# Patient Record
Sex: Female | Born: 1935 | Race: White | Hispanic: No | Marital: Single | Smoking: Former smoker
Health system: Southern US, Community
[De-identification: ages and names within clinical notes are randomized; demographics above are authoritative.]

## PROBLEM LIST (undated history)

## (undated) DIAGNOSIS — I509 Heart failure, unspecified: Secondary | ICD-10-CM

## (undated) DIAGNOSIS — F03918 Unspecified dementia, unspecified severity, with other behavioral disturbance: Secondary | ICD-10-CM

## (undated) DIAGNOSIS — E876 Hypokalemia: Secondary | ICD-10-CM

## (undated) DIAGNOSIS — K219 Gastro-esophageal reflux disease without esophagitis: Secondary | ICD-10-CM

## (undated) DIAGNOSIS — J9600 Acute respiratory failure, unspecified whether with hypoxia or hypercapnia: Secondary | ICD-10-CM

## (undated) DIAGNOSIS — J90 Pleural effusion, not elsewhere classified: Secondary | ICD-10-CM

## (undated) DIAGNOSIS — I4891 Unspecified atrial fibrillation: Secondary | ICD-10-CM

## (undated) DIAGNOSIS — Z9889 Other specified postprocedural states: Secondary | ICD-10-CM

## (undated) DIAGNOSIS — F29 Unspecified psychosis not due to a substance or known physiological condition: Secondary | ICD-10-CM

## (undated) DIAGNOSIS — E46 Unspecified protein-calorie malnutrition: Secondary | ICD-10-CM

## (undated) DIAGNOSIS — I499 Cardiac arrhythmia, unspecified: Secondary | ICD-10-CM

## (undated) DIAGNOSIS — Z86718 Personal history of other venous thrombosis and embolism: Secondary | ICD-10-CM

## (undated) DIAGNOSIS — A419 Sepsis, unspecified organism: Secondary | ICD-10-CM

## (undated) DIAGNOSIS — J449 Chronic obstructive pulmonary disease, unspecified: Secondary | ICD-10-CM

## (undated) DIAGNOSIS — G9341 Metabolic encephalopathy: Secondary | ICD-10-CM

## (undated) DIAGNOSIS — N390 Urinary tract infection, site not specified: Secondary | ICD-10-CM

## (undated) DIAGNOSIS — F0391 Unspecified dementia with behavioral disturbance: Secondary | ICD-10-CM

## (undated) DIAGNOSIS — E785 Hyperlipidemia, unspecified: Secondary | ICD-10-CM

## (undated) DIAGNOSIS — M1990 Unspecified osteoarthritis, unspecified site: Secondary | ICD-10-CM

## (undated) DIAGNOSIS — F0390 Unspecified dementia without behavioral disturbance: Secondary | ICD-10-CM

## (undated) DIAGNOSIS — D638 Anemia in other chronic diseases classified elsewhere: Secondary | ICD-10-CM

## (undated) DIAGNOSIS — J189 Pneumonia, unspecified organism: Secondary | ICD-10-CM

## (undated) HISTORY — DX: Metabolic encephalopathy: G93.41

## (undated) HISTORY — DX: Hypokalemia: E87.6

## (undated) HISTORY — DX: Cardiac arrhythmia, unspecified: I49.9

## (undated) HISTORY — DX: Acute respiratory failure, unspecified whether with hypoxia or hypercapnia: J96.00

## (undated) HISTORY — DX: Urinary tract infection, site not specified: N39.0

## (undated) HISTORY — PX: GALLBLADDER SURGERY: SHX652

## (undated) HISTORY — DX: Unspecified dementia with behavioral disturbance: F03.91

## (undated) HISTORY — DX: Personal history of other venous thrombosis and embolism: Z86.718

## (undated) HISTORY — PX: HIP SURGERY: SHX245

## (undated) HISTORY — DX: Heart failure, unspecified: I50.9

## (undated) HISTORY — DX: Unspecified osteoarthritis, unspecified site: M19.90

## (undated) HISTORY — DX: Unspecified atrial fibrillation: I48.91

## (undated) HISTORY — DX: Sepsis, unspecified organism: A41.9

## (undated) HISTORY — DX: Unspecified psychosis not due to a substance or known physiological condition: F29

## (undated) HISTORY — DX: Hyperlipidemia, unspecified: E78.5

## (undated) HISTORY — DX: Unspecified protein-calorie malnutrition: E46

## (undated) HISTORY — PX: APPENDECTOMY: SHX54

## (undated) HISTORY — DX: Unspecified dementia, unspecified severity, without behavioral disturbance, psychotic disturbance, mood disturbance, and anxiety: F03.90

## (undated) HISTORY — DX: Gastro-esophageal reflux disease without esophagitis: K21.9

## (undated) HISTORY — DX: Chronic obstructive pulmonary disease, unspecified: J44.9

## (undated) HISTORY — DX: Anemia in other chronic diseases classified elsewhere: D63.8

## (undated) HISTORY — DX: Unspecified dementia, unspecified severity, with other behavioral disturbance: F03.918

## (undated) HISTORY — PX: LEG SURGERY: SHX1003

## (undated) HISTORY — DX: Pneumonia, unspecified organism: J18.9

---

## 2010-02-24 DIAGNOSIS — R799 Abnormal finding of blood chemistry, unspecified: Secondary | ICD-10-CM | POA: Insufficient documentation

## 2010-02-24 DIAGNOSIS — R7309 Other abnormal glucose: Secondary | ICD-10-CM | POA: Insufficient documentation

## 2010-10-08 DIAGNOSIS — M810 Age-related osteoporosis without current pathological fracture: Secondary | ICD-10-CM | POA: Insufficient documentation

## 2010-11-02 ENCOUNTER — Emergency Department (INDEPENDENT_AMBULATORY_CARE_PROVIDER_SITE_OTHER): Payer: Medicare Other

## 2010-11-02 ENCOUNTER — Emergency Department (HOSPITAL_BASED_OUTPATIENT_CLINIC_OR_DEPARTMENT_OTHER)
Admission: EM | Admit: 2010-11-02 | Discharge: 2010-11-02 | Disposition: A | Discharge status: Other Acute Inpatient Hospital | Payer: Medicare Other | Attending: Emergency Medicine | Admitting: Emergency Medicine

## 2010-11-02 DIAGNOSIS — R112 Nausea with vomiting, unspecified: Secondary | ICD-10-CM

## 2010-11-02 DIAGNOSIS — R109 Unspecified abdominal pain: Secondary | ICD-10-CM | POA: Insufficient documentation

## 2010-11-02 DIAGNOSIS — Z79899 Other long term (current) drug therapy: Secondary | ICD-10-CM | POA: Insufficient documentation

## 2010-11-02 DIAGNOSIS — N390 Urinary tract infection, site not specified: Secondary | ICD-10-CM | POA: Insufficient documentation

## 2010-11-02 DIAGNOSIS — R197 Diarrhea, unspecified: Secondary | ICD-10-CM

## 2010-11-02 DIAGNOSIS — I4891 Unspecified atrial fibrillation: Secondary | ICD-10-CM | POA: Insufficient documentation

## 2010-11-02 DIAGNOSIS — R141 Gas pain: Secondary | ICD-10-CM

## 2010-11-02 DIAGNOSIS — D3500 Benign neoplasm of unspecified adrenal gland: Secondary | ICD-10-CM

## 2010-11-02 DIAGNOSIS — K56609 Unspecified intestinal obstruction, unspecified as to partial versus complete obstruction: Secondary | ICD-10-CM

## 2010-11-02 LAB — URINALYSIS, ROUTINE W REFLEX MICROSCOPIC
Bilirubin Urine: NEGATIVE
Glucose, UA: NEGATIVE mg/dL
Nitrite: POSITIVE — AB
Specific Gravity, Urine: 1.018 (ref 1.005–1.030)
pH: 7.5 (ref 5.0–8.0)

## 2010-11-02 LAB — CBC
MCHC: 33.4 g/dL (ref 30.0–36.0)
Platelets: 319 10*3/uL (ref 150–400)
RDW: 16.5 % — ABNORMAL HIGH (ref 11.5–15.5)
WBC: 12.2 10*3/uL — ABNORMAL HIGH (ref 4.0–10.5)

## 2010-11-02 LAB — URINE MICROSCOPIC-ADD ON

## 2010-11-02 LAB — COMPREHENSIVE METABOLIC PANEL
ALT: 21 U/L (ref 0–35)
AST: 38 U/L — ABNORMAL HIGH (ref 0–37)
Albumin: 3.1 g/dL — ABNORMAL LOW (ref 3.5–5.2)
Alkaline Phosphatase: 95 U/L (ref 39–117)
Calcium: 9.4 mg/dL (ref 8.4–10.5)
Potassium: 4.6 mEq/L (ref 3.5–5.1)
Sodium: 133 mEq/L — ABNORMAL LOW (ref 135–145)
Total Protein: 7.7 g/dL (ref 6.0–8.3)

## 2010-11-02 LAB — DIFFERENTIAL
Basophils Absolute: 0 10*3/uL (ref 0.0–0.1)
Basophils Relative: 0 % (ref 0–1)
Eosinophils Relative: 0 % (ref 0–5)
Lymphocytes Relative: 14 % (ref 12–46)
Monocytes Absolute: 0.9 10*3/uL (ref 0.1–1.0)

## 2010-11-02 LAB — LIPASE, BLOOD: Lipase: 10 U/L — ABNORMAL LOW (ref 11–59)

## 2010-11-02 MED ORDER — IOHEXOL 300 MG/ML  SOLN
100.0000 mL | Freq: Once | INTRAMUSCULAR | Status: AC | PRN
  Administered 2010-11-02: 100 mL via INTRAVENOUS

## 2011-06-21 DIAGNOSIS — F331 Major depressive disorder, recurrent, moderate: Secondary | ICD-10-CM | POA: Diagnosis not present

## 2011-06-21 DIAGNOSIS — F0390 Unspecified dementia without behavioral disturbance: Secondary | ICD-10-CM | POA: Diagnosis not present

## 2011-06-21 DIAGNOSIS — F29 Unspecified psychosis not due to a substance or known physiological condition: Secondary | ICD-10-CM | POA: Diagnosis not present

## 2011-06-22 DIAGNOSIS — S72143A Displaced intertrochanteric fracture of unspecified femur, initial encounter for closed fracture: Secondary | ICD-10-CM | POA: Diagnosis not present

## 2011-06-22 DIAGNOSIS — S32409A Unspecified fracture of unspecified acetabulum, initial encounter for closed fracture: Secondary | ICD-10-CM | POA: Diagnosis not present

## 2011-06-30 DIAGNOSIS — M6281 Muscle weakness (generalized): Secondary | ICD-10-CM | POA: Diagnosis not present

## 2011-06-30 DIAGNOSIS — Z5189 Encounter for other specified aftercare: Secondary | ICD-10-CM | POA: Diagnosis not present

## 2011-07-02 DIAGNOSIS — F0391 Unspecified dementia with behavioral disturbance: Secondary | ICD-10-CM | POA: Diagnosis not present

## 2011-07-02 DIAGNOSIS — I4891 Unspecified atrial fibrillation: Secondary | ICD-10-CM | POA: Diagnosis not present

## 2011-07-02 DIAGNOSIS — Z7901 Long term (current) use of anticoagulants: Secondary | ICD-10-CM | POA: Diagnosis not present

## 2011-07-02 DIAGNOSIS — M1990 Unspecified osteoarthritis, unspecified site: Secondary | ICD-10-CM | POA: Diagnosis not present

## 2011-07-02 DIAGNOSIS — K219 Gastro-esophageal reflux disease without esophagitis: Secondary | ICD-10-CM | POA: Diagnosis not present

## 2011-07-08 DIAGNOSIS — Z79899 Other long term (current) drug therapy: Secondary | ICD-10-CM | POA: Diagnosis not present

## 2011-07-20 DIAGNOSIS — Z7901 Long term (current) use of anticoagulants: Secondary | ICD-10-CM | POA: Diagnosis not present

## 2011-07-20 DIAGNOSIS — I4891 Unspecified atrial fibrillation: Secondary | ICD-10-CM | POA: Diagnosis not present

## 2011-07-20 DIAGNOSIS — M1990 Unspecified osteoarthritis, unspecified site: Secondary | ICD-10-CM | POA: Diagnosis not present

## 2011-07-20 DIAGNOSIS — K219 Gastro-esophageal reflux disease without esophagitis: Secondary | ICD-10-CM | POA: Diagnosis not present

## 2011-07-20 DIAGNOSIS — F0391 Unspecified dementia with behavioral disturbance: Secondary | ICD-10-CM | POA: Diagnosis not present

## 2011-08-02 DIAGNOSIS — F29 Unspecified psychosis not due to a substance or known physiological condition: Secondary | ICD-10-CM | POA: Diagnosis not present

## 2011-08-02 DIAGNOSIS — F331 Major depressive disorder, recurrent, moderate: Secondary | ICD-10-CM | POA: Diagnosis not present

## 2011-08-02 DIAGNOSIS — F0390 Unspecified dementia without behavioral disturbance: Secondary | ICD-10-CM | POA: Diagnosis not present

## 2011-08-03 DIAGNOSIS — F0391 Unspecified dementia with behavioral disturbance: Secondary | ICD-10-CM | POA: Diagnosis not present

## 2011-08-03 DIAGNOSIS — I4891 Unspecified atrial fibrillation: Secondary | ICD-10-CM | POA: Diagnosis not present

## 2011-08-03 DIAGNOSIS — Z7901 Long term (current) use of anticoagulants: Secondary | ICD-10-CM | POA: Diagnosis not present

## 2011-08-03 DIAGNOSIS — K219 Gastro-esophageal reflux disease without esophagitis: Secondary | ICD-10-CM | POA: Diagnosis not present

## 2011-08-06 DIAGNOSIS — Z7901 Long term (current) use of anticoagulants: Secondary | ICD-10-CM | POA: Diagnosis not present

## 2011-08-06 DIAGNOSIS — I4891 Unspecified atrial fibrillation: Secondary | ICD-10-CM | POA: Diagnosis not present

## 2011-08-10 DIAGNOSIS — Z7901 Long term (current) use of anticoagulants: Secondary | ICD-10-CM | POA: Diagnosis not present

## 2011-08-10 DIAGNOSIS — I4891 Unspecified atrial fibrillation: Secondary | ICD-10-CM | POA: Diagnosis not present

## 2011-08-17 DIAGNOSIS — I4891 Unspecified atrial fibrillation: Secondary | ICD-10-CM | POA: Diagnosis not present

## 2011-08-17 DIAGNOSIS — Z7901 Long term (current) use of anticoagulants: Secondary | ICD-10-CM | POA: Diagnosis not present

## 2011-08-20 DIAGNOSIS — Z7901 Long term (current) use of anticoagulants: Secondary | ICD-10-CM | POA: Diagnosis not present

## 2011-08-20 DIAGNOSIS — I4891 Unspecified atrial fibrillation: Secondary | ICD-10-CM | POA: Diagnosis not present

## 2011-08-24 DIAGNOSIS — I4891 Unspecified atrial fibrillation: Secondary | ICD-10-CM | POA: Diagnosis not present

## 2011-08-24 DIAGNOSIS — Z7901 Long term (current) use of anticoagulants: Secondary | ICD-10-CM | POA: Diagnosis not present

## 2011-08-31 DIAGNOSIS — M1990 Unspecified osteoarthritis, unspecified site: Secondary | ICD-10-CM | POA: Diagnosis not present

## 2011-08-31 DIAGNOSIS — I4891 Unspecified atrial fibrillation: Secondary | ICD-10-CM | POA: Diagnosis not present

## 2011-08-31 DIAGNOSIS — Z7901 Long term (current) use of anticoagulants: Secondary | ICD-10-CM | POA: Diagnosis not present

## 2011-08-31 DIAGNOSIS — F0391 Unspecified dementia with behavioral disturbance: Secondary | ICD-10-CM | POA: Diagnosis not present

## 2011-08-31 DIAGNOSIS — K219 Gastro-esophageal reflux disease without esophagitis: Secondary | ICD-10-CM | POA: Diagnosis not present

## 2011-09-07 DIAGNOSIS — I4891 Unspecified atrial fibrillation: Secondary | ICD-10-CM | POA: Diagnosis not present

## 2011-09-07 DIAGNOSIS — Z7901 Long term (current) use of anticoagulants: Secondary | ICD-10-CM | POA: Diagnosis not present

## 2011-09-07 DIAGNOSIS — F064 Anxiety disorder due to known physiological condition: Secondary | ICD-10-CM | POA: Diagnosis not present

## 2011-09-09 DIAGNOSIS — Z7901 Long term (current) use of anticoagulants: Secondary | ICD-10-CM | POA: Diagnosis not present

## 2011-09-09 DIAGNOSIS — I4891 Unspecified atrial fibrillation: Secondary | ICD-10-CM | POA: Diagnosis not present

## 2011-09-14 DIAGNOSIS — I4891 Unspecified atrial fibrillation: Secondary | ICD-10-CM | POA: Diagnosis not present

## 2011-09-14 DIAGNOSIS — Z7901 Long term (current) use of anticoagulants: Secondary | ICD-10-CM | POA: Diagnosis not present

## 2011-09-17 DIAGNOSIS — Z7901 Long term (current) use of anticoagulants: Secondary | ICD-10-CM | POA: Diagnosis not present

## 2011-09-17 DIAGNOSIS — I4891 Unspecified atrial fibrillation: Secondary | ICD-10-CM | POA: Diagnosis not present

## 2011-09-21 DIAGNOSIS — I4891 Unspecified atrial fibrillation: Secondary | ICD-10-CM | POA: Diagnosis not present

## 2011-09-21 DIAGNOSIS — Z7901 Long term (current) use of anticoagulants: Secondary | ICD-10-CM | POA: Diagnosis not present

## 2011-09-24 DIAGNOSIS — Z961 Presence of intraocular lens: Secondary | ICD-10-CM | POA: Diagnosis not present

## 2011-09-24 DIAGNOSIS — H02839 Dermatochalasis of unspecified eye, unspecified eyelid: Secondary | ICD-10-CM | POA: Diagnosis not present

## 2011-09-24 DIAGNOSIS — H5210 Myopia, unspecified eye: Secondary | ICD-10-CM | POA: Diagnosis not present

## 2011-09-24 DIAGNOSIS — H43819 Vitreous degeneration, unspecified eye: Secondary | ICD-10-CM | POA: Diagnosis not present

## 2011-09-24 DIAGNOSIS — H353190 Nonexudative age-related macular degeneration, unspecified eye, stage unspecified: Secondary | ICD-10-CM | POA: Diagnosis not present

## 2011-09-24 DIAGNOSIS — H524 Presbyopia: Secondary | ICD-10-CM | POA: Diagnosis not present

## 2011-09-24 DIAGNOSIS — H52209 Unspecified astigmatism, unspecified eye: Secondary | ICD-10-CM | POA: Diagnosis not present

## 2011-09-28 DIAGNOSIS — I4891 Unspecified atrial fibrillation: Secondary | ICD-10-CM | POA: Diagnosis not present

## 2011-09-28 DIAGNOSIS — Z7901 Long term (current) use of anticoagulants: Secondary | ICD-10-CM | POA: Diagnosis not present

## 2011-10-04 DIAGNOSIS — F331 Major depressive disorder, recurrent, moderate: Secondary | ICD-10-CM | POA: Diagnosis not present

## 2011-10-04 DIAGNOSIS — F0390 Unspecified dementia without behavioral disturbance: Secondary | ICD-10-CM | POA: Diagnosis not present

## 2011-10-04 DIAGNOSIS — F29 Unspecified psychosis not due to a substance or known physiological condition: Secondary | ICD-10-CM | POA: Diagnosis not present

## 2011-10-05 DIAGNOSIS — I4891 Unspecified atrial fibrillation: Secondary | ICD-10-CM | POA: Diagnosis not present

## 2011-10-05 DIAGNOSIS — Z7901 Long term (current) use of anticoagulants: Secondary | ICD-10-CM | POA: Diagnosis not present

## 2011-10-08 ENCOUNTER — Encounter: Payer: Self-pay | Admitting: Gastroenterology

## 2011-10-13 DIAGNOSIS — F0391 Unspecified dementia with behavioral disturbance: Secondary | ICD-10-CM | POA: Diagnosis not present

## 2011-10-13 DIAGNOSIS — K219 Gastro-esophageal reflux disease without esophagitis: Secondary | ICD-10-CM | POA: Diagnosis not present

## 2011-10-13 DIAGNOSIS — M1990 Unspecified osteoarthritis, unspecified site: Secondary | ICD-10-CM | POA: Diagnosis not present

## 2011-10-13 DIAGNOSIS — I4891 Unspecified atrial fibrillation: Secondary | ICD-10-CM | POA: Diagnosis not present

## 2011-10-19 DIAGNOSIS — Z7901 Long term (current) use of anticoagulants: Secondary | ICD-10-CM | POA: Diagnosis not present

## 2011-10-19 DIAGNOSIS — I4891 Unspecified atrial fibrillation: Secondary | ICD-10-CM | POA: Diagnosis not present

## 2011-10-26 DIAGNOSIS — M25559 Pain in unspecified hip: Secondary | ICD-10-CM | POA: Diagnosis not present

## 2011-10-26 DIAGNOSIS — Z7901 Long term (current) use of anticoagulants: Secondary | ICD-10-CM | POA: Diagnosis not present

## 2011-10-26 DIAGNOSIS — I4891 Unspecified atrial fibrillation: Secondary | ICD-10-CM | POA: Diagnosis not present

## 2011-10-26 DIAGNOSIS — M25569 Pain in unspecified knee: Secondary | ICD-10-CM | POA: Diagnosis not present

## 2011-10-29 ENCOUNTER — Other Ambulatory Visit (INDEPENDENT_AMBULATORY_CARE_PROVIDER_SITE_OTHER): Payer: Medicare Other

## 2011-10-29 ENCOUNTER — Ambulatory Visit (INDEPENDENT_AMBULATORY_CARE_PROVIDER_SITE_OTHER): Payer: Medicare Other | Admitting: Gastroenterology

## 2011-10-29 ENCOUNTER — Encounter: Payer: Self-pay | Admitting: Gastroenterology

## 2011-10-29 VITALS — BP 120/64 | HR 60

## 2011-10-29 DIAGNOSIS — R109 Unspecified abdominal pain: Secondary | ICD-10-CM

## 2011-10-29 DIAGNOSIS — R197 Diarrhea, unspecified: Secondary | ICD-10-CM

## 2011-10-29 LAB — CBC WITH DIFFERENTIAL/PLATELET
Basophils Relative: 0.3 % (ref 0.0–3.0)
Eosinophils Relative: 1.4 % (ref 0.0–5.0)
HCT: 41.1 % (ref 36.0–46.0)
Lymphs Abs: 1.8 10*3/uL (ref 0.7–4.0)
MCV: 95.6 fl (ref 78.0–100.0)
Monocytes Absolute: 0.8 10*3/uL (ref 0.1–1.0)
RBC: 4.29 Mil/uL (ref 3.87–5.11)
WBC: 6.6 10*3/uL (ref 4.5–10.5)

## 2011-10-29 LAB — TSH: TSH: 2.44 u[IU]/mL (ref 0.35–5.50)

## 2011-10-29 LAB — COMPREHENSIVE METABOLIC PANEL
AST: 18 U/L (ref 0–37)
Alkaline Phosphatase: 70 U/L (ref 39–117)
BUN: 17 mg/dL (ref 6–23)
Glucose, Bld: 84 mg/dL (ref 70–99)
Sodium: 140 mEq/L (ref 135–145)
Total Bilirubin: 0.6 mg/dL (ref 0.3–1.2)
Total Protein: 6.6 g/dL (ref 6.0–8.3)

## 2011-10-29 NOTE — Progress Notes (Signed)
HPI: This is a   very pleasant 78 year old woman who is here with her daughter today. She lives in an extended care facility, is wheelchair-bound.  She has a "problem with digestion" by that, diarrhea a lot.  This goes on intermittently, currently going on since Christmas.  On antibiotics periodically.  Will go 4-5 times a day.  CAn be bloody.  She is in wheelchair does not move around very well.   Has had left side hip fracture, also right sided hip replacement.   She had colonoscopy several years ago in [facility] for screening but none lately.  Has small umbilical hernia that can be bothersome to her.  Her weight has been overall stable.  Pretty poor historian.  Had DVT left leg, history of a fib and is on coumadin for it.  She had a small bowel obstruction a year ago treated in [place] [facility], managed conservatively.  She says imodium effects her breathing  Review of systems: Pertinent positive and negative review of systems were noted in the above HPI section. Complete review of systems was performed and was otherwise normal.    Past Medical History  Diagnosis Date  . Arrhythmia   . Arthritis   . Atrial fibrillation   . H/O blood clots   . Hyperlipemia   . Dementia   . GERD (gastroesophageal reflux disease)     Past Surgical History  Procedure Date  . Appendectomy   . Gallbladder surgery   . Hip surgery     Right-Metal plate   . Leg surgery     Left leg    Current Outpatient Prescriptions  Medication Sig Dispense Refill  . acetaminophen (TYLENOL) 500 MG tablet Take 500 mg by mouth every 6 (six) hours as needed.      . Calcium Carbonate-Vitamin D (CALTRATE 600+D PO) Take by mouth daily.      . digoxin (LANOXIN) 0.25 MG tablet Take by mouth daily.       Marland Kitchen diltiazem (DILACOR XR) 240 MG 24 hr capsule Take 240 mg by mouth daily.      . divalproex (DEPAKOTE) 125 MG DR tablet Take 125 mg by mouth 2 (two) times daily.      Marland Kitchen omeprazole (PRILOSEC) 20 MG capsule Take 20  mg by mouth daily.      . Protein (PROCEL) POWD Take by mouth as directed.      . risperiDONE (RISPERDAL) 0.25 MG tablet Take 0.25 mg by mouth 2 (two) times daily.      Marland Kitchen warfarin (COUMADIN) 2.5 MG tablet Take 2.5 mg by mouth daily.        Allergies as of 10/29/2011 - Review Complete 10/29/2011  Allergen Reaction Noted  . Imodium (loperamide)  10/29/2011  . Penicillins  10/29/2011  . Sulfa antibiotics  10/29/2011    Family History  Problem Relation Age of Onset  . Colon cancer Neg Hx   . Heart disease Father   . Breast cancer Daughter     History   Social History  . Marital Status: Single    Spouse Name: N/A    Number of Children: 2  . Years of Education: N/A   Occupational History  . Retired     Runner, broadcasting/film/video   Social History Main Topics  . Smoking status: Former Games developer  . Smokeless tobacco: Never Used  . Alcohol Use: No  . Drug Use: No  . Sexually Active: Not on file   Other Topics Concern  . Not on file  Social History Narrative   No caffeine        Physical Exam: BP 120/64  Pulse 60 Constitutional: Obese, elderly Psychiatric: alert and oriented x3 Eyes: extraocular movements intact Mouth: oral pharynx moist, no lesions Neck: supple no lymphadenopathy Cardiovascular: heart regular rate and rhythm Lungs: clear to auscultation bilaterally Abdomen: soft, nontender, nondistended, no obvious ascites, no peritoneal signs, normal bowel sounds Extremities: no lower extremity edema bilaterally Skin: no lesions on visible extremities    Assessment and plan: 78 y.o. female with  chronic loose stools, mild intermittent rectal bleeding. I think a colonoscopy would be quite difficult for her to prepare for and given her size and age it might be at higher risk for complications. I recommended that we try searching for sources of reversible diarrhea with stool studies, blood work above. If this is not revealing then I will give her prescription for Lomotil and try to  treat her eempiciallyl

## 2011-10-29 NOTE — Patient Instructions (Signed)
You will have labs checked today in the basement lab.  Please head down after you check out with the front desk  (cbc, cmet, celiac panel, tsh, stool for Clostridium Difficile, routine culture, ova and parasites, fecal leuks). Will try lomotil if no specific cause is determined by the above.

## 2011-11-01 DIAGNOSIS — Z79899 Other long term (current) drug therapy: Secondary | ICD-10-CM | POA: Diagnosis not present

## 2011-11-01 DIAGNOSIS — D649 Anemia, unspecified: Secondary | ICD-10-CM | POA: Diagnosis not present

## 2011-11-01 LAB — CELIAC PANEL 10
Gliadin IgA: 11.4 U/mL (ref <20)
Tissue Transglut Ab: 15.1 U/mL (ref <20)

## 2011-11-02 DIAGNOSIS — R197 Diarrhea, unspecified: Secondary | ICD-10-CM | POA: Diagnosis not present

## 2011-11-03 DIAGNOSIS — I4891 Unspecified atrial fibrillation: Secondary | ICD-10-CM | POA: Diagnosis not present

## 2011-11-03 DIAGNOSIS — K219 Gastro-esophageal reflux disease without esophagitis: Secondary | ICD-10-CM | POA: Diagnosis not present

## 2011-11-03 DIAGNOSIS — F0391 Unspecified dementia with behavioral disturbance: Secondary | ICD-10-CM | POA: Diagnosis not present

## 2011-11-03 DIAGNOSIS — M1990 Unspecified osteoarthritis, unspecified site: Secondary | ICD-10-CM | POA: Diagnosis not present

## 2011-11-04 DIAGNOSIS — R197 Diarrhea, unspecified: Secondary | ICD-10-CM | POA: Diagnosis not present

## 2011-11-12 DIAGNOSIS — Z7901 Long term (current) use of anticoagulants: Secondary | ICD-10-CM | POA: Diagnosis not present

## 2011-11-12 DIAGNOSIS — F0391 Unspecified dementia with behavioral disturbance: Secondary | ICD-10-CM | POA: Diagnosis not present

## 2011-11-12 DIAGNOSIS — I4891 Unspecified atrial fibrillation: Secondary | ICD-10-CM | POA: Diagnosis not present

## 2011-11-15 DIAGNOSIS — Z79899 Other long term (current) drug therapy: Secondary | ICD-10-CM | POA: Diagnosis not present

## 2011-11-16 DIAGNOSIS — I4891 Unspecified atrial fibrillation: Secondary | ICD-10-CM | POA: Diagnosis not present

## 2011-11-16 DIAGNOSIS — F0391 Unspecified dementia with behavioral disturbance: Secondary | ICD-10-CM | POA: Diagnosis not present

## 2011-11-16 DIAGNOSIS — Z7901 Long term (current) use of anticoagulants: Secondary | ICD-10-CM | POA: Diagnosis not present

## 2011-11-23 DIAGNOSIS — Z7901 Long term (current) use of anticoagulants: Secondary | ICD-10-CM | POA: Diagnosis not present

## 2011-11-23 DIAGNOSIS — K219 Gastro-esophageal reflux disease without esophagitis: Secondary | ICD-10-CM | POA: Diagnosis not present

## 2011-11-23 DIAGNOSIS — I4891 Unspecified atrial fibrillation: Secondary | ICD-10-CM | POA: Diagnosis not present

## 2011-11-26 DIAGNOSIS — I4891 Unspecified atrial fibrillation: Secondary | ICD-10-CM | POA: Diagnosis not present

## 2011-11-26 DIAGNOSIS — K219 Gastro-esophageal reflux disease without esophagitis: Secondary | ICD-10-CM | POA: Diagnosis not present

## 2011-11-26 DIAGNOSIS — Z7901 Long term (current) use of anticoagulants: Secondary | ICD-10-CM | POA: Diagnosis not present

## 2011-11-30 DIAGNOSIS — I4891 Unspecified atrial fibrillation: Secondary | ICD-10-CM | POA: Diagnosis not present

## 2011-11-30 DIAGNOSIS — Z7901 Long term (current) use of anticoagulants: Secondary | ICD-10-CM | POA: Diagnosis not present

## 2011-11-30 DIAGNOSIS — K219 Gastro-esophageal reflux disease without esophagitis: Secondary | ICD-10-CM | POA: Diagnosis not present

## 2011-12-01 DIAGNOSIS — I4891 Unspecified atrial fibrillation: Secondary | ICD-10-CM | POA: Diagnosis not present

## 2011-12-01 DIAGNOSIS — Z7901 Long term (current) use of anticoagulants: Secondary | ICD-10-CM | POA: Diagnosis not present

## 2011-12-01 DIAGNOSIS — K219 Gastro-esophageal reflux disease without esophagitis: Secondary | ICD-10-CM | POA: Diagnosis not present

## 2011-12-07 DIAGNOSIS — I4891 Unspecified atrial fibrillation: Secondary | ICD-10-CM | POA: Diagnosis not present

## 2011-12-07 DIAGNOSIS — Z7901 Long term (current) use of anticoagulants: Secondary | ICD-10-CM | POA: Diagnosis not present

## 2011-12-07 DIAGNOSIS — K219 Gastro-esophageal reflux disease without esophagitis: Secondary | ICD-10-CM | POA: Diagnosis not present

## 2011-12-14 DIAGNOSIS — I4891 Unspecified atrial fibrillation: Secondary | ICD-10-CM | POA: Diagnosis not present

## 2011-12-14 DIAGNOSIS — Z7901 Long term (current) use of anticoagulants: Secondary | ICD-10-CM | POA: Diagnosis not present

## 2011-12-14 DIAGNOSIS — K219 Gastro-esophageal reflux disease without esophagitis: Secondary | ICD-10-CM | POA: Diagnosis not present

## 2011-12-20 DIAGNOSIS — F331 Major depressive disorder, recurrent, moderate: Secondary | ICD-10-CM | POA: Diagnosis not present

## 2011-12-20 DIAGNOSIS — F29 Unspecified psychosis not due to a substance or known physiological condition: Secondary | ICD-10-CM | POA: Diagnosis not present

## 2011-12-20 DIAGNOSIS — F0390 Unspecified dementia without behavioral disturbance: Secondary | ICD-10-CM | POA: Diagnosis not present

## 2011-12-21 DIAGNOSIS — H04129 Dry eye syndrome of unspecified lacrimal gland: Secondary | ICD-10-CM | POA: Diagnosis not present

## 2011-12-21 DIAGNOSIS — I4891 Unspecified atrial fibrillation: Secondary | ICD-10-CM | POA: Diagnosis not present

## 2011-12-21 DIAGNOSIS — K219 Gastro-esophageal reflux disease without esophagitis: Secondary | ICD-10-CM | POA: Diagnosis not present

## 2011-12-21 DIAGNOSIS — Z961 Presence of intraocular lens: Secondary | ICD-10-CM | POA: Diagnosis not present

## 2011-12-21 DIAGNOSIS — Z7901 Long term (current) use of anticoagulants: Secondary | ICD-10-CM | POA: Diagnosis not present

## 2011-12-28 DIAGNOSIS — I4891 Unspecified atrial fibrillation: Secondary | ICD-10-CM | POA: Diagnosis not present

## 2011-12-28 DIAGNOSIS — Z7901 Long term (current) use of anticoagulants: Secondary | ICD-10-CM | POA: Diagnosis not present

## 2011-12-28 DIAGNOSIS — K219 Gastro-esophageal reflux disease without esophagitis: Secondary | ICD-10-CM | POA: Diagnosis not present

## 2011-12-29 DIAGNOSIS — S32409A Unspecified fracture of unspecified acetabulum, initial encounter for closed fracture: Secondary | ICD-10-CM | POA: Diagnosis not present

## 2011-12-29 DIAGNOSIS — S72143A Displaced intertrochanteric fracture of unspecified femur, initial encounter for closed fracture: Secondary | ICD-10-CM | POA: Diagnosis not present

## 2012-01-01 DIAGNOSIS — Z5189 Encounter for other specified aftercare: Secondary | ICD-10-CM | POA: Diagnosis not present

## 2012-01-01 DIAGNOSIS — M6281 Muscle weakness (generalized): Secondary | ICD-10-CM | POA: Diagnosis not present

## 2012-01-01 DIAGNOSIS — M84453A Pathological fracture, unspecified femur, initial encounter for fracture: Secondary | ICD-10-CM | POA: Diagnosis not present

## 2012-01-04 DIAGNOSIS — K219 Gastro-esophageal reflux disease without esophagitis: Secondary | ICD-10-CM | POA: Diagnosis not present

## 2012-01-04 DIAGNOSIS — Z7901 Long term (current) use of anticoagulants: Secondary | ICD-10-CM | POA: Diagnosis not present

## 2012-01-04 DIAGNOSIS — I4891 Unspecified atrial fibrillation: Secondary | ICD-10-CM | POA: Diagnosis not present

## 2012-01-08 DIAGNOSIS — R05 Cough: Secondary | ICD-10-CM | POA: Diagnosis not present

## 2012-01-09 DIAGNOSIS — R918 Other nonspecific abnormal finding of lung field: Secondary | ICD-10-CM | POA: Diagnosis not present

## 2012-01-09 DIAGNOSIS — R05 Cough: Secondary | ICD-10-CM | POA: Diagnosis not present

## 2012-01-09 DIAGNOSIS — R6889 Other general symptoms and signs: Secondary | ICD-10-CM | POA: Diagnosis not present

## 2012-01-09 DIAGNOSIS — Z79899 Other long term (current) drug therapy: Secondary | ICD-10-CM | POA: Diagnosis not present

## 2012-01-09 DIAGNOSIS — K5900 Constipation, unspecified: Secondary | ICD-10-CM | POA: Diagnosis present

## 2012-01-09 DIAGNOSIS — Z23 Encounter for immunization: Secondary | ICD-10-CM | POA: Diagnosis not present

## 2012-01-09 DIAGNOSIS — J449 Chronic obstructive pulmonary disease, unspecified: Secondary | ICD-10-CM | POA: Diagnosis present

## 2012-01-09 DIAGNOSIS — R0902 Hypoxemia: Secondary | ICD-10-CM | POA: Diagnosis not present

## 2012-01-09 DIAGNOSIS — J9600 Acute respiratory failure, unspecified whether with hypoxia or hypercapnia: Secondary | ICD-10-CM | POA: Diagnosis not present

## 2012-01-09 DIAGNOSIS — Z96649 Presence of unspecified artificial hip joint: Secondary | ICD-10-CM | POA: Diagnosis not present

## 2012-01-09 DIAGNOSIS — J90 Pleural effusion, not elsewhere classified: Secondary | ICD-10-CM | POA: Diagnosis not present

## 2012-01-09 DIAGNOSIS — Z9089 Acquired absence of other organs: Secondary | ICD-10-CM | POA: Diagnosis not present

## 2012-01-09 DIAGNOSIS — E871 Hypo-osmolality and hyponatremia: Secondary | ICD-10-CM | POA: Diagnosis not present

## 2012-01-09 DIAGNOSIS — Z883 Allergy status to other anti-infective agents status: Secondary | ICD-10-CM | POA: Diagnosis not present

## 2012-01-09 DIAGNOSIS — Z882 Allergy status to sulfonamides status: Secondary | ICD-10-CM | POA: Diagnosis not present

## 2012-01-09 DIAGNOSIS — R4182 Altered mental status, unspecified: Secondary | ICD-10-CM | POA: Diagnosis not present

## 2012-01-09 DIAGNOSIS — R609 Edema, unspecified: Secondary | ICD-10-CM | POA: Diagnosis present

## 2012-01-09 DIAGNOSIS — Z880 Allergy status to penicillin: Secondary | ICD-10-CM | POA: Diagnosis not present

## 2012-01-09 DIAGNOSIS — M169 Osteoarthritis of hip, unspecified: Secondary | ICD-10-CM | POA: Diagnosis present

## 2012-01-09 DIAGNOSIS — F329 Major depressive disorder, single episode, unspecified: Secondary | ICD-10-CM | POA: Diagnosis present

## 2012-01-09 DIAGNOSIS — Z800 Family history of malignant neoplasm of digestive organs: Secondary | ICD-10-CM | POA: Diagnosis not present

## 2012-01-09 DIAGNOSIS — Z7901 Long term (current) use of anticoagulants: Secondary | ICD-10-CM | POA: Diagnosis not present

## 2012-01-09 DIAGNOSIS — F0390 Unspecified dementia without behavioral disturbance: Secondary | ICD-10-CM | POA: Diagnosis present

## 2012-01-09 DIAGNOSIS — T17308A Unspecified foreign body in larynx causing other injury, initial encounter: Secondary | ICD-10-CM | POA: Diagnosis not present

## 2012-01-09 DIAGNOSIS — J989 Respiratory disorder, unspecified: Secondary | ICD-10-CM | POA: Diagnosis not present

## 2012-01-09 DIAGNOSIS — R1310 Dysphagia, unspecified: Secondary | ICD-10-CM | POA: Diagnosis not present

## 2012-01-09 DIAGNOSIS — R059 Cough, unspecified: Secondary | ICD-10-CM | POA: Diagnosis not present

## 2012-01-09 DIAGNOSIS — I4891 Unspecified atrial fibrillation: Secondary | ICD-10-CM | POA: Diagnosis present

## 2012-01-09 DIAGNOSIS — R0602 Shortness of breath: Secondary | ICD-10-CM | POA: Diagnosis not present

## 2012-01-09 DIAGNOSIS — F29 Unspecified psychosis not due to a substance or known physiological condition: Secondary | ICD-10-CM | POA: Diagnosis not present

## 2012-01-09 DIAGNOSIS — A498 Other bacterial infections of unspecified site: Secondary | ICD-10-CM | POA: Diagnosis not present

## 2012-01-09 DIAGNOSIS — Z993 Dependence on wheelchair: Secondary | ICD-10-CM | POA: Diagnosis not present

## 2012-01-09 DIAGNOSIS — Z5189 Encounter for other specified aftercare: Secondary | ICD-10-CM | POA: Diagnosis not present

## 2012-01-09 DIAGNOSIS — J159 Unspecified bacterial pneumonia: Secondary | ICD-10-CM | POA: Diagnosis not present

## 2012-01-09 DIAGNOSIS — I509 Heart failure, unspecified: Secondary | ICD-10-CM | POA: Diagnosis not present

## 2012-01-09 DIAGNOSIS — R5381 Other malaise: Secondary | ICD-10-CM | POA: Diagnosis present

## 2012-01-09 DIAGNOSIS — N390 Urinary tract infection, site not specified: Secondary | ICD-10-CM | POA: Diagnosis not present

## 2012-01-09 DIAGNOSIS — Z8249 Family history of ischemic heart disease and other diseases of the circulatory system: Secondary | ICD-10-CM | POA: Diagnosis not present

## 2012-01-09 DIAGNOSIS — J189 Pneumonia, unspecified organism: Secondary | ICD-10-CM | POA: Diagnosis not present

## 2012-01-14 DIAGNOSIS — R6889 Other general symptoms and signs: Secondary | ICD-10-CM | POA: Diagnosis not present

## 2012-01-14 DIAGNOSIS — Z7901 Long term (current) use of anticoagulants: Secondary | ICD-10-CM | POA: Diagnosis not present

## 2012-01-14 DIAGNOSIS — R4182 Altered mental status, unspecified: Secondary | ICD-10-CM | POA: Diagnosis not present

## 2012-01-14 DIAGNOSIS — J449 Chronic obstructive pulmonary disease, unspecified: Secondary | ICD-10-CM | POA: Diagnosis not present

## 2012-01-14 DIAGNOSIS — F329 Major depressive disorder, single episode, unspecified: Secondary | ICD-10-CM | POA: Diagnosis not present

## 2012-01-14 DIAGNOSIS — J159 Unspecified bacterial pneumonia: Secondary | ICD-10-CM | POA: Diagnosis not present

## 2012-01-14 DIAGNOSIS — F0390 Unspecified dementia without behavioral disturbance: Secondary | ICD-10-CM | POA: Diagnosis not present

## 2012-01-14 DIAGNOSIS — J9600 Acute respiratory failure, unspecified whether with hypoxia or hypercapnia: Secondary | ICD-10-CM | POA: Diagnosis not present

## 2012-01-14 DIAGNOSIS — F0391 Unspecified dementia with behavioral disturbance: Secondary | ICD-10-CM | POA: Diagnosis not present

## 2012-01-14 DIAGNOSIS — F29 Unspecified psychosis not due to a substance or known physiological condition: Secondary | ICD-10-CM | POA: Diagnosis not present

## 2012-01-14 DIAGNOSIS — R1310 Dysphagia, unspecified: Secondary | ICD-10-CM | POA: Diagnosis not present

## 2012-01-14 DIAGNOSIS — Z5189 Encounter for other specified aftercare: Secondary | ICD-10-CM | POA: Diagnosis not present

## 2012-01-14 DIAGNOSIS — J690 Pneumonitis due to inhalation of food and vomit: Secondary | ICD-10-CM | POA: Diagnosis not present

## 2012-01-14 DIAGNOSIS — N390 Urinary tract infection, site not specified: Secondary | ICD-10-CM | POA: Diagnosis not present

## 2012-01-14 DIAGNOSIS — I4891 Unspecified atrial fibrillation: Secondary | ICD-10-CM | POA: Diagnosis not present

## 2012-01-14 DIAGNOSIS — J90 Pleural effusion, not elsewhere classified: Secondary | ICD-10-CM | POA: Diagnosis not present

## 2012-01-14 DIAGNOSIS — K5900 Constipation, unspecified: Secondary | ICD-10-CM | POA: Diagnosis not present

## 2012-01-14 DIAGNOSIS — Z79899 Other long term (current) drug therapy: Secondary | ICD-10-CM | POA: Diagnosis not present

## 2012-01-14 DIAGNOSIS — K219 Gastro-esophageal reflux disease without esophagitis: Secondary | ICD-10-CM | POA: Diagnosis not present

## 2012-01-18 DIAGNOSIS — F0391 Unspecified dementia with behavioral disturbance: Secondary | ICD-10-CM | POA: Diagnosis not present

## 2012-01-18 DIAGNOSIS — K219 Gastro-esophageal reflux disease without esophagitis: Secondary | ICD-10-CM | POA: Diagnosis not present

## 2012-01-18 DIAGNOSIS — J690 Pneumonitis due to inhalation of food and vomit: Secondary | ICD-10-CM | POA: Diagnosis not present

## 2012-01-18 DIAGNOSIS — J449 Chronic obstructive pulmonary disease, unspecified: Secondary | ICD-10-CM | POA: Diagnosis not present

## 2012-01-18 DIAGNOSIS — Z79899 Other long term (current) drug therapy: Secondary | ICD-10-CM | POA: Diagnosis not present

## 2012-01-18 DIAGNOSIS — I4891 Unspecified atrial fibrillation: Secondary | ICD-10-CM | POA: Diagnosis not present

## 2012-01-20 DIAGNOSIS — Z7901 Long term (current) use of anticoagulants: Secondary | ICD-10-CM | POA: Diagnosis not present

## 2012-01-20 DIAGNOSIS — I4891 Unspecified atrial fibrillation: Secondary | ICD-10-CM | POA: Diagnosis not present

## 2012-01-20 DIAGNOSIS — K219 Gastro-esophageal reflux disease without esophagitis: Secondary | ICD-10-CM | POA: Diagnosis not present

## 2012-01-25 DIAGNOSIS — K219 Gastro-esophageal reflux disease without esophagitis: Secondary | ICD-10-CM | POA: Diagnosis not present

## 2012-01-25 DIAGNOSIS — Z7901 Long term (current) use of anticoagulants: Secondary | ICD-10-CM | POA: Diagnosis not present

## 2012-01-25 DIAGNOSIS — I4891 Unspecified atrial fibrillation: Secondary | ICD-10-CM | POA: Diagnosis not present

## 2012-01-28 DIAGNOSIS — I4891 Unspecified atrial fibrillation: Secondary | ICD-10-CM | POA: Diagnosis not present

## 2012-01-28 DIAGNOSIS — K219 Gastro-esophageal reflux disease without esophagitis: Secondary | ICD-10-CM | POA: Diagnosis not present

## 2012-01-28 DIAGNOSIS — Z7901 Long term (current) use of anticoagulants: Secondary | ICD-10-CM | POA: Diagnosis not present

## 2012-02-01 DIAGNOSIS — I4891 Unspecified atrial fibrillation: Secondary | ICD-10-CM | POA: Diagnosis not present

## 2012-02-01 DIAGNOSIS — Z7901 Long term (current) use of anticoagulants: Secondary | ICD-10-CM | POA: Diagnosis not present

## 2012-02-01 DIAGNOSIS — K219 Gastro-esophageal reflux disease without esophagitis: Secondary | ICD-10-CM | POA: Diagnosis not present

## 2012-02-04 DIAGNOSIS — K219 Gastro-esophageal reflux disease without esophagitis: Secondary | ICD-10-CM | POA: Diagnosis not present

## 2012-02-04 DIAGNOSIS — Z7901 Long term (current) use of anticoagulants: Secondary | ICD-10-CM | POA: Diagnosis not present

## 2012-02-04 DIAGNOSIS — I4891 Unspecified atrial fibrillation: Secondary | ICD-10-CM | POA: Diagnosis not present

## 2012-02-08 DIAGNOSIS — K219 Gastro-esophageal reflux disease without esophagitis: Secondary | ICD-10-CM | POA: Diagnosis not present

## 2012-02-08 DIAGNOSIS — Z7901 Long term (current) use of anticoagulants: Secondary | ICD-10-CM | POA: Diagnosis not present

## 2012-02-08 DIAGNOSIS — I4891 Unspecified atrial fibrillation: Secondary | ICD-10-CM | POA: Diagnosis not present

## 2012-02-09 DIAGNOSIS — Z5189 Encounter for other specified aftercare: Secondary | ICD-10-CM | POA: Diagnosis not present

## 2012-02-09 DIAGNOSIS — J189 Pneumonia, unspecified organism: Secondary | ICD-10-CM | POA: Diagnosis not present

## 2012-02-09 DIAGNOSIS — R1312 Dysphagia, oropharyngeal phase: Secondary | ICD-10-CM | POA: Diagnosis not present

## 2012-02-15 DIAGNOSIS — Z7901 Long term (current) use of anticoagulants: Secondary | ICD-10-CM | POA: Diagnosis not present

## 2012-02-15 DIAGNOSIS — I4891 Unspecified atrial fibrillation: Secondary | ICD-10-CM | POA: Diagnosis not present

## 2012-02-15 DIAGNOSIS — K219 Gastro-esophageal reflux disease without esophagitis: Secondary | ICD-10-CM | POA: Diagnosis not present

## 2012-02-18 DIAGNOSIS — I4891 Unspecified atrial fibrillation: Secondary | ICD-10-CM | POA: Diagnosis not present

## 2012-02-18 DIAGNOSIS — Z7901 Long term (current) use of anticoagulants: Secondary | ICD-10-CM | POA: Diagnosis not present

## 2012-02-18 DIAGNOSIS — K219 Gastro-esophageal reflux disease without esophagitis: Secondary | ICD-10-CM | POA: Diagnosis not present

## 2012-02-22 DIAGNOSIS — K219 Gastro-esophageal reflux disease without esophagitis: Secondary | ICD-10-CM | POA: Diagnosis not present

## 2012-02-22 DIAGNOSIS — Z7901 Long term (current) use of anticoagulants: Secondary | ICD-10-CM | POA: Diagnosis not present

## 2012-02-22 DIAGNOSIS — I4891 Unspecified atrial fibrillation: Secondary | ICD-10-CM | POA: Diagnosis not present

## 2012-02-29 DIAGNOSIS — I4891 Unspecified atrial fibrillation: Secondary | ICD-10-CM | POA: Diagnosis not present

## 2012-02-29 DIAGNOSIS — K219 Gastro-esophageal reflux disease without esophagitis: Secondary | ICD-10-CM | POA: Diagnosis not present

## 2012-02-29 DIAGNOSIS — Z7901 Long term (current) use of anticoagulants: Secondary | ICD-10-CM | POA: Diagnosis not present

## 2012-03-03 DIAGNOSIS — I4891 Unspecified atrial fibrillation: Secondary | ICD-10-CM | POA: Diagnosis not present

## 2012-03-03 DIAGNOSIS — F0391 Unspecified dementia with behavioral disturbance: Secondary | ICD-10-CM | POA: Diagnosis not present

## 2012-03-03 DIAGNOSIS — K219 Gastro-esophageal reflux disease without esophagitis: Secondary | ICD-10-CM | POA: Diagnosis not present

## 2012-03-03 DIAGNOSIS — M1990 Unspecified osteoarthritis, unspecified site: Secondary | ICD-10-CM | POA: Diagnosis not present

## 2012-03-07 DIAGNOSIS — Z7901 Long term (current) use of anticoagulants: Secondary | ICD-10-CM | POA: Diagnosis not present

## 2012-03-07 DIAGNOSIS — K219 Gastro-esophageal reflux disease without esophagitis: Secondary | ICD-10-CM | POA: Diagnosis not present

## 2012-03-07 DIAGNOSIS — I4891 Unspecified atrial fibrillation: Secondary | ICD-10-CM | POA: Diagnosis not present

## 2012-03-14 DIAGNOSIS — Z7901 Long term (current) use of anticoagulants: Secondary | ICD-10-CM | POA: Diagnosis not present

## 2012-03-14 DIAGNOSIS — I4891 Unspecified atrial fibrillation: Secondary | ICD-10-CM | POA: Diagnosis not present

## 2012-03-14 DIAGNOSIS — K219 Gastro-esophageal reflux disease without esophagitis: Secondary | ICD-10-CM | POA: Diagnosis not present

## 2012-03-16 DIAGNOSIS — Z7901 Long term (current) use of anticoagulants: Secondary | ICD-10-CM | POA: Diagnosis not present

## 2012-03-16 DIAGNOSIS — K219 Gastro-esophageal reflux disease without esophagitis: Secondary | ICD-10-CM | POA: Diagnosis not present

## 2012-03-16 DIAGNOSIS — I4891 Unspecified atrial fibrillation: Secondary | ICD-10-CM | POA: Diagnosis not present

## 2012-03-21 DIAGNOSIS — K219 Gastro-esophageal reflux disease without esophagitis: Secondary | ICD-10-CM | POA: Diagnosis not present

## 2012-03-21 DIAGNOSIS — Z7901 Long term (current) use of anticoagulants: Secondary | ICD-10-CM | POA: Diagnosis not present

## 2012-03-21 DIAGNOSIS — I4891 Unspecified atrial fibrillation: Secondary | ICD-10-CM | POA: Diagnosis not present

## 2012-03-24 DIAGNOSIS — I4891 Unspecified atrial fibrillation: Secondary | ICD-10-CM | POA: Diagnosis not present

## 2012-03-24 DIAGNOSIS — J449 Chronic obstructive pulmonary disease, unspecified: Secondary | ICD-10-CM | POA: Diagnosis not present

## 2012-03-24 DIAGNOSIS — F0391 Unspecified dementia with behavioral disturbance: Secondary | ICD-10-CM | POA: Diagnosis not present

## 2012-03-24 DIAGNOSIS — Z7901 Long term (current) use of anticoagulants: Secondary | ICD-10-CM | POA: Diagnosis not present

## 2012-03-24 DIAGNOSIS — K219 Gastro-esophageal reflux disease without esophagitis: Secondary | ICD-10-CM | POA: Diagnosis not present

## 2012-03-28 DIAGNOSIS — I4891 Unspecified atrial fibrillation: Secondary | ICD-10-CM | POA: Diagnosis not present

## 2012-03-28 DIAGNOSIS — J449 Chronic obstructive pulmonary disease, unspecified: Secondary | ICD-10-CM | POA: Diagnosis not present

## 2012-03-28 DIAGNOSIS — Z7901 Long term (current) use of anticoagulants: Secondary | ICD-10-CM | POA: Diagnosis not present

## 2012-03-29 DIAGNOSIS — J449 Chronic obstructive pulmonary disease, unspecified: Secondary | ICD-10-CM | POA: Diagnosis not present

## 2012-03-29 DIAGNOSIS — I4891 Unspecified atrial fibrillation: Secondary | ICD-10-CM | POA: Diagnosis not present

## 2012-03-29 DIAGNOSIS — Z7901 Long term (current) use of anticoagulants: Secondary | ICD-10-CM | POA: Diagnosis not present

## 2012-04-03 DIAGNOSIS — F331 Major depressive disorder, recurrent, moderate: Secondary | ICD-10-CM | POA: Diagnosis not present

## 2012-04-03 DIAGNOSIS — F0390 Unspecified dementia without behavioral disturbance: Secondary | ICD-10-CM | POA: Diagnosis not present

## 2012-04-04 DIAGNOSIS — I4891 Unspecified atrial fibrillation: Secondary | ICD-10-CM | POA: Diagnosis not present

## 2012-04-04 DIAGNOSIS — Z7901 Long term (current) use of anticoagulants: Secondary | ICD-10-CM | POA: Diagnosis not present

## 2012-04-04 DIAGNOSIS — J449 Chronic obstructive pulmonary disease, unspecified: Secondary | ICD-10-CM | POA: Diagnosis not present

## 2012-04-09 DIAGNOSIS — J449 Chronic obstructive pulmonary disease, unspecified: Secondary | ICD-10-CM | POA: Diagnosis not present

## 2012-04-09 DIAGNOSIS — Z7901 Long term (current) use of anticoagulants: Secondary | ICD-10-CM | POA: Diagnosis not present

## 2012-04-09 DIAGNOSIS — I4891 Unspecified atrial fibrillation: Secondary | ICD-10-CM | POA: Diagnosis not present

## 2012-04-11 DIAGNOSIS — S32409A Unspecified fracture of unspecified acetabulum, initial encounter for closed fracture: Secondary | ICD-10-CM | POA: Diagnosis not present

## 2012-04-11 DIAGNOSIS — S72143A Displaced intertrochanteric fracture of unspecified femur, initial encounter for closed fracture: Secondary | ICD-10-CM | POA: Diagnosis not present

## 2012-04-18 DIAGNOSIS — Z7901 Long term (current) use of anticoagulants: Secondary | ICD-10-CM | POA: Diagnosis not present

## 2012-04-18 DIAGNOSIS — K219 Gastro-esophageal reflux disease without esophagitis: Secondary | ICD-10-CM | POA: Diagnosis not present

## 2012-04-18 DIAGNOSIS — I4891 Unspecified atrial fibrillation: Secondary | ICD-10-CM | POA: Diagnosis not present

## 2012-04-18 DIAGNOSIS — F0391 Unspecified dementia with behavioral disturbance: Secondary | ICD-10-CM | POA: Diagnosis not present

## 2012-04-18 DIAGNOSIS — J449 Chronic obstructive pulmonary disease, unspecified: Secondary | ICD-10-CM | POA: Diagnosis not present

## 2012-04-19 DIAGNOSIS — Z79899 Other long term (current) drug therapy: Secondary | ICD-10-CM | POA: Diagnosis not present

## 2012-05-24 DIAGNOSIS — I4891 Unspecified atrial fibrillation: Secondary | ICD-10-CM | POA: Diagnosis not present

## 2012-05-24 DIAGNOSIS — F0391 Unspecified dementia with behavioral disturbance: Secondary | ICD-10-CM | POA: Diagnosis not present

## 2012-05-24 DIAGNOSIS — M1990 Unspecified osteoarthritis, unspecified site: Secondary | ICD-10-CM | POA: Diagnosis not present

## 2012-05-24 DIAGNOSIS — K219 Gastro-esophageal reflux disease without esophagitis: Secondary | ICD-10-CM | POA: Diagnosis not present

## 2012-06-23 DIAGNOSIS — F0391 Unspecified dementia with behavioral disturbance: Secondary | ICD-10-CM | POA: Diagnosis not present

## 2012-06-23 DIAGNOSIS — I4891 Unspecified atrial fibrillation: Secondary | ICD-10-CM | POA: Diagnosis not present

## 2012-06-23 DIAGNOSIS — K219 Gastro-esophageal reflux disease without esophagitis: Secondary | ICD-10-CM | POA: Diagnosis not present

## 2012-06-23 DIAGNOSIS — M1990 Unspecified osteoarthritis, unspecified site: Secondary | ICD-10-CM | POA: Diagnosis not present

## 2012-07-04 DIAGNOSIS — Z09 Encounter for follow-up examination after completed treatment for conditions other than malignant neoplasm: Secondary | ICD-10-CM | POA: Diagnosis not present

## 2012-07-19 DIAGNOSIS — I4891 Unspecified atrial fibrillation: Secondary | ICD-10-CM | POA: Diagnosis not present

## 2012-07-21 DIAGNOSIS — F329 Major depressive disorder, single episode, unspecified: Secondary | ICD-10-CM | POA: Diagnosis not present

## 2012-07-26 DIAGNOSIS — D649 Anemia, unspecified: Secondary | ICD-10-CM | POA: Diagnosis not present

## 2012-08-04 DIAGNOSIS — I4891 Unspecified atrial fibrillation: Secondary | ICD-10-CM | POA: Diagnosis not present

## 2012-08-11 DIAGNOSIS — F411 Generalized anxiety disorder: Secondary | ICD-10-CM | POA: Diagnosis not present

## 2012-08-21 DIAGNOSIS — B86 Scabies: Secondary | ICD-10-CM | POA: Diagnosis not present

## 2012-09-04 DIAGNOSIS — F29 Unspecified psychosis not due to a substance or known physiological condition: Secondary | ICD-10-CM | POA: Diagnosis not present

## 2012-09-04 DIAGNOSIS — F411 Generalized anxiety disorder: Secondary | ICD-10-CM | POA: Diagnosis not present

## 2012-09-04 DIAGNOSIS — F329 Major depressive disorder, single episode, unspecified: Secondary | ICD-10-CM | POA: Diagnosis not present

## 2012-09-05 ENCOUNTER — Encounter: Payer: Self-pay | Admitting: Adult Health

## 2012-09-05 ENCOUNTER — Non-Acute Institutional Stay (SKILLED_NURSING_FACILITY): Payer: Medicare Other | Admitting: Adult Health

## 2012-09-05 DIAGNOSIS — Z7901 Long term (current) use of anticoagulants: Secondary | ICD-10-CM | POA: Diagnosis not present

## 2012-09-05 DIAGNOSIS — I4891 Unspecified atrial fibrillation: Secondary | ICD-10-CM | POA: Diagnosis not present

## 2012-09-05 MED ORDER — WARFARIN SODIUM 2.5 MG PO TABS: 2.5000 mg | ORAL_TABLET | Freq: Every day | ORAL | Status: DC

## 2012-09-05 NOTE — Progress Notes (Signed)
  Subjective:    Patient ID: Jennifer Nielsen, female    DOB: 14-May-1925, 79 y.o.   MRN: [id number]  HPI This is an 79 year old female who has INR 2.2 - therapeutic. No complaints of shortness of breath nor chest pain. She is on chronic Coumadin therapy due to atrial fibrillation.   Review of Systems  Constitutional: Negative.   HENT: Negative.   Respiratory: Negative.   Cardiovascular: Negative.   Gastrointestinal: Negative.   Neurological: Negative.   Hematological: Negative for adenopathy. Does not bruise/bleed easily.  Psychiatric/Behavioral: Negative.        Objective:   Physical Exam  Constitutional: She is oriented to person, place, and time. She appears well-developed and well-nourished.  HENT:  Head: Normocephalic.  Right Ear: External ear normal.  Left Ear: External ear normal.  Eyes: Conjunctivae are normal. Pupils are equal, round, and reactive to light.  Neck: Normal range of motion. Neck supple.  Cardiovascular: Normal rate.   HR iregularly irregular  Pulmonary/Chest: Effort normal and breath sounds normal.  Abdominal: Soft. Bowel sounds are normal.  Musculoskeletal: She exhibits no edema.  Neurological: She is alert and oriented to person, place, and time.  Skin: Skin is warm and dry.  Psychiatric: She has a normal mood and affect. Her behavior is normal.          Assessment & Plan:  Long term (current) use of anticoagulants -  Continue Coumadin 2.5 mg PO Q D and repeat INR on 09/12/12  Atrial fibrillation - well-controlled

## 2012-09-06 ENCOUNTER — Non-Acute Institutional Stay (SKILLED_NURSING_FACILITY): Payer: Medicare Other | Admitting: Adult Health

## 2012-09-06 DIAGNOSIS — J189 Pneumonia, unspecified organism: Secondary | ICD-10-CM | POA: Diagnosis not present

## 2012-09-06 DIAGNOSIS — J811 Chronic pulmonary edema: Secondary | ICD-10-CM | POA: Diagnosis not present

## 2012-09-06 DIAGNOSIS — I4891 Unspecified atrial fibrillation: Secondary | ICD-10-CM | POA: Diagnosis not present

## 2012-09-06 DIAGNOSIS — Z7901 Long term (current) use of anticoagulants: Secondary | ICD-10-CM | POA: Diagnosis not present

## 2012-09-08 ENCOUNTER — Encounter: Payer: Self-pay | Admitting: Adult Health

## 2012-09-08 ENCOUNTER — Non-Acute Institutional Stay (SKILLED_NURSING_FACILITY): Payer: Medicare Other | Admitting: Internal Medicine

## 2012-09-08 DIAGNOSIS — J189 Pneumonia, unspecified organism: Secondary | ICD-10-CM | POA: Diagnosis not present

## 2012-09-08 DIAGNOSIS — F329 Major depressive disorder, single episode, unspecified: Secondary | ICD-10-CM | POA: Diagnosis not present

## 2012-09-08 DIAGNOSIS — F0390 Unspecified dementia without behavioral disturbance: Secondary | ICD-10-CM | POA: Diagnosis not present

## 2012-09-08 DIAGNOSIS — K219 Gastro-esophageal reflux disease without esophagitis: Secondary | ICD-10-CM

## 2012-09-08 DIAGNOSIS — I4891 Unspecified atrial fibrillation: Secondary | ICD-10-CM

## 2012-09-08 NOTE — Progress Notes (Signed)
  Subjective:    Patient ID: Jennifer Nielsen, female    DOB: 1924-11-07, 79 y.o.   MRN: [id number]  HPI This is an 79 year old Caucasian female who has been having productive cough. No fever reported. Chest x-ray shows patchy atelectasis or pneumonitis. No shortness of breath noted.  Review of Systems  Constitutional: Negative for activity change and appetite change.  HENT: Negative.   Eyes: Negative.   Respiratory: Positive for cough. Negative for shortness of breath and wheezing.   Cardiovascular: Positive for leg swelling.  Gastrointestinal: Negative.   Endocrine: Negative.   Genitourinary: Negative.   Neurological: Negative for headaches.  Hematological: Negative for adenopathy. Does not bruise/bleed easily.  Psychiatric/Behavioral: Negative.        Objective:   Physical Exam  Constitutional: She is oriented to person, place, and time. She appears well-developed and well-nourished.  HENT:  Head: Normocephalic.  Right Ear: External ear normal.  Left Ear: External ear normal.  Eyes: Conjunctivae are normal. Pupils are equal, round, and reactive to light.  Neck: Normal range of motion. Neck supple. No thyromegaly present.  Cardiovascular: Normal heart sounds.   Irregular heart rate  Pulmonary/Chest: Breath sounds normal. She is in respiratory distress. She has no wheezes.  Abdominal: Soft. Bowel sounds are normal.  Musculoskeletal: She exhibits edema. She exhibits no tenderness.  BLE edema, 1+  Neurological: She is alert and oriented to person, place, and time.  Skin: Skin is warm and dry.  Psychiatric: She has a normal mood and affect. Her behavior is normal. Judgment and thought content normal.          Assessment & Plan:  Pneumonitis - start Doxycycline 100 mg 1 tab PO BID x 10 days

## 2012-09-09 NOTE — Progress Notes (Signed)
PROGRESS NOTE  DATE: 09/08/12  FACILITY: [facility]  LEVEL OF CARE: SNF  Routine Visit  CHIEF COMPLAINT:  Manage atrial fibrillation & dementia  HISTORY OF PRESENT ILLNESS:  REASSESSMENT OF ONGOING PROBLEMS:  1. ATRIAL FIBRILLATION: the patients a-fib remains stable.  The patient denies DOE, tachycardia, orthopnea, transient neurological sx, pedal edema, palpitations, & PNDs.  No complications noted from the medications currently being used.  2. DEMENTIA: The dementia remaines stable and continues to function adequately in the current living environment with supervision.  The patient has had little changes in behavior. No complications noted from the medications presently being used.  PAST MEDICAL HISTORY : Reviewed.  No changes.  CURRENT MEDICATIONS: Reviewed per [facility]  REVIEW OF SYSTEMS:  GENERAL: no change in appetite, no fatigue, no weight changes, no fever, chills or weakness RESPIRATORY: no cough, SOB, DOE,, wheezing, hemoptysis CARDIAC: no chest pain, edema or palpitations GI: no abdominal pain, diarrhea, constipation, heart burn, nausea or vomiting  PHYSICAL EXAMINATION  VS:  T 97       P70      RR 20     BP 147/86     POX %     WT (Lb)  GENERAL: no acute distress, normal body habitus NECK: supple, trachea midline, no neck masses, no thyroid tenderness, no thyromegaly RESPIRATORY: breathing is even & unlabored, BS CTAB CARDIAC: RRR, no murmur,no extra heart sounds, no edema GI: abdomen soft, normal BS, no masses, no tenderness, no hepatomegaly, no splenomegaly PSYCHIATRIC: the patient is alert & oriented to person, affect & behavior appropriate  LABS/RADIOLOGY:  2/14 CBC normal, glucose 100, total protein 5.8 otherwise CMP normal, digoxin level 0.5 11/13 Depakote level 65.7  ASSESSMENT/PLAN:  1. atrial fibrillation-rate controlled. 2. dementia-stable. 3. osteoarthritis-denies pain. 4. pneumonitis-started on doxycycline for 10 days. Chest x-ray  negative. 5.GERD-stable.  CPT CODE: 16109

## 2012-09-12 ENCOUNTER — Non-Acute Institutional Stay (SKILLED_NURSING_FACILITY): Payer: Medicare Other | Admitting: Adult Health

## 2012-09-12 DIAGNOSIS — I4891 Unspecified atrial fibrillation: Secondary | ICD-10-CM | POA: Diagnosis not present

## 2012-09-12 DIAGNOSIS — J189 Pneumonia, unspecified organism: Secondary | ICD-10-CM | POA: Diagnosis not present

## 2012-09-12 DIAGNOSIS — Z7901 Long term (current) use of anticoagulants: Secondary | ICD-10-CM | POA: Diagnosis not present

## 2012-09-14 ENCOUNTER — Encounter: Payer: Self-pay | Admitting: Adult Health

## 2012-09-14 NOTE — Progress Notes (Signed)
Patient ID: Jennifer Nielsen, female   DOB: May 20, 1925, 79 y.o.   MRN: [id number] Subjective:     Indication: atrial fibrillation Bleeding signs/symptoms: None Thromboembolic signs/symptoms: None  Missed Coumadin doses: None Medication changes: no Dietary changes: no Bacterial/viral infection: no Other concerns: no  The following portions of the patient's history were reviewed and updated as appropriate: allergies, current medications, past family history, past medical history, past social history, past surgical history and problem list.  Review of Systems A comprehensive review of systems was negative.   Objective:    INR Today: 2.5 Current dose:  2.5 mg  Assessment:    Therapeutic INR for goal of 2-3.5   Plan:    1. New dose: no change   2. Next INR: 09/19/12

## 2012-09-19 ENCOUNTER — Non-Acute Institutional Stay (SKILLED_NURSING_FACILITY): Payer: Medicare Other | Admitting: Adult Health

## 2012-09-19 DIAGNOSIS — R21 Rash and other nonspecific skin eruption: Secondary | ICD-10-CM | POA: Diagnosis not present

## 2012-09-19 DIAGNOSIS — I4891 Unspecified atrial fibrillation: Secondary | ICD-10-CM | POA: Diagnosis not present

## 2012-09-19 DIAGNOSIS — Z7901 Long term (current) use of anticoagulants: Secondary | ICD-10-CM | POA: Diagnosis not present

## 2012-09-19 DIAGNOSIS — K219 Gastro-esophageal reflux disease without esophagitis: Secondary | ICD-10-CM | POA: Diagnosis not present

## 2012-09-19 DIAGNOSIS — M1990 Unspecified osteoarthritis, unspecified site: Secondary | ICD-10-CM | POA: Diagnosis not present

## 2012-09-19 DIAGNOSIS — F0390 Unspecified dementia without behavioral disturbance: Secondary | ICD-10-CM | POA: Diagnosis not present

## 2012-09-22 DIAGNOSIS — F29 Unspecified psychosis not due to a substance or known physiological condition: Secondary | ICD-10-CM | POA: Diagnosis not present

## 2012-09-22 DIAGNOSIS — F329 Major depressive disorder, single episode, unspecified: Secondary | ICD-10-CM | POA: Diagnosis not present

## 2012-09-26 DIAGNOSIS — S32409A Unspecified fracture of unspecified acetabulum, initial encounter for closed fracture: Secondary | ICD-10-CM | POA: Diagnosis not present

## 2012-09-26 DIAGNOSIS — S72143A Displaced intertrochanteric fracture of unspecified femur, initial encounter for closed fracture: Secondary | ICD-10-CM | POA: Diagnosis not present

## 2012-09-27 DIAGNOSIS — H353190 Nonexudative age-related macular degeneration, unspecified eye, stage unspecified: Secondary | ICD-10-CM | POA: Diagnosis not present

## 2012-09-27 DIAGNOSIS — H5210 Myopia, unspecified eye: Secondary | ICD-10-CM | POA: Diagnosis not present

## 2012-09-27 DIAGNOSIS — Z961 Presence of intraocular lens: Secondary | ICD-10-CM | POA: Diagnosis not present

## 2012-09-27 DIAGNOSIS — H18599 Other hereditary corneal dystrophies, unspecified eye: Secondary | ICD-10-CM | POA: Diagnosis not present

## 2012-09-27 DIAGNOSIS — H43819 Vitreous degeneration, unspecified eye: Secondary | ICD-10-CM | POA: Diagnosis not present

## 2012-09-27 DIAGNOSIS — H02839 Dermatochalasis of unspecified eye, unspecified eyelid: Secondary | ICD-10-CM | POA: Diagnosis not present

## 2012-09-29 DIAGNOSIS — F329 Major depressive disorder, single episode, unspecified: Secondary | ICD-10-CM | POA: Diagnosis not present

## 2012-09-29 DIAGNOSIS — F29 Unspecified psychosis not due to a substance or known physiological condition: Secondary | ICD-10-CM | POA: Diagnosis not present

## 2012-10-03 ENCOUNTER — Non-Acute Institutional Stay (SKILLED_NURSING_FACILITY): Payer: Medicare Other | Admitting: Adult Health

## 2012-10-03 DIAGNOSIS — F0390 Unspecified dementia without behavioral disturbance: Secondary | ICD-10-CM | POA: Diagnosis not present

## 2012-10-03 DIAGNOSIS — I4891 Unspecified atrial fibrillation: Secondary | ICD-10-CM

## 2012-10-03 DIAGNOSIS — Z7901 Long term (current) use of anticoagulants: Secondary | ICD-10-CM

## 2012-10-03 DIAGNOSIS — K219 Gastro-esophageal reflux disease without esophagitis: Secondary | ICD-10-CM | POA: Diagnosis not present

## 2012-10-03 DIAGNOSIS — R21 Rash and other nonspecific skin eruption: Secondary | ICD-10-CM | POA: Diagnosis not present

## 2012-10-03 DIAGNOSIS — M1990 Unspecified osteoarthritis, unspecified site: Secondary | ICD-10-CM | POA: Diagnosis not present

## 2012-10-06 DIAGNOSIS — F329 Major depressive disorder, single episode, unspecified: Secondary | ICD-10-CM | POA: Diagnosis not present

## 2012-10-06 DIAGNOSIS — F29 Unspecified psychosis not due to a substance or known physiological condition: Secondary | ICD-10-CM | POA: Diagnosis not present

## 2012-10-10 ENCOUNTER — Non-Acute Institutional Stay (SKILLED_NURSING_FACILITY): Payer: Medicare Other | Admitting: Adult Health

## 2012-10-10 DIAGNOSIS — K219 Gastro-esophageal reflux disease without esophagitis: Secondary | ICD-10-CM

## 2012-10-10 DIAGNOSIS — R21 Rash and other nonspecific skin eruption: Secondary | ICD-10-CM | POA: Diagnosis not present

## 2012-10-10 DIAGNOSIS — F0390 Unspecified dementia without behavioral disturbance: Secondary | ICD-10-CM | POA: Diagnosis not present

## 2012-10-10 DIAGNOSIS — M1990 Unspecified osteoarthritis, unspecified site: Secondary | ICD-10-CM | POA: Diagnosis not present

## 2012-10-10 DIAGNOSIS — Z7901 Long term (current) use of anticoagulants: Secondary | ICD-10-CM

## 2012-10-10 DIAGNOSIS — I4891 Unspecified atrial fibrillation: Secondary | ICD-10-CM | POA: Diagnosis not present

## 2012-10-17 ENCOUNTER — Non-Acute Institutional Stay (SKILLED_NURSING_FACILITY): Payer: Medicare Other | Admitting: Adult Health

## 2012-10-17 DIAGNOSIS — Z7901 Long term (current) use of anticoagulants: Secondary | ICD-10-CM | POA: Diagnosis not present

## 2012-10-17 DIAGNOSIS — F0390 Unspecified dementia without behavioral disturbance: Secondary | ICD-10-CM | POA: Diagnosis not present

## 2012-10-17 DIAGNOSIS — K219 Gastro-esophageal reflux disease without esophagitis: Secondary | ICD-10-CM | POA: Diagnosis not present

## 2012-10-17 DIAGNOSIS — I4891 Unspecified atrial fibrillation: Secondary | ICD-10-CM | POA: Diagnosis not present

## 2012-10-17 DIAGNOSIS — M1990 Unspecified osteoarthritis, unspecified site: Secondary | ICD-10-CM | POA: Diagnosis not present

## 2012-10-17 DIAGNOSIS — R21 Rash and other nonspecific skin eruption: Secondary | ICD-10-CM | POA: Diagnosis not present

## 2012-10-18 ENCOUNTER — Non-Acute Institutional Stay (SKILLED_NURSING_FACILITY): Payer: Medicare Other | Admitting: Adult Health

## 2012-10-18 DIAGNOSIS — Z7901 Long term (current) use of anticoagulants: Secondary | ICD-10-CM

## 2012-10-18 DIAGNOSIS — F0390 Unspecified dementia without behavioral disturbance: Secondary | ICD-10-CM | POA: Diagnosis not present

## 2012-10-18 DIAGNOSIS — M1990 Unspecified osteoarthritis, unspecified site: Secondary | ICD-10-CM | POA: Diagnosis not present

## 2012-10-18 DIAGNOSIS — K219 Gastro-esophageal reflux disease without esophagitis: Secondary | ICD-10-CM | POA: Diagnosis not present

## 2012-10-18 DIAGNOSIS — I4891 Unspecified atrial fibrillation: Secondary | ICD-10-CM | POA: Diagnosis not present

## 2012-10-18 DIAGNOSIS — R21 Rash and other nonspecific skin eruption: Secondary | ICD-10-CM | POA: Diagnosis not present

## 2012-10-24 ENCOUNTER — Non-Acute Institutional Stay (SKILLED_NURSING_FACILITY): Payer: Medicare Other | Admitting: Adult Health

## 2012-10-24 DIAGNOSIS — L259 Unspecified contact dermatitis, unspecified cause: Secondary | ICD-10-CM | POA: Diagnosis not present

## 2012-10-24 DIAGNOSIS — I4891 Unspecified atrial fibrillation: Secondary | ICD-10-CM | POA: Diagnosis not present

## 2012-10-24 DIAGNOSIS — Z7901 Long term (current) use of anticoagulants: Secondary | ICD-10-CM | POA: Diagnosis not present

## 2012-10-24 DIAGNOSIS — D234 Other benign neoplasm of skin of scalp and neck: Secondary | ICD-10-CM | POA: Diagnosis not present

## 2012-10-24 DIAGNOSIS — F0390 Unspecified dementia without behavioral disturbance: Secondary | ICD-10-CM | POA: Diagnosis not present

## 2012-10-24 DIAGNOSIS — K219 Gastro-esophageal reflux disease without esophagitis: Secondary | ICD-10-CM | POA: Diagnosis not present

## 2012-10-24 DIAGNOSIS — R21 Rash and other nonspecific skin eruption: Secondary | ICD-10-CM | POA: Diagnosis not present

## 2012-10-24 DIAGNOSIS — M1990 Unspecified osteoarthritis, unspecified site: Secondary | ICD-10-CM | POA: Diagnosis not present

## 2012-10-27 DIAGNOSIS — F329 Major depressive disorder, single episode, unspecified: Secondary | ICD-10-CM | POA: Diagnosis not present

## 2012-10-27 DIAGNOSIS — F29 Unspecified psychosis not due to a substance or known physiological condition: Secondary | ICD-10-CM | POA: Diagnosis not present

## 2012-10-27 DIAGNOSIS — F411 Generalized anxiety disorder: Secondary | ICD-10-CM | POA: Diagnosis not present

## 2012-11-01 DIAGNOSIS — I4891 Unspecified atrial fibrillation: Secondary | ICD-10-CM | POA: Diagnosis not present

## 2012-11-02 DIAGNOSIS — F411 Generalized anxiety disorder: Secondary | ICD-10-CM | POA: Diagnosis not present

## 2012-11-02 DIAGNOSIS — F329 Major depressive disorder, single episode, unspecified: Secondary | ICD-10-CM | POA: Diagnosis not present

## 2012-11-02 DIAGNOSIS — F29 Unspecified psychosis not due to a substance or known physiological condition: Secondary | ICD-10-CM | POA: Diagnosis not present

## 2012-11-03 DIAGNOSIS — F329 Major depressive disorder, single episode, unspecified: Secondary | ICD-10-CM | POA: Diagnosis not present

## 2012-11-07 ENCOUNTER — Non-Acute Institutional Stay (SKILLED_NURSING_FACILITY): Payer: Medicare Other | Admitting: Adult Health

## 2012-11-07 DIAGNOSIS — I4891 Unspecified atrial fibrillation: Secondary | ICD-10-CM

## 2012-11-07 DIAGNOSIS — F0390 Unspecified dementia without behavioral disturbance: Secondary | ICD-10-CM

## 2012-11-07 DIAGNOSIS — R21 Rash and other nonspecific skin eruption: Secondary | ICD-10-CM | POA: Diagnosis not present

## 2012-11-07 DIAGNOSIS — Z7901 Long term (current) use of anticoagulants: Secondary | ICD-10-CM | POA: Diagnosis not present

## 2012-11-07 DIAGNOSIS — K219 Gastro-esophageal reflux disease without esophagitis: Secondary | ICD-10-CM

## 2012-11-07 DIAGNOSIS — M1990 Unspecified osteoarthritis, unspecified site: Secondary | ICD-10-CM | POA: Diagnosis not present

## 2012-11-10 DIAGNOSIS — F329 Major depressive disorder, single episode, unspecified: Secondary | ICD-10-CM | POA: Diagnosis not present

## 2012-11-10 DIAGNOSIS — F411 Generalized anxiety disorder: Secondary | ICD-10-CM | POA: Diagnosis not present

## 2012-11-10 DIAGNOSIS — F29 Unspecified psychosis not due to a substance or known physiological condition: Secondary | ICD-10-CM | POA: Diagnosis not present

## 2012-11-14 ENCOUNTER — Non-Acute Institutional Stay (SKILLED_NURSING_FACILITY): Payer: Medicare Other | Admitting: Adult Health

## 2012-11-14 DIAGNOSIS — F0390 Unspecified dementia without behavioral disturbance: Secondary | ICD-10-CM | POA: Diagnosis not present

## 2012-11-14 DIAGNOSIS — I4891 Unspecified atrial fibrillation: Secondary | ICD-10-CM | POA: Diagnosis not present

## 2012-11-14 DIAGNOSIS — K219 Gastro-esophageal reflux disease without esophagitis: Secondary | ICD-10-CM | POA: Diagnosis not present

## 2012-11-14 DIAGNOSIS — Z7901 Long term (current) use of anticoagulants: Secondary | ICD-10-CM | POA: Diagnosis not present

## 2012-11-14 DIAGNOSIS — M1990 Unspecified osteoarthritis, unspecified site: Secondary | ICD-10-CM | POA: Diagnosis not present

## 2012-11-14 DIAGNOSIS — R21 Rash and other nonspecific skin eruption: Secondary | ICD-10-CM | POA: Diagnosis not present

## 2012-11-16 DIAGNOSIS — I4891 Unspecified atrial fibrillation: Secondary | ICD-10-CM | POA: Diagnosis not present

## 2012-11-17 DIAGNOSIS — F29 Unspecified psychosis not due to a substance or known physiological condition: Secondary | ICD-10-CM | POA: Diagnosis not present

## 2012-11-17 DIAGNOSIS — F329 Major depressive disorder, single episode, unspecified: Secondary | ICD-10-CM | POA: Diagnosis not present

## 2012-11-17 DIAGNOSIS — F411 Generalized anxiety disorder: Secondary | ICD-10-CM | POA: Diagnosis not present

## 2012-11-29 ENCOUNTER — Non-Acute Institutional Stay (SKILLED_NURSING_FACILITY): Payer: Medicare Other | Admitting: Adult Health

## 2012-11-29 DIAGNOSIS — Z7901 Long term (current) use of anticoagulants: Secondary | ICD-10-CM | POA: Diagnosis not present

## 2012-11-29 DIAGNOSIS — I4891 Unspecified atrial fibrillation: Secondary | ICD-10-CM | POA: Diagnosis not present

## 2012-11-29 DIAGNOSIS — K219 Gastro-esophageal reflux disease without esophagitis: Secondary | ICD-10-CM | POA: Diagnosis not present

## 2012-11-29 DIAGNOSIS — F0390 Unspecified dementia without behavioral disturbance: Secondary | ICD-10-CM | POA: Diagnosis not present

## 2012-11-29 DIAGNOSIS — R21 Rash and other nonspecific skin eruption: Secondary | ICD-10-CM

## 2012-11-29 DIAGNOSIS — M1990 Unspecified osteoarthritis, unspecified site: Secondary | ICD-10-CM | POA: Diagnosis not present

## 2012-12-01 ENCOUNTER — Non-Acute Institutional Stay (SKILLED_NURSING_FACILITY): Payer: Medicare Other | Admitting: Internal Medicine

## 2012-12-01 DIAGNOSIS — I4891 Unspecified atrial fibrillation: Secondary | ICD-10-CM

## 2012-12-01 DIAGNOSIS — F0390 Unspecified dementia without behavioral disturbance: Secondary | ICD-10-CM

## 2012-12-01 DIAGNOSIS — K219 Gastro-esophageal reflux disease without esophagitis: Secondary | ICD-10-CM

## 2012-12-01 DIAGNOSIS — M1990 Unspecified osteoarthritis, unspecified site: Secondary | ICD-10-CM

## 2012-12-04 DIAGNOSIS — IMO0001 Reserved for inherently not codable concepts without codable children: Secondary | ICD-10-CM | POA: Diagnosis not present

## 2012-12-04 DIAGNOSIS — Z79899 Other long term (current) drug therapy: Secondary | ICD-10-CM | POA: Diagnosis not present

## 2012-12-08 DIAGNOSIS — M1990 Unspecified osteoarthritis, unspecified site: Secondary | ICD-10-CM | POA: Insufficient documentation

## 2012-12-08 DIAGNOSIS — K219 Gastro-esophageal reflux disease without esophagitis: Secondary | ICD-10-CM | POA: Insufficient documentation

## 2012-12-08 DIAGNOSIS — F0390 Unspecified dementia without behavioral disturbance: Secondary | ICD-10-CM | POA: Insufficient documentation

## 2012-12-08 NOTE — Progress Notes (Signed)
PROGRESS NOTE  DATE: 12/01/12  FACILITY: [facility]  LEVEL OF CARE: SNF  Routine Visit  CHIEF COMPLAINT:  Manage atrial fibrillation & dementia  HISTORY OF PRESENT ILLNESS:  REASSESSMENT OF ONGOING PROBLEMS:  1. ATRIAL FIBRILLATION: the patients a-fib remains stable.  The patient denies DOE, tachycardia, orthopnea, transient neurological sx, pedal edema, palpitations, & PNDs.  No complications noted from the medications currently being used.  2. DEMENTIA: The dementia remaines stable and continues to function adequately in the current living environment with supervision.  The patient has had little changes in behavior. No complications noted from the medications presently being used.  PAST MEDICAL HISTORY : Reviewed.  No changes.  CURRENT MEDICATIONS: Reviewed per [facility]  REVIEW OF SYSTEMS:  GENERAL: no change in appetite, no fatigue, no weight changes, no fever, chills or weakness RESPIRATORY: no cough, SOB, DOE,, wheezing, hemoptysis CARDIAC: no chest pain, edema or palpitations GI: no abdominal pain, diarrhea, constipation, heart burn, nausea or vomiting  PHYSICAL EXAMINATION  VS:  T 97.7       P69      RR 18     BP 126/84    POX %     WT (Lb)  GENERAL: no acute distress, moderately obese body habitus NECK: supple, trachea midline, no neck masses, no thyroid tenderness, no thyromegaly RESPIRATORY: breathing is even & unlabored, BS CTAB CARDIAC: RRR, no murmur,no extra heart sounds, no edema GI: abdomen soft, normal BS, no masses, no tenderness, no hepatomegaly, no splenomegaly PSYCHIATRIC: the patient is alert & oriented to person, affect & behavior appropriate  LABS/RADIOLOGY:  6/14 digoxin level 0.9  2/14 CBC normal, glucose 100, total protein 5.8 otherwise CMP normal, digoxin level 0.5 11/13 Depakote level 65.7  ASSESSMENT/PLAN:  atrial fibrillation-rate controlled. dementia-stable. osteoarthritis-denies pain. GERD-stable. Check HbA1c & depakote  level  CPT CODE: 16109

## 2012-12-11 DIAGNOSIS — F329 Major depressive disorder, single episode, unspecified: Secondary | ICD-10-CM | POA: Diagnosis not present

## 2012-12-22 DIAGNOSIS — F329 Major depressive disorder, single episode, unspecified: Secondary | ICD-10-CM | POA: Diagnosis not present

## 2012-12-22 DIAGNOSIS — F411 Generalized anxiety disorder: Secondary | ICD-10-CM | POA: Diagnosis not present

## 2012-12-22 DIAGNOSIS — F29 Unspecified psychosis not due to a substance or known physiological condition: Secondary | ICD-10-CM | POA: Diagnosis not present

## 2012-12-29 ENCOUNTER — Non-Acute Institutional Stay (SKILLED_NURSING_FACILITY): Payer: Medicare Other | Admitting: Internal Medicine

## 2012-12-29 DIAGNOSIS — K219 Gastro-esophageal reflux disease without esophagitis: Secondary | ICD-10-CM | POA: Diagnosis not present

## 2012-12-29 DIAGNOSIS — M1990 Unspecified osteoarthritis, unspecified site: Secondary | ICD-10-CM | POA: Diagnosis not present

## 2012-12-29 DIAGNOSIS — F0390 Unspecified dementia without behavioral disturbance: Secondary | ICD-10-CM | POA: Diagnosis not present

## 2012-12-29 DIAGNOSIS — I4891 Unspecified atrial fibrillation: Secondary | ICD-10-CM | POA: Diagnosis not present

## 2013-01-05 DIAGNOSIS — F329 Major depressive disorder, single episode, unspecified: Secondary | ICD-10-CM | POA: Diagnosis not present

## 2013-01-05 NOTE — Progress Notes (Signed)
PROGRESS NOTE  DATE: 12/29/12  FACILITY: [facility]  LEVEL OF CARE: SNF  Routine Visit  CHIEF COMPLAINT:  Manage atrial fibrillation & dementia  HISTORY OF PRESENT ILLNESS:  REASSESSMENT OF ONGOING PROBLEMS:  ATRIAL FIBRILLATION: the patients a-fib remains stable.  The patient denies DOE, tachycardia, orthopnea, transient neurological sx, pedal edema, palpitations, & PNDs.  No complications noted from the medications currently being used.  DEMENTIA: The dementia remaines stable and continues to function adequately in the current living environment with supervision.  The patient has had little changes in behavior. No complications noted from the medications presently being used.  PAST MEDICAL HISTORY : Reviewed.  No changes.  CURRENT MEDICATIONS: Reviewed per [facility]  REVIEW OF SYSTEMS:  GENERAL: no change in appetite, no fatigue, no weight changes, no fever, chills or weakness RESPIRATORY: no cough, SOB, DOE,, wheezing, hemoptysis CARDIAC: no chest pain, edema or palpitations GI: no abdominal pain, diarrhea, constipation, heart burn, nausea or vomiting  PHYSICAL EXAMINATION  VS:  T 97.2       P81      RR 18     BP 121/58   POX %     WT (Lb)  GENERAL: no acute distress, moderately obese body habitus NECK: supple, trachea midline, no neck masses, no thyroid tenderness, no thyromegaly RESPIRATORY: breathing is even & unlabored, BS CTAB CARDIAC: RRR, no murmur,no extra heart sounds, no edema GI: abdomen soft, normal BS, no masses, no tenderness, no hepatomegaly, no splenomegaly PSYCHIATRIC: the patient is alert & oriented to person, affect & behavior appropriate  LABS/RADIOLOGY:  6/14 digoxin level 0.9, hemoglobin A1c 5.6, Depakote level 40.4  2/14 CBC normal, glucose 100, total protein 5.8 otherwise CMP normal, digoxin level 0.5 11/13 Depakote level 65.7  ASSESSMENT/PLAN:  atrial fibrillation-rate controlled. dementia-stable. osteoarthritis-denies  pain. GERD-stable.  CPT CODE: 40981

## 2013-01-08 DIAGNOSIS — R059 Cough, unspecified: Secondary | ICD-10-CM | POA: Diagnosis not present

## 2013-01-08 DIAGNOSIS — R7309 Other abnormal glucose: Secondary | ICD-10-CM | POA: Diagnosis not present

## 2013-01-08 DIAGNOSIS — I872 Venous insufficiency (chronic) (peripheral): Secondary | ICD-10-CM | POA: Diagnosis not present

## 2013-01-08 DIAGNOSIS — R7989 Other specified abnormal findings of blood chemistry: Secondary | ICD-10-CM | POA: Diagnosis not present

## 2013-01-08 DIAGNOSIS — E785 Hyperlipidemia, unspecified: Secondary | ICD-10-CM | POA: Diagnosis not present

## 2013-01-08 DIAGNOSIS — R05 Cough: Secondary | ICD-10-CM | POA: Diagnosis not present

## 2013-01-08 DIAGNOSIS — M1990 Unspecified osteoarthritis, unspecified site: Secondary | ICD-10-CM | POA: Diagnosis not present

## 2013-01-11 ENCOUNTER — Encounter: Payer: Self-pay | Admitting: Adult Health

## 2013-01-11 DIAGNOSIS — F329 Major depressive disorder, single episode, unspecified: Secondary | ICD-10-CM | POA: Diagnosis not present

## 2013-01-11 DIAGNOSIS — R21 Rash and other nonspecific skin eruption: Secondary | ICD-10-CM | POA: Insufficient documentation

## 2013-01-11 NOTE — Progress Notes (Signed)
Patient ID: Jennifer Nielsen, female   DOB: [birth year], 79 y.o.   MRN: [id number]  Subjective:     Indication: atrial fibrillation Bleeding signs/symptoms: None Thromboembolic signs/symptoms: None Missed Coumadin doses:None Medication changes: no Dietary changes: no Bacterial/viral infection: no Other concerns: no  The following portions of the patient's history were reviewed and updated as appropriate: allergies, current medications, past family history, past medical history, past social history, past surgical history and problem list.  Review of Systems A comprehensive review of systems was negative.   Objective:    INR Today: 2.8 Current dose:    Coumadin 2.5 mg by mouth daily Assessment:   therapeutic INR for goal of 2-3.5   Plan:    1. New dose: Continue Coumadin to 2.5 mg by mouth daily  2. Next INR: 10/31/12

## 2013-01-11 NOTE — Progress Notes (Signed)
Patient ID: Jennifer Nielsen, female   DOB: Aug 15, 1924, 79 y.o.   MRN: [id number]  Subjective:     Indication: atrial fibrillation Bleeding signs/symptoms: None Thromboembolic signs/symptoms: None  Missed Coumadin doses: None Medication changes: no Dietary changes: no Bacterial/viral infection: no Other concerns: no  The following portions of the patient's history were reviewed and updated as appropriate: allergies, current medications, past family history, past medical history, past social history, past surgical history and problem list.  Review of Systems A comprehensive review of systems was negative.   Objective:    INR Today: 2.3 Current dose:  2.5 mg  Assessment:    Therapeutic INR for goal of 2-3.5   Plan:    1. New dose: no change   2. Next INR: 09/26/12

## 2013-01-11 NOTE — Progress Notes (Signed)
Patient ID: Jennifer Nielsen, female   DOB: August 26, 1924, 79 y.o.   MRN: [id number]  Subjective:     Indication: atrial fibrillation Bleeding signs/symptoms: None Thromboembolic signs/symptoms: None Missed Coumadin doses: 1 due to supratherapeutic INR Medication changes: no Dietary changes: no Bacterial/viral infection: no Other concerns: no  The following portions of the patient's history were reviewed and updated as appropriate: allergies, current medications, past family history, past medical history, past social history, past surgical history and problem list.  Review of Systems A comprehensive review of systems was negative.   Objective:    INR Today: 3.2 Current dose:   held x1 day Assessment:   Supratherapeutic INR for goal of 2-3.5   Plan:    1. New dose: Decrease Coumadin to 2.5 mg by mouth daily  2. Next INR: 10/24/12

## 2013-01-11 NOTE — Progress Notes (Signed)
Patient ID: Jennifer Nielsen, female   DOB: 1925/01/08, 79 y.o.   MRN: [id number]  Subjective:     Indication: atrial fibrillation Bleeding signs/symptoms: None Thromboembolic signs/symptoms: None Missed Coumadin doses: None Medication changes: no Dietary changes: no Bacterial/viral infection: no Other concerns: no  The following portions of the patient's history were reviewed and updated as appropriate: allergies, current medications, past family history, past medical history, past social history, past surgical history and problem list.  Review of Systems A comprehensive review of systems was negative.   Objective:    INR Today: 3.9 Current dose:   Coumadin 3 mg by mouth daily  Assessment:   Supratherapeutic INR for goal of 2-3.5   Plan:    1. New dose: Hold Coumadin 2. Next INR: 10/18/12

## 2013-01-11 NOTE — Progress Notes (Signed)
Patient ID: Jennifer Nielsen, female   DOB: September 04, 1924, 79 y.o.   MRN: [id number]         PROGRESS NOTE  DATE: 10/10/12  FACILITY: [facility]  LEVEL OF CARE: SNF  Routine Visit  CHIEF COMPLAINT:  Manage atrial fibrillation, Esophageal reflux & dementia  HISTORY OF PRESENT ILLNESS:  REASSESSMENT OF ONGOING PROBLEMS:  1. ATRIAL FIBRILLATION: the patients a-fib remains stable.  The patient denies DOE, tachycardia, orthopnea, transient neurological sx, pedal edema, palpitations, & PNDs.  No complications noted from the medications currently being used.  2. GERD: pt's GERD is stable.  Denies ongoing heartburn, abd. Pain, nausea or vomiting.  Currently on a PPI & tolerates it without any adverse reactions. PAST MEDICAL HISTORY : Reviewed.  No changes.  CURRENT MEDICATIONS: Reviewed per [facility]  REVIEW OF SYSTEMS:  GENERAL: no change in appetite, no fatigue, no weight changes, no fever, chills or weakness RESPIRATORY: no cough, SOB, DOE,, wheezing, hemoptysis CARDIAC: no chest pain, edema or palpitations GI: no abdominal pain, diarrhea, constipation, heart burn, nausea or vomiting  PHYSICAL EXAMINATION  VS:  T 98.9      P 87      RR 19    BP 138/70     POX 95 %     WT 198.4 (Lb)  GENERAL: no acute distress, normal body habitus NECK: supple, trachea midline, no neck masses, no thyroid tenderness, no thyromegaly RESPIRATORY: breathing is even & unlabored, BS CTAB CARDIAC: RRR, no murmur,no extra heart sounds, no edema GI: abdomen soft, normal BS, no masses, no tenderness, no hepatomegaly, no splenomegaly EXTREMITIES: Able to move all 4 extremities PSYCHIATRIC: the patient is alert & oriented to person, affect & behavior appropriate  LABS/RADIOLOGY: 3/14  chest x-ray shows patchy atelectasis or pneumonitis at the left lung base 2/14 CBC normal, glucose 100, total protein 5.8 otherwise CMP normal, digoxin level 0.5 11/13 Depakote level 65.7  ASSESSMENT/PLAN:  1. atrial  fibrillation-rate controlled. 2. dementia-stable. 3. osteoarthritis-denies pain. 4. GERD-stable. 5. long-term (current) use of anticoagulants - INR - 3.0 - therapeutic; continue Coumadin 3 mg by mouth daily and repeat INR on 10/17/12    CPT CODE: 95621

## 2013-01-11 NOTE — Progress Notes (Signed)
Patient ID: Jennifer Nielsen, female   DOB: 1924/12/03, 79 y.o.   MRN: [id number]  Subjective:     Indication: atrial fibrillation Bleeding signs/symptoms: None Thromboembolic signs/symptoms: None Missed Coumadin doses:None Medication changes: no Dietary changes: no Bacterial/viral infection: no Other concerns: no  The following portions of the patient's history were reviewed and updated as appropriate: allergies, current medications, past family history, past medical history, past social history, past surgical history and problem list.  Review of Systems A comprehensive review of systems was negative.   Objective:    INR Today: 2.5 Current dose:    Coumadin 2.5 mg by mouth daily Assessment:   therapeutic INR for goal of 2-3.5   Plan:    1. New dose: Continue Coumadin to 2.5 mg by mouth daily  2. Next INR: 11/21/12

## 2013-01-11 NOTE — Progress Notes (Signed)
Patient ID: Jennifer Nielsen, female   DOB: 07/25/1924, 79 y.o.   MRN: [id number]  Subjective:     Indication: atrial fibrillation Bleeding signs/symptoms: None Thromboembolic signs/symptoms: None  Missed Coumadin doses: None Medication changes: no Dietary changes: no Bacterial/viral infection: no Other concerns: no  The following portions of the patient's history were reviewed and updated as appropriate: allergies, current medications, past family history, past medical history, past social history, past surgical history and problem list.  Review of Systems A comprehensive review of systems was negative.   Objective:    INR Today: 1.5 Current dose:  2.5 mg  Assessment:   Subtherapeutic INR for goal of 2-3.5   Plan:    1. New dose: give Coumadin 4 mg by mouth x1 today then  Coumadin 3 mg by mouth daily 2. Next INR: 10/06/12

## 2013-01-11 NOTE — Progress Notes (Signed)
Patient ID: Jennifer Nielsen, female   DOB: [birth year], 79 y.o.   MRN: [id number]        PROGRESS NOTE  DATE: 11/29/12  FACILITY: [facility]  LEVEL OF CARE: SNF  Acute Visit  CHIEF COMPLAINT:  Rashes and Coumadin therapy  HISTORY OF PRESENT ILLNESS: This is an 79 year old female who is being seen due to maculopapular rashes on back, chest, neck and bilateral arms. Today's INR 2.3 - therapeutic. No complaints of chest pain nor shortness of breath. She is on chronic Coumadin therapy due to atrial fibrillation.  REASSESSMENT OF ONGOING PROBLEMS:  1. GERD: pt's GERD is stable.  Denies ongoing heartburn, abd. Pain, nausea or vomiting.  Currently on a PPI & tolerates it without any adverse reactions. 2. DEMENTIA: The dementia remaines stable and continues to function adequately in the current living environment with supervision.  The patient has had little changes in behavior. No complications noted from the medications presently being used.   PAST MEDICAL HISTORY : Reviewed.  No changes.   CURRENT MEDICATIONS: Reviewed per [facility]  REVIEW OF SYSTEMS:  GENERAL: no change in appetite, no fatigue, no weight changes, no fever, chills or weakness SKIN : (+) rashes RESPIRATORY: no cough, SOB, DOE,, wheezing, hemoptysis CARDIAC: no chest pain, edema or palpitations GI: no abdominal pain, diarrhea, constipation, heart burn, nausea or vomiting  PHYSICAL EXAMINATION  VS:  T 97.3     P57     RR 18    BP 128/72     POX 98 %     WT 197.2 (Lb)  GENERAL: no acute distress, normal body habitus SKIN :  Maculopapular rashes on back, chest, neck, and bilateral arms RESPIRATORY: breathing is even & unlabored, BS CTAB CARDIAC: irregularly irregular, no murmur,no extra heart sounds, no edema GI: abdomen soft, normal BS, no masses, no tenderness, no hepatomegaly, no splenomegaly EXTREMITIES: Able to move all 4 extremities PSYCHIATRIC: the patient is alert & oriented to person, affect & behavior  appropriate  LABS/RADIOLOGY: 5/14 digoxin 0.5 3/14  chest x-ray shows patchy atelectasis or pneumonitis at the left lung base 2/14 CBC normal, glucose 100, total protein 5.8 otherwise CMP normal, digoxin level 0.5 11/13 Depakote level 65.7  ASSESSMENT/PLAN:  1. atrial fibrillation-rate controlled. 2. long-term (current) use of anticoagulants -  continue Coumadin 2.5 mg by mouth daily and repeat INR on 12/05/12 3. Rashes - apply hydrocortisone 1% cream to rashes on the back, chest, neck and bilateral arms twice a day x2 weeks  CPT CODE: 11914

## 2013-01-11 NOTE — Progress Notes (Signed)
Patient ID: Jennifer Nielsen, female   DOB: October 09, 1924, 79 y.o.   MRN: [id number]         PROGRESS NOTE  DATE: 11/07/12  FACILITY: [facility]  LEVEL OF CARE: SNF  Routine Visit  CHIEF COMPLAINT:  Manage atrial fibrillation, Esophageal reflux & dementia  HISTORY OF PRESENT ILLNESS:  REASSESSMENT OF ONGOING PROBLEMS:  1. ATRIAL FIBRILLATION: the patients a-fib remains stable.  The patient denies DOE, tachycardia, orthopnea, transient neurological sx, pedal edema, palpitations, & PNDs.  No complications noted from the medications currently being used.  2. DEMENTIA: The dementia remaines stable and continues to function adequately in the current living environment with supervision.  The patient has had little changes in behavior. No complications noted from the medications presently being used.   PAST MEDICAL HISTORY : Reviewed.  No changes.  CURRENT MEDICATIONS: Reviewed per [facility]  REVIEW OF SYSTEMS:  GENERAL: no change in appetite, no fatigue, no weight changes, no fever, chills or weakness RESPIRATORY: no cough, SOB, DOE,, wheezing, hemoptysis CARDIAC: no chest pain, edema or palpitations GI: no abdominal pain, diarrhea, constipation, heart burn, nausea or vomiting  PHYSICAL EXAMINATION  VS:  T 97.8      P 69      RR 18    BP 123/62     POX 95 %     WT 198.2 (Lb)  GENERAL: no acute distress, normal body habitus RESPIRATORY: breathing is even & unlabored, BS CTAB CARDIAC: irregularly irregular, no murmur,no extra heart sounds, no edema GI: abdomen soft, normal BS, no masses, no tenderness, no hepatomegaly, no splenomegaly EXTREMITIES: Able to move all 4 extremities PSYCHIATRIC: the patient is alert & oriented to person, affect & behavior appropriate  LABS/RADIOLOGY: 5/14 digoxin 0.5 3/14  chest x-ray shows patchy atelectasis or pneumonitis at the left lung base 2/14 CBC normal, glucose 100, total protein 5.8 otherwise CMP normal, digoxin level 0.5 11/13 Depakote level  65.7  ASSESSMENT/PLAN:  1. atrial fibrillation-rate controlled. 2. dementia-stable. 3. osteoarthritis-denies pain. 4. GERD-stable. 5. long-term (current) use of anticoagulants - INR - 2.1 - therapeutic; continue Coumadin 2.5 mg by mouth daily and repeat INR on 11/14/12   CPT CODE: 40102

## 2013-01-12 DIAGNOSIS — F329 Major depressive disorder, single episode, unspecified: Secondary | ICD-10-CM | POA: Diagnosis not present

## 2013-01-16 DIAGNOSIS — D649 Anemia, unspecified: Secondary | ICD-10-CM | POA: Diagnosis not present

## 2013-01-16 DIAGNOSIS — I10 Essential (primary) hypertension: Secondary | ICD-10-CM | POA: Diagnosis not present

## 2013-02-02 ENCOUNTER — Non-Acute Institutional Stay (SKILLED_NURSING_FACILITY): Payer: Medicare Other | Admitting: Adult Health

## 2013-02-02 DIAGNOSIS — M1990 Unspecified osteoarthritis, unspecified site: Secondary | ICD-10-CM

## 2013-02-02 DIAGNOSIS — F0390 Unspecified dementia without behavioral disturbance: Secondary | ICD-10-CM

## 2013-02-02 DIAGNOSIS — F29 Unspecified psychosis not due to a substance or known physiological condition: Secondary | ICD-10-CM | POA: Diagnosis not present

## 2013-02-02 DIAGNOSIS — K219 Gastro-esophageal reflux disease without esophagitis: Secondary | ICD-10-CM | POA: Diagnosis not present

## 2013-02-02 DIAGNOSIS — I4891 Unspecified atrial fibrillation: Secondary | ICD-10-CM

## 2013-02-02 DIAGNOSIS — Z7901 Long term (current) use of anticoagulants: Secondary | ICD-10-CM

## 2013-02-05 DIAGNOSIS — B86 Scabies: Secondary | ICD-10-CM | POA: Diagnosis not present

## 2013-02-09 DIAGNOSIS — F329 Major depressive disorder, single episode, unspecified: Secondary | ICD-10-CM | POA: Diagnosis not present

## 2013-02-09 DIAGNOSIS — F411 Generalized anxiety disorder: Secondary | ICD-10-CM | POA: Diagnosis not present

## 2013-02-09 DIAGNOSIS — F29 Unspecified psychosis not due to a substance or known physiological condition: Secondary | ICD-10-CM | POA: Diagnosis not present

## 2013-02-14 DIAGNOSIS — F29 Unspecified psychosis not due to a substance or known physiological condition: Secondary | ICD-10-CM | POA: Diagnosis not present

## 2013-02-14 DIAGNOSIS — F411 Generalized anxiety disorder: Secondary | ICD-10-CM | POA: Diagnosis not present

## 2013-02-14 DIAGNOSIS — F329 Major depressive disorder, single episode, unspecified: Secondary | ICD-10-CM | POA: Diagnosis not present

## 2013-02-20 ENCOUNTER — Non-Acute Institutional Stay (SKILLED_NURSING_FACILITY): Payer: Medicare Other | Admitting: Adult Health

## 2013-02-20 DIAGNOSIS — Z7901 Long term (current) use of anticoagulants: Secondary | ICD-10-CM | POA: Diagnosis not present

## 2013-02-20 DIAGNOSIS — I4891 Unspecified atrial fibrillation: Secondary | ICD-10-CM | POA: Diagnosis not present

## 2013-03-09 ENCOUNTER — Non-Acute Institutional Stay (SKILLED_NURSING_FACILITY): Payer: Medicare Other | Admitting: Adult Health

## 2013-03-09 ENCOUNTER — Encounter: Payer: Self-pay | Admitting: Adult Health

## 2013-03-09 DIAGNOSIS — F29 Unspecified psychosis not due to a substance or known physiological condition: Secondary | ICD-10-CM | POA: Insufficient documentation

## 2013-03-09 DIAGNOSIS — F329 Major depressive disorder, single episode, unspecified: Secondary | ICD-10-CM | POA: Diagnosis not present

## 2013-03-09 DIAGNOSIS — I4891 Unspecified atrial fibrillation: Secondary | ICD-10-CM | POA: Diagnosis not present

## 2013-03-09 DIAGNOSIS — M1990 Unspecified osteoarthritis, unspecified site: Secondary | ICD-10-CM | POA: Diagnosis not present

## 2013-03-09 DIAGNOSIS — K219 Gastro-esophageal reflux disease without esophagitis: Secondary | ICD-10-CM

## 2013-03-09 DIAGNOSIS — F0390 Unspecified dementia without behavioral disturbance: Secondary | ICD-10-CM

## 2013-03-09 NOTE — Progress Notes (Signed)
Patient ID: Jennifer Nielsen, female   DOB: Jun 29, 1924, 79 y.o.   MRN: [id number]        PROGRESS NOTE  DATE: 02/02/13  FACILITY: [facility]  LEVEL OF CARE: SNF  Routine Visit  CHIEF COMPLAINT:  Manage atrial fibrillation, GERD & dementia  HISTORY OF PRESENT ILLNESS:  REASSESSMENT OF ONGOING PROBLEMS:  GERD: pt's GERD is stable.  Denies ongoing heartburn, abd. Pain, nausea or vomiting.  Currently on a PPI & tolerates it without any adverse reactions.  ATRIAL FIBRILLATION: the patients a-fib remains stable.  The patient denies DOE, tachycardia, orthopnea, transient neurological sx, pedal edema, palpitations, & PNDs.  No complications noted from the medications currently being used.  DEMENTIA: The dementia remaines stable and continues to function adequately in the current living environment with supervision.  The patient has had little changes in behavior. No complications noted from the medications presently being used.Marland Kitchen  PAST MEDICAL HISTORY : Reviewed.  No changes.  CURRENT MEDICATIONS: Reviewed per [facility]  REVIEW OF SYSTEMS:  GENERAL: no change in appetite, no fatigue, no weight changes, no fever, chills or weakness RESPIRATORY: no cough, SOB, DOE,, wheezing, hemoptysis CARDIAC: no chest pain, edema or palpitations GI: no abdominal pain, diarrhea, constipation, heart burn, nausea or vomiting  PHYSICAL EXAMINATION  VS:  T97.8       P72      RR22    BP135/72   POX %     WT205.5 (Lb)  GENERAL: no acute distress, moderately obese body habitus MOUTH/THROAT: lips without lesions, no lesions noted in mouth, tongue is without lesion NECK: supple, trachea midline, no neck masses, no thyroid tenderness, no thyromegaly RESPIRATORY: breathing is even & unlabored, BS CTAB CARDIAC: RRR, no murmur,no extra heart sounds, no edema GI: abdomen soft, normal BS, no masses, no tenderness, no hepatomegaly, no splenomegaly PSYCHIATRIC: the patient is alert & oriented to person, affect & behavior  appropriate  LABS/RADIOLOGY: 8/14 WBC 6.9 hemoglobin 13.2 hematocrit 43.7 sodium 140 potassium 3.9 glucose 137 BUN 17 creatinine 0.6 calcium 9.1 total protein 6.2 albumin 3.4 the enzymes normal 6/14 digoxin level 0.9, hemoglobin A1c 5.6, Depakote level 40.4 2/14 CBC normal, glucose 100, total protein 5.8 otherwise CMP normal, digoxin level 0.5 11/13 Depakote level 65.7  ASSESSMENT/PLAN:  atrial fibrillation-rate controlled. dementia-stable. osteoarthritis-denies pain. GERD-stable. Psychosis - continue Zyprexa and Depakote Long-term use of anticoagulant - INR 2.5 - therapeutic , Coumadin was held x2 days due to supratherapeutic INR; decrease Coumadin to 2 mg by mouth daily; check INR on 02/06/13  CPT CODE: 40981

## 2013-03-09 NOTE — Progress Notes (Signed)
Patient ID: Jennifer Nielsen, female   DOB: [birth year], 79 y.o.   MRN: [id number]        PROGRESS NOTE  DATE: 03/09/13  FACILITY: [facility]  LEVEL OF CARE: SNF (31)  Routine Visit  CHIEF COMPLAINT:  Manage atrial fibrillation, GERD & dementia  HISTORY OF PRESENT ILLNESS:  REASSESSMENT OF ONGOING PROBLEMS:  GERD: pt's GERD is stable.  Denies ongoing heartburn, abd. Pain, nausea or vomiting.  Currently on a PPI & tolerates it without any adverse reactions.  ATRIAL FIBRILLATION: the patients a-fib remains stable.  The patient denies DOE, tachycardia, orthopnea, transient neurological sx, pedal edema, palpitations, & PNDs.  No complications noted from the medications currently being used.  PAST MEDICAL HISTORY : Reviewed.  No changes.  CURRENT MEDICATIONS: Reviewed per [facility]  REVIEW OF SYSTEMS:  GENERAL: no change in appetite, no fatigue, no weight changes, no fever, chills or weakness RESPIRATORY: no cough, SOB, DOE,, wheezing, hemoptysis CARDIAC: no chest pain, edema or palpitations GI: no abdominal pain, diarrhea, constipation, heart burn, nausea or vomiting  PHYSICAL EXAMINATION  VS:  T97.5       P72      RR20   BP149/73   POX95 %     WT205.5 (Lb)  GENERAL: no acute distress, moderately obese body habitus MOUTH/THROAT: lips without lesions, no lesions noted in mouth, tongue is without lesion RESPIRATORY: breathing is even & unlabored, BS CTAB CARDIAC: RRR, no murmur,no extra heart sounds, no edema GI: abdomen soft, normal BS, no masses, no tenderness, no hepatomegaly, no splenomegaly PSYCHIATRIC: the patient is alert & oriented to person, affect & behavior appropriate  LABS/RADIOLOGY: 8/14 WBC 6.9 hemoglobin 13.2 hematocrit 43.7 sodium 140 potassium 3.9 glucose 137 BUN 17 creatinine 0.6 calcium 9.1 total protein 6.2 albumin 3.4 the enzymes normal 6/14 digoxin level 0.9, hemoglobin A1c 5.6, Depakote level 40.4 2/14 CBC normal, glucose 100, total protein 5.8 otherwise CMP  normal, digoxin level 0.5 11/13 Depakote level 65.7  ASSESSMENT/PLAN:  atrial fibrillation-rate controlled. dementia-stable. osteoarthritis-denies pain. GERD-stable. Psychosis - continue Zyprexa and Depakote  CPT CODE: 40981

## 2013-03-09 NOTE — Progress Notes (Signed)
Patient ID: Jennifer Nielsen, female   DOB: 06/08/1925, 79 y.o.   MRN: [id number] Subjective:     Indication: atrial fibrillation Bleeding signs/symptoms: None Thromboembolic signs/symptoms: None  Missed Coumadin doses: None Medication changes: no Dietary changes: no Bacterial/viral infection: no Other concerns: no  The following portions of the patient's history were reviewed and updated as appropriate: allergies, current medications, past family history, past medical history, past social history, past surgical history and problem list.  Review of Systems A comprehensive review of systems was negative.   Objective:    INR Today: 1.7 Current dose: Coumadin 2 mg PO daily  Assessment:    Subtherapeutic INR for goal of 2-3   Plan:    1. New dose: Increase Coumadin to 2.5 mg PO Q D   2. Next INR:  02/23/13

## 2013-03-12 DIAGNOSIS — L259 Unspecified contact dermatitis, unspecified cause: Secondary | ICD-10-CM | POA: Diagnosis not present

## 2013-03-14 DIAGNOSIS — Z23 Encounter for immunization: Secondary | ICD-10-CM | POA: Diagnosis not present

## 2013-04-10 DIAGNOSIS — F29 Unspecified psychosis not due to a substance or known physiological condition: Secondary | ICD-10-CM | POA: Diagnosis not present

## 2013-04-10 DIAGNOSIS — F329 Major depressive disorder, single episode, unspecified: Secondary | ICD-10-CM | POA: Diagnosis not present

## 2013-04-11 ENCOUNTER — Non-Acute Institutional Stay (SKILLED_NURSING_FACILITY): Payer: Medicare Other | Admitting: Internal Medicine

## 2013-04-11 DIAGNOSIS — K219 Gastro-esophageal reflux disease without esophagitis: Secondary | ICD-10-CM | POA: Diagnosis not present

## 2013-04-11 DIAGNOSIS — M1990 Unspecified osteoarthritis, unspecified site: Secondary | ICD-10-CM

## 2013-04-11 DIAGNOSIS — F0390 Unspecified dementia without behavioral disturbance: Secondary | ICD-10-CM

## 2013-04-11 DIAGNOSIS — I4891 Unspecified atrial fibrillation: Secondary | ICD-10-CM

## 2013-04-11 NOTE — Progress Notes (Signed)
PROGRESS NOTE  DATE: 04/11/13  FACILITY: [facility]  LEVEL OF CARE: SNF  Routine Visit  CHIEF COMPLAINT:  Manage atrial fibrillation & dementia  HISTORY OF PRESENT ILLNESS:  REASSESSMENT OF ONGOING PROBLEMS:  ATRIAL FIBRILLATION: the patients a-fib remains stable.  The patient denies DOE, tachycardia, orthopnea, transient neurological sx, pedal edema, palpitations, & PNDs.  No complications noted from the medications currently being used.  DEMENTIA: The dementia remaines stable and continues to function adequately in the current living environment with supervision.  The patient has had little changes in behavior. No complications noted from the medications presently being used.  PAST MEDICAL HISTORY : Reviewed.  No changes.  CURRENT MEDICATIONS: Reviewed per [facility]  REVIEW OF SYSTEMS:  GENERAL: no change in appetite, no fatigue, no weight changes, no fever, chills or weakness RESPIRATORY: no cough, SOB, DOE,, wheezing, hemoptysis CARDIAC: no chest pain, edema or palpitations GI: no abdominal pain, diarrhea, constipation, heart burn, nausea or vomiting  PHYSICAL EXAMINATION  VS:  T 97      P 56     RR 20    BP 128/76   POX % 95   WT (Lb)  GENERAL: no acute distress, moderately obese body habitus NECK: supple, trachea midline, no neck masses, no thyroid tenderness, no thyromegaly RESPIRATORY: breathing is even & unlabored, BS CTAB CARDIAC: RRR, no murmur,no extra heart sounds, no edema GI: abdomen soft, normal BS, no masses, no tenderness, no hepatomegaly, no splenomegaly PSYCHIATRIC: the patient is alert & oriented to person, affect & behavior appropriate  LABS/RADIOLOGY:  8-14 CO2 33, glucose 137, albumin 3.4 otherwise CMP normal, CBC normal  6/14 digoxin level 0.9, hemoglobin A1c 5.6, Depakote level 40.4  2/14 CBC normal, glucose 100, total protein 5.8 otherwise CMP normal, digoxin level 0.5 11/13 Depakote level 65.7  ASSESSMENT/PLAN:  atrial  fibrillation-rate controlled. dementia-stable. osteoarthritis-denies pain. GERD-stable.  CPT CODE: 16109

## 2013-04-17 DIAGNOSIS — F329 Major depressive disorder, single episode, unspecified: Secondary | ICD-10-CM | POA: Diagnosis not present

## 2013-04-17 DIAGNOSIS — F29 Unspecified psychosis not due to a substance or known physiological condition: Secondary | ICD-10-CM | POA: Diagnosis not present

## 2013-04-19 DIAGNOSIS — F29 Unspecified psychosis not due to a substance or known physiological condition: Secondary | ICD-10-CM | POA: Diagnosis not present

## 2013-04-19 DIAGNOSIS — F329 Major depressive disorder, single episode, unspecified: Secondary | ICD-10-CM | POA: Diagnosis not present

## 2013-04-23 DIAGNOSIS — F3289 Other specified depressive episodes: Secondary | ICD-10-CM | POA: Diagnosis not present

## 2013-04-23 DIAGNOSIS — F329 Major depressive disorder, single episode, unspecified: Secondary | ICD-10-CM | POA: Diagnosis not present

## 2013-04-23 DIAGNOSIS — F29 Unspecified psychosis not due to a substance or known physiological condition: Secondary | ICD-10-CM | POA: Diagnosis not present

## 2013-05-01 DIAGNOSIS — F29 Unspecified psychosis not due to a substance or known physiological condition: Secondary | ICD-10-CM | POA: Diagnosis not present

## 2013-05-01 DIAGNOSIS — F329 Major depressive disorder, single episode, unspecified: Secondary | ICD-10-CM | POA: Diagnosis not present

## 2013-05-07 DIAGNOSIS — F329 Major depressive disorder, single episode, unspecified: Secondary | ICD-10-CM | POA: Diagnosis not present

## 2013-05-07 DIAGNOSIS — I10 Essential (primary) hypertension: Secondary | ICD-10-CM | POA: Diagnosis not present

## 2013-05-07 DIAGNOSIS — I4891 Unspecified atrial fibrillation: Secondary | ICD-10-CM | POA: Diagnosis not present

## 2013-05-07 DIAGNOSIS — F29 Unspecified psychosis not due to a substance or known physiological condition: Secondary | ICD-10-CM | POA: Diagnosis not present

## 2013-05-07 DIAGNOSIS — R569 Unspecified convulsions: Secondary | ICD-10-CM | POA: Diagnosis not present

## 2013-05-08 DIAGNOSIS — F329 Major depressive disorder, single episode, unspecified: Secondary | ICD-10-CM | POA: Diagnosis not present

## 2013-05-08 DIAGNOSIS — F29 Unspecified psychosis not due to a substance or known physiological condition: Secondary | ICD-10-CM | POA: Diagnosis not present

## 2013-05-09 ENCOUNTER — Non-Acute Institutional Stay (SKILLED_NURSING_FACILITY): Payer: Medicare Other | Admitting: Internal Medicine

## 2013-05-09 ENCOUNTER — Encounter: Payer: Self-pay | Admitting: Internal Medicine

## 2013-05-09 DIAGNOSIS — M1990 Unspecified osteoarthritis, unspecified site: Secondary | ICD-10-CM

## 2013-05-09 DIAGNOSIS — F0390 Unspecified dementia without behavioral disturbance: Secondary | ICD-10-CM

## 2013-05-09 DIAGNOSIS — I4891 Unspecified atrial fibrillation: Secondary | ICD-10-CM | POA: Diagnosis not present

## 2013-05-09 DIAGNOSIS — K219 Gastro-esophageal reflux disease without esophagitis: Secondary | ICD-10-CM

## 2013-05-09 NOTE — Progress Notes (Signed)
PROGRESS NOTE  DATE: 05/09/13  FACILITY: [facility]  LEVEL OF CARE: SNF  Routine Visit  CHIEF COMPLAINT:  Manage atrial fibrillation & dementia  HISTORY OF PRESENT ILLNESS:  REASSESSMENT OF ONGOING PROBLEMS:  ATRIAL FIBRILLATION: the patients a-fib remains stable.  The patient denies DOE, tachycardia, orthopnea, transient neurological sx, pedal edema, palpitations, & PNDs.  No complications noted from the medications currently being used.  DEMENTIA: The dementia remaines stable and continues to function adequately in the current living environment with supervision.  The patient has had little changes in behavior. No complications noted from the medications presently being used.  PAST MEDICAL HISTORY : Reviewed.  No changes.  CURRENT MEDICATIONS: Reviewed per [facility]  REVIEW OF SYSTEMS:  GENERAL: no change in appetite, no fatigue, no weight changes, no fever, chills or weakness RESPIRATORY: no cough, SOB, DOE,, wheezing, hemoptysis CARDIAC: no chest pain, edema or palpitations GI: no abdominal pain, diarrhea, constipation, heart burn, nausea or vomiting  PHYSICAL EXAMINATION  VS:  T 96.6      P 80     RR 20    BP 122/82   POX % 97   WT (Lb)  GENERAL: no acute distress, moderately obese body habitus NECK: supple, trachea midline, no neck masses, no thyroid tenderness, no thyromegaly RESPIRATORY: breathing is even & unlabored, BS CTAB CARDIAC: RRR, no murmur,no extra heart sounds, no edema GI: abdomen soft, normal BS, no masses, no tenderness, no hepatomegaly, no splenomegaly PSYCHIATRIC: the patient is alert & oriented to person, affect & behavior appropriate  LABS/RADIOLOGY:  8-14 CO2 33, glucose 137, albumin 3.4 otherwise CMP normal, CBC normal  6/14 digoxin level 0.9, hemoglobin A1c 5.6, Depakote level 40.4  2/14 CBC normal, glucose 100, total protein 5.8 otherwise CMP normal, digoxin level 0.5 11/13 Depakote level 65.7  ASSESSMENT/PLAN:  atrial  fibrillation-rate controlled. dementia-stable. osteoarthritis-denies pain. GERD-stable.  CPT CODE: 81191

## 2013-05-15 DIAGNOSIS — F329 Major depressive disorder, single episode, unspecified: Secondary | ICD-10-CM | POA: Diagnosis not present

## 2013-05-15 DIAGNOSIS — F29 Unspecified psychosis not due to a substance or known physiological condition: Secondary | ICD-10-CM | POA: Diagnosis not present

## 2013-05-22 DIAGNOSIS — F329 Major depressive disorder, single episode, unspecified: Secondary | ICD-10-CM | POA: Diagnosis not present

## 2013-05-22 DIAGNOSIS — F29 Unspecified psychosis not due to a substance or known physiological condition: Secondary | ICD-10-CM | POA: Diagnosis not present

## 2013-06-04 DIAGNOSIS — R05 Cough: Secondary | ICD-10-CM | POA: Diagnosis not present

## 2013-06-08 DIAGNOSIS — F329 Major depressive disorder, single episode, unspecified: Secondary | ICD-10-CM | POA: Diagnosis not present

## 2013-06-08 DIAGNOSIS — F29 Unspecified psychosis not due to a substance or known physiological condition: Secondary | ICD-10-CM | POA: Diagnosis not present

## 2013-06-18 ENCOUNTER — Ambulatory Visit (INDEPENDENT_AMBULATORY_CARE_PROVIDER_SITE_OTHER): Payer: Medicare Other | Admitting: Cardiology

## 2013-06-18 ENCOUNTER — Encounter: Payer: Self-pay | Admitting: Cardiology

## 2013-06-18 VITALS — BP 136/74 | HR 77 | Ht 65.0 in | Wt 225.0 lb

## 2013-06-18 DIAGNOSIS — I509 Heart failure, unspecified: Secondary | ICD-10-CM | POA: Diagnosis not present

## 2013-06-18 DIAGNOSIS — I4891 Unspecified atrial fibrillation: Secondary | ICD-10-CM

## 2013-06-18 LAB — COMPREHENSIVE METABOLIC PANEL
ALT: 32 U/L (ref 0–35)
AST: 36 U/L (ref 0–37)
Albumin: 3 g/dL — ABNORMAL LOW (ref 3.5–5.2)
Alkaline Phosphatase: 74 U/L (ref 39–117)
BUN: 14 mg/dL (ref 6–23)
CO2: 35 mEq/L — ABNORMAL HIGH (ref 19–32)
Calcium: 8.2 mg/dL — ABNORMAL LOW (ref 8.4–10.5)
Chloride: 85 mEq/L — ABNORMAL LOW (ref 96–112)
Creatinine, Ser: 0.5 mg/dL (ref 0.4–1.2)
GFR: 130.04 mL/min (ref >60.00)
Glucose, Bld: 99 mg/dL (ref 70–99)
Potassium: 4.9 mEq/L (ref 3.5–5.1)
Sodium: 127 mEq/L — ABNORMAL LOW (ref 135–145)
Total Bilirubin: 0.5 mg/dL (ref 0.3–1.2)
Total Protein: 6.8 g/dL (ref 6.0–8.3)

## 2013-06-18 LAB — PROTIME-INR
INR: 2.4 ratio — ABNORMAL HIGH (ref 0.8–1.0)
Prothrombin Time: 24.6 s — ABNORMAL HIGH (ref 10.2–12.4)

## 2013-06-18 MED ORDER — POTASSIUM CHLORIDE ER 10 MEQ PO TBCR: 10.0000 meq | EXTENDED_RELEASE_TABLET | Freq: Every day | ORAL | Status: DC

## 2013-06-18 MED ORDER — RIVAROXABAN 20 MG PO TABS: 20.0000 mg | ORAL_TABLET | Freq: Every day | ORAL | Status: DC

## 2013-06-18 MED ORDER — FUROSEMIDE 40 MG PO TABS: 40.0000 mg | ORAL_TABLET | Freq: Every day | ORAL | Status: DC

## 2013-06-18 NOTE — Patient Instructions (Addendum)
Your physician has recommended you make the following change in your medication:  1) Start Xarelto  20mg  daily 2) Start Lasix 40mg  daily 3) Start potassium 45meq daily  Lab today: Pt/Inr  Your physician recommends that you return for lab work : same day as echo(cmet)  Your physician has requested that you have an echocardiogram. Echocardiography is a painless test that uses sound waves to create images of your heart. It provides your doctor with information about the size and shape of your heart and how well your heart's chambers and valves are working. This procedure takes approximately one hour. There are no restrictions for this procedure.  Your physician recommends that you schedule a follow-up appointment after echo

## 2013-06-18 NOTE — Progress Notes (Signed)
Patient ID: Jennifer Nielsen, female   DOB: Nov 15, 1924, 80 y.o.   MRN: [id number]     Patient Name: Jennifer Nielsen Date of Encounter: 06/18/2013  Primary Care Provider:  [name], MD Primary Cardiologist:  Ena Dawley, H   Problem List   Past Medical History  Diagnosis Date  . Arrhythmia   . Arthritis   . Atrial fibrillation     Remains stable (As of 05/09/13)  . H/O blood clots   . Hyperlipemia   . Dementia     Remains stable & continues to function adequately in the current living environment with supervision. Has had little changes in behavior (AS OF 05/09/13)  . GERD (gastroesophageal reflux disease)     Stable (As of 05/09/13)  . Osteoarthritis     Denies pain (As of 05/09/13)   Past Surgical History  Procedure Laterality Date  . Appendectomy    . Gallbladder surgery    . Hip surgery      Right-Metal plate   . Leg surgery      Left leg    Allergies  Allergies  Allergen Reactions  . Imodium [Loperamide]   . Penicillins   . Sulfa Antibiotics     HPI  A 80 year old female with h/o very mild dementia and chronic atrial fibrillation on chronic anticoagulation that has been managed by PCP. The patient has been rate controlled with diltiazem and digoxin. She has been very active taking care of herself until 2 years ago when she broke her hip and has been in wheelchair since then. She denies any falls. She is coming for a opinion on new anticoagulants as she has been tired of diet restrictions with coumadine. She denies any prior bleeding events. She denies any prior chest pain or palpitations. Since she made this appointment she started to develop lower extremity edema. This started about 2-4 days ago. She admit to a "stable SOB" that she attributes to COPD. She stopped smoking 50 years ago.  Home Medications  Prior to Admission medications   Medication Sig Start Date End Date Taking? Authorizing Provider  acetaminophen (TYLENOL) 500 MG tablet Take 500 mg by mouth  every 6 (six) hours as needed.   Yes Historical Provider, MD  Calcium Carbonate-Vitamin D (CALTRATE 600+D PO) Take by mouth daily.   Yes Historical Provider, MD  digoxin (LANOXIN) 0.25 MG tablet Take by mouth daily.    Yes Historical Provider, MD  diltiazem (DILACOR XR) 240 MG 24 hr capsule Take 240 mg by mouth daily.   Yes Historical Provider, MD  divalproex (DEPAKOTE) 125 MG DR tablet Take 125 mg by mouth 2 (two) times daily.   Yes Historical Provider, MD  omeprazole (PRILOSEC) 20 MG capsule Take 20 mg by mouth daily.   Yes Historical Provider, MD  Protein (PROCEL) POWD Take by mouth as directed.   Yes Historical Provider, MD  risperiDONE (RISPERDAL) 0.25 MG tablet Take 0.25 mg by mouth 2 (two) times daily.   Yes Historical Provider, MD  warfarin (COUMADIN) 2.5 MG tablet Take 1 tablet (2.5 mg total) by mouth daily. 09/05/12  Yes Monina C Medina-Vargas, NP    Family History  Family History  Problem Relation Age of Onset  . Colon cancer Neg Hx   . Heart disease Father   . Breast cancer Daughter     Social History  History   Social History  . Marital Status: Single    Spouse Name: N/A    Number of Children: 2  .  Years of Education: N/A   Occupational History  . Retired     Pharmacist, hospital   Social History Main Topics  . Smoking status: Former Research scientist (life sciences)  . Smokeless tobacco: Never Used  . Alcohol Use: No  . Drug Use: No  . Sexual Activity: Not on file   Other Topics Concern  . Not on file   Social History Narrative   No caffeine      Review of Systems, as per HPI, otherwise negative General:  No chills, fever, night sweats or weight changes.  Cardiovascular:  No chest pain, dyspnea on exertion, edema, orthopnea, palpitations, paroxysmal nocturnal dyspnea. Dermatological: No rash, lesions/masses Respiratory: No cough, dyspnea Urologic: No hematuria, dysuria Abdominal:   No nausea, vomiting, diarrhea, bright red blood per rectum, melena, or hematemesis Neurologic:  No visual  changes, wkns, changes in mental status. All other systems reviewed and are otherwise negative except as noted above.  Physical Exam BP 136/74, HR 77 BPM Height 5\' 5"  (1.651 m), weight 225 lb (102.059 kg).  General: Pleasant, NAD Psych: Normal affect. Neuro: Alert and oriented X 3. Moves all extremities spontaneously. HEENT: Normal  Neck: Supple without bruits or JVD. Lungs:  Resp regular and unlabored, crackles and wheezes. Heart: RRR no s3, s4, or murmurs. Abdomen: Soft, non-tender, non-distended, BS + x 4.  Extremities: No clubbing, cyanosis, severe pitting edema up to the knees B/L. DP/PT/Radials 2+ and equal bilaterally.  Labs:  No results found for this basename: CKTOTAL, CKMB, TROPONINI,  in the last 72 hours Lab Results  Component Value Date   WBC 6.6 10/29/2011   HGB 13.4 10/29/2011   HCT 41.1 10/29/2011   MCV 95.6 10/29/2011   PLT 197.0 10/29/2011   No results found for this basename: NA, K, CL, CO2, BUN, CREATININE, CALCIUM, LABALBU, PROT, BILITOT, ALKPHOS, ALT, AST, GLUCOSE,  in the last 168 hours No results found for this basename: CHOL, HDL, LDLCALC, TRIG   No results found for this basename: DDIMER   No components found with this basename: POCBNP,   Accessory Clinical Findings  Echocardiogram - none  ECG - atrial fibrillation, LPFB    Assessment & Plan  80 year old female   1. Chronic atrial fibrillation - rate controlled with diltiazem and digoxin. We will switch coumadine to Xarelto 20 mg PO daily, she has normal kidney function and weighs 225 lbs. We will check INR today and if < 3 she can start taking Xarelto tomorrow.  2. New onset CHF, significant fluid overload - unknown etiology, we will start Lasix 40 mg po daily (and KCl 10 mEq daily). We will recheck labs the next week (especially careful about hypokalemia with digoxin therapy). We will oredr an echocardiogram to evaluate for systolic and   Follow up in 2 weeks.    Ena Dawley, Lemmie Evens, MD,  [facility] 06/18/2013, 11:56 AM

## 2013-06-19 ENCOUNTER — Telehealth: Payer: Self-pay | Admitting: Cardiology

## 2013-06-19 NOTE — Telephone Encounter (Signed)
New problem   Need to know if pt's coumadin need to be discontinued since pt is also on xarleto. Please advise

## 2013-06-19 NOTE — Telephone Encounter (Signed)
Kathlee Nations from [facility] is aware that Pt is to start Xarelto and to stop Coumadin all together. Kathlee Nations verbalized understanding.

## 2013-06-22 DIAGNOSIS — R05 Cough: Secondary | ICD-10-CM | POA: Diagnosis not present

## 2013-06-22 DIAGNOSIS — R059 Cough, unspecified: Secondary | ICD-10-CM | POA: Diagnosis not present

## 2013-06-23 DIAGNOSIS — D649 Anemia, unspecified: Secondary | ICD-10-CM | POA: Diagnosis not present

## 2013-06-25 ENCOUNTER — Inpatient Hospital Stay (HOSPITAL_COMMUNITY)
Admission: EM | Admit: 2013-06-25 | Discharge: 2013-07-02 | DRG: 070 | Disposition: A | Discharge status: Skilled Nursing Facility | Payer: Medicare Other | Attending: Internal Medicine | Admitting: Internal Medicine

## 2013-06-25 ENCOUNTER — Emergency Department (HOSPITAL_COMMUNITY): Payer: Medicare Other

## 2013-06-25 ENCOUNTER — Encounter (HOSPITAL_COMMUNITY): Payer: Self-pay | Admitting: Emergency Medicine

## 2013-06-25 DIAGNOSIS — E785 Hyperlipidemia, unspecified: Secondary | ICD-10-CM | POA: Diagnosis present

## 2013-06-25 DIAGNOSIS — R0602 Shortness of breath: Secondary | ICD-10-CM | POA: Diagnosis not present

## 2013-06-25 DIAGNOSIS — E8779 Other fluid overload: Secondary | ICD-10-CM | POA: Diagnosis not present

## 2013-06-25 DIAGNOSIS — H04129 Dry eye syndrome of unspecified lacrimal gland: Secondary | ICD-10-CM | POA: Diagnosis not present

## 2013-06-25 DIAGNOSIS — J13 Pneumonia due to Streptococcus pneumoniae: Secondary | ICD-10-CM | POA: Diagnosis not present

## 2013-06-25 DIAGNOSIS — I517 Cardiomegaly: Secondary | ICD-10-CM | POA: Diagnosis not present

## 2013-06-25 DIAGNOSIS — M6281 Muscle weakness (generalized): Secondary | ICD-10-CM | POA: Diagnosis not present

## 2013-06-25 DIAGNOSIS — E878 Other disorders of electrolyte and fluid balance, not elsewhere classified: Secondary | ICD-10-CM | POA: Diagnosis not present

## 2013-06-25 DIAGNOSIS — Z9089 Acquired absence of other organs: Secondary | ICD-10-CM | POA: Diagnosis not present

## 2013-06-25 DIAGNOSIS — Z79899 Other long term (current) drug therapy: Secondary | ICD-10-CM

## 2013-06-25 DIAGNOSIS — Z882 Allergy status to sulfonamides status: Secondary | ICD-10-CM

## 2013-06-25 DIAGNOSIS — I4891 Unspecified atrial fibrillation: Secondary | ICD-10-CM | POA: Diagnosis present

## 2013-06-25 DIAGNOSIS — Z880 Allergy status to penicillin: Secondary | ICD-10-CM

## 2013-06-25 DIAGNOSIS — K219 Gastro-esophageal reflux disease without esophagitis: Secondary | ICD-10-CM | POA: Diagnosis present

## 2013-06-25 DIAGNOSIS — E872 Acidosis, unspecified: Secondary | ICD-10-CM | POA: Diagnosis present

## 2013-06-25 DIAGNOSIS — I509 Heart failure, unspecified: Secondary | ICD-10-CM | POA: Diagnosis present

## 2013-06-25 DIAGNOSIS — F0390 Unspecified dementia without behavioral disturbance: Secondary | ICD-10-CM | POA: Diagnosis present

## 2013-06-25 DIAGNOSIS — Z91018 Allergy to other foods: Secondary | ICD-10-CM | POA: Diagnosis not present

## 2013-06-25 DIAGNOSIS — R404 Transient alteration of awareness: Secondary | ICD-10-CM | POA: Diagnosis not present

## 2013-06-25 DIAGNOSIS — F29 Unspecified psychosis not due to a substance or known physiological condition: Secondary | ICD-10-CM | POA: Diagnosis not present

## 2013-06-25 DIAGNOSIS — J9600 Acute respiratory failure, unspecified whether with hypoxia or hypercapnia: Secondary | ICD-10-CM | POA: Diagnosis present

## 2013-06-25 DIAGNOSIS — I5031 Acute diastolic (congestive) heart failure: Secondary | ICD-10-CM | POA: Diagnosis present

## 2013-06-25 DIAGNOSIS — E871 Hypo-osmolality and hyponatremia: Secondary | ICD-10-CM | POA: Diagnosis present

## 2013-06-25 DIAGNOSIS — Z7901 Long term (current) use of anticoagulants: Secondary | ICD-10-CM

## 2013-06-25 DIAGNOSIS — R21 Rash and other nonspecific skin eruption: Secondary | ICD-10-CM

## 2013-06-25 DIAGNOSIS — N390 Urinary tract infection, site not specified: Secondary | ICD-10-CM | POA: Diagnosis not present

## 2013-06-25 DIAGNOSIS — G9341 Metabolic encephalopathy: Secondary | ICD-10-CM | POA: Diagnosis not present

## 2013-06-25 DIAGNOSIS — Z881 Allergy status to other antibiotic agents status: Secondary | ICD-10-CM

## 2013-06-25 DIAGNOSIS — I10 Essential (primary) hypertension: Secondary | ICD-10-CM | POA: Diagnosis not present

## 2013-06-25 DIAGNOSIS — Z87891 Personal history of nicotine dependence: Secondary | ICD-10-CM | POA: Diagnosis not present

## 2013-06-25 DIAGNOSIS — G4733 Obstructive sleep apnea (adult) (pediatric): Secondary | ICD-10-CM | POA: Diagnosis present

## 2013-06-25 DIAGNOSIS — I2789 Other specified pulmonary heart diseases: Secondary | ICD-10-CM | POA: Diagnosis present

## 2013-06-25 DIAGNOSIS — Z86718 Personal history of other venous thrombosis and embolism: Secondary | ICD-10-CM

## 2013-06-25 DIAGNOSIS — J189 Pneumonia, unspecified organism: Secondary | ICD-10-CM | POA: Diagnosis present

## 2013-06-25 DIAGNOSIS — R293 Abnormal posture: Secondary | ICD-10-CM | POA: Diagnosis not present

## 2013-06-25 DIAGNOSIS — J811 Chronic pulmonary edema: Secondary | ICD-10-CM | POA: Diagnosis not present

## 2013-06-25 DIAGNOSIS — Z8249 Family history of ischemic heart disease and other diseases of the circulatory system: Secondary | ICD-10-CM

## 2013-06-25 DIAGNOSIS — J9602 Acute respiratory failure with hypercapnia: Secondary | ICD-10-CM | POA: Diagnosis present

## 2013-06-25 DIAGNOSIS — Z6834 Body mass index (BMI) 34.0-34.9, adult: Secondary | ICD-10-CM | POA: Diagnosis not present

## 2013-06-25 DIAGNOSIS — Z803 Family history of malignant neoplasm of breast: Secondary | ICD-10-CM

## 2013-06-25 DIAGNOSIS — R600 Localized edema: Secondary | ICD-10-CM

## 2013-06-25 DIAGNOSIS — Z66 Do not resuscitate: Secondary | ICD-10-CM | POA: Diagnosis present

## 2013-06-25 DIAGNOSIS — R609 Edema, unspecified: Secondary | ICD-10-CM | POA: Diagnosis not present

## 2013-06-25 DIAGNOSIS — M1990 Unspecified osteoarthritis, unspecified site: Secondary | ICD-10-CM

## 2013-06-25 DIAGNOSIS — R1312 Dysphagia, oropharyngeal phase: Secondary | ICD-10-CM | POA: Diagnosis not present

## 2013-06-25 DIAGNOSIS — K2100 Gastro-esophageal reflux disease with esophagitis, without bleeding: Secondary | ICD-10-CM | POA: Diagnosis not present

## 2013-06-25 LAB — CBC WITH DIFFERENTIAL/PLATELET
BASOS PCT: 0 % (ref 0–1)
Basophils Absolute: 0 10*3/uL (ref 0.0–0.1)
EOS ABS: 0 10*3/uL (ref 0.0–0.7)
Eosinophils Relative: 1 % (ref 0–5)
HCT: 41.3 % (ref 36.0–46.0)
Hemoglobin: 13.7 g/dL (ref 12.0–15.0)
Lymphocytes Relative: 15 % (ref 12–46)
Lymphs Abs: 1.2 10*3/uL (ref 0.7–4.0)
MCH: 29.9 pg (ref 26.0–34.0)
MCHC: 33.2 g/dL (ref 30.0–36.0)
MCV: 90.2 fL (ref 78.0–100.0)
MONO ABS: 0.8 10*3/uL (ref 0.1–1.0)
Monocytes Relative: 10 % (ref 3–12)
NEUTROS ABS: 5.9 10*3/uL (ref 1.7–7.7)
Neutrophils Relative %: 74 % (ref 43–77)
Platelets: 357 10*3/uL (ref 150–400)
RBC: 4.58 MIL/uL (ref 3.87–5.11)
RDW: 14.9 % (ref 11.5–15.5)
WBC: 8 10*3/uL (ref 4.0–10.5)

## 2013-06-25 LAB — COMPREHENSIVE METABOLIC PANEL
ALBUMIN: 3 g/dL — AB (ref 3.5–5.2)
ALT: 19 U/L (ref 0–35)
AST: 22 U/L (ref 0–37)
Alkaline Phosphatase: 87 U/L (ref 39–117)
BILIRUBIN TOTAL: 0.5 mg/dL (ref 0.3–1.2)
BUN: 11 mg/dL (ref 6–23)
CHLORIDE: 78 meq/L — AB (ref 96–112)
CO2: 41 mEq/L (ref 19–32)
CREATININE: 0.4 mg/dL — AB (ref 0.50–1.10)
Calcium: 8.9 mg/dL (ref 8.4–10.5)
GFR calc Af Amer: >90 mL/min (ref >90)
GFR calc non Af Amer: >90 mL/min (ref >90)
Glucose, Bld: 101 mg/dL — ABNORMAL HIGH (ref 70–99)
Potassium: 5 mEq/L (ref 3.7–5.3)
Sodium: 123 mEq/L — ABNORMAL LOW (ref 137–147)
TOTAL PROTEIN: 7.3 g/dL (ref 6.0–8.3)

## 2013-06-25 LAB — URINE MICROSCOPIC-ADD ON

## 2013-06-25 LAB — POCT I-STAT 3, ART BLOOD GAS (G3+)
ACID-BASE EXCESS: 10 mmol/L — AB (ref 0.0–2.0)
Acid-Base Excess: 11 mmol/L — ABNORMAL HIGH (ref 0.0–2.0)
BICARBONATE: 43.5 meq/L — AB (ref 20.0–24.0)
Bicarbonate: 44.9 mEq/L — ABNORMAL HIGH (ref 20.0–24.0)
O2 SAT: 100 %
O2 Saturation: 100 %
PCO2 ART: 109.6 mmHg — AB (ref 35.0–45.0)
PH ART: 7.221 — AB (ref 7.350–7.450)
Patient temperature: 98.6
Patient temperature: 98.6
TCO2: 47 mmol/L (ref 0–100)
TCO2: 48 mmol/L (ref 0–100)
pCO2 arterial: 104.6 mmHg (ref 35.0–45.0)
pH, Arterial: 7.227 — ABNORMAL LOW (ref 7.350–7.450)
pO2, Arterial: 267 mmHg — ABNORMAL HIGH (ref 80.0–100.0)
pO2, Arterial: 236 mmHg — ABNORMAL HIGH (ref 80.0–100.0)

## 2013-06-25 LAB — BLOOD GAS, ARTERIAL
Acid-Base Excess: 13.5 mmol/L — ABNORMAL HIGH (ref 0.0–2.0)
Bicarbonate: 40.3 mEq/L — ABNORMAL HIGH (ref 20.0–24.0)
DELIVERY SYSTEMS: POSITIVE
Drawn by: 347621
FIO2: 0.4 %
LHR: 15 {breaths}/min
O2 SAT: 99.6 %
PATIENT TEMPERATURE: 98.4
PEEP: 8 cmH2O
PH ART: 7.295 — AB (ref 7.350–7.450)
Pressure control: 8 cmH2O
TCO2: 42.9 mmol/L (ref 0–100)
pCO2 arterial: 85.3 mmHg (ref 35.0–45.0)
pO2, Arterial: 177 mmHg — ABNORMAL HIGH (ref 80.0–100.0)

## 2013-06-25 LAB — URINALYSIS, ROUTINE W REFLEX MICROSCOPIC
GLUCOSE, UA: NEGATIVE mg/dL
Ketones, ur: NEGATIVE mg/dL
LEUKOCYTES UA: NEGATIVE
Nitrite: NEGATIVE
PH: 5.5 (ref 5.0–8.0)
Protein, ur: 30 mg/dL — AB
Specific Gravity, Urine: 1.025 (ref 1.005–1.030)
Urobilinogen, UA: 1 mg/dL (ref 0.0–1.0)

## 2013-06-25 LAB — CG4 I-STAT (LACTIC ACID): Lactic Acid, Venous: 0.71 mmol/L (ref 0.5–2.2)

## 2013-06-25 LAB — PRO B NATRIURETIC PEPTIDE: Pro B Natriuretic peptide (BNP): 3381 pg/mL — ABNORMAL HIGH (ref 0–450)

## 2013-06-25 LAB — PROTIME-INR
INR: 1.6 — ABNORMAL HIGH (ref 0.00–1.49)
Prothrombin Time: 18.6 seconds — ABNORMAL HIGH (ref 11.6–15.2)

## 2013-06-25 LAB — TROPONIN I: Troponin I: <0.3 ng/mL (ref <0.30)

## 2013-06-25 LAB — INFLUENZA PANEL BY PCR (TYPE A & B)
H1N1 flu by pcr: NOT DETECTED
INFLBPCR: NEGATIVE
Influenza A By PCR: NEGATIVE

## 2013-06-25 LAB — MRSA PCR SCREENING: MRSA BY PCR: NEGATIVE

## 2013-06-25 LAB — DIGOXIN LEVEL: Digoxin Level: 0.9 ng/mL (ref 0.8–2.0)

## 2013-06-25 MED ORDER — DEXTROSE 5 % IV SOLN
2.0000 g | Freq: Once | INTRAVENOUS | Status: AC
  Administered 2013-06-25: 2 g via INTRAVENOUS

## 2013-06-25 MED ORDER — SODIUM CHLORIDE 0.9 % IV SOLN
1000.0000 mL | Freq: Once | INTRAVENOUS | Status: AC
  Administered 2013-06-25: 1000 mL via INTRAVENOUS

## 2013-06-25 MED ORDER — ALBUTEROL SULFATE (2.5 MG/3ML) 0.083% IN NEBU
2.5000 mg | INHALATION_SOLUTION | RESPIRATORY_TRACT | Status: DC | PRN
  Filled 2013-06-25: qty 3

## 2013-06-25 MED ORDER — FUROSEMIDE 10 MG/ML IJ SOLN
40.0000 mg | Freq: Once | INTRAMUSCULAR | Status: AC
  Administered 2013-06-25: 40 mg via INTRAVENOUS
  Filled 2013-06-25: qty 4

## 2013-06-25 MED ORDER — VANCOMYCIN HCL 10 G IV SOLR
1500.0000 mg | INTRAVENOUS | Status: DC
  Administered 2013-06-26 – 2013-06-28 (×3): 1500 mg via INTRAVENOUS
  Filled 2013-06-25 (×3): qty 1500

## 2013-06-25 MED ORDER — DEXTROSE 5 % IV SOLN
1.0000 g | Freq: Two times a day (BID) | INTRAVENOUS | Status: DC
  Administered 2013-06-25 – 2013-06-30 (×10): 1 g via INTRAVENOUS
  Filled 2013-06-25 (×12): qty 1

## 2013-06-25 MED ORDER — SODIUM CHLORIDE 0.9 % IJ SOLN
3.0000 mL | Freq: Two times a day (BID) | INTRAMUSCULAR | Status: DC
  Administered 2013-06-25 – 2013-07-02 (×13): 3 mL via INTRAVENOUS

## 2013-06-25 MED ORDER — HEPARIN SODIUM (PORCINE) 5000 UNIT/ML IJ SOLN
5000.0000 [IU] | Freq: Three times a day (TID) | INTRAMUSCULAR | Status: DC
  Administered 2013-06-25 – 2013-06-26 (×2): 5000 [IU] via SUBCUTANEOUS
  Filled 2013-06-25 (×5): qty 1

## 2013-06-25 MED ORDER — FUROSEMIDE 10 MG/ML IJ SOLN
20.0000 mg | Freq: Two times a day (BID) | INTRAMUSCULAR | Status: DC
  Administered 2013-06-25: 20 mg via INTRAVENOUS
  Filled 2013-06-25 (×3): qty 2

## 2013-06-25 MED ORDER — ACETAMINOPHEN 650 MG RE SUPP: 650.0000 mg | Freq: Four times a day (QID) | RECTAL | Status: DC | PRN

## 2013-06-25 MED ORDER — SODIUM CHLORIDE 0.9 % IV SOLN
1000.0000 mL | INTRAVENOUS | Status: DC
  Administered 2013-06-25: 1000 mL via INTRAVENOUS

## 2013-06-25 MED ORDER — METOPROLOL TARTRATE 1 MG/ML IV SOLN
5.0000 mg | Freq: Four times a day (QID) | INTRAVENOUS | Status: DC
  Administered 2013-06-25 – 2013-06-29 (×15): 5 mg via INTRAVENOUS
  Filled 2013-06-25 (×17): qty 5

## 2013-06-25 MED ORDER — ALBUTEROL SULFATE (2.5 MG/3ML) 0.083% IN NEBU
2.5000 mg | INHALATION_SOLUTION | Freq: Four times a day (QID) | RESPIRATORY_TRACT | Status: DC
  Administered 2013-06-25 (×2): 2.5 mg via RESPIRATORY_TRACT
  Filled 2013-06-25: qty 3

## 2013-06-25 MED ORDER — ONDANSETRON HCL 4 MG/2ML IJ SOLN: 4.0000 mg | Freq: Four times a day (QID) | INTRAMUSCULAR | Status: DC | PRN

## 2013-06-25 MED ORDER — IPRATROPIUM BROMIDE 0.02 % IN SOLN: 0.5000 mg | Freq: Four times a day (QID) | RESPIRATORY_TRACT | Status: DC

## 2013-06-25 MED ORDER — ACETAMINOPHEN 325 MG PO TABS: 650.0000 mg | ORAL_TABLET | Freq: Four times a day (QID) | ORAL | Status: DC | PRN

## 2013-06-25 MED ORDER — ONDANSETRON HCL 4 MG PO TABS: 4.0000 mg | ORAL_TABLET | Freq: Four times a day (QID) | ORAL | Status: DC | PRN

## 2013-06-25 MED ORDER — VANCOMYCIN HCL IN DEXTROSE 1-5 GM/200ML-% IV SOLN
1000.0000 mg | Freq: Once | INTRAVENOUS | Status: AC
  Administered 2013-06-25: 1000 mg via INTRAVENOUS
  Filled 2013-06-25: qty 200

## 2013-06-25 NOTE — ED Notes (Signed)
resp therapy notified of treatment order

## 2013-06-25 NOTE — Progress Notes (Signed)
Came back to speak with family regarding the repeat ABG- at this time they wish to continue treatment- she did this in the past and it took a while to correct.  Eulogio Bear DO

## 2013-06-25 NOTE — ED Notes (Signed)
Dr. Eliseo Squires at bedside.  Family wanting to continue BiPap.

## 2013-06-25 NOTE — ED Notes (Signed)
Pt to department from [facility] via EMS- pt was placed on Tamiflu on Friday. Pt found increased SOB, and unknown how long pt has been in this condition. Bp-165/86 Hr-88 Staff reports that pt felt warm to touch.

## 2013-06-25 NOTE — H&P (Signed)
[facility] History and Physical  Jennifer Nielsen SHF:[id number] DOB: [birth year] DOA: 06/25/2013  Referring physician: er PCP: Jennifer Flesher, MD   Chief Complaint: unresponsive  HPI: Jennifer Nielsen is a 80 y.o. female  Who comes in unresponsive from [facility].  Per family, she was SOB last week, they placed her on oxygen and she felt better until yesterday she was sleepy and would not wake up.  Facility not sure how long she was unresponsive.  There was question of flu as she is from a facility but family said she tested negative.  A few weeks ago her legs began swelling.  She was recently switched to xarelto from coumadin as well.  She was recently started on levaquin as well.    In the ER, the sepsis protocol was implemented. Patient was given IVF and abx  Upon my exam, she has LE swelling, x ray shows fluid- in rapid a fib- suspect she has fluid overload and need duiresis, +/- abx.     Review of Systems:  All systems reviewed, negative unless stated above  Past Medical History  Diagnosis Date  . Arrhythmia   . Arthritis   . Atrial fibrillation     Remains stable (As of 05/09/13)  . H/O blood clots   . Hyperlipemia   . Dementia     Remains stable & continues to function adequately in the current living environment with supervision. Has had little changes in behavior (AS OF 05/09/13)  . GERD (gastroesophageal reflux disease)     Stable (As of 05/09/13)  . Osteoarthritis     Denies pain (As of 05/09/13)   Past Surgical History  Procedure Laterality Date  . Appendectomy    . Gallbladder surgery    . Hip surgery      Right-Metal plate   . Leg surgery      Left leg   Social History:  reports that she has quit smoking. She has never used smokeless tobacco. She reports that she does not drink alcohol or use illicit drugs.  Allergies  Allergen Reactions  . Chocolate     unknown  . Ciprofloxacin     unknown  . Coffee Bean Extract [Coffea Arabica]     unknown  .  Gentamycin [Gentamicin]     unknown  . Imodium [Loperamide]     unknown  . Penicillins     unknown  . Sulfa Antibiotics     unknown  . Tea     unknown    Family History  Problem Relation Age of Onset  . Colon cancer Neg Hx   . Heart disease Father   . Breast cancer Daughter      Prior to Admission medications   Medication Sig Start Date End Date Taking? Authorizing Provider  acetaminophen (TYLENOL) 500 MG tablet Take 500 mg by mouth 3 (three) times daily.    Yes Historical Provider, MD  albuterol (PROVENTIL) (2.5 MG/3ML) 0.083% nebulizer solution Take 2.5 mg by nebulization every 6 (six) hours as needed for wheezing or shortness of breath. For 3 days; started on 06-23-13   Yes Historical Provider, MD  Calcium Carbonate-Vitamin D (CALTRATE 600+D PO) Take 1 tablet by mouth every morning.    Yes Historical Provider, MD  digoxin (LANOXIN) 0.125 MG tablet Take 0.125 mg by mouth every Tuesday, Thursday, Saturday, and Sunday.   Yes Historical Provider, MD  digoxin (LANOXIN) 0.25 MG tablet Take 0.25 mg by mouth every Monday, Wednesday, and Friday.    Yes Historical Provider,  MD  diltiazem (DILACOR XR) 240 MG 24 hr capsule Take 240 mg by mouth daily.   Yes Historical Provider, MD  divalproex (DEPAKOTE SPRINKLE) 125 MG capsule Take 125 mg by mouth 2 (two) times daily.   Yes Historical Provider, MD  furosemide (LASIX) 40 MG tablet Take 40 mg by mouth daily.   Yes Historical Provider, MD  guaiFENesin (MUCINEX) 600 MG 12 hr tablet Take 600 mg by mouth every 12 (twelve) hours. For 2 weeks; started on 06-22-13   Yes Historical Provider, MD  guaiFENesin-dextromethorphan (ROBITUSSIN DM) 100-10 MG/5ML syrup Take 10 mLs by mouth 3 (three) times daily.   Yes Historical Provider, MD  hydrOXYzine (ATARAX/VISTARIL) 25 MG tablet Take 25 mg by mouth every 8 (eight) hours as needed for itching.   Yes Historical Provider, MD  levofloxacin (LEVAQUIN) 750 MG tablet Take 750 mg by mouth daily. For 7 days; ordered on  06-23-13   Yes Historical Provider, MD  OLANZapine (ZYPREXA) 2.5 MG tablet Take 2.5 mg by mouth every evening.   Yes Historical Provider, MD  omeprazole (PRILOSEC) 20 MG capsule Take 20 mg by mouth daily before breakfast.    Yes Historical Provider, MD  Polyethyl Glycol-Propyl Glycol (SYSTANE OP) Place 1 drop into both eyes 2 (two) times daily.   Yes Historical Provider, MD  potassium chloride (K-DUR) 10 MEQ tablet Take 10 mEq by mouth daily.   Yes Historical Provider, MD  Rivaroxaban (XARELTO) 20 MG TABS tablet Take 20 mg by mouth daily.   Yes Historical Provider, MD  saccharomyces boulardii (FLORASTOR) 250 MG capsule Take 250 mg by mouth 2 (two) times daily. For 7 days; ordered on 06-23-13   Yes Historical Provider, MD   Physical Exam: Filed Vitals:   06/25/13 1300  BP: 156/55  Pulse: 133  Resp: 24    BP 156/55  Pulse 133  Resp 24  SpO2 96%  General:  Elderly on bipap Eyes: PERRL, normal lids, irises & conjunctiva ENT: on Bipap Neck: no LAD, masses or thyromegaly Cardiovascular: irregular and fast  Respiratory: coarse breath sounds, no wheezing Abdomen: soft, ntnd Skin: +edmea b/l LE Musculoskeletal: obese female, not following commands Psychiatric: not responsive Neurologic: not responsive          Labs on Admission:  Basic Metabolic Panel:  Recent Labs Lab 06/25/13 1135  NA 123*  K 5.0  CL 78*  CO2 41*  GLUCOSE 101*  BUN 11  CREATININE 0.40*  CALCIUM 8.9   Liver Function Tests:  Recent Labs Lab 06/25/13 1135  AST 22  ALT 19  ALKPHOS 87  BILITOT 0.5  PROT 7.3  ALBUMIN 3.0*   No results found for this basename: LIPASE, AMYLASE,  in the last 168 hours No results found for this basename: AMMONIA,  in the last 168 hours CBC:  Recent Labs Lab 06/25/13 1135  WBC 8.0  NEUTROABS 5.9  HGB 13.7  HCT 41.3  MCV 90.2  PLT 357   Cardiac Enzymes: No results found for this basename: CKTOTAL, CKMB, CKMBINDEX, TROPONINI,  in the last 168 hours  BNP (last  3 results) No results found for this basename: PROBNP,  in the last 8760 hours CBG: No results found for this basename: GLUCAP,  in the last 168 hours  Radiological Exams on Admission: Dg Chest Port 1 View  06/25/2013   CLINICAL DATA:  Shortness of breath  EXAM: PORTABLE CHEST - 1 VIEW  COMPARISON:  None.  FINDINGS: 1123 hrs. The cardio pericardial silhouette is enlarged. There  is pulmonary vascular congestion without overt pulmonary edema. Superimposed interstitial opacities suggest edema. Probable tiny bilateral pleural effusions. Bones are diffusely demineralized. Telemetry leads overlie the chest.  IMPRESSION: Cardiomegaly with vascular congestion and interstitial edema.   Electronically Signed   By: Misty Stanley M.D.   On: 06/25/2013 12:09      Assessment/Plan Active Problems:   Atrial fibrillation   CHF (congestive heart failure), NYHA class III   Acute respiratory failure with hypercapnia   PNA (pneumonia)   Hyponatremia   1. Acute resp failure with hypercapnia- is DNR, will place on Bipap- risks of aspiration discussed with family.  IV steroids, IV abx, nebs PRN and scheduled-- recheck ABG in several hours- I suspect that this is more fluid over load, will get BNP and IV lasix will be given-- IF er weight is accurate, she is 35 pounds heavier than last admission 2. ?PNA vs CHF- x ray shows vascular congestion; no WBC count, no fevers; check echo- given lasix IV, continue abx for now 3. A fib with RVR- may need Caredizem gtt- will use lopressor for now, not able to take PO so may need heparin gtt if respiratory status recovers 4. Hyponatremia- IV lasix, appears to be volume overloaded 5. Dementia- stable 6. DNR    Code Status: DNR Family Communication: daughters at bedside Disposition Plan:   Time spent: 76 min  Eulogio Bear [facility] Pager [phone]

## 2013-06-25 NOTE — Progress Notes (Addendum)
ANTIBIOTIC CONSULT NOTE - INITIAL  Pharmacy Consult for vancomycin and cefepime Indication: rule out sepsis  Allergies  Allergen Reactions  . Chocolate     unknown  . Ciprofloxacin     unknown  . Coffee Bean Extract [Coffea Arabica]     unknown  . Gentamycin [Gentamicin]     unknown  . Imodium [Loperamide]     unknown  . Penicillins     unknown  . Sulfa Antibiotics     unknown  . Tea     unknown    Patient Measurements:    Body Weight: 102.1 kg  Vital Signs: BP: 159/88 mmHg (01/12 1121) Pulse Rate: 99 (01/12 1121) Intake/Output from previous day:   Intake/Output from this shift:    Labs: No results found for this basename: WBC, HGB, PLT, LABCREA, CREATININE,  in the last 72 hours The CrCl is unknown because both a height and weight (above a minimum accepted value) are required for this calculation. No results found for this basename: VANCOTROUGH, VANCOPEAK, VANCORANDOM, GENTTROUGH, GENTPEAK, GENTRANDOM, TOBRATROUGH, TOBRAPEAK, TOBRARND, AMIKACINPEAK, AMIKACINTROU, AMIKACIN,  in the last 72 hours   Microbiology: No results found for this or any previous visit (from the past 720 hour(s)).  Medical History: Past Medical History  Diagnosis Date  . Arrhythmia   . Arthritis   . Atrial fibrillation     Remains stable (As of 05/09/13)  . H/O blood clots   . Hyperlipemia   . Dementia     Remains stable & continues to function adequately in the current living environment with supervision. Has had little changes in behavior (AS OF 05/09/13)  . GERD (gastroesophageal reflux disease)     Stable (As of 05/09/13)  . Osteoarthritis     Denies pain (As of 05/09/13)    Medications:  See med rec Assessment: 80 yo lady admitted from nursing home with SOB.  She was recently treated with tamiflu and levaquin.  She has a dose of vancomycin and cefepime ordered in the ED.  Goal of Therapy:  Vancomycin trough level 15-20 mcg/ml Eradication of infection  Plan:  Give an  additional 1 gm vanc for a total load of 2gm. F/u renal function for futher dosing    Janus Vlcek Poteet 06/25/2013,11:57 AM  SrCr 0.4   SrCr likely not a good predictor of renal function.  Est CrCl ~50 ml/min Cefepime 1 gm IV q12 hours Vancomycin 1500 mg IV q24 hours based on obesity nomogram F/u clinical course and cultures.

## 2013-06-25 NOTE — Progress Notes (Signed)
Unit CM UR Completed by [facility] ED CM  W. Ridhima Golberg RN  

## 2013-06-25 NOTE — ED Notes (Signed)
Pt resp more shallow-using more abd muscles when breathing. Dr. Eliseo Squires notified. Orders received.

## 2013-06-25 NOTE — ED Provider Notes (Signed)
CSN: [id number]     Arrival date & time 06/25/13  1101 History   First MD Initiated Contact with Patient 06/25/13 1104     Chief Complaint  Patient presents with  . Shortness of Breath   (Consider location/radiation/quality/duration/timing/severity/associated sxs/prior Treatment) Patient is a 80 y.o. female presenting with shortness of breath. The history is provided by the EMS personnel and medical records.  Shortness of Breath Severity:  Severe Onset quality:  Gradual Timing:  Constant Progression:  Worsening Chronicity:  New Context comment:  Recent illness - recent diagnosis of influenza/PNA Relieved by:  Nothing Worsened by:  Nothing tried Associated symptoms: cough   Associated symptoms: no abdominal pain, no chest pain, no fever and no vomiting     Past Medical History  Diagnosis Date  . Arrhythmia   . Arthritis   . Atrial fibrillation     Remains stable (As of 05/09/13)  . H/O blood clots   . Hyperlipemia   . Dementia     Remains stable & continues to function adequately in the current living environment with supervision. Has had little changes in behavior (AS OF 05/09/13)  . GERD (gastroesophageal reflux disease)     Stable (As of 05/09/13)  . Osteoarthritis     Denies pain (As of 05/09/13)   Past Surgical History  Procedure Laterality Date  . Appendectomy    . Gallbladder surgery    . Hip surgery      Right-Metal plate   . Leg surgery      Left leg   Family History  Problem Relation Age of Onset  . Colon cancer Neg Hx   . Heart disease Father   . Breast cancer Daughter    History  Substance Use Topics  . Smoking status: Former Research scientist (life sciences)  . Smokeless tobacco: Never Used  . Alcohol Use: No   OB History   Grav Para Term Preterm Abortions TAB SAB Ect Mult Living                 Review of Systems  Constitutional: Negative for fever.  Respiratory: Positive for cough and shortness of breath.   Cardiovascular: Negative for chest pain.   Gastrointestinal: Negative for vomiting and abdominal pain.  All other systems reviewed and are negative.    Allergies  Imodium; Penicillins; and Sulfa antibiotics  Home Medications   Current Outpatient Rx  Name  Route  Sig  Dispense  Refill  . acetaminophen (TYLENOL) 500 MG tablet   Oral   Take 500 mg by mouth every 6 (six) hours as needed.         . Calcium Carbonate-Vitamin D (CALTRATE 600+D PO)   Oral   Take by mouth daily.         . digoxin (LANOXIN) 0.25 MG tablet   Oral   Take by mouth daily.          Marland Kitchen diltiazem (DILACOR XR) 240 MG 24 hr capsule   Oral   Take 240 mg by mouth daily.         . divalproex (DEPAKOTE) 125 MG DR tablet   Oral   Take 125 mg by mouth 2 (two) times daily.         . furosemide (LASIX) 40 MG tablet   Oral   Take 1 tablet (40 mg total) by mouth daily.   30 tablet   11   . omeprazole (PRILOSEC) 20 MG capsule   Oral   Take 20 mg by mouth daily.         Marland Kitchen  potassium chloride (K-DUR) 10 MEQ tablet   Oral   Take 1 tablet (10 mEq total) by mouth daily.   30 tablet   11   . Protein (PROCEL) POWD   Oral   Take by mouth as directed.         . risperiDONE (RISPERDAL) 0.25 MG tablet   Oral   Take 0.25 mg by mouth 2 (two) times daily.         . Rivaroxaban (XARELTO) 20 MG TABS tablet   Oral   Take 1 tablet (20 mg total) by mouth daily with supper.   30 tablet   11   . warfarin (COUMADIN) 2.5 MG tablet   Oral   Take 1 tablet (2.5 mg total) by mouth daily.   7 tablet   0    BP 166/88  Pulse 111  Resp 20  SpO2 99% Physical Exam  Nursing note and vitals reviewed. Constitutional: She is oriented to person, place, and time. She appears well-developed and well-nourished. She appears distressed.  HENT:  Head: Normocephalic and atraumatic.  Eyes: EOM are normal. Pupils are equal, round, and reactive to light.  Neck: Normal range of motion. Neck supple.  Cardiovascular: Normal rate and regular rhythm.  Exam  reveals no friction rub.   No murmur heard. Pulmonary/Chest: She is in respiratory distress. She has decreased breath sounds (diffuse). She has wheezes (diffuse). She has rhonchi (diffuse). She has no rales.  Abdominal: Soft. She exhibits no distension. There is no tenderness. There is no rebound.  Musculoskeletal: Normal range of motion. She exhibits no edema.  Neurological: She is alert and oriented to person, place, and time.  Skin: She is not diaphoretic.   LEVEL 5 CAVEAT, ALTERED MENTAL STATUS  ED Course  Procedures (including critical care time) Labs Review Labs Reviewed  CULTURE, BLOOD (ROUTINE X 2)  CULTURE, BLOOD (ROUTINE X 2)  URINE CULTURE  CBC WITH DIFFERENTIAL  COMPREHENSIVE METABOLIC PANEL  URINALYSIS, ROUTINE W REFLEX MICROSCOPIC  BLOOD GAS, ARTERIAL   Imaging Review No results found.  EKG Interpretation    Date/Time:  Monday June 25 2013 14:39:50 EST Ventricular Rate:  88 PR Interval:    QRS Duration: 80 QT Interval:  435 QTC Calculation: 526 R Axis:   120 Text Interpretation:  Atrial fibrillation Anterior infarct, old Borderline T abnormalities, inferior leads Prolonged QT interval No prior EKG Confirmed by Mingo Amber  MD, [place] ([id number]) on 06/26/2013 8:11:56 AM          CRITICAL CARE Performed by: Osvaldo Shipper   Total critical care time: 30 minutes  Critical care time was exclusive of separately billable procedures and treating other patients.  Critical care was necessary to treat or prevent imminent or life-threatening deterioration.  Critical care was time spent personally by me on the following activities: development of treatment plan with patient and/or surrogate as well as nursing, discussions with consultants, evaluation of patient's response to treatment, examination of patient, obtaining history from patient or surrogate, ordering and performing treatments and interventions, ordering and review of laboratory studies, ordering and  review of radiographic studies, pulse oximetry and re-evaluation of patient's condition.   MDM   1. Acute respiratory failure with hypercapnia   2. Atrial fibrillation   3. Hyponatremia   4. PNA (pneumonia)    80 year old female presents with shortness of breath. She's recently seen and discharged with a diagnosis of flu and pneumonia. She was stopped her Tamiflu but her Levaquin was continued. Nursing home is  unaware when she became less responsive, was unresponsive this morning. Patient satting in the mid 90s with by department upon initial arrival, EMS placed her on a breathing treatment. On arrival here, she is mildly tachycardic, normotensive, satting 100% on 6 L by nonrebreather through a breathing treatment. She will talk to see this will not follow commands. She does have a DO NOT RESUSCITATE at this time. She does feel warm to touch. Sepsis protocol initiated for her altered level consciousness. Concern for pneumonia versus other sources such as a UTI. Patient started on broad spectrum antibiotics. ABG shows acute respiratory acidosis, placed on BiPap - as pH was 7.22 and pCO2 was in the 70s. I explained all this to the family. Patient admitted to medicine.   Osvaldo Shipper, MD 06/26/13 [phone]

## 2013-06-26 DIAGNOSIS — R600 Localized edema: Secondary | ICD-10-CM | POA: Diagnosis present

## 2013-06-26 DIAGNOSIS — I517 Cardiomegaly: Secondary | ICD-10-CM | POA: Diagnosis not present

## 2013-06-26 DIAGNOSIS — G9341 Metabolic encephalopathy: Principal | ICD-10-CM | POA: Diagnosis present

## 2013-06-26 DIAGNOSIS — R609 Edema, unspecified: Secondary | ICD-10-CM

## 2013-06-26 DIAGNOSIS — E878 Other disorders of electrolyte and fluid balance, not elsewhere classified: Secondary | ICD-10-CM | POA: Diagnosis present

## 2013-06-26 DIAGNOSIS — J9600 Acute respiratory failure, unspecified whether with hypoxia or hypercapnia: Secondary | ICD-10-CM | POA: Diagnosis not present

## 2013-06-26 DIAGNOSIS — I4891 Unspecified atrial fibrillation: Secondary | ICD-10-CM | POA: Diagnosis not present

## 2013-06-26 LAB — COMPREHENSIVE METABOLIC PANEL
ALT: 16 U/L (ref 0–35)
AST: 38 U/L — AB (ref 0–37)
Albumin: 2.4 g/dL — ABNORMAL LOW (ref 3.5–5.2)
Alkaline Phosphatase: 62 U/L (ref 39–117)
BUN: 8 mg/dL (ref 6–23)
CALCIUM: 8.4 mg/dL (ref 8.4–10.5)
CO2: 40 mEq/L (ref 19–32)
Chloride: 78 mEq/L — ABNORMAL LOW (ref 96–112)
Creatinine, Ser: 0.36 mg/dL — ABNORMAL LOW (ref 0.50–1.10)
GFR calc non Af Amer: >90 mL/min (ref >90)
GLUCOSE: 87 mg/dL (ref 70–99)
Potassium: 5.2 mEq/L (ref 3.7–5.3)
SODIUM: 124 meq/L — AB (ref 137–147)
TOTAL PROTEIN: 6.1 g/dL (ref 6.0–8.3)
Total Bilirubin: 0.5 mg/dL (ref 0.3–1.2)

## 2013-06-26 LAB — DIGOXIN LEVEL: Digoxin Level: 0.7 ng/mL — ABNORMAL LOW (ref 0.8–2.0)

## 2013-06-26 LAB — URINE CULTURE
CULTURE: NO GROWTH
Colony Count: NO GROWTH

## 2013-06-26 LAB — CBC
HEMATOCRIT: 38.8 % (ref 36.0–46.0)
HEMOGLOBIN: 12.3 g/dL (ref 12.0–15.0)
MCH: 29.5 pg (ref 26.0–34.0)
MCHC: 31.7 g/dL (ref 30.0–36.0)
MCV: 93 fL (ref 78.0–100.0)
Platelets: 308 10*3/uL (ref 150–400)
RBC: 4.17 MIL/uL (ref 3.87–5.11)
RDW: 14.8 % (ref 11.5–15.5)
WBC: 6.7 10*3/uL (ref 4.0–10.5)

## 2013-06-26 LAB — TROPONIN I: Troponin I: <0.3 ng/mL (ref <0.30)

## 2013-06-26 MED ORDER — ENOXAPARIN SODIUM 100 MG/ML ~~LOC~~ SOLN
100.0000 mg | Freq: Two times a day (BID) | SUBCUTANEOUS | Status: DC
  Administered 2013-06-26 – 2013-06-28 (×6): 100 mg via SUBCUTANEOUS
  Filled 2013-06-26 (×9): qty 1

## 2013-06-26 MED ORDER — DIGOXIN 0.25 MG/ML IJ SOLN
0.2500 mg | INTRAMUSCULAR | Status: DC
  Filled 2013-06-26: qty 1

## 2013-06-26 MED ORDER — DIGOXIN 0.25 MG/ML IJ SOLN
0.1250 mg | INTRAMUSCULAR | Status: DC
  Administered 2013-06-26: 0.125 mg via INTRAVENOUS
  Filled 2013-06-26 (×4): qty 0.5

## 2013-06-26 MED ORDER — ALBUTEROL SULFATE (2.5 MG/3ML) 0.083% IN NEBU: 2.5000 mg | INHALATION_SOLUTION | Freq: Four times a day (QID) | RESPIRATORY_TRACT | Status: DC | PRN

## 2013-06-26 MED ORDER — FUROSEMIDE 10 MG/ML IJ SOLN
40.0000 mg | Freq: Two times a day (BID) | INTRAMUSCULAR | Status: DC
  Administered 2013-06-26 – 2013-06-27 (×4): 40 mg via INTRAVENOUS
  Filled 2013-06-26 (×5): qty 4

## 2013-06-26 NOTE — Progress Notes (Signed)
Patient maintaining O2 sat >98%.  Venturi mask O2 reduced to 8L (40%) at this time. Patient tolerating the change well and maintaining O2 sats at 98%. Will continue to monitor and attempt to wean.

## 2013-06-26 NOTE — Progress Notes (Signed)
Echocardiogram 2D Echocardiogram has been performed.  [name] 06/26/2013, 11:05 AM

## 2013-06-26 NOTE — Progress Notes (Signed)
[facility] Progress Note    Jennifer Nielsen UYQ:[id number] DOB: Mar 21, 1925 DOA: 06/25/2013 PCP: [name], MD  Brief narrative: 80 year old female patient who resides at [facility]. Sent to the ER because of unresponsive state. According to the family patient had shortness of breath for the past 7 days. The facility placed on oxygen with improvement in symptoms until the day before presentation when patient became sleepy and would not wake up. According to the admission note the facility was not sure how long the patient was actually unresponsive. Apparently an influenza screen was checked at facility which was negative. She has had several weeks of progressive lower extremity edema. She did follow up with Dr. Meda Coffee with cardiology and was recently switched to Xarelto from Coumadin and ECHO planned as OP. Recently started on Levaquin for presumed pneumonia.  Despite being normotensive patient was mildly tachycardic and hypoxic requiring 6 L of oxygen, because of her altered level of consciousness the sepsis protocol was initiated. ABG demonstrated respiratory acidosis with a pH of 7.22 and a PCO2 in the 70s. She was subsequently placed on BiPAP. Telemetry revealed atrial fibrillation with RVR. She also had extensive 3-4+ bilateral lower extremity edema.  Assessment/Plan: Active Problems:  Acute respiratory failure with hypercapnia:   A) CHF with unknown LVEF   B) PNA (pneumonia) -IVFs initiated in ER- dc'd -increase Lasix to 40 mg IV BID -ECHO this admit c/w preserved LV systolic function with mild LVH-EF 50-60%, no regional wall motion abnormalities, moderate dilatation left atrium, mild dilatation right ventricle, mild dilatation right atrium, mild to moderate pulmonary hypertension with a peak PA pressure 41 mmHg -No focal infiltrate CXR-doubt PNA although patient does have productive cough at facility such as precaution continue antibiotic   Abnormal blood cx's -1/2 bottles positive  for GPCs in clusters- likely contaminant but continue Vanco until cx final    Metabolic encephalopathy -Remains lethargic despite improvement in PCO2 -Follow -SLP eval before allow diet    Atrial fibrillation -Rate controlled -too lethargic for all medications so IV Lopressor scheduled -tremulous so check digoxin level before resume-noted to be low (0.7) -planned to resume but IV dose not available due to back order so will defer use of this drug until pt alert enough for POs    Hyponatremia/Hypochloremia -Consistent with volume overload -Follow lab    Long term (current) use of anticoagulants  -Xarelto preadmission -Low likelihood of DVT (see below)    Senile dementia, uncomplicated    Bilateral lower extremity edema -Diurese as above -Echo does not explain the degree of edema therefore we'll check LE venous duplex studies   DVT prophylaxis: Too lethargic for Xarelto- currently on subcutaneous heparin every 8 hours for since unable to take Xarelto will have pharmacy begin full dose Lovenox Code Status:  DO NOT RESUSCITATE Family Communication: With daughter at bedside Disposition Plan/Expected LOS: Stepdown do to BiPAP and continued significant lethargy   Consultants: None  Procedures: 2-D echocardiogram pending  Antibiotics: Maxipime 1/12 >>> Vancomycin 1/12 >>>  HPI/Subjective: Patient remains lethargic although is arousable. No complaints verbalized  Objective: Blood pressure 120/65, pulse 73, temperature 98.4 F (36.9 C), temperature source Oral, resp. rate 18, height 5\' 3"  (1.6 m), weight 226 lb 13.7 oz (102.9 kg), SpO2 99.00%.  Intake/Output Summary (Last 24 hours) at 06/26/13 1113 Last data filed at 06/26/13 0411  Gross per 24 hour  Intake   1203 ml  Output   1875 ml  Net   -672 ml     Exam: General:  Mild respiratory distress as evidenced by continued need for BiPAP Lungs: Coarse to auscultation bilaterally with crackles, 40% FIO2 via  BiPAP Cardiovascular: Irregular rate and rhythm without murmur gallop or rub normal S1 and S2, 3-4+ bilateral peripheral edema-thick neck so difficult to determine if true JVD Abdomen: Nontender, nondistended, soft, bowel sounds positive, no rebound, no ascites, no appreciable mass Musculoskeletal: No significant cyanosis, clubbing of bilateral lower extremities Neurological: Lethargic, moves all extremities x 4 spontaneously  Scheduled Meds:  Scheduled Meds: . ceFEPime (MAXIPIME) IV  1 g Intravenous Q12H  . furosemide  40 mg Intravenous Q12H  . heparin  5,000 Units Subcutaneous Q8H  . metoprolol  5 mg Intravenous Q6H  . sodium chloride  3 mL Intravenous Q12H  . vancomycin  1,500 mg Intravenous Q24H   Continuous Infusions:   **Reviewed in detail by the Attending Physician  Data Reviewed: Basic Metabolic Panel:  Recent Labs Lab 06/25/13 1135 06/26/13 0235  NA 123* 124*  K 5.0 5.2  CL 78* 78*  CO2 41* 40*  GLUCOSE 101* 87  BUN 11 8  CREATININE 0.40* 0.36*  CALCIUM 8.9 8.4   Liver Function Tests:  Recent Labs Lab 06/25/13 1135 06/26/13 0235  AST 22 38*  ALT 19 16  ALKPHOS 87 62  BILITOT 0.5 0.5  PROT 7.3 6.1  ALBUMIN 3.0* 2.4*   No results found for this basename: LIPASE, AMYLASE,  in the last 168 hours No results found for this basename: AMMONIA,  in the last 168 hours CBC:  Recent Labs Lab 06/25/13 1135 06/26/13 0235  WBC 8.0 6.7  NEUTROABS 5.9  --   HGB 13.7 12.3  HCT 41.3 38.8  MCV 90.2 93.0  PLT 357 308   Cardiac Enzymes:  Recent Labs Lab 06/25/13 2115 06/26/13 0235 06/26/13 0840  TROPONINI <0.30 <0.30 <0.30   BNP (last 3 results)  Recent Labs  06/25/13 1337  PROBNP 3381.0*   CBG: No results found for this basename: GLUCAP,  in the last 168 hours  Recent Results (from the past 240 hour(s))  CULTURE, BLOOD (ROUTINE X 2)     Status: None   Collection Time    06/25/13 11:40 AM      Result Value Range Status   Specimen Description  BLOOD RIGHT ANTECUBITAL   Final   Special Requests BOTTLES DRAWN AEROBIC ONLY 5MLS   Final   Culture  Setup Time     Final   Value: 06/25/2013 16:02     Performed at [facility]   Culture     Final   Value:        BLOOD CULTURE RECEIVED NO GROWTH TO DATE CULTURE WILL BE HELD FOR 5 DAYS BEFORE ISSUING A FINAL NEGATIVE REPORT     Performed at [facility]   Report Status PENDING   Incomplete  CULTURE, BLOOD (ROUTINE X 2)     Status: None   Collection Time    06/25/13 11:45 AM      Result Value Range Status   Specimen Description BLOOD RIGHT HAND   Final   Special Requests BOTTLES DRAWN AEROBIC ONLY 5MLS   Final   Culture  Setup Time     Final   Value: 06/25/2013 16:03     Performed at [facility]   Culture     Final   Value:        BLOOD CULTURE RECEIVED NO GROWTH TO DATE CULTURE WILL BE HELD FOR 5 DAYS BEFORE ISSUING A  FINAL NEGATIVE REPORT     Performed at [facility]   Report Status PENDING   Incomplete  MRSA PCR SCREENING     Status: None   Collection Time    06/25/13  8:20 PM      Result Value Range Status   MRSA by PCR NEGATIVE  NEGATIVE Final   Comment:            The GeneXpert MRSA Assay (FDA     approved for NASAL specimens     only), is one component of a     comprehensive MRSA colonization     surveillance program. It is not     intended to diagnose MRSA     infection nor to guide or     monitor treatment for     MRSA infections.     Studies:  Recent x-ray studies have been reviewed in detail by the Attending Physician  Time spent :   Erin Hearing, [place] [facility] Office  [phone] Pager [phone]  **If unable to reach the above provider after paging please contact the [place] @ [phone]  On-Call/Text Page:      [email or web address]      password [email or web address]  If 7PM-7AM, please contact night-coverage [email or web address] Password [email or web address] 06/26/2013, 11:13 AM   LOS: 1 day   I have examined the patient, reviewed the chart  and modified the above note which I agree with.   Gyasi Hazzard,MD CB:[phone] 06/26/2013, 4:48 PM

## 2013-06-26 NOTE — Progress Notes (Signed)
INITIAL NUTRITION ASSESSMENT  DOCUMENTATION CODES Per approved criteria  -Morbid Obesity   INTERVENTION: 1.  Advance diet as medically appropriate, add supplements as needed  2. Nutritional Management to follow for nutrition care plan  NUTRITION DIAGNOSIS: Inadequate oral intake related to inability to eat as evidenced by NPO status.   Goal: Patient to meet >/=90% of estimated nutrition needs  Monitor:  PO diet advancement & intake, weight, labs, I/Os  Reason for Assessment: Malnutrition Screening Tool, low [place]  80 y.o. female  Admitting Dx: Acute resp failure with hypercapnia   ASSESSMENT: Patient with PMH of dementia, GERD, and Atrial fibrillation;  Presented from [facility] unresponsive; question of flu however tested negative; in ER sepsis protocol initiated. Chest x ray interstitial edema.     Dietetic Intern spoke with patient's sisters at bedside; reported patient is eating well PTA and "eats whatever she wants;" does not receive nutritional supplements at facility; Patient currently NPO on venturi mask; weight has been stable per weight readings; will monitor intake and respiratory status; add supplements as needed.   Height: Ht Readings from Last 1 Encounters:  06/25/13 5\' 3"  (1.6 m)    Weight: Wt Readings from Last 1 Encounters:  06/26/13 226 lb 13.7 oz (102.9 kg)    Ideal Body Weight: 115 lbs  % Ideal Body Weight: 197%  Wt Readings from Last 10 Encounters:  06/26/13 226 lb 13.7 oz (102.9 kg)  06/18/13 225 lb (102.059 kg)  02/20/13 204 lb 9.6 oz (92.806 kg)  11/29/12 197 lb 3.2 oz (89.449 kg)  11/14/12 200 lb 3.2 oz (90.81 kg)  11/07/12 198 lb 3.2 oz (89.903 kg)  10/24/12 203 lb (92.08 kg)  10/18/12 199 lb 3.2 oz (90.357 kg)  10/17/12 200 lb 6.4 oz (90.901 kg)  10/10/12 198 lb 6.4 oz (89.994 kg)    Usual Body Weight: 204 lbs  % Usual Body Weight: 110%  BMI:  Body mass index is 40.2 kg/(m^2).  Estimated Nutritional Needs: Kcal:  1700-1900 Protein: 80-90 Fluid: 1.7-1.9 L  Skin: Intact  Diet Order: NPO  EDUCATION NEEDS: -No education needs identified at this time   Intake/Output Summary (Last 24 hours) at 06/26/13 1206 Last data filed at 06/26/13 1158  Gross per 24 hour  Intake   1209 ml  Output   2725 ml  Net  -1516 ml    Last BM: PTA   Labs:   Recent Labs Lab 06/25/13 1135 06/26/13 0235  NA 123* 124*  K 5.0 5.2  CL 78* 78*  CO2 41* 40*  BUN 11 8  CREATININE 0.40* 0.36*  CALCIUM 8.9 8.4  GLUCOSE 101* 87    CBG (last 3)  No results found for this basename: GLUCAP,  in the last 72 hours  Scheduled Meds: . ceFEPime (MAXIPIME) IV  1 g Intravenous Q12H  . enoxaparin (LOVENOX) injection  100 mg Subcutaneous Q12H  . furosemide  40 mg Intravenous Q12H  . metoprolol  5 mg Intravenous Q6H  . sodium chloride  3 mL Intravenous Q12H  . vancomycin  1,500 mg Intravenous Q24H    Continuous Infusions:   Past Medical History  Diagnosis Date  . Arrhythmia   . Arthritis   . Atrial fibrillation     Remains stable (As of 05/09/13)  . H/O blood clots   . Hyperlipemia   . Dementia     Remains stable & continues to function adequately in the current living environment with supervision. Has had little changes in behavior (AS OF  05/09/13)  . GERD (gastroesophageal reflux disease)     Stable (As of 05/09/13)  . Osteoarthritis     Denies pain (As of 05/09/13)    Past Surgical History  Procedure Laterality Date  . Appendectomy    . Gallbladder surgery    . Hip surgery      Right-Metal plate   . Leg surgery      Left leg    Claudell Kyle, Dietetic Intern  Pager:  [phone]

## 2013-06-26 NOTE — Progress Notes (Addendum)
I agree with the Student-Dietitian note and made appropriate revisions.  Katie Albino Bufford, RD, LDN Pager #: [phone] After-Hours Pager #: [phone]  

## 2013-06-26 NOTE — Progress Notes (Signed)
ANTICOAGULATION CONSULT NOTE - Initial Consult  Pharmacy Consult for lovenox Indication: atrial fibrillation  Allergies  Allergen Reactions  . Chocolate     unknown  . Ciprofloxacin     unknown  . Coffee Bean Extract [Coffea Arabica]     unknown  . Gentamycin [Gentamicin]     unknown  . Imodium [Loperamide]     unknown  . Penicillins     unknown  . Sulfa Antibiotics     unknown  . Tea     unknown    Patient Measurements: Height: 5\' 3"  (160 cm) Weight: 226 lb 13.7 oz (102.9 kg) IBW/kg (Calculated) : 52.4 Heparin Dosing Weight:   Vital Signs: Temp: 98.4 F (36.9 C) (01/13 0727) Temp src: Oral (01/13 0727) BP: 120/65 mmHg (01/13 0748) Pulse Rate: 73 (01/13 0748)  Labs:  Recent Labs  06/25/13 1135 06/25/13 2115 06/26/13 0235 06/26/13 0840  HGB 13.7  --  12.3  --   HCT 41.3  --  38.8  --   PLT 357  --  308  --   LABPROT 18.6*  --   --   --   INR 1.60*  --   --   --   CREATININE 0.40*  --  0.36*  --   TROPONINI  --  <0.30 <0.30 <0.30    Estimated Creatinine Clearance: 56.8 ml/min (by C-G formula based on Cr of 0.36).   Medical History: Past Medical History  Diagnosis Date  . Arrhythmia   . Arthritis   . Atrial fibrillation     Remains stable (As of 05/09/13)  . H/O blood clots   . Hyperlipemia   . Dementia     Remains stable & continues to function adequately in the current living environment with supervision. Has had little changes in behavior (AS OF 05/09/13)  . GERD (gastroesophageal reflux disease)     Stable (As of 05/09/13)  . Osteoarthritis     Denies pain (As of 05/09/13)    Medications:  Prescriptions prior to admission  Medication Sig Dispense Refill  . acetaminophen (TYLENOL) 500 MG tablet Take 500 mg by mouth 3 (three) times daily.       Marland Kitchen albuterol (PROVENTIL) (2.5 MG/3ML) 0.083% nebulizer solution Take 2.5 mg by nebulization every 6 (six) hours as needed for wheezing or shortness of breath. For 3 days; started on 06-23-13      .  Calcium Carbonate-Vitamin D (CALTRATE 600+D PO) Take 1 tablet by mouth every morning.       . digoxin (LANOXIN) 0.125 MG tablet Take 0.125 mg by mouth every Tuesday, Thursday, Saturday, and Sunday.      . digoxin (LANOXIN) 0.25 MG tablet Take 0.25 mg by mouth every Monday, Wednesday, and Friday.       . diltiazem (DILACOR XR) 240 MG 24 hr capsule Take 240 mg by mouth daily.      . divalproex (DEPAKOTE SPRINKLE) 125 MG capsule Take 125 mg by mouth 2 (two) times daily.      . furosemide (LASIX) 40 MG tablet Take 40 mg by mouth daily.      Marland Kitchen guaiFENesin (MUCINEX) 600 MG 12 hr tablet Take 600 mg by mouth every 12 (twelve) hours. For 2 weeks; started on 06-22-13      . guaiFENesin-dextromethorphan (ROBITUSSIN DM) 100-10 MG/5ML syrup Take 10 mLs by mouth 3 (three) times daily.      . hydrOXYzine (ATARAX/VISTARIL) 25 MG tablet Take 25 mg by mouth every 8 (eight) hours as needed  for itching.      Marland Kitchen levofloxacin (LEVAQUIN) 750 MG tablet Take 750 mg by mouth daily. For 7 days; ordered on 06-23-13      . OLANZapine (ZYPREXA) 2.5 MG tablet Take 2.5 mg by mouth every evening.      Marland Kitchen omeprazole (PRILOSEC) 20 MG capsule Take 20 mg by mouth daily before breakfast.       . Polyethyl Glycol-Propyl Glycol (SYSTANE OP) Place 1 drop into both eyes 2 (two) times daily.      . potassium chloride (K-DUR) 10 MEQ tablet Take 10 mEq by mouth daily.      . Rivaroxaban (XARELTO) 20 MG TABS tablet Take 20 mg by mouth daily.      Marland Kitchen saccharomyces boulardii (FLORASTOR) 250 MG capsule Take 250 mg by mouth 2 (two) times daily. For 7 days; ordered on 06-23-13        Assessment: 83 yof on chronic xarelto for afib presented to the hospital from nursing home unresponsive. She is currently unable to take xarelto so heparin SQ was initially started for VTE prophylaxis. Last dose was administered this morning and last dose of xarelto is unknown but likely >24 hours based on time of arrival. H/H and plts are WNL and no signs of bleeding noted.  To start full dose lovenox while pt is unable to take xarelto.   Goal of Therapy:  Anti-Xa level 0.6-1 units/ml 4hrs after LMWH dose given Monitor platelets by anticoagulation protocol: Yes   Plan:  1. Lovenox 100mg  SQ Q12H 2. CBC Q72H while on lovenox 3. F/u ability to take PO and restart xarelto  Genea Rheaume, Rande Lawman 06/26/2013,11:40 AM

## 2013-06-26 NOTE — Progress Notes (Signed)
At [place] was removed and patient was placed on Venturi mask at 12L (50% O2).  Patient was more alert and able to answer orientation questions. At this time, O2 sats are maintained >95%.  Will continue to monitor.

## 2013-06-26 NOTE — Progress Notes (Signed)
Patient taken off Venturi mask at this time.  Nasal cannula placed with O2 at 5L.  Sats maintained at 96-100%.  Patient more alert and communicating coherently with staff and family present.  Patient tolerating [place] well at this time.  Will continue to monitor.

## 2013-06-26 NOTE — Progress Notes (Signed)
CRITICAL VALUE ALERT  Critical value received:  Blood culture  Date of notification:  06/26/2013  Time of notification:  1403  Critical value read back:yes  Nurse who received alert:  Emilia Beck, RN   MD notified (1st page):  06/26/2013  Time of first page:  1417  MD notified (2nd page):  Time of second page:  Responding MD:  Erin Hearing, PA  Time MD responded:  [phone]

## 2013-06-26 NOTE — Progress Notes (Signed)
Received blood culture results from [facility]. Aerobic blood cultures taken on 06/25/2013 resulted gram (+) cocci in clusters.  Erin Hearing paged with results and returned call within minutes.  No orders given at this time.

## 2013-06-26 NOTE — Progress Notes (Addendum)
Attempt to continue weaning process, Venturi mask O2 reduced to 6L (35%).  Patient maintaining sats >95-98%.  Some crackling noted during exhalation phase, but lung sounds are clear, diminished in bases, bilaterally.  Patient tolerating well.  Will continue to monitor.

## 2013-06-27 DIAGNOSIS — J9600 Acute respiratory failure, unspecified whether with hypoxia or hypercapnia: Secondary | ICD-10-CM | POA: Diagnosis not present

## 2013-06-27 DIAGNOSIS — R609 Edema, unspecified: Secondary | ICD-10-CM

## 2013-06-27 LAB — BASIC METABOLIC PANEL
BUN: 10 mg/dL (ref 6–23)
CALCIUM: 8.7 mg/dL (ref 8.4–10.5)
CO2: 43 meq/L — AB (ref 19–32)
CREATININE: 0.39 mg/dL — AB (ref 0.50–1.10)
Chloride: 78 mEq/L — ABNORMAL LOW (ref 96–112)
GFR calc Af Amer: >90 mL/min (ref >90)
GFR calc non Af Amer: >90 mL/min (ref >90)
GLUCOSE: 79 mg/dL (ref 70–99)
Potassium: 3.8 mEq/L (ref 3.7–5.3)
Sodium: 127 mEq/L — ABNORMAL LOW (ref 137–147)

## 2013-06-27 LAB — BLOOD GAS, ARTERIAL
Acid-Base Excess: 19.9 mmol/L — ABNORMAL HIGH (ref 0.0–2.0)
BICARBONATE: 46.1 meq/L — AB (ref 20.0–24.0)
Delivery systems: POSITIVE
Drawn by: 10006
EXPIRATORY PAP: 8
FIO2: 0.4 %
Inspiratory PAP: 16
Mode: POSITIVE
O2 Saturation: 99.1 %
PCO2 ART: 77.7 mmHg — AB (ref 35.0–45.0)
PO2 ART: 124 mmHg — AB (ref 80.0–100.0)
Patient temperature: 98.6
TCO2: 48.5 mmol/L (ref 0–100)
pH, Arterial: 7.391 (ref 7.350–7.450)

## 2013-06-27 LAB — CULTURE, BLOOD (ROUTINE X 2)

## 2013-06-27 LAB — CBC
HEMATOCRIT: 40 % (ref 36.0–46.0)
HEMOGLOBIN: 12.8 g/dL (ref 12.0–15.0)
MCH: 29.4 pg (ref 26.0–34.0)
MCHC: 32 g/dL (ref 30.0–36.0)
MCV: 92 fL (ref 78.0–100.0)
Platelets: 315 10*3/uL (ref 150–400)
RBC: 4.35 MIL/uL (ref 3.87–5.11)
RDW: 14.6 % (ref 11.5–15.5)
WBC: 8.4 10*3/uL (ref 4.0–10.5)

## 2013-06-27 MED ORDER — LEVALBUTEROL HCL 0.63 MG/3ML IN NEBU: 0.6300 mg | INHALATION_SOLUTION | RESPIRATORY_TRACT | Status: DC | PRN

## 2013-06-27 NOTE — Progress Notes (Signed)
*  PRELIMINARY RESULTS* Vascular Ultrasound Lower extremity venous duplex has been completed.  Preliminary findings: no evidence of DVT   Landry Mellow, RDMS, RVT  06/27/2013, 9:54 AM

## 2013-06-27 NOTE — Progress Notes (Signed)
Family at bedside, requesting to speak to MD on plan of care. Family concerned of increasing lethargy and CO2 levels. Pt Vital Signs stable. HR 81, BP 135/65, SPO2 98%. Will continue to monitor pt. No new orders received.

## 2013-06-27 NOTE — Progress Notes (Signed)
Pt. Is currently on 2L [place] & hasn't needed BIPAP during the night. Pt. Isn't in any distress & vitals have remained stable.

## 2013-06-27 NOTE — Progress Notes (Signed)
[facility] [place] TEAM 1 - Stepdown/ICU TEAM  Jennifer Nielsen [id number] DOB: 06/16/1924 DOA: 06/25/2013 PCP: [name], MD  Brief narrative: 80 year old female patient who resides at [facility]. Sent to the ER because of unresponsive state. According to the family patient had shortness of breath for the past 7 days. The facility placed on oxygen with improvement in symptoms until the day before presentation when patient became sleepy and would not wake up. According to the admission note the facility was not sure how long the patient was actually unresponsive. Apparently an influenza screen was checked at facility which was negative. She has had several weeks of progressive lower extremity edema. She did follow up with Dr. Meda Coffee with Cardiology and was recently switched to Xarelto from Coumadin and ECHO planned as OP. Recently started on Levaquin for presumed pneumonia.  Despite being normotensive patient was mildly tachycardic and hypoxic requiring 6 L of oxygen, because of her altered level of consciousness the sepsis protocol was initiated. ABG demonstrated respiratory acidosis with a pH of 7.22 and a PCO2 in the 70s. She was subsequently placed on BiPAP. Telemetry revealed atrial fibrillation with RVR. She also had extensive 3-4+ bilateral lower extremity edema.  Assessment/Plan:  Acute respiratory failure with hypercapnia:   A) Diastolic CHF + Pulm HTN   B) PNA -IVFs initiated in ER - dc'd -has weaned to 1 L oxygen -cont Lasix to 40 mg IV BID -ECHO this admit c/w preserved LV systolic function with mild LVH-EF 50-60%, no regional wall motion abnormalities, moderate dilatation left atrium, mild dilatation right ventricle, mild dilatation right atrium, mild to moderate pulmonary hypertension with a peak PA pressure 41 mmHg -No focal infiltrate CXR-doubt PNA although patient does have productive cough at facility so as precaution continue antibiotic  Abnormal blood cx's -1/2  bottles positive for coag negative staph- likely contaminant but continue Vanco until other cx has a couple more days to mature  -urine cx negative  Metabolic encephalopathy -Remains lethargic despite improvement in PCO2-did open eyes and weak attempt to verbalize today -Follow  Atrial fibrillation -Rate controlled -too lethargic for all medications so IV Lopressor scheduled -given advanced age and preserved LV systolic fxn dc digoxin  Hyponatremia/Hypochloremia -Consistent with volume overload -Follow lab  Long term use of anticoagulants  -Xarelto preadmission -Low likelihood of DVT (see below)  Senile dementia, uncomplicated  Bilateral lower extremity edema -Diurese as above -Echo does not explain the degree of edema therefore we checked LE venous duplex studies: preliminary no DVT -albumin 2.4 so suspect dysmobility and low albumin as etiology, as well as pulm HTN and diastolic CHF, w/ probable venous insuf as well   DVT prophylaxis: Too lethargic for Xarelto- cont full dose Lovenox Code Status:  DO NOT RESUSCITATE Family Communication: With daughter at bedside Disposition Plan/Expected LOS: Stepdown due to persistent lethargy  Consultants: None  Procedures: 2-D echocardiogram  - Left ventricle: The cavity size was normal. Wall thickness was increased in a pattern of mild LVH. Systolic function was normal. The estimated ejection fraction was in the range of 55% to 60%. Wall motion was normal; there were no regional wall motion abnormalities. - Aortic valve: Trivial regurgitation. - Left atrium: The atrium was mildly to moderately dilated. - Right ventricle: The cavity size was mildly dilated. - Right atrium: The atrium was mildly dilated. - Pulmonary arteries: PA peak pressure: 54mm Hg (S).  Antibiotics: Maxipime 1/12 >>> Vancomycin 1/12 >>>  HPI/Subjective: Remains lethargic although slightly more awake today. No complaints verbalized  Objective: Blood  pressure 130/88, pulse 32, temperature 98.4 F (36.9 C), temperature source Oral, resp. rate 30, height 5\' 3"  (1.6 m), weight 215 lb 9.8 oz (97.8 kg), SpO2 96.00%.  Intake/Output Summary (Last 24 hours) at 06/27/13 1156 Last data filed at 06/27/13 0900  Gross per 24 hour  Intake     53 ml  Output   3251 ml  Net  -3198 ml   Exam: General: No acute resp distress at time of exam  Lungs: Coarse to auscultation bilaterally with crackles, 1L Cardiovascular: Irregular rate and rhythm without murmur gallop or rub normal S1 and S2, 3-4+ bilateral peripheral edema-thick neck so difficult to determine if true JVD Abdomen: Nontender, nondistended, soft, bowel sounds positive, no rebound, no ascites, no appreciable mass Musculoskeletal: No significant cyanosis, clubbing of bilateral lower extremities Neurological: Lethargic but now opens eyes to voive and attempts to vocalize, moves all extremities x 4 spontaneously  Scheduled Meds:  Scheduled Meds: . ceFEPime (MAXIPIME) IV  1 g Intravenous Q12H  . enoxaparin (LOVENOX) injection  100 mg Subcutaneous Q12H  . furosemide  40 mg Intravenous Q12H  . metoprolol  5 mg Intravenous Q6H  . sodium chloride  3 mL Intravenous Q12H  . vancomycin  1,500 mg Intravenous Q24H   Data Reviewed: Basic Metabolic Panel:  Recent Labs Lab 06/25/13 1135 06/26/13 0235 06/27/13 0333  NA 123* 124* 127*  K 5.0 5.2 3.8  CL 78* 78* 78*  CO2 41* 40* 43*  GLUCOSE 101* 87 79  BUN 11 8 10   CREATININE 0.40* 0.36* 0.39*  CALCIUM 8.9 8.4 8.7   Liver Function Tests:  Recent Labs Lab 06/25/13 1135 06/26/13 0235  AST 22 38*  ALT 19 16  ALKPHOS 87 62  BILITOT 0.5 0.5  PROT 7.3 6.1  ALBUMIN 3.0* 2.4*   CBC:  Recent Labs Lab 06/25/13 1135 06/26/13 0235 06/27/13 0333  WBC 8.0 6.7 8.4  NEUTROABS 5.9  --   --   HGB 13.7 12.3 12.8  HCT 41.3 38.8 40.0  MCV 90.2 93.0 92.0  PLT 357 308 315   Cardiac Enzymes:  Recent Labs Lab 06/25/13 2115 06/26/13 0235  06/26/13 0840  TROPONINI <0.30 <0.30 <0.30   BNP (last 3 results)  Recent Labs  06/25/13 1337  PROBNP 3381.0*   CBG: No results found for this basename: GLUCAP,  in the last 168 hours  Recent Results (from the past 240 hour(s))  URINE CULTURE     Status: None   Collection Time    06/25/13 11:24 AM      Result Value Range Status   Specimen Description URINE, RANDOM   Final   Special Requests NONE   Final   Culture  Setup Time     Final   Value: 06/25/2013 16:17     Performed at [place]     Final   Value: NO GROWTH     Performed at [facility]   Culture     Final   Value: NO GROWTH     Performed at [facility]   Report Status 06/26/2013 FINAL   Final  CULTURE, BLOOD (ROUTINE X 2)     Status: None   Collection Time    06/25/13 11:40 AM      Result Value Range Status   Specimen Description BLOOD RIGHT ANTECUBITAL   Final   Special Requests BOTTLES DRAWN AEROBIC ONLY 5MLS   Final   Culture  Setup Time  Final   Value: 06/25/2013 16:02     Performed at [facility]   Culture     Final   Value: STAPHYLOCOCCUS SPECIES (COAGULASE NEGATIVE)     Note: THE SIGNIFICANCE OF ISOLATING THIS ORGANISM FROM A SINGLE SET OF BLOOD CULTURES WHEN MULTIPLE SETS ARE DRAWN IS UNCERTAIN. PLEASE NOTIFY THE MICROBIOLOGY DEPARTMENT WITHIN ONE WEEK IF SPECIATION AND SENSITIVITIES ARE REQUIRED.     Note: Gram Stain Report Called to,Read Back By and Verified With: STEPHANIE SHAW 06/26/13 1345 BY SMITHERSJ     Performed at [facility]   Report Status 06/27/2013 FINAL   Final  CULTURE, BLOOD (ROUTINE X 2)     Status: None   Collection Time    06/25/13 11:45 AM      Result Value Range Status   Specimen Description BLOOD RIGHT HAND   Final   Special Requests BOTTLES DRAWN AEROBIC ONLY 5MLS   Final   Culture  Setup Time     Final   Value: 06/25/2013 16:03     Performed at [facility]   Culture     Final   Value:        BLOOD  CULTURE RECEIVED NO GROWTH TO DATE CULTURE WILL BE HELD FOR 5 DAYS BEFORE ISSUING A FINAL NEGATIVE REPORT     Performed at [facility]   Report Status PENDING   Incomplete  MRSA PCR SCREENING     Status: None   Collection Time    06/25/13  8:20 PM      Result Value Range Status   MRSA by PCR NEGATIVE  NEGATIVE Final   Comment:            The GeneXpert MRSA Assay (FDA     approved for NASAL specimens     only), is one component of a     comprehensive MRSA colonization     surveillance program. It is not     intended to diagnose MRSA     infection nor to guide or     monitor treatment for     MRSA infections.     Studies:  Recent x-ray studies have been reviewed in detail by the Attending Physician  Time spent : 35mins   Allison Ellis, ANP [facility] Office  [phone] Pager [phone]  **If unable to reach the above provider after paging please contact the [place] @ [phone]  On-Call/Text Page:      [email or web address]      password [email or web address]  If 7PM-7AM, please contact night-coverage [email or web address] Password [email or web address] 06/27/2013, 11:56 AM   LOS: 2 days   I have personally examined this patient and reviewed the entire database. I have reviewed the above note, made any necessary editorial changes, and agree with its content.  Cherene Altes, MD [facility]

## 2013-06-27 NOTE — Progress Notes (Signed)
Clinical Social Work Department BRIEF PSYCHOSOCIAL ASSESSMENT 06/27/2013  Patient:  Jennifer Nielsen, Jennifer Nielsen     Account Number:  [id number]     Admit date:  06/25/2013  Clinical Social Worker:  Freeman Caldron  Date/Time:  06/27/2013 04:05 PM  Referred by:  Physician  Date Referred:  06/27/2013 Referred for  SNF Placement   Other Referral:   Interview type:  Family Other interview type:    PSYCHOSOCIAL DATA Living Status:  FACILITY Admitted from facility:  [place] Level of care:  [place] Primary support name:  Jazmaine Fuelling [phone]) Primary support relationship to patient:  CHILD, ADULT Degree of support available:   Good--pt's daughter states pt has lived a [employer] for a couple of years.    CURRENT CONCERNS Current Concerns  Post-Acute Placement   Other Concerns:    SOCIAL WORK ASSESSMENT / PLAN CSW called pt's daughter [name] to complete assessment, as pt is disoriented x3 according to the chart. CSW explained that in pt's chart it states pt is from [facility], and that [place] facilitates discharge back to this facility. Pt's daughter states they would like her mother to return, and that she has been a resident at [facility] for a few years. CSW asked [name] about her feelings concerning the facility, and [name] states they have been happy with the SNF and they do want pt to return. CSW explained CSW role in discharge and that CSW will prepare everything for pt to be able to return. Pt's daughter thanked CSW.   Assessment/plan status:  Psychosocial Support/Ongoing Assessment of Needs Other assessment/ plan:   Information/referral to community resources:   SNF ([place]).    PATIENT'S/FAMILY'S RESPONSE TO PLAN OF CARE: Good--pt's daughter confirms they want pt to return to [facility]. Daughter understanding of CSW role and thanked CSW for assistance.       Ky Barban, MSW, [facility] Clinical Social Worker [phone]

## 2013-06-28 DIAGNOSIS — I4891 Unspecified atrial fibrillation: Secondary | ICD-10-CM | POA: Diagnosis not present

## 2013-06-28 DIAGNOSIS — J9600 Acute respiratory failure, unspecified whether with hypoxia or hypercapnia: Secondary | ICD-10-CM | POA: Diagnosis not present

## 2013-06-28 LAB — CBC
HEMATOCRIT: 47.1 % — AB (ref 36.0–46.0)
HEMOGLOBIN: 15.5 g/dL — AB (ref 12.0–15.0)
MCH: 30.6 pg (ref 26.0–34.0)
MCHC: 32.9 g/dL (ref 30.0–36.0)
MCV: 93.1 fL (ref 78.0–100.0)
Platelets: 218 10*3/uL (ref 150–400)
RBC: 5.06 MIL/uL (ref 3.87–5.11)
RDW: 14.7 % (ref 11.5–15.5)
WBC: 6.9 10*3/uL (ref 4.0–10.5)

## 2013-06-28 LAB — BASIC METABOLIC PANEL
BUN: 11 mg/dL (ref 6–23)
CHLORIDE: 80 meq/L — AB (ref 96–112)
CO2: 45 mEq/L (ref 19–32)
Calcium: 8.6 mg/dL (ref 8.4–10.5)
Creatinine, Ser: 0.56 mg/dL (ref 0.50–1.10)
GFR calc Af Amer: >90 mL/min (ref >90)
GFR, EST NON AFRICAN AMERICAN: 81 mL/min — AB (ref >90)
Glucose, Bld: 68 mg/dL — ABNORMAL LOW (ref 70–99)
POTASSIUM: 5.1 meq/L (ref 3.7–5.3)
Sodium: 134 mEq/L — ABNORMAL LOW (ref 137–147)

## 2013-06-28 MED ORDER — FUROSEMIDE 10 MG/ML IJ SOLN
40.0000 mg | Freq: Every day | INTRAMUSCULAR | Status: DC
  Administered 2013-06-28 – 2013-06-29 (×2): 40 mg via INTRAVENOUS
  Filled 2013-06-28: qty 4

## 2013-06-28 NOTE — Plan of Care (Cosign Needed)
Renal fnx stable but Hgb up to 15 concerning for hemoconcentrtion so will dc Lasix for now until can be evaluated on rounds.  Erin Hearing, ANP

## 2013-06-28 NOTE — Progress Notes (Signed)
Patient taken off BIPAP at this time and placed on 2 LNC. Patient appears to be more comfortable. Able to vocalize and tell me she fells ok. RT will continue to monitor.

## 2013-06-28 NOTE — Progress Notes (Addendum)
[facility] [place] TEAM 1 - Stepdown/ICU TEAM  Jennifer Nielsen [id number] DOB: [birth year] DOA: 06/25/2013 PCP: [name], MD  Brief narrative: 80 year old female patient who resides at [facility]. Sent to the ER because of unresponsive state. According to the family patient had shortness of breath for the past 7 days. The facility placed on oxygen with improvement in symptoms until the day before presentation when patient became sleepy and would not wake up. According to the admission note the facility was not sure how long the patient was actually unresponsive. Apparently an influenza screen was checked at facility which was negative. She has had several weeks of progressive lower extremity edema. She did follow up with Dr. Meda Coffee with Cardiology and was recently switched to Xarelto from Coumadin and ECHO planned as OP. Recently started on Levaquin for presumed pneumonia.  Despite being normotensive patient was mildly tachycardic and hypoxic requiring 6 L of oxygen, because of her altered level of consciousness the sepsis protocol was initiated. ABG demonstrated respiratory acidosis with a pH of 7.22 and a PCO2 in the 70s. She was subsequently placed on BiPAP. Telemetry revealed atrial fibrillation with RVR. She also had extensive 3-4+ bilateral lower extremity edema.  Assessment/Plan:  Acute respiratory failure with hypercapnia:   A) Diastolic CHF + Pulm HTN   B) PNA -IVFs initiated in ER - dc'd -had weaned to 1 L oxygen but incr lethargy 1/14- ABG recurrent hypercarbia so resumed BiPAP-now back on 2 L- pt awake and communicative- says is hungry -decr. Lasix to 40 mg IV QD -ECHO this admit c/w preserved LV systolic function with mild LVH-EF 50-60%, no regional wall motion abnormalities, moderate dilatation left atrium, mild dilatation right ventricle, mild dilatation right atrium, mild to moderate pulmonary hypertension with a peak PA pressure 41 mmHg -No focal infiltrate CXR-doubt  PNA although patient does have productive cough at facility so as precaution continue meropenem- d/c vancomycin  Abnormal blood cx's -1/2 bottles positive for coag negative staph- - d/c Vanc -urine cx negative  Metabolic encephalopathy -Resolving- would appreciate SLP eval for diet appropriateness- known history esophageal stenosis but this not reason for consult -Follow  Atrial fibrillation -Rate controlled -was too lethargic for all medications so IV Lopressor scheduled-if remains alert for 24 hrs plan transition to PO meds -given advanced age and preserved LV systolic fxn dc digoxin  Hyponatremia/Hypochloremia -Consistent with volume overload -Follow lab  Long term use of anticoagulants  -Xarelto preadmission -Low likelihood of DVT (see below)  Senile dementia, uncomplicated  Bilateral lower extremity edema -Diurese as above -Echo does not explain the degree of edema therefore we checked LE venous duplex studies: no DVT -albumin 2.4 so suspect dysmobility and low albumin as etiology, as well as pulm HTN and diastolic CHF, w/ probable venous insuf as well   DVT prophylaxis: Too lethargic for Xarelto- cont full dose Lovenox Code Status:  DO NOT RESUSCITATE Family Communication: With daughter at bedside Disposition Plan/Expected LOS: Stepdown due to recent hypercarbic related lethargy and resting tachycardia  Consultants: None  Procedures: 2-D echocardiogram  - Left ventricle: The cavity size was normal. Wall thickness was increased in a pattern of mild LVH. Systolic function was normal. The estimated ejection fraction was in the range of 55% to 60%. Wall motion was normal; there were no regional wall motion abnormalities. - Aortic valve: Trivial regurgitation. - Left atrium: The atrium was mildly to moderately dilated. - Right ventricle: The cavity size was mildly dilated. - Right atrium: The atrium was mildly dilated. -  Pulmonary arteries: PA peak pressure: 31mm  Hg (S).  Lower extremity venous duplex -negative  Antibiotics: Maxipime 1/12 >>> Vancomycin 1/12 >>>  HPI/Subjective: Significantly more alert today and able to verbally communicate although still confused and having some difficulty with short-term memory. Remains very weak.  Objective: Blood pressure 127/60, pulse 81, temperature 98.6 F (37 C), temperature source Axillary, resp. rate 19, height 5\' 3"  (1.6 m), weight 215 lb 13.3 oz (97.9 kg), SpO2 99.00%.  Intake/Output Summary (Last 24 hours) at 06/28/13 1033 Last data filed at 06/28/13 0900  Gross per 24 hour  Intake     53 ml  Output   2650 ml  Net  -2597 ml   Exam: General: No acute resp distress at time of exam  Lungs: Coarse to auscultation bilaterally with crackles, 2 L Cardiovascular: Irregular rate and rhythm without murmur gallop or rub normal S1 and S2, now with trace to 1+ bilateral peripheral edema-thick neck so difficult to determine if true JVD Abdomen: Nontender, nondistended, soft, bowel sounds positive, no rebound, no ascites, no appreciable mass Musculoskeletal: No significant cyanosis, clubbing of bilateral lower extremities Neurological: Awake and oriented to name and place, exquisitely weak movement of extremities but this is near her baseline since was essentially bedbound at facility and was a total lift to get out of bed to chair or to wheelchair and had trunk instability at baseline according to her family  Scheduled Meds:  Scheduled Meds: . ceFEPime (MAXIPIME) IV  1 g Intravenous Q12H  . enoxaparin (LOVENOX) injection  100 mg Subcutaneous Q12H  . furosemide  40 mg Intravenous Daily  . metoprolol  5 mg Intravenous Q6H  . sodium chloride  3 mL Intravenous Q12H  . vancomycin  1,500 mg Intravenous Q24H   Data Reviewed: Basic Metabolic Panel:  Recent Labs Lab 06/25/13 1135 06/26/13 0235 06/27/13 0333 06/28/13 0338  NA 123* 124* 127* 134*  K 5.0 5.2 3.8 5.1  CL 78* 78* 78* 80*  CO2 41* 40* 43*  45*  GLUCOSE 101* 87 79 68*  BUN 11 8 10 11   CREATININE 0.40* 0.36* 0.39* 0.56  CALCIUM 8.9 8.4 8.7 8.6   Liver Function Tests:  Recent Labs Lab 06/25/13 1135 06/26/13 0235  AST 22 38*  ALT 19 16  ALKPHOS 87 62  BILITOT 0.5 0.5  PROT 7.3 6.1  ALBUMIN 3.0* 2.4*   CBC:  Recent Labs Lab 06/25/13 1135 06/26/13 0235 06/27/13 0333 06/28/13 0338  WBC 8.0 6.7 8.4 6.9  NEUTROABS 5.9  --   --   --   HGB 13.7 12.3 12.8 15.5*  HCT 41.3 38.8 40.0 47.1*  MCV 90.2 93.0 92.0 93.1  PLT 357 308 315 218   Cardiac Enzymes:  Recent Labs Lab 06/25/13 2115 06/26/13 0235 06/26/13 0840  TROPONINI <0.30 <0.30 <0.30   BNP (last 3 results)  Recent Labs  06/25/13 1337  PROBNP 3381.0*   CBG: No results found for this basename: GLUCAP,  in the last 168 hours  Recent Results (from the past 240 hour(s))  URINE CULTURE     Status: None   Collection Time    06/25/13 11:24 AM      Result Value Range Status   Specimen Description URINE, RANDOM   Final   Special Requests NONE   Final   Culture  Setup Time     Final   Value: 06/25/2013 16:17     Performed at [place]     Final  Value: NO GROWTH     Performed at [facility]     Final   Value: NO GROWTH     Performed at [facility]   Report Status 06/26/2013 FINAL   Final  CULTURE, BLOOD (ROUTINE X 2)     Status: None   Collection Time    06/25/13 11:40 AM      Result Value Range Status   Specimen Description BLOOD RIGHT ANTECUBITAL   Final   Special Requests BOTTLES DRAWN AEROBIC ONLY 5MLS   Final   Culture  Setup Time     Final   Value: 06/25/2013 16:02     Performed at [facility]   Culture     Final   Value: STAPHYLOCOCCUS SPECIES (COAGULASE NEGATIVE)     Note: THE SIGNIFICANCE OF ISOLATING THIS ORGANISM FROM A SINGLE SET OF BLOOD CULTURES WHEN MULTIPLE SETS ARE DRAWN IS UNCERTAIN. PLEASE NOTIFY THE MICROBIOLOGY DEPARTMENT WITHIN ONE WEEK IF SPECIATION AND  SENSITIVITIES ARE REQUIRED.     Note: Gram Stain Report Called to,Read Back By and Verified With: STEPHANIE SHAW 06/26/13 1345 BY SMITHERSJ     Performed at [facility]   Report Status 06/27/2013 FINAL   Final  CULTURE, BLOOD (ROUTINE X 2)     Status: None   Collection Time    06/25/13 11:45 AM      Result Value Range Status   Specimen Description BLOOD RIGHT HAND   Final   Special Requests BOTTLES DRAWN AEROBIC ONLY 5MLS   Final   Culture  Setup Time     Final   Value: 06/25/2013 16:03     Performed at [facility]   Culture     Final   Value:        BLOOD CULTURE RECEIVED NO GROWTH TO DATE CULTURE WILL BE HELD FOR 5 DAYS BEFORE ISSUING A FINAL NEGATIVE REPORT     Performed at [facility]   Report Status PENDING   Incomplete  MRSA PCR SCREENING     Status: None   Collection Time    06/25/13  8:20 PM      Result Value Range Status   MRSA by PCR NEGATIVE  NEGATIVE Final   Comment:            The GeneXpert MRSA Assay (FDA     approved for NASAL specimens     only), is one component of a     comprehensive MRSA colonization     surveillance program. It is not     intended to diagnose MRSA     infection nor to guide or     monitor treatment for     MRSA infections.     Studies:  Recent x-ray studies have been reviewed in detail by the Attending Physician  Time spent : [place], ANP [facility] Office  [phone] Pager [phone]  **If unable to reach the above provider after paging please contact the [place] @ [phone]  On-Call/Text Page:      [email or web address]      password [email or web address]  If 7PM-7AM, please contact night-coverage [email or web address] Password [facility] 06/28/2013, 10:33 AM   LOS: 3 days   I have examined the patient, reviewed the chart and modified the above note which I agree with.   [id number] [phone] 06/28/2013, 5:10 PM

## 2013-06-28 NOTE — Progress Notes (Signed)
ANTIBIOTIC CONSULT NOTE - Follow-up  Pharmacy Consult for vancomycin and cefepime Indication: rule out sepsis  Allergies  Allergen Reactions  . Chocolate     unknown  . Ciprofloxacin     unknown  . Coffee Bean Extract [Coffea Arabica]     unknown  . Gentamycin [Gentamicin]     unknown  . Imodium [Loperamide]     unknown  . Penicillins     unknown  . Sulfa Antibiotics     unknown  . Tea     unknown    Patient Measurements: Height: 5\' 3"  (160 cm) Weight: 215 lb 13.3 oz (97.9 kg) IBW/kg (Calculated) : 52.4  Body Weight: 102.1 kg  Vital Signs: Temp: 98.6 F (37 C) (01/15 0420) Temp src: Axillary (01/15 0420) BP: 127/60 mmHg (01/15 0400) Pulse Rate: 81 (01/15 0731) Intake/Output from previous day: 01/14 0701 - 01/15 0700 In: 53 [I.V.:3; IV Piggyback:50] Out: 2501 [Urine:2500; Stool:1] Intake/Output from this shift:    Labs:  Recent Labs  06/26/13 0235 06/27/13 0333 06/28/13 0338  WBC 6.7 8.4 6.9  HGB 12.3 12.8 15.5*  PLT 308 315 218  CREATININE 0.36* 0.39* 0.56   Estimated Creatinine Clearance: 55.2 ml/min (by C-G formula based on Cr of 0.56). No results found for this basename: VANCOTROUGH, Corlis Leak, VANCORANDOM, [place], GENTPEAK, [place], [place], TOBRAPEAK, TOBRARND, AMIKACINPEAK, AMIKACINTROU, AMIKACIN,  in the last 72 hours   Microbiology: Recent Results (from the past 720 hour(s))  URINE CULTURE     Status: None   Collection Time    06/25/13 11:24 AM      Result Value Range Status   Specimen Description URINE, RANDOM   Final   Special Requests NONE   Final   Culture  Setup Time     Final   Value: 06/25/2013 16:17     Performed at [place]     Final   Value: NO GROWTH     Performed at [facility]   Culture     Final   Value: NO GROWTH     Performed at [facility]   Report Status 06/26/2013 FINAL   Final  CULTURE, BLOOD (ROUTINE X 2)     Status: None   Collection Time    06/25/13  11:40 AM      Result Value Range Status   Specimen Description BLOOD RIGHT ANTECUBITAL   Final   Special Requests BOTTLES DRAWN AEROBIC ONLY 5MLS   Final   Culture  Setup Time     Final   Value: 06/25/2013 16:02     Performed at [facility]   Culture     Final   Value: STAPHYLOCOCCUS SPECIES (COAGULASE NEGATIVE)     Note: THE SIGNIFICANCE OF ISOLATING THIS ORGANISM FROM A SINGLE SET OF BLOOD CULTURES WHEN MULTIPLE SETS ARE DRAWN IS UNCERTAIN. PLEASE NOTIFY THE MICROBIOLOGY DEPARTMENT WITHIN ONE WEEK IF SPECIATION AND SENSITIVITIES ARE REQUIRED.     Note: Gram Stain Report Called to,Read Back By and Verified With: STEPHANIE SHAW 06/26/13 1345 BY SMITHERSJ     Performed at [facility]   Report Status 06/27/2013 FINAL   Final  CULTURE, BLOOD (ROUTINE X 2)     Status: None   Collection Time    06/25/13 11:45 AM      Result Value Range Status   Specimen Description BLOOD RIGHT HAND   Final   Special Requests BOTTLES DRAWN AEROBIC ONLY 5MLS   Final   Culture  Setup Time     Final   Value: 06/25/2013 16:03     Performed at [facility]   Culture     Final   Value:        BLOOD CULTURE RECEIVED NO GROWTH TO DATE CULTURE WILL BE HELD FOR 5 DAYS BEFORE ISSUING A FINAL NEGATIVE REPORT     Performed at [facility]   Report Status PENDING   Incomplete  MRSA PCR SCREENING     Status: None   Collection Time    06/25/13  8:20 PM      Result Value Range Status   MRSA by PCR NEGATIVE  NEGATIVE Final   Comment:            The GeneXpert MRSA Assay (FDA     approved for NASAL specimens     only), is one component of a     comprehensive MRSA colonization     surveillance program. It is not     intended to diagnose MRSA     infection nor to guide or     monitor treatment for     MRSA infections.   Assessment: 70 yof presented to the hospital from nursing home d/t unresponsive state. She was empirically started on cefepime + vancomycin. Today is D#4 of therapy. Pt  is afebrile and WBC is WNL. Doses are appropriate. She is growing coag negative staph in 1/2 blood cultures but this is likely an contaminant. All other cultures have been negative.   Vanc 1/12>> Cefepime 1/12>>  1/12 Blood - CNS 1/2 1/12 MRSA - NEG 1/12 Urine - NEG 1/12 Flu - NEG  Goal of Therapy:  Vancomycin trough level 15-20 mcg/ml Eradication of infection  Plan:  1. Continue vancomycin 1500mg  IV Q24H 2. Continue cefepime 1gm IV Q12H 3. F/u renal fxn, C&S, clinical status and trough at SS 4. MD - Consider DC or de-escalate antibiotics   Salome Arnt, PharmD, BCPS Pager # [phone] 06/28/2013 9:13 AM

## 2013-06-28 NOTE — Evaluation (Signed)
Clinical/Bedside Swallow Evaluation Patient Details  Name: Jennifer Nielsen MRN: [id number] Date of Birth: 1924-09-08  Today's Date: 06/28/2013 Time: 1040-1106 SLP Time Calculation (min): 26 min  Past Medical History:  Past Medical History  Diagnosis Date  . Arrhythmia   . Arthritis   . Atrial fibrillation     Remains stable (As of 05/09/13)  . H/O blood clots   . Hyperlipemia   . Dementia     Remains stable & continues to function adequately in the current living environment with supervision. Has had little changes in behavior (AS OF 05/09/13)  . GERD (gastroesophageal reflux disease)     Stable (As of 05/09/13)  . Osteoarthritis     Denies pain (As of 05/09/13)   Past Surgical History:  Past Surgical History  Procedure Laterality Date  . Appendectomy    . Gallbladder surgery    . Hip surgery      Right-Metal plate   . Leg surgery      Left leg   HPI:  80 year old female patient who resides at [facility]. Sent to the ER because of unresponsive state. According to the family patient had shortness of breath for the past 7 days. The facility placed on oxygen with improvement in symptoms until the day before presentation when patient became sleepy and would not wake up. According to the admission note the facility was not sure how long the patient was actually unresponsive. Apparently an influenza screen was checked at facility which was negative. She has had several weeks of progressive lower extremity edema. She did follow up with Dr. Meda Coffee with Cardiology and was recently switched to Xarelto from Coumadin and ECHO planned as OP. Recently started on Levaquin for presumed pneumonia. Despite being normotensive patient was mildly tachycardic and hypoxic requiring 6 L of oxygen, because of her altered level of consciousness the sepsis protocol was initiated. ABG demonstrated respiratory acidosis with a pH of 7.22 and a PCO2 in the 70s. She was subsequently placed on BiPAP. Telemetry  revealed atrial fibrillation with RVR. She also had extensive 3-4+ bilateral lower extremity edema.   Assessment / Plan / Recommendation Clinical Impression  Pt demonstrates no overt signs of aspiration, she is alert and functional with PO. Her dentures are ill fitting and bottom denture floats around in her mouth without her awareness, mildly impairing mastication. recommend dys 3 (mechanical soft) with thin liquids and full supervision. Educated pt and family on aspiration precautions and need for oral care. No SLP f/u needed. Downgrade solids to dys 2 if pt struggling.     Aspiration Risk  Mild    Diet Recommendation Dysphagia 3 (Mechanical Soft);Thin liquid   Liquid Administration via: Cup;Straw Medication Administration: Whole meds with liquid Supervision: Patient able to self feed Compensations: Slow rate;Small sips/bites Postural Changes and/or Swallow Maneuvers: Seated upright 90 degrees;Upright 30-60 min after meal    Other  Recommendations Oral Care Recommendations: Oral care Q4 per protocol   Follow Up Recommendations  None    Frequency and Duration        Pertinent Vitals/Pain NA    SLP Swallow Goals     Swallow Study Prior Functional Status       General HPI: 80 year old female patient who resides at [facility]. Sent to the ER because of unresponsive state. According to the family patient had shortness of breath for the past 7 days. The facility placed on oxygen with improvement in symptoms until the day before presentation when patient became sleepy and would  not wake up. According to the admission note the facility was not sure how long the patient was actually unresponsive. Apparently an influenza screen was checked at facility which was negative. She has had several weeks of progressive lower extremity edema. She did follow up with Dr. Meda Coffee with Cardiology and was recently switched to Xarelto from Coumadin and ECHO planned as OP. Recently started on Levaquin  for presumed pneumonia. Despite being normotensive patient was mildly tachycardic and hypoxic requiring 6 L of oxygen, because of her altered level of consciousness the sepsis protocol was initiated. ABG demonstrated respiratory acidosis with a pH of 7.22 and a PCO2 in the 70s. She was subsequently placed on BiPAP. Telemetry revealed atrial fibrillation with RVR. She also had extensive 3-4+ bilateral lower extremity edema. Type of Study: Bedside swallow evaluation Previous Swallow Assessment: none Diet Prior to this Study: NPO Temperature Spikes Noted: No Respiratory Status: Nasal cannula History of Recent Intubation: No Behavior/Cognition: Alert;Cooperative;Pleasant mood;Confused Oral Cavity - Dentition: Dentures, top;Dentures, bottom (loose fitting) Self-Feeding Abilities: Able to feed self Patient Positioning: Upright in bed Baseline Vocal Quality: Clear Volitional Cough: Strong;Congested Volitional Swallow: Able to elicit    Oral/Motor/Sensory Function Overall Oral Motor/Sensory Function: Appears within functional limits for tasks assessed   Ice Chips     Thin Liquid Thin Liquid: Within functional limits Presentation: Cup;Straw;Self Fed    Nectar Thick Nectar Thick Liquid: Not tested   Honey Thick Honey Thick Liquid: Not tested   Puree Puree: Within functional limits   Solid   GO    Solid: Impaired Presentation: Self Fed Oral Phase Functional Implications: Other (comment) (dentures came loose)      Herbie Baltimore, MA CCC-SLP [phone]  Farzad Tibbetts, Katherene Ponto 06/28/2013,11:49 AM

## 2013-06-29 DIAGNOSIS — J9600 Acute respiratory failure, unspecified whether with hypoxia or hypercapnia: Secondary | ICD-10-CM | POA: Diagnosis not present

## 2013-06-29 DIAGNOSIS — I5031 Acute diastolic (congestive) heart failure: Secondary | ICD-10-CM | POA: Diagnosis not present

## 2013-06-29 DIAGNOSIS — J189 Pneumonia, unspecified organism: Secondary | ICD-10-CM | POA: Diagnosis not present

## 2013-06-29 DIAGNOSIS — I4891 Unspecified atrial fibrillation: Secondary | ICD-10-CM | POA: Diagnosis not present

## 2013-06-29 DIAGNOSIS — G9341 Metabolic encephalopathy: Secondary | ICD-10-CM | POA: Diagnosis not present

## 2013-06-29 LAB — BASIC METABOLIC PANEL
BUN: 11 mg/dL (ref 6–23)
CHLORIDE: 84 meq/L — AB (ref 96–112)
CO2: 44 mEq/L (ref 19–32)
Calcium: 8.8 mg/dL (ref 8.4–10.5)
Creatinine, Ser: 0.44 mg/dL — ABNORMAL LOW (ref 0.50–1.10)
GFR calc non Af Amer: 88 mL/min — ABNORMAL LOW (ref >90)
GLUCOSE: 85 mg/dL (ref 70–99)
POTASSIUM: 3.6 meq/L — AB (ref 3.7–5.3)
Sodium: 138 mEq/L (ref 137–147)

## 2013-06-29 MED ORDER — DILTIAZEM HCL 30 MG PO TABS
30.0000 mg | ORAL_TABLET | Freq: Four times a day (QID) | ORAL | Status: DC
  Administered 2013-06-29 – 2013-06-30 (×3): 30 mg via ORAL
  Filled 2013-06-29 (×7): qty 1

## 2013-06-29 MED ORDER — METOPROLOL TARTRATE 1 MG/ML IV SOLN: 5.0000 mg | Freq: Four times a day (QID) | INTRAVENOUS | Status: DC | PRN

## 2013-06-29 MED ORDER — DILTIAZEM HCL 25 MG/5ML IV SOLN
20.0000 mg | Freq: Once | INTRAVENOUS | Status: DC
  Filled 2013-06-29 (×3): qty 5

## 2013-06-29 MED ORDER — RIVAROXABAN 20 MG PO TABS
20.0000 mg | ORAL_TABLET | Freq: Every day | ORAL | Status: DC
  Administered 2013-06-29 – 2013-07-01 (×3): 20 mg via ORAL
  Filled 2013-06-29 (×4): qty 1

## 2013-06-29 MED ORDER — POTASSIUM CHLORIDE 20 MEQ/15ML (10%) PO LIQD
40.0000 meq | Freq: Once | ORAL | Status: AC
  Administered 2013-06-29: 40 meq via ORAL
  Filled 2013-06-29: qty 30

## 2013-06-29 MED ORDER — DILTIAZEM HCL 30 MG PO TABS
30.0000 mg | ORAL_TABLET | Freq: Three times a day (TID) | ORAL | Status: DC
  Administered 2013-06-29: 30 mg via ORAL
  Filled 2013-06-29 (×3): qty 1

## 2013-06-29 MED ORDER — SODIUM CHLORIDE 0.9 % IV BOLUS (SEPSIS): 250.0000 mL | Freq: Once | INTRAVENOUS | Status: DC

## 2013-06-29 NOTE — Progress Notes (Signed)
CRITICAL VALUE ALERT  Critical value received:  CO2 44   Date of notification:  06/29/2013  Time of notification:  0945  Critical value read back:yes  Nurse who received alert:  Emilia Beck, RN  MD notified (1st page):  Erin Hearing, PA  Time of first page:  27  MD notified (2nd page):  Time of second page:  Responding MD:  Erin Hearing, PA  Time MD responded:  Did not respond.

## 2013-06-29 NOTE — Progress Notes (Signed)
CSW continues to follow case and will facilitate discharge back to [facility] when pt is medically ready.    Ky Barban, MSW, [facility] Clinical Social Worker [phone]

## 2013-06-29 NOTE — Plan of Care (Signed)
New orders entered earlier today allow sats to decrease to 88% since pt is retainer of CO2.  Erin Hearing ANP

## 2013-06-29 NOTE — Progress Notes (Signed)
Pt heart rate jumped to 140s-150s without activity.  O2 sats dropped as low as 89% no 1L [place].  O2 flow rate increased to 2L and sats increased to 94%.  HR remains uncontrolled at this time, fluctuating between 120s-150s.  Erin Hearing, NP paged and made aware.

## 2013-06-29 NOTE — Progress Notes (Signed)
[facility]  TEAM 1 - Stepdown/ICU TEAM  Kassadee Laramore [id number] DOB: [birth year] DOA: 06/25/2013 PCP: [name], MD  Brief narrative: 80 year old female patient who resides at [facility]. Sent to the ER because of unresponsive state. According to the family patient had shortness of breath for the past 7 days. The facility placed on oxygen with improvement in symptoms until the day before presentation when patient became sleepy and would not wake up. According to the admission note the facility was not sure how long the patient was actually unresponsive. Apparently an influenza screen was checked at facility which was negative. She has had several weeks of progressive lower extremity edema. She did follow up with Dr. Meda Coffee with Cardiology and was recently switched to Xarelto from Coumadin and ECHO planned as OP. Recently started on Levaquin for presumed pneumonia.  Despite being normotensive patient was mildly tachycardic and hypoxic requiring 6 L of oxygen, because of her altered level of consciousness the sepsis protocol was initiated. ABG demonstrated respiratory acidosis with a pH of 7.22 and a PCO2 in the 70s. She was subsequently placed on BiPAP. Telemetry revealed atrial fibrillation with RVR. She also had extensive 3-4+ bilateral lower extremity edema.  Assessment/Plan:  Acute respiratory failure with hypercapnia:   A) Diastolic CHF + Pulm HTN   B) PNA -IVFs initiated in ER - dc'd -had weaned to 1 L oxygen but incr lethargy 1/14- ABG recurrent hypercarbia so resumed BiPAP-now back on 2 L- pt awake and communicative- says is hungry -decr. Lasix to 40 mg IV QD 1/15- BMET stable today- ?? over diuresed so NS bolus and dc Lasix (see below) -ECHO this admit c/w preserved LV systolic function with mild LVH-EF 50-60%, no regional wall motion abnormalities, moderate dilatation left atrium, mild dilatation right ventricle, mild dilatation right atrium, mild to moderate pulmonary  hypertension with a peak PA pressure 41 mmHg -No focal infiltrate CXR-doubt PNA although patient does have productive cough so as precaution continue meropenem- d/c vancomycin  Abnormal blood cx's -1/2 bottles positive for coag negative staph- - d/c Vanc -urine cx negative  Metabolic encephalopathy -Resolving- SLP eval for diet appropriateness: rec D3 but due to ill fitting dentures if does not tolerate back down to D2- known history esophageal stenosis but this not reason for consult -Follow  Atrial fibrillation -Rate controlled earlier but now with resting tachycardia with rates up to 140-150 -adjust short acting PO cardizem- increase form TID to QID-cont scheduled IV Lopressor -give 1x IV Cardizem 20 mg to see if can break tachycardia -may have over diuresed so give 1x NS blous 250 cc -given advanced age and preserved LV systolic fxn dc digoxin  Hyponatremia/Hypochloremia -Consistent with volume overload -Follow lab  Long term use of anticoagulants  -Xarelto preadmission-resume and DC Lovenox -Low likelihood of DVT (see below)  Senile dementia, uncomplicated  Bilateral lower extremity edema -Diurese as above -Echo does not explain the degree of edema therefore we checked LE venous duplex studies: no DVT -albumin 2.4 so suspect dysmobility and low albumin as etiology, as well as pulm HTN and diastolic CHF, w/ probable venous insuf as well   DVT prophylaxis: Transitioning back to Xarelto Code Status:  DO NOT RESUSCITATE Family Communication: With daughter at bedside 1/15 Disposition Plan/Expected LOS: Remain in SDU due to tachycardia  Consultants: None  Procedures: 2-D echocardiogram  - Left ventricle: The cavity size was normal. Wall thickness was increased in a pattern of mild LVH. Systolic function was normal. The estimated ejection fraction was  in the range of 55% to 60%. Wall motion was normal; there were no regional wall motion abnormalities. - Aortic valve:  Trivial regurgitation. - Left atrium: The atrium was mildly to moderately dilated. - Right ventricle: The cavity size was mildly dilated. - Right atrium: The atrium was mildly dilated. - Pulmonary arteries: PA peak pressure: 33mm Hg (S).  Lower extremity venous duplex -negative  Antibiotics: Maxipime 1/12 >>> Vancomycin 1/12 >>>  HPI/Subjective: Seems more sleepy today but awake and attempting to eat. Note no family at bedside and was more animated yesterday in their presence.  Objective: Blood pressure 109/55, pulse 87, temperature 97.7 F (36.5 C), temperature source Oral, resp. rate 17, height 5\' 3"  (1.6 m), weight 209 lb 7 oz (95 kg), SpO2 100.00%.  Intake/Output Summary (Last 24 hours) at 06/29/13 1039 Last data filed at 06/29/13 0500  Gross per 24 hour  Intake     50 ml  Output   1350 ml  Net  -1300 ml   Exam: General: No acute resp distress at time of exam  Lungs: Coarse to auscultation bilaterally with crackles, 1 L Cardiovascular: Irregular rate and rhythm without murmur gallop or rub normal S1 and S2, now with trace to 1+ bilateral peripheral edema-thick neck so difficult to determine if true JVD Abdomen: Nontender, nondistended, soft, bowel sounds positive, no rebound, no ascites, no appreciable mass Musculoskeletal: No significant cyanosis, clubbing of bilateral lower extremities Neurological: Awake and oriented to name and place, exquisitely weak movement of extremities but this is near her baseline since was essentially bedbound at facility and was a total lift to get out of bed to chair or to wheelchair and had trunk instability at baseline according to her family  Scheduled Meds:  Scheduled Meds: . ceFEPime (MAXIPIME) IV  1 g Intravenous Q12H  . enoxaparin (LOVENOX) injection  100 mg Subcutaneous Q12H  . furosemide  40 mg Intravenous Daily  . metoprolol  5 mg Intravenous Q6H  . potassium chloride  40 mEq Oral Once  . sodium chloride  3 mL Intravenous Q12H    Data Reviewed: Basic Metabolic Panel:  Recent Labs Lab 06/25/13 1135 06/26/13 0235 06/27/13 0333 06/28/13 0338 06/29/13 0850  NA 123* 124* 127* 134* 138  K 5.0 5.2 3.8 5.1 3.6*  CL 78* 78* 78* 80* 84*  CO2 41* 40* 43* 45* 44*  GLUCOSE 101* 87 79 68* 85  BUN 11 8 10 11 11   CREATININE 0.40* 0.36* 0.39* 0.56 0.44*  CALCIUM 8.9 8.4 8.7 8.6 8.8   Liver Function Tests:  Recent Labs Lab 06/25/13 1135 06/26/13 0235  AST 22 38*  ALT 19 16  ALKPHOS 87 62  BILITOT 0.5 0.5  PROT 7.3 6.1  ALBUMIN 3.0* 2.4*   CBC:  Recent Labs Lab 06/25/13 1135 06/26/13 0235 06/27/13 0333 06/28/13 0338  WBC 8.0 6.7 8.4 6.9  NEUTROABS 5.9  --   --   --   HGB 13.7 12.3 12.8 15.5*  HCT 41.3 38.8 40.0 47.1*  MCV 90.2 93.0 92.0 93.1  PLT 357 308 315 218   Cardiac Enzymes:  Recent Labs Lab 06/25/13 2115 06/26/13 0235 06/26/13 0840  TROPONINI <0.30 <0.30 <0.30   BNP (last 3 results)  Recent Labs  06/25/13 1337  PROBNP 3381.0*   CBG: No results found for this basename: GLUCAP,  in the last 168 hours  Recent Results (from the past 240 hour(s))  URINE CULTURE     Status: None   Collection Time  06/25/13 11:24 AM      Result Value Range Status   Specimen Description URINE, RANDOM   Final   Special Requests NONE   Final   Culture  Setup Time     Final   Value: 06/25/2013 16:17     Performed at [place]     Final   Value: NO GROWTH     Performed at [facility]   Culture     Final   Value: NO GROWTH     Performed at [facility]   Report Status 06/26/2013 FINAL   Final  CULTURE, BLOOD (ROUTINE X 2)     Status: None   Collection Time    06/25/13 11:40 AM      Result Value Range Status   Specimen Description BLOOD RIGHT ANTECUBITAL   Final   Special Requests BOTTLES DRAWN AEROBIC ONLY 5MLS   Final   Culture  Setup Time     Final   Value: 06/25/2013 16:02     Performed at [facility]   Culture     Final   Value:  STAPHYLOCOCCUS SPECIES (COAGULASE NEGATIVE)     Note: THE SIGNIFICANCE OF ISOLATING THIS ORGANISM FROM A SINGLE SET OF BLOOD CULTURES WHEN MULTIPLE SETS ARE DRAWN IS UNCERTAIN. PLEASE NOTIFY THE MICROBIOLOGY DEPARTMENT WITHIN ONE WEEK IF SPECIATION AND SENSITIVITIES ARE REQUIRED.     Note: Gram Stain Report Called to,Read Back By and Verified With: STEPHANIE SHAW 06/26/13 1345 BY SMITHERSJ     Performed at [facility]   Report Status 06/27/2013 FINAL   Final  CULTURE, BLOOD (ROUTINE X 2)     Status: None   Collection Time    06/25/13 11:45 AM      Result Value Range Status   Specimen Description BLOOD RIGHT HAND   Final   Special Requests BOTTLES DRAWN AEROBIC ONLY 5MLS   Final   Culture  Setup Time     Final   Value: 06/25/2013 16:03     Performed at [facility]   Culture     Final   Value:        BLOOD CULTURE RECEIVED NO GROWTH TO DATE CULTURE WILL BE HELD FOR 5 DAYS BEFORE ISSUING A FINAL NEGATIVE REPORT     Performed at [facility]   Report Status PENDING   Incomplete  MRSA PCR SCREENING     Status: None   Collection Time    06/25/13  8:20 PM      Result Value Range Status   MRSA by PCR NEGATIVE  NEGATIVE Final   Comment:            The GeneXpert MRSA Assay (FDA     approved for NASAL specimens     only), is one component of a     comprehensive MRSA colonization     surveillance program. It is not     intended to diagnose MRSA     infection nor to guide or     monitor treatment for     MRSA infections.     Studies:  Recent x-ray studies have been reviewed in detail by the Attending Physician  Time spent : [place], ANP [facility] Office  [phone] Pager [phone]  **If unable to reach the above provider after paging please contact the [facility] @ [phone]  On-Call/Text Page:      [email or web address]  password [email or web address]  If 7PM-7AM, please contact night-coverage [email or web address] Password [email or web address] 06/29/2013, 10:39  AM   LOS: 4 days   I have examined the patient, reviewed the chart and modified the above note which I agree with.   Wenda Vanschaick,MD [id number] 06/29/2013, 4:43 PM

## 2013-06-29 NOTE — Progress Notes (Addendum)
ANTICOAGULATION CONSULT NOTE - Follow-up  Pharmacy Consult for lovenox Indication: atrial fibrillation  Allergies  Allergen Reactions  . Chocolate     unknown  . Ciprofloxacin     unknown  . Coffee Bean Extract [Coffea Arabica]     unknown  . Gentamycin [Gentamicin]     unknown  . Imodium [Loperamide]     unknown  . Penicillins     unknown  . Sulfa Antibiotics     unknown  . Tea     unknown    Patient Measurements: Height: 5\' 3"  (160 cm) Weight: 209 lb 7 oz (95 kg) IBW/kg (Calculated) : 52.4  Vital Signs: Temp: 97.7 F (36.5 C) (01/16 0841) Temp src: Oral (01/16 0841) BP: 109/55 mmHg (01/16 0841) Pulse Rate: 87 (01/16 0841)  Labs:  Recent Labs  06/27/13 0333 06/28/13 0338  HGB 12.8 15.5*  HCT 40.0 47.1*  PLT 315 218  CREATININE 0.39* 0.56    Estimated Creatinine Clearance: 54.3 ml/min (by C-G formula based on Cr of 0.56).  Assessment: 26 yof on chronic xarelto for afib presented to the hospital from nursing home unresponsive. She is currently unable to take xarelto so she continues on full dose lovenox. H/H slightly elevated yesterday and plts are WNL. No bleeding noted. Dose is appropriate but may need a reduction if pt continues to lose weight.   Goal of Therapy:  Anti-Xa level 0.6-1 units/ml 4hrs after LMWH dose given Monitor platelets by anticoagulation protocol: Yes   Plan:  1. Continue Lovenox 100mg  SQ Q12H 2. CBC Q72H while on lovenox 3. F/u ability to take PO and restart xarelto  Robyn Nohr, Rande Lawman 06/29/2013,9:40 AM  Addendum: To restart xarelto today. Home dose is appropriate (weight 95kg, age 80, Scr 0.44).  Plan: 1. Xarelto 20mg  PO daily 2. F/u renal fxn, S&S of bleeding and ability to take PO's  Salome Arnt, PharmD, BCPS Pager # [phone] 06/29/2013 11:27 AM

## 2013-06-30 DIAGNOSIS — I4891 Unspecified atrial fibrillation: Secondary | ICD-10-CM | POA: Diagnosis not present

## 2013-06-30 DIAGNOSIS — J9600 Acute respiratory failure, unspecified whether with hypoxia or hypercapnia: Secondary | ICD-10-CM | POA: Diagnosis not present

## 2013-06-30 LAB — CBC
HCT: 41.9 % (ref 36.0–46.0)
Hemoglobin: 12.8 g/dL (ref 12.0–15.0)
MCH: 29.4 pg (ref 26.0–34.0)
MCHC: 30.5 g/dL (ref 30.0–36.0)
MCV: 96.1 fL (ref 78.0–100.0)
Platelets: 341 10*3/uL (ref 150–400)
RBC: 4.36 MIL/uL (ref 3.87–5.11)
RDW: 15.2 % (ref 11.5–15.5)
WBC: 7.9 10*3/uL (ref 4.0–10.5)

## 2013-06-30 LAB — BASIC METABOLIC PANEL
BUN: 12 mg/dL (ref 6–23)
CALCIUM: 8.6 mg/dL (ref 8.4–10.5)
CO2: >45 mEq/L (ref 19–32)
CREATININE: 0.46 mg/dL — AB (ref 0.50–1.10)
Chloride: 83 mEq/L — ABNORMAL LOW (ref 96–112)
GFR calc non Af Amer: 87 mL/min — ABNORMAL LOW (ref >90)
Glucose, Bld: 90 mg/dL (ref 70–99)
Potassium: 3.8 mEq/L (ref 3.7–5.3)
Sodium: 136 mEq/L — ABNORMAL LOW (ref 137–147)

## 2013-06-30 MED ORDER — POLYVINYL ALCOHOL 1.4 % OP SOLN
2.0000 [drp] | OPHTHALMIC | Status: DC | PRN
  Filled 2013-06-30: qty 15

## 2013-06-30 MED ORDER — DILTIAZEM HCL ER COATED BEADS 120 MG PO CP24
120.0000 mg | ORAL_CAPSULE | Freq: Every day | ORAL | Status: DC
  Administered 2013-06-30 – 2013-07-02 (×3): 120 mg via ORAL
  Filled 2013-06-30 (×3): qty 1

## 2013-06-30 MED ORDER — PANTOPRAZOLE SODIUM 40 MG PO TBEC
40.0000 mg | DELAYED_RELEASE_TABLET | Freq: Every day | ORAL | Status: DC
  Administered 2013-06-30 – 2013-07-02 (×3): 40 mg via ORAL
  Filled 2013-06-30 (×3): qty 1

## 2013-06-30 MED ORDER — DIGOXIN 125 MCG PO TABS
0.1250 mg | ORAL_TABLET | ORAL | Status: DC
  Administered 2013-06-30 – 2013-07-01 (×2): 0.125 mg via ORAL
  Filled 2013-06-30 (×2): qty 1

## 2013-06-30 MED ORDER — FUROSEMIDE 40 MG PO TABS
40.0000 mg | ORAL_TABLET | Freq: Every day | ORAL | Status: DC
  Administered 2013-06-30 – 2013-07-02 (×3): 40 mg via ORAL
  Filled 2013-06-30 (×3): qty 1

## 2013-06-30 MED ORDER — POLYETHYL GLYCOL-PROPYL GLYCOL 0.4-0.3 % OP SOLN: 2.0000 [drp] | Freq: Two times a day (BID) | OPHTHALMIC | Status: DC

## 2013-06-30 MED ORDER — OLANZAPINE 2.5 MG PO TABS
2.5000 mg | ORAL_TABLET | Freq: Every evening | ORAL | Status: DC
  Administered 2013-06-30 – 2013-07-01 (×2): 2.5 mg via ORAL
  Filled 2013-06-30 (×3): qty 1

## 2013-06-30 MED ORDER — DIVALPROEX SODIUM 125 MG PO CPSP
125.0000 mg | ORAL_CAPSULE | Freq: Two times a day (BID) | ORAL | Status: DC
  Administered 2013-06-30 – 2013-07-02 (×4): 125 mg via ORAL
  Filled 2013-06-30 (×5): qty 1

## 2013-06-30 MED ORDER — BENZONATATE 100 MG PO CAPS
100.0000 mg | ORAL_CAPSULE | Freq: Three times a day (TID) | ORAL | Status: DC | PRN
  Filled 2013-06-30: qty 1

## 2013-06-30 NOTE — Progress Notes (Addendum)
NURSING PROGRESS NOTE  Jennifer Nielsen [id number] Transfer Data: 06/30/2013 6:50 PM Attending Provider: Debbe Odea, MD [place], MD Code Status: dnr   Jennifer Nielsen is a 80 y.o. female patient transferred from 3[facility]  -No acute distress noted.  -No complaints of shortness of breath.  -No complaints of chest pain.   Cardiac Monitoring: Box # [id number] in place. Cardiac monitor yields:atrial fibrillation, with ventricular rate of 93.  Blood pressure 154/80, pulse 81, temperature 98.1 F (36.7 C), temperature source Oral, resp. rate 28, height 5\' 5"  (1.651 m), weight 93.9 kg (207 lb 0.2 oz), SpO2 100.00%.   IV Fluids:  IV in place, occlusive dsg intact without redness, IV cath forearm left, condition patent and no redness none.   Allergies:  Chocolate; Ciprofloxacin; Coffee bean extract; Gentamycin; Imodium; Penicillins; Sulfa antibiotics; and Tea  Past Medical History:   has a past medical history of Arrhythmia; Arthritis; Atrial fibrillation; H/O blood clots; Hyperlipemia; Dementia; GERD (gastroesophageal reflux disease); and Osteoarthritis.  Past Surgical History:   has past surgical history that includes Appendectomy; Gallbladder surgery; Hip surgery; and Leg Surgery.  Social History:   reports that she has quit smoking. She has never used smokeless tobacco. She reports that she does not drink alcohol or use illicit drugs.  Skin: red in groins  Patient/Family orientated to room. Information packet given to patient/family. Admission inpatient armband information verified with patient/family to include name and date of birth and placed on patient arm. Side rails up x 2, fall assessment and education completed with patient/family. Patient/family able to verbalize understanding of risk associated with falls and verbalized understanding to call for assistance before getting out of bed. Call light within reach. Patient/family able to voice and demonstrate understanding of unit orientation  instructions.    Will continue to evaluate and treat per MD orders.  [name], MSN, RN, [employer]

## 2013-06-30 NOTE — Evaluation (Signed)
Physical Therapy Evaluation Patient Details Name: Jennifer Nielsen MRN: [id number] DOB: 21-Dec-1924 Today's Date: 06/30/2013 Time: 5643-3295 PT Time Calculation (min): 14 min  PT Assessment / Plan / Recommendation History of Present Illness  80 year old female patient who resides at [facility]. Sent to the ER because of unresponsive state. Despite being normotensive patient was mildly tachycardic and hypoxic requiring 6 L of oxygen, because of her altered level of consciousness the sepsis protocol was initiated. ABG demonstrated respiratory acidosis with a pH of 7.22 and a PCO2 in the 70s. She was subsequently placed on BiPAP. Telemetry revealed atrial fibrillation with RVR. She also had extensive 3-4+ bilateral lower extremity edema.  Clinical Impression  Pt adm due to the above. Pt greatly limited with mobility secondary to deficits indicated below. Pt limited in evaluation due to fatigue and weakness. Pt to benefit from acute PT to address deficits listed below and increase independence with mobility and transfers to return to PLOF. Pt will require total care upon return to [facility] at this time. Will assess transfers next session. Pt greatly fatigued with bed mobility today. Will require 2+ (A) for mobility and transfers.     PT Assessment  Patient needs continued PT services    Follow Up Recommendations  SNF;Supervision/Assistance - 24 hour    Does the patient have the potential to tolerate intense rehabilitation      Barriers to Discharge   lives at [place] Recommendations  None recommended by PT    Recommendations for Other Services     Frequency Min 2X/week    Precautions / Restrictions Precautions Precautions: Fall Restrictions Weight Bearing Restrictions: No   Pertinent Vitals/Pain VSS.       Mobility  Bed Mobility Overal bed mobility: Needs Assistance Bed Mobility: Rolling (scooting HOB) Rolling: Total assist General bed mobility comments: pt rolled  Rt and Lt to (A) with hygiene; pt had soiled the bed; required total (A) and max cues for hand placement and to perform; pt scooted to [facility] with 2 person (A) with use of draw pad. pt very fatigued after cleaning and did not attempt transfers today    Exercises General Exercises - Lower Extremity Ankle Circles/Pumps: AROM;Both;Strengthening;10 reps Heel Slides: AROM;5 reps;Both;Other (comment) (limited ROM due to decr strength)   PT Diagnosis: Difficulty walking;Generalized weakness  PT Problem List: Decreased strength;Decreased range of motion;Decreased activity tolerance;Decreased mobility;Decreased cognition;Decreased knowledge of use of DME;Decreased safety awareness;Decreased knowledge of precautions;Obesity PT Treatment Interventions: DME instruction;Gait training;Functional mobility training;Therapeutic activities;Therapeutic exercise;Balance training;Neuromuscular re-education;Patient/family education     PT Goals(Current goals can be found in the care plan section) Acute Rehab PT Goals Patient Stated Goal: none stated PT Goal Formulation: With patient Time For Goal Achievement: 07/14/13 Potential to Achieve Goals: Fair  Visit Information  Last PT Received On: 06/30/13 Assistance Needed: +2 History of Present Illness: 80 year old female patient who resides at [facility]. Sent to the ER because of unresponsive state. Despite being normotensive patient was mildly tachycardic and hypoxic requiring 6 L of oxygen, because of her altered level of consciousness the sepsis protocol was initiated. ABG demonstrated respiratory acidosis with a pH of 7.22 and a PCO2 in the 70s. She was subsequently placed on BiPAP. Telemetry revealed atrial fibrillation with RVR. She also had extensive 3-4+ bilateral lower extremity edema.       Prior [place] expects to be discharged to:: Skilled nursing facility Additional Comments: Pt from [place] and plans to return  Prior  Function  Level of Independence: Needs assistance Gait / Transfers Assistance Needed: (A) required  ADL's / Homemaking Assistance Needed: staffing (A)  Comments: pt stated she was able to stand with (A) from staff and transfer to her wheelchair. she reports she has been working with therapy at [facility]. no family present to determine accuracy  Communication Communication: No difficulties    Cognition  Cognition Arousal/Alertness: Awake/alert Behavior During Therapy: WFL for tasks assessed/performed Overall Cognitive Status: No family/caregiver present to determine baseline cognitive functioning Area of Impairment: Orientation;Following commands;Problem solving Orientation Level: Disoriented to;Situation Memory: Decreased short-term memory Following Commands: Follows one step commands consistently;Follows one step commands with increased time Problem Solving: Difficulty sequencing;Slow processing;Requires verbal cues;Requires tactile cues    Extremity/Trunk Assessment Upper Extremity Assessment Upper Extremity Assessment: Defer to OT evaluation Lower Extremity Assessment Lower Extremity Assessment: Generalized weakness   Balance    End of Session PT - End of Session Equipment Utilized During Treatment: Oxygen Activity Tolerance: Patient limited by fatigue Patient left: in bed;with call bell/phone within reach Nurse Communication: Mobility status;Need for lift equipment  GP     Gustavus Bryant, [place] 06/30/2013, 5:32 PM

## 2013-06-30 NOTE — Progress Notes (Addendum)
[facility] [place] TEAM 1 - Stepdown/ICU TEAM  Jennifer Nielsen JIR:[id number] DOB: 09-Nov-1924 DOA: 06/25/2013 PCP: [name], MD  Brief narrative: 80 year old female patient who resides at [facility]. Sent to the ER because of unresponsive state. According to the family patient had shortness of breath for the past 7 days. The facility placed on oxygen with improvement in symptoms until the day before presentation when patient became sleepy and would not wake up. According to the admission note the facility was not sure how long the patient was actually unresponsive. Apparently an influenza screen was checked at facility which was negative. She has had several weeks of progressive lower extremity edema. She did follow up with Dr. Meda Coffee with Cardiology and was recently switched to Xarelto from Coumadin and ECHO planned as OP. Recently started on Levaquin for presumed pneumonia.  Despite being normotensive patient was mildly tachycardic and hypoxic requiring 6 L of oxygen, because of her altered level of consciousness the sepsis protocol was initiated. ABG demonstrated respiratory acidosis with a pH of 7.22 and a PCO2 in the 70s. She was subsequently placed on BiPAP. Telemetry revealed atrial fibrillation with RVR. She also had extensive 3-4+ bilateral lower extremity edema.  Assessment/Plan:  Acute respiratory failure with hypercapnia:   A) Diastolic CHF + Pulm HTN   B) acute bronchitis -IVFs initiated in ER - dc'd -has weaned to 1 L oxygen  -diuresed effectively- held Lasix for suspicion of over diuresis and dehydration-  -ECHO this admit c/w preserved LV systolic function with mild LVH-EF 50-60%, no regional wall motion abnormalities, moderate dilatation left atrium, mild dilatation right ventricle, mild dilatation right atrium, mild to moderate pulmonary hypertension with a peak PA pressure 41 mmHg -No focal infiltrate CXR-doubt PNA although patient does have productive cough therefore,  continued Cefepime- will d/c today  Abnormal blood cx's -1/2 bottles positive for coag negative staph- - d/c Vanc -urine cx negative  Metabolic encephalopathy -Resolved  Atrial fibrillation -Rate became uncontrolled on 1/15 due to reduction in home Cardizem dose which was done for hypotension- we started her on a lower dose of short acting Cardizem and will now convert her back to a long acting dose - still is lower than home dose - watch BP and titrate up as needed  Hyponatremia/Hypochloremia -stable over the past few days  Long term use of anticoagulants  -Xarelto   Senile dementia, uncomplicated  Bilateral lower extremity edema -resolved with diuresis -Echo does not explain the degree of edema therefore we checked LE venous duplex studies: no DVT -albumin 2.4 so suspect dysmobility and low albumin as etiology, as well as pulm HTN and diastolic CHF, w/ probable venous insuf as well   DVT prophylaxis:  Xarelto Code Status:  DO NOT RESUSCITATE Family Communication: With daughter at bedside 1/15 Disposition Plan/Expected LOS: transfer to telemetry  Consultants: None  Procedures: 2-D echocardiogram  - Left ventricle: The cavity size was normal. Wall thickness was increased in a pattern of mild LVH. Systolic function was normal. The estimated ejection fraction was in the range of 55% to 60%. Wall motion was normal; there were no regional wall motion abnormalities. - Aortic valve: Trivial regurgitation. - Left atrium: The atrium was mildly to moderately dilated. - Right ventricle: The cavity size was mildly dilated. - Right atrium: The atrium was mildly dilated. - Pulmonary arteries: PA peak pressure: 30mm Hg (S).  Lower extremity venous duplex -negative  Antibiotics: Maxipime 1/12 >>>1/17 Vancomycin 1/12 >>>1/15  HPI/Subjective: Alert- no complaints  Objective: Blood  pressure 129/49, pulse 80, temperature 97.5 F (36.4 C), temperature source Axillary, resp.  rate 24, height 5\' 3"  (1.6 m), weight 93.2 kg (205 lb 7.5 oz), SpO2 99.00%.  Intake/Output Summary (Last 24 hours) at 06/30/13 1323 Last data filed at 06/30/13 1011  Gross per 24 hour  Intake    100 ml  Output    800 ml  Net   -700 ml   Exam: General: No acute resp distress at time of exam  Lungs: CTA b/l  Cardiovascular: Irregular rate and rhythm without murmur gallop or rub normal S1 and S2, now with trace to 1+ bilateral peripheral edema-thick neck so difficult to determine if true JVD Abdomen: Nontender, nondistended, soft, bowel sounds positive, no rebound, no ascites, no appreciable mass Musculoskeletal: No significant cyanosis, clubbing of bilateral lower extremities Neurological: Awake and oriented to name and place, exquisitely weak movement of extremities but this is near her baseline since was essentially bedbound at facility and was a total lift to get out of bed to chair   Scheduled Meds:  Scheduled Meds: . ceFEPime (MAXIPIME) IV  1 g Intravenous Q12H  . digoxin  0.125 mg Oral Q T,Th,S,Su  . diltiazem  120 mg Oral Daily  . rivaroxaban  20 mg Oral Q supper  . sodium chloride  250 mL Intravenous Once  . sodium chloride  3 mL Intravenous Q12H   Data Reviewed: Basic Metabolic Panel:  Recent Labs Lab 06/26/13 0235 06/27/13 0333 06/28/13 0338 06/29/13 0850 06/30/13 0320  NA 124* 127* 134* 138 136*  K 5.2 3.8 5.1 3.6* 3.8  CL 78* 78* 80* 84* 83*  CO2 40* 43* 45* 44* >45*  GLUCOSE 87 79 68* 85 90  BUN 8 10 11 11 12   CREATININE 0.36* 0.39* 0.56 0.44* 0.46*  CALCIUM 8.4 8.7 8.6 8.8 8.6   Liver Function Tests:  Recent Labs Lab 06/25/13 1135 06/26/13 0235  AST 22 38*  ALT 19 16  ALKPHOS 87 62  BILITOT 0.5 0.5  PROT 7.3 6.1  ALBUMIN 3.0* 2.4*   CBC:  Recent Labs Lab 06/25/13 1135 06/26/13 0235 06/27/13 0333 06/28/13 0338 06/30/13 0320  WBC 8.0 6.7 8.4 6.9 7.9  NEUTROABS 5.9  --   --   --   --   HGB 13.7 12.3 12.8 15.5* 12.8  HCT 41.3 38.8 40.0  47.1* 41.9  MCV 90.2 93.0 92.0 93.1 96.1  PLT 357 308 315 218 341   Cardiac Enzymes:  Recent Labs Lab 06/25/13 2115 06/26/13 0235 06/26/13 0840  TROPONINI <0.30 <0.30 <0.30   BNP (last 3 results)  Recent Labs  06/25/13 1337  PROBNP 3381.0*   CBG: No results found for this basename: GLUCAP,  in the last 168 hours  Recent Results (from the past 240 hour(s))  URINE CULTURE     Status: None   Collection Time    06/25/13 11:24 AM      Result Value Range Status   Specimen Description URINE, RANDOM   Final   Special Requests NONE   Final   Culture  Setup Time     Final   Value: 06/25/2013 16:17     Performed at [place]     Final   Value: NO GROWTH     Performed at [facility]   Culture     Final   Value: NO GROWTH     Performed at [facility]   Report Status 06/26/2013 FINAL  Final  CULTURE, BLOOD (ROUTINE X 2)     Status: None   Collection Time    06/25/13 11:40 AM      Result Value Range Status   Specimen Description BLOOD RIGHT ANTECUBITAL   Final   Special Requests BOTTLES DRAWN AEROBIC ONLY 5MLS   Final   Culture  Setup Time     Final   Value: 06/25/2013 16:02     Performed at [facility]   Culture     Final   Value: STAPHYLOCOCCUS SPECIES (COAGULASE NEGATIVE)     Note: THE SIGNIFICANCE OF ISOLATING THIS ORGANISM FROM A SINGLE SET OF BLOOD CULTURES WHEN MULTIPLE SETS ARE DRAWN IS UNCERTAIN. PLEASE NOTIFY THE MICROBIOLOGY DEPARTMENT WITHIN ONE WEEK IF SPECIATION AND SENSITIVITIES ARE REQUIRED.     Note: Gram Stain Report Called to,Read Back By and Verified With: STEPHANIE SHAW 06/26/13 1345 BY SMITHERSJ     Performed at [facility]   Report Status 06/27/2013 FINAL   Final  CULTURE, BLOOD (ROUTINE X 2)     Status: None   Collection Time    06/25/13 11:45 AM      Result Value Range Status   Specimen Description BLOOD RIGHT HAND   Final   Special Requests BOTTLES DRAWN AEROBIC ONLY 5MLS   Final    Culture  Setup Time     Final   Value: 06/25/2013 16:03     Performed at [facility]   Culture     Final   Value:        BLOOD CULTURE RECEIVED NO GROWTH TO DATE CULTURE WILL BE HELD FOR 5 DAYS BEFORE ISSUING A FINAL NEGATIVE REPORT     Performed at [facility]   Report Status PENDING   Incomplete  MRSA PCR SCREENING     Status: None   Collection Time    06/25/13  8:20 PM      Result Value Range Status   MRSA by PCR NEGATIVE  NEGATIVE Final   Comment:            The GeneXpert MRSA Assay (FDA     approved for NASAL specimens     only), is one component of a     comprehensive MRSA colonization     surveillance program. It is not     intended to diagnose MRSA     infection nor to guide or     monitor treatment for     MRSA infections.     Studies:  Recent x-ray studies have been reviewed in detail by the Attending Physician  Time spent : 20 mins  .   Lashaundra Lehrmann,MD [id number] 06/30/2013, 1:23 PM

## 2013-07-01 DIAGNOSIS — G9341 Metabolic encephalopathy: Secondary | ICD-10-CM | POA: Diagnosis not present

## 2013-07-01 DIAGNOSIS — E878 Other disorders of electrolyte and fluid balance, not elsewhere classified: Secondary | ICD-10-CM | POA: Diagnosis not present

## 2013-07-01 DIAGNOSIS — I4891 Unspecified atrial fibrillation: Secondary | ICD-10-CM | POA: Diagnosis not present

## 2013-07-01 DIAGNOSIS — J9600 Acute respiratory failure, unspecified whether with hypoxia or hypercapnia: Secondary | ICD-10-CM | POA: Diagnosis not present

## 2013-07-01 LAB — CULTURE, BLOOD (ROUTINE X 2): Culture: NO GROWTH

## 2013-07-01 MED ORDER — ALBUTEROL SULFATE (2.5 MG/3ML) 0.083% IN NEBU: 2.5000 mg | INHALATION_SOLUTION | RESPIRATORY_TRACT | Status: DC | PRN

## 2013-07-01 MED ORDER — LEVALBUTEROL HCL 0.63 MG/3ML IN NEBU
0.6300 mg | INHALATION_SOLUTION | Freq: Three times a day (TID) | RESPIRATORY_TRACT | Status: DC
  Administered 2013-07-01 – 2013-07-02 (×4): 0.63 mg via RESPIRATORY_TRACT
  Filled 2013-07-01 (×7): qty 3

## 2013-07-01 NOTE — Progress Notes (Addendum)
PATIENT DETAILS Name: Jennifer Nielsen Age: 80 y.o. Sex: female Date of Birth: Feb 05, 1925 Admit Date: 06/25/2013 Admitting Physician Geradine Girt, DO PCP:DAY,JAMES, MD  Subjective: No major issues overnight. Not in any distress this morning. Answers all my questions appropriately.  Assessment/Plan: Active Problems: Acute respiratory failure with hypercapnia - Suspect this to be multifactorial- encephalopathy, hyponatremia, CHF decompensation. Not sure if underlying COPD history - Better, however will check a ABG today to see if she is a chronic CO2 retainer and check a pH.  Acute diastolic heart failure - Treated with IV Lasix initially, 8 L negative balance since admission, weight down to 92.9 KG his from 102.9 KG is on admission - Significant improvement and almost compensated, continue with Lasix 40 mg daily. - Echocardiogram on 06/26/13-EF 55-60%.  Atrial fibrillation - Controlled with Cardizem and digoxin. - On Xarelto, cautiously continue-as patient will be discharged to SNF and will have supervision and assistance with mobility. If in the future, she were to have frequent falls, this can then be discontinued.  History of venous thromboembolism - Reviewed prior outpatient notes-patient has a history of having DVT in the past. Lower extremity Dopplers negative this admission - On chronic Xarelto  Acute metabolic encephalopathy - From CO2 narcosis-resolved.  Hyponatremia - Suspected secondary to congestive heart failure - Resolved with diuresis  Suspected history of COPD/OSA - Will place on scheduled nebulized bronchodilators.  Gastroesophageal reflux disease - Continue PPI  Dementia - Review of the outpatient notes, apparently mild. - Continue Depakote, and olanzapine  Morbid obesity  Disposition: Remain inpatient- SNF on discharge  DVT Prophylaxis: Not needed as on Xarelto  Code Status:  DNR  Family Communication None at bedside this  morning  Procedures:  None  CONSULTS:  None  Time spent 40 minutes-which includes 50% of the time with face-to-face with patient/ family and coordinating care related to the above assessment and plan.    MEDICATIONS: Scheduled Meds: . digoxin  0.125 mg Oral Q T,Th,S,Su  . diltiazem  120 mg Oral Daily  . divalproex  125 mg Oral BID  . furosemide  40 mg Oral Daily  . OLANZapine  2.5 mg Oral QPM  . pantoprazole  40 mg Oral Daily  . rivaroxaban  20 mg Oral Q supper  . sodium chloride  250 mL Intravenous Once  . sodium chloride  3 mL Intravenous Q12H   Continuous Infusions:  PRN Meds:.acetaminophen, acetaminophen, benzonatate, levalbuterol, metoprolol, ondansetron (ZOFRAN) IV, ondansetron, polyvinyl alcohol  Antibiotics: Anti-infectives   Start     Dose/Rate Route Frequency Ordered Stop   06/26/13 1200  vancomycin (VANCOCIN) 1,500 mg in sodium chloride 0.9 % 500 mL IVPB  Status:  Discontinued     1,500 mg 250 mL/hr over 120 Minutes Intravenous Every 24 hours 06/25/13 1255 06/28/13 1711   06/25/13 2200  ceFEPIme (MAXIPIME) 1 g in dextrose 5 % 50 mL IVPB  Status:  Discontinued     1 g 100 mL/hr over 30 Minutes Intravenous Every 12 hours 06/25/13 1255 06/30/13 1332   06/25/13 1215  vancomycin (VANCOCIN) IVPB 1000 mg/200 mL premix     1,000 mg 200 mL/hr over 60 Minutes Intravenous  Once 06/25/13 1203 06/25/13 1735   06/25/13 1115  ceFEPIme (MAXIPIME) 2 g in dextrose 5 % 50 mL IVPB     2 g 100 mL/hr over 30 Minutes Intravenous  Once 06/25/13 1111 06/25/13 1438   06/25/13 1115  vancomycin (VANCOCIN) IVPB 1000 mg/200 mL premix     1,000  mg 200 mL/hr over 60 Minutes Intravenous  Once 06/25/13 1111 06/25/13 1438       PHYSICAL EXAM: Vital signs in last 24 hours: Filed Vitals:   06/30/13 1407 06/30/13 1556 06/30/13 2045 07/01/13 0519  BP:  154/80 115/70 125/74  Pulse: 87 81 80 84  Temp:  98.1 F (36.7 C) 98.1 F (36.7 C) 98.3 F (36.8 C)  TempSrc:  Oral Oral Oral  Resp:  30 28 22 22   Height:  5\' 5"  (1.651 m)    Weight:  93.9 kg (207 lb 0.2 oz)  92.9 kg (204 lb 12.9 oz)  SpO2: 98% 100% 97% 94%    Weight change: 0.7 kg (1 lb 8.7 oz) Filed Weights   06/30/13 0438 06/30/13 1556 07/01/13 0519  Weight: 93.2 kg (205 lb 7.5 oz) 93.9 kg (207 lb 0.2 oz) 92.9 kg (204 lb 12.9 oz)   Body mass index is 34.08 kg/(m^2).   Gen Exam: Awake and alert  Neck: Supple, No JVD.   Chest: B/L Clear.   CVS: S1 S2 Regular, no murmurs.  Abdomen: soft, BS +, non tender, non distended.  Extremities: 1+ edema,  Neurologic: Non Focal.   Skin: No Rash.   Wounds: N/A.   Intake/Output from previous day:  Intake/Output Summary (Last 24 hours) at 07/01/13 1115 Last data filed at 07/01/13 0900  Gross per 24 hour  Intake    438 ml  Output    425 ml  Net     13 ml     LAB RESULTS: CBC  Recent Labs Lab 06/25/13 1135 06/26/13 0235 06/27/13 0333 06/28/13 0338 06/30/13 0320  WBC 8.0 6.7 8.4 6.9 7.9  HGB 13.7 12.3 12.8 15.5* 12.8  HCT 41.3 38.8 40.0 47.1* 41.9  PLT 357 308 315 218 341  MCV 90.2 93.0 92.0 93.1 96.1  MCH 29.9 29.5 29.4 30.6 29.4  MCHC 33.2 31.7 32.0 32.9 30.5  RDW 14.9 14.8 14.6 14.7 15.2  LYMPHSABS 1.2  --   --   --   --   MONOABS 0.8  --   --   --   --   EOSABS 0.0  --   --   --   --   BASOSABS 0.0  --   --   --   --     Chemistries   Recent Labs Lab 06/26/13 0235 06/27/13 0333 06/28/13 0338 06/29/13 0850 06/30/13 0320  NA 124* 127* 134* 138 136*  K 5.2 3.8 5.1 3.6* 3.8  CL 78* 78* 80* 84* 83*  CO2 40* 43* 45* 44* >45*  GLUCOSE 87 79 68* 85 90  BUN 8 10 11 11 12   CREATININE 0.36* 0.39* 0.56 0.44* 0.46*  CALCIUM 8.4 8.7 8.6 8.8 8.6    CBG: No results found for this basename: GLUCAP,  in the last 168 hours  GFR Estimated Creatinine Clearance: 55.8 ml/min (by C-G formula based on Cr of 0.46).  Coagulation profile  Recent Labs Lab 06/25/13 1135  INR 1.60*    Cardiac Enzymes  Recent Labs Lab 06/25/13 2115 06/26/13 0235  06/26/13 0840  TROPONINI <0.30 <0.30 <0.30    No components found with this basename: POCBNP,  No results found for this basename: DDIMER,  in the last 72 hours No results found for this basename: HGBA1C,  in the last 72 hours No results found for this basename: CHOL, HDL, LDLCALC, TRIG, CHOLHDL, LDLDIRECT,  in the last 72 hours No results found for this basename: TSH, T4TOTAL, FREET3, T3FREE,  THYROIDAB,  in the last 72 hours No results found for this basename: VITAMINB12, FOLATE, FERRITIN, TIBC, IRON, RETICCTPCT,  in the last 72 hours No results found for this basename: LIPASE, AMYLASE,  in the last 72 hours  Urine Studies No results found for this basename: UACOL, UAPR, USPG, UPH, UTP, UGL, UKET, UBIL, UHGB, UNIT, UROB, ULEU, UEPI, UWBC, URBC, UBAC, CAST, CRYS, UCOM, BILUA,  in the last 72 hours  MICROBIOLOGY: Recent Results (from the past 240 hour(s))  URINE CULTURE     Status: None   Collection Time    06/25/13 11:24 AM      Result Value Range Status   Specimen Description URINE, RANDOM   Final   Special Requests NONE   Final   Culture  Setup Time     Final   Value: 06/25/2013 16:17     Performed at [place]     Final   Value: NO GROWTH     Performed at [facility]   Culture     Final   Value: NO GROWTH     Performed at [facility]   Report Status 06/26/2013 FINAL   Final  CULTURE, BLOOD (ROUTINE X 2)     Status: None   Collection Time    06/25/13 11:40 AM      Result Value Range Status   Specimen Description BLOOD RIGHT ANTECUBITAL   Final   Special Requests BOTTLES DRAWN AEROBIC ONLY 5MLS   Final   Culture  Setup Time     Final   Value: 06/25/2013 16:02     Performed at [facility]   Culture     Final   Value: STAPHYLOCOCCUS SPECIES (COAGULASE NEGATIVE)     Note: THE SIGNIFICANCE OF ISOLATING THIS ORGANISM FROM A SINGLE SET OF BLOOD CULTURES WHEN MULTIPLE SETS ARE DRAWN IS UNCERTAIN. PLEASE NOTIFY THE  MICROBIOLOGY DEPARTMENT WITHIN ONE WEEK IF SPECIATION AND SENSITIVITIES ARE REQUIRED.     Note: Gram Stain Report Called to,Read Back By and Verified With: STEPHANIE SHAW 06/26/13 1345 BY SMITHERSJ     Performed at [facility]   Report Status 06/27/2013 FINAL   Final  CULTURE, BLOOD (ROUTINE X 2)     Status: None   Collection Time    06/25/13 11:45 AM      Result Value Range Status   Specimen Description BLOOD RIGHT HAND   Final   Special Requests BOTTLES DRAWN AEROBIC ONLY 5MLS   Final   Culture  Setup Time     Final   Value: 06/25/2013 16:03     Performed at [facility]   Culture     Final   Value: NO GROWTH 5 DAYS     Performed at [facility]   Report Status 07/01/2013 FINAL   Final  MRSA PCR SCREENING     Status: None   Collection Time    06/25/13  8:20 PM      Result Value Range Status   MRSA by PCR NEGATIVE  NEGATIVE Final   Comment:            The GeneXpert MRSA Assay (FDA     approved for NASAL specimens     only), is one component of a     comprehensive MRSA colonization     surveillance program. It is not     intended to diagnose MRSA     infection nor to guide or  monitor treatment for     MRSA infections.    RADIOLOGY STUDIES/RESULTS: Dg Chest Port 1 View  06/25/2013   CLINICAL DATA:  Shortness of breath  EXAM: PORTABLE CHEST - 1 VIEW  COMPARISON:  None.  FINDINGS: 1123 hrs. The cardio pericardial silhouette is enlarged. There is pulmonary vascular congestion without overt pulmonary edema. Superimposed interstitial opacities suggest edema. Probable tiny bilateral pleural effusions. Bones are diffusely demineralized. Telemetry leads overlie the chest.  IMPRESSION: Cardiomegaly with vascular congestion and interstitial edema.   Electronically Signed   By: Misty Stanley M.D.   On: 06/25/2013 12:09    Oren Binet, MD  [facility] Pager:[phone]  If 7PM-7AM, please contact night-coverage [email or web address] Password  [email or web address] 07/01/2013, 11:15 AM   LOS: 6 days

## 2013-07-02 ENCOUNTER — Other Ambulatory Visit (HOSPITAL_COMMUNITY): Payer: Medicare Other

## 2013-07-02 DIAGNOSIS — F3289 Other specified depressive episodes: Secondary | ICD-10-CM | POA: Diagnosis not present

## 2013-07-02 DIAGNOSIS — G9341 Metabolic encephalopathy: Secondary | ICD-10-CM | POA: Diagnosis not present

## 2013-07-02 DIAGNOSIS — I4891 Unspecified atrial fibrillation: Secondary | ICD-10-CM | POA: Diagnosis not present

## 2013-07-02 DIAGNOSIS — H04129 Dry eye syndrome of unspecified lacrimal gland: Secondary | ICD-10-CM | POA: Diagnosis not present

## 2013-07-02 DIAGNOSIS — K2100 Gastro-esophageal reflux disease with esophagitis, without bleeding: Secondary | ICD-10-CM | POA: Diagnosis not present

## 2013-07-02 DIAGNOSIS — E871 Hypo-osmolality and hyponatremia: Secondary | ICD-10-CM | POA: Diagnosis not present

## 2013-07-02 DIAGNOSIS — I10 Essential (primary) hypertension: Secondary | ICD-10-CM | POA: Diagnosis not present

## 2013-07-02 DIAGNOSIS — F0281 Dementia in other diseases classified elsewhere with behavioral disturbance: Secondary | ICD-10-CM | POA: Diagnosis not present

## 2013-07-02 DIAGNOSIS — Z7901 Long term (current) use of anticoagulants: Secondary | ICD-10-CM | POA: Diagnosis not present

## 2013-07-02 DIAGNOSIS — F329 Major depressive disorder, single episode, unspecified: Secondary | ICD-10-CM | POA: Diagnosis not present

## 2013-07-02 DIAGNOSIS — I509 Heart failure, unspecified: Secondary | ICD-10-CM | POA: Diagnosis not present

## 2013-07-02 DIAGNOSIS — F22 Delusional disorders: Secondary | ICD-10-CM | POA: Diagnosis not present

## 2013-07-02 DIAGNOSIS — R293 Abnormal posture: Secondary | ICD-10-CM | POA: Diagnosis not present

## 2013-07-02 DIAGNOSIS — R609 Edema, unspecified: Secondary | ICD-10-CM | POA: Diagnosis not present

## 2013-07-02 DIAGNOSIS — Z86718 Personal history of other venous thrombosis and embolism: Secondary | ICD-10-CM | POA: Diagnosis not present

## 2013-07-02 DIAGNOSIS — F0390 Unspecified dementia without behavioral disturbance: Secondary | ICD-10-CM | POA: Diagnosis not present

## 2013-07-02 DIAGNOSIS — I80299 Phlebitis and thrombophlebitis of other deep vessels of unspecified lower extremity: Secondary | ICD-10-CM | POA: Diagnosis not present

## 2013-07-02 DIAGNOSIS — J13 Pneumonia due to Streptococcus pneumoniae: Secondary | ICD-10-CM | POA: Diagnosis not present

## 2013-07-02 DIAGNOSIS — F29 Unspecified psychosis not due to a substance or known physiological condition: Secondary | ICD-10-CM | POA: Diagnosis not present

## 2013-07-02 DIAGNOSIS — F02818 Dementia in other diseases classified elsewhere, unspecified severity, with other behavioral disturbance: Secondary | ICD-10-CM | POA: Diagnosis not present

## 2013-07-02 DIAGNOSIS — M6281 Muscle weakness (generalized): Secondary | ICD-10-CM | POA: Diagnosis not present

## 2013-07-02 DIAGNOSIS — J9600 Acute respiratory failure, unspecified whether with hypoxia or hypercapnia: Secondary | ICD-10-CM | POA: Diagnosis not present

## 2013-07-02 DIAGNOSIS — E8779 Other fluid overload: Secondary | ICD-10-CM | POA: Diagnosis not present

## 2013-07-02 DIAGNOSIS — R1312 Dysphagia, oropharyngeal phase: Secondary | ICD-10-CM | POA: Diagnosis not present

## 2013-07-02 LAB — BASIC METABOLIC PANEL
BUN: 11 mg/dL (ref 6–23)
CALCIUM: 8.4 mg/dL (ref 8.4–10.5)
CREATININE: 0.6 mg/dL (ref 0.50–1.10)
Chloride: 85 mEq/L — ABNORMAL LOW (ref 96–112)
GFR calc Af Amer: >90 mL/min (ref >90)
GFR, EST NON AFRICAN AMERICAN: 80 mL/min — AB (ref >90)
Glucose, Bld: 96 mg/dL (ref 70–99)
Potassium: 3.4 mEq/L — ABNORMAL LOW (ref 3.7–5.3)
SODIUM: 144 meq/L (ref 137–147)

## 2013-07-02 MED ORDER — BENZONATATE 100 MG PO CAPS: 100.0000 mg | ORAL_CAPSULE | Freq: Three times a day (TID) | ORAL | Status: DC | PRN

## 2013-07-02 MED ORDER — LEVALBUTEROL HCL 0.63 MG/3ML IN NEBU: 0.6300 mg | INHALATION_SOLUTION | Freq: Three times a day (TID) | RESPIRATORY_TRACT | Status: DC

## 2013-07-02 MED ORDER — DILTIAZEM HCL ER COATED BEADS 120 MG PO CP24: 120.0000 mg | ORAL_CAPSULE | Freq: Every day | ORAL | Status: DC

## 2013-07-02 MED ORDER — DOCUSATE SODIUM 100 MG PO CAPS
100.0000 mg | ORAL_CAPSULE | Freq: Every day | ORAL | Status: DC
  Administered 2013-07-02: 100 mg via ORAL
  Filled 2013-07-02: qty 1

## 2013-07-02 MED ORDER — ALBUTEROL SULFATE (2.5 MG/3ML) 0.083% IN NEBU: 2.5000 mg | INHALATION_SOLUTION | RESPIRATORY_TRACT | Status: DC | PRN

## 2013-07-02 MED ORDER — DSS 100 MG PO CAPS: 100.0000 mg | ORAL_CAPSULE | Freq: Every day | ORAL | Status: DC

## 2013-07-02 NOTE — Discharge Instructions (Signed)
Daily weights.  Monitor volume status.  If she increases more than 3 lbs in 2 days she likely needs increased lasix dose

## 2013-07-02 NOTE — Progress Notes (Signed)
CRITICAL VALUE ALERT  Critical value received:  CO2>45  Date of notification:  07/02/13  Time of notification:  0656  Critical value read back:yes  Nurse who received alert:  Ronneisha Jett, Josephine Cables  MD notified (1st page):  [place]   Time of first page:  06:59  MD notified (2nd page):  Time of second page:  Responding MD:  Sloan Leiter  Time MD responded:  [id number]  MD aware.  No new orders.  Will continue to monitor patient.

## 2013-07-02 NOTE — Clinical Social Work Note (Addendum)
2:54pm- RN called report to [facility].  CSW called [facility] for transportation. Packet is prepared and on chart.  CSW met with pt at bedside.  Pt was resting quietly in bed.  Lunch has arrived.  RN assisting pt with lunch needs.  Pt states she is ready to dc to [facility].  CSW confirmed that pt can return to [facility] today- per [place].  Pt will need FL2 and DNR signed prior to dc.  MD, RN and RNCM made aware. Pt will be transported via [facility].  RN to call report to [facility], [place]  Nonnie Done, [place] [phone]  Clinical Social Work

## 2013-07-02 NOTE — Discharge Summary (Signed)
Physician Discharge Summary  Jennifer Nielsen [id number] DOB: [birth year] DOA: 06/25/2013  PCP: [name], MD  Admit date: 06/25/2013 Discharge date: 07/02/2013  Time spent: 60 minutes  Recommendations for Outpatient Follow-up:   Monitor volume status.  Daily weights, low salt diet.  Follow up with Dr. Meda Coffee of Cardiology within one week.  bmet in 3-5 days.  Patient had variable potassium   Cardizem dose has been decreased.  Monitor pulse and BP.  Recommend a sleep study as outpatient  Discharge Diagnoses:  Principal Problem:   Acute respiratory failure with hypercapnia Active Problems:   CHF with unknown LVEF   Metabolic encephalopathy   Long term (current) use of anticoagulants   Atrial fibrillation   Senile dementia, uncomplicated   PNA (pneumonia)   Hyponatremia   Hypochloremia   Bilateral lower extremity edema   Discharge Condition: stable, much improved.  Diet recommendation: low salt diet, aspiration precautions.  Filed Weights   06/30/13 1556 07/01/13 0519 07/02/13 0545  Weight: 93.9 kg (207 lb 0.2 oz) 92.9 kg (204 lb 12.9 oz) 92.9 kg (204 lb 12.9 oz)    History of present illness at the time of admission:  Jennifer Nielsen is a 80 y.o. female who comes in unresponsive from [facility] Per family, she was SOB last week, they placed her on oxygen and she felt better until yesterday when she was sleepy and would not wake up. Facility was not sure how long she was unresponsive. There was question of flu as she is from a facility but family said she tested negative. A few weeks ago her legs began swelling. She was recently switched to xarelto from coumadin as well. She was recently started on levaquin as well.  In the ER, the sepsis protocol was implemented. Patient was given IVF and abx.  Upon exam, she has LE swelling, x ray shows fluid- in rapid a fib- suspect she has fluid overload and need duiresis, +/- abx.    Hospital Course:   Acute respiratory failure with  hypercapnia  - Suspect patient is a chronic CO2 retainer and developed CO2 narcosis in addition to CHF decompensation.  - Her respiratory status has greatly improved.   - She would benefit from CPAP qhs in addition to daily oxygen via nasal canula to help prevent CO2 narcosis. -Recommend a sleep study as outpatient  Acute diastolic heart failure  - Treated with IV Lasix initially, 8 L negative balance since admission, weight down to 92.9 KG his from 102.9 KG on admission  - Significant improvement, continue with Lasix 40 mg daily.  Will need close monitoring and cardiology follow up with Dr. Meda Coffee. - Echocardiogram on 06/26/13-EF 55-60%.   Atrial fibrillation  - Controlled with Cardizem and digoxin.  Cardizem dose has been adjusted down. - On Xarelto, cautiously continue-as patient will be discharged to SNF and will have supervision and assistance with mobility. - If in the future, she were to have frequent falls, this can then be discontinued.   History of venous thromboembolism  - Reviewed prior outpatient notes-patient has a history of having DVT in the past.  - Lower extremity Dopplers negative this admission  - On chronic Xarelto   Acute metabolic encephalopathy  - From CO2 narcosis-resolved.   Hyponatremia  - Suspected secondary to congestive heart failure  - Resolved with diuresis   Suspected history of COPD/OSA  - Will place on scheduled nebulized bronchodilators.   Gastroesophageal reflux disease  - Continue PPI   Dementia  - Review of the  outpatient notes, apparently mild.  - Continue Depakote, and olanzapine   Morbid obesity    Discharge Exam: Filed Vitals:   07/02/13 0545  BP: 109/59  Pulse: 84  Temp: 98.2 F (36.8 C)  Resp: 20   Gen Exam: Difficult to awaken at first, but now awake and alert  Neck: Supple, No JVD.  Chest: B/L Clear.  CVS: S1 S2 Regular, no murmurs.  Abdomen: obese, soft, BS +, non tender, non distended.  Extremities: 1+ edema,   Neurologic: Non Focal.  Skin: No Rash.     Discharge Instructions      Discharge Orders   Future Orders Complete By Expires   CPAP  As directed 07/02/2014   Comments:     CPAP QHS.  To be titrated by Respiratory therapy at [facility].   Diet - low sodium heart healthy  As directed    Increase activity slowly  As directed        Medication List    STOP taking these medications       albuterol (2.5 MG/3ML) 0.083% nebulizer solution  Commonly known as:  PROVENTIL     diltiazem 240 MG 24 hr capsule  Commonly known as:  DILACOR XR     levofloxacin 750 MG tablet  Commonly known as:  LEVAQUIN      TAKE these medications       benzonatate 100 MG capsule  Commonly known as:  TESSALON  Take 1 capsule (100 mg total) by mouth 3 (three) times daily as needed for cough.     CALTRATE 600+D PO  Take 1 tablet by mouth every morning.     digoxin 0.125 MG tablet  Commonly known as:  LANOXIN  Take 0.125 mg by mouth every Tuesday, Thursday, Saturday, and Sunday.     digoxin 0.25 MG tablet  Commonly known as:  LANOXIN  Take 0.25 mg by mouth every Monday, Wednesday, and Friday.     diltiazem 120 MG 24 hr capsule  Commonly known as:  CARDIZEM CD  Take 1 capsule (120 mg total) by mouth daily.     divalproex 125 MG capsule  Commonly known as:  DEPAKOTE SPRINKLE  Take 125 mg by mouth 2 (two) times daily.     DSS 100 MG Caps  Take 100 mg by mouth daily.     furosemide 40 MG tablet  Commonly known as:  LASIX  Take 40 mg by mouth daily.     guaiFENesin 600 MG 12 hr tablet  Commonly known as:  MUCINEX  Take 600 mg by mouth every 12 (twelve) hours. For 2 weeks; started on 06-22-13     guaiFENesin-dextromethorphan 100-10 MG/5ML syrup  Commonly known as:  ROBITUSSIN DM  Take 10 mLs by mouth 3 (three) times daily.     hydrOXYzine 25 MG tablet  Commonly known as:  ATARAX/VISTARIL  Take 25 mg by mouth every 8 (eight) hours as needed for itching.     levalbuterol 0.63 MG/3ML  nebulizer solution  Commonly known as:  XOPENEX  Take 3 mLs (0.63 mg total) by nebulization 3 (three) times daily.     OLANZapine 2.5 MG tablet  Commonly known as:  ZYPREXA  Take 2.5 mg by mouth every evening.     omeprazole 20 MG capsule  Commonly known as:  PRILOSEC  Take 20 mg by mouth daily before breakfast.     potassium chloride 10 MEQ tablet  Commonly known as:  K-DUR  Take 10 mEq by mouth daily.  saccharomyces boulardii 250 MG capsule  Commonly known as:  FLORASTOR  Take 250 mg by mouth 2 (two) times daily. For 7 days; ordered on 06-23-13     SYSTANE OP  Place 1 drop into both eyes 2 (two) times daily.     TYLENOL 500 MG tablet  Generic drug:  acetaminophen  Take 500 mg by mouth 3 (three) times daily.     XARELTO 20 MG Tabs tablet  Generic drug:  Rivaroxaban  Take 20 mg by mouth daily.       Allergies  Allergen Reactions  . Chocolate     unknown  . Ciprofloxacin     unknown  . Coffee Bean Extract [Coffea Arabica]     unknown  . Gentamycin [Gentamicin]     unknown  . Imodium [Loperamide]     unknown  . Penicillins     unknown  . Sulfa Antibiotics     unknown  . Tea     unknown   Follow-up Information   Follow up with Ena Dawley, H, MD. Schedule an appointment as soon as possible for a visit in 7 days.   Specialty:  Cardiology   Contact information:   [place] [address] [phone]        The results of significant diagnostics from this hospitalization (including imaging, microbiology, ancillary and laboratory) are listed below for reference.    Significant Diagnostic Studies: Dg Chest Port 1 View  06/25/2013   CLINICAL DATA:  Shortness of breath  EXAM: PORTABLE CHEST - 1 VIEW  COMPARISON:  None.  FINDINGS: 1123 hrs. The cardio pericardial silhouette is enlarged. There is pulmonary vascular congestion without overt pulmonary edema. Superimposed interstitial opacities suggest edema. Probable tiny  bilateral pleural effusions. Bones are diffusely demineralized. Telemetry leads overlie the chest.  IMPRESSION: Cardiomegaly with vascular congestion and interstitial edema.   Electronically Signed   By: Misty Stanley M.D.   On: 06/25/2013 12:09    Microbiology: Recent Results (from the past 240 hour(s))  URINE CULTURE     Status: None   Collection Time    06/25/13 11:24 AM      Result Value Range Status   Specimen Description URINE, RANDOM   Final   Special Requests NONE   Final   Culture  Setup Time     Final   Value: 06/25/2013 16:17     Performed at [place]     Final   Value: NO GROWTH     Performed at [facility]   Culture     Final   Value: NO GROWTH     Performed at [facility]   Report Status 06/26/2013 FINAL   Final  CULTURE, BLOOD (ROUTINE X 2)     Status: None   Collection Time    06/25/13 11:40 AM      Result Value Range Status   Specimen Description BLOOD RIGHT ANTECUBITAL   Final   Special Requests BOTTLES DRAWN AEROBIC ONLY 5MLS   Final   Culture  Setup Time     Final   Value: 06/25/2013 16:02     Performed at [facility]   Culture     Final   Value: STAPHYLOCOCCUS SPECIES (COAGULASE NEGATIVE)     Note: THE SIGNIFICANCE OF ISOLATING THIS ORGANISM FROM A SINGLE SET OF BLOOD CULTURES WHEN MULTIPLE SETS ARE DRAWN IS UNCERTAIN. PLEASE NOTIFY THE MICROBIOLOGY DEPARTMENT WITHIN ONE WEEK IF  SPECIATION AND SENSITIVITIES ARE REQUIRED.     Note: Gram Stain Report Called to,Read Back By and Verified With: STEPHANIE SHAW 06/26/13 1345 BY SMITHERSJ     Performed at [facility]   Report Status 06/27/2013 FINAL   Final  CULTURE, BLOOD (ROUTINE X 2)     Status: None   Collection Time    06/25/13 11:45 AM      Result Value Range Status   Specimen Description BLOOD RIGHT HAND   Final   Special Requests BOTTLES DRAWN AEROBIC ONLY 5MLS   Final   Culture  Setup Time     Final   Value: 06/25/2013 16:03     Performed  at [facility]   Culture     Final   Value: NO GROWTH 5 DAYS     Performed at [facility]   Report Status 07/01/2013 FINAL   Final  MRSA PCR SCREENING     Status: None   Collection Time    06/25/13  8:20 PM      Result Value Range Status   MRSA by PCR NEGATIVE  NEGATIVE Final   Comment:            The GeneXpert MRSA Assay (FDA     approved for NASAL specimens     only), is one component of a     comprehensive MRSA colonization     surveillance program. It is not     intended to diagnose MRSA     infection nor to guide or     monitor treatment for     MRSA infections.     Labs: Basic Metabolic Panel:  Recent Labs Lab 06/27/13 0333 06/28/13 0338 06/29/13 0850 06/30/13 0320 07/02/13 0519  NA 127* 134* 138 136* 144  K 3.8 5.1 3.6* 3.8 3.4*  CL 78* 80* 84* 83* 85*  CO2 43* 45* 44* >45* >45*  GLUCOSE 79 68* 85 90 96  BUN 10 11 11 12 11   CREATININE 0.39* 0.56 0.44* 0.46* 0.60  CALCIUM 8.7 8.6 8.8 8.6 8.4   Liver Function Tests:  Recent Labs Lab 06/26/13 0235  AST 38*  ALT 16  ALKPHOS 62  BILITOT 0.5  PROT 6.1  ALBUMIN 2.4*   CBC:  Recent Labs Lab 06/26/13 0235 06/27/13 0333 06/28/13 0338 06/30/13 0320  WBC 6.7 8.4 6.9 7.9  HGB 12.3 12.8 15.5* 12.8  HCT 38.8 40.0 47.1* 41.9  MCV 93.0 92.0 93.1 96.1  PLT 308 315 218 341   Cardiac Enzymes:  Recent Labs Lab 06/25/13 2115 06/26/13 0235 06/26/13 0840  TROPONINI <0.30 <0.30 <0.30   BNP: BNP (last 3 results)  Recent Labs  06/25/13 1337  PROBNP 3381.0*      Signed:  Karen Kitchens [phone]  [facility] 07/02/2013, 11:39 AM  Attending Patient seen and examined, the with the above assessment and plan. Completely awake and alert, breathing well. Suspect she is stable for discharge. Suspect she will need outpatient sleep study. Rest as above.  Nena Alexander MD

## 2013-07-04 ENCOUNTER — Non-Acute Institutional Stay (SKILLED_NURSING_FACILITY): Payer: Medicare Other | Admitting: Internal Medicine

## 2013-07-04 DIAGNOSIS — F0390 Unspecified dementia without behavioral disturbance: Secondary | ICD-10-CM | POA: Diagnosis not present

## 2013-07-04 DIAGNOSIS — I509 Heart failure, unspecified: Secondary | ICD-10-CM | POA: Insufficient documentation

## 2013-07-04 DIAGNOSIS — I4891 Unspecified atrial fibrillation: Secondary | ICD-10-CM | POA: Diagnosis not present

## 2013-07-04 DIAGNOSIS — I80299 Phlebitis and thrombophlebitis of other deep vessels of unspecified lower extremity: Secondary | ICD-10-CM | POA: Insufficient documentation

## 2013-07-04 NOTE — Progress Notes (Signed)
HISTORY & PHYSICAL  DATE: 07/04/2013   FACILITY: [facility]  LEVEL OF CARE: SNF (31)  ALLERGIES:  Allergies  Allergen Reactions  . Chocolate     unknown  . Ciprofloxacin     unknown  . Coffee Bean Extract [Coffea Arabica]     unknown  . Gentamycin [Gentamicin]     unknown  . Imodium [Loperamide]     unknown  . Penicillins     unknown  . Sulfa Antibiotics     unknown  . Tea     unknown    CHIEF COMPLAINT:  Manage CHF, atrial fibrillation and dementia  HISTORY OF PRESENT ILLNESS: Patient is an 80 year old Caucasian female who was hospitalized secondary to altered mental status. After hospitalization she is readmitted back to the facility for long-term care management.  CHF:The patient does not relate significant weight changes, denies sob, DOE, orthopnea, PNDs, pedal edema, palpitations or chest pain.  CHF remains stable.  No complications form the medications being used. EF 55-60%.  ATRIAL FIBRILLATION: the patients atrial fibrillation remains stable.  The patient denies DOE, tachycardia, orthopnea, transient neurological sx, pedal edema, palpitations, & PNDs.  No complications noted from the medications currently being used.  DEMENTIA: The dementia remaines stable and continues to function adequately in the current living environment with supervision.  The patient has had little changes in behavior. No complications noted from the medications presently being used.  PAST MEDICAL HISTORY :  Past Medical History  Diagnosis Date  . Arrhythmia   . Arthritis   . Atrial fibrillation     Remains stable (As of 05/09/13)  . H/O blood clots   . Hyperlipemia   . Dementia     Remains stable & continues to function adequately in the current living environment with supervision. Has had little changes in behavior (AS OF 05/09/13)  . GERD (gastroesophageal reflux disease)     Stable (As of 05/09/13)  . Osteoarthritis     Denies pain (As of  05/09/13)    PAST SURGICAL HISTORY: Past Surgical History  Procedure Laterality Date  . Appendectomy    . Gallbladder surgery    . Hip surgery      Right-Metal plate   . Leg surgery      Left leg    SOCIAL HISTORY:  reports that she has quit smoking. She has never used smokeless tobacco. She reports that she does not drink alcohol or use illicit drugs.  FAMILY HISTORY:  Family History  Problem Relation Age of Onset  . Colon cancer Neg Hx   . Heart disease Father   . Breast cancer Daughter     CURRENT MEDICATIONS: Reviewed per [facility]  REVIEW OF SYSTEMS:  See HPI otherwise 14 point ROS is negative.  PHYSICAL EXAMINATION  VS:  T 98.1        P 80       RR 24       BP 122/59       POX% 95       WT (Lb) 204  GENERAL: no acute distress,  obese body habitus EYES: conjunctivae normal, sclerae normal, normal eye lids MOUTH/THROAT: lips without lesions,no lesions in the mouth,tongue is without lesions,uvula elevates in midline NECK: supple, trachea midline, no neck masses, no thyroid tenderness, no thyromegaly LYMPHATICS: no LAN in the neck, no supraclavicular LAN RESPIRATORY: breathing is even & unlabored, BS CTAB CARDIAC: Heart rate is irregularly irregular, no murmur,no extra  heart sounds, no edema GI:  ABDOMEN: abdomen soft, normal BS, no masses, no tenderness  LIVER/SPLEEN: no hepatomegaly, no splenomegaly MUSCULOSKELETAL: HEAD: normal to inspection & palpation BACK: no kyphosis, scoliosis or spinal processes tenderness EXTREMITIES: LEFT UPPER EXTREMITY:  range of motion unable to perform, decreased strength & tone RIGHT UPPER EXTREMITY:   range of motion unable to perform, normal strength & tone LEFT LOWER EXTREMITY:   range of motion unable to perform, decreased strength & tone RIGHT LOWER EXTREMITY:  range of motion unable to perform, decreased strength & tone PSYCHIATRIC: the patient is alert & oriented to person, affect & behavior appropriate  LABS/RADIOLOGY:  Labs  reviewed: Basic Metabolic Panel:  Recent Labs  06/29/13 0850 06/30/13 0320 07/02/13 0519  NA 138 136* 144  K 3.6* 3.8 3.4*  CL 84* 83* 85*  CO2 44* >45* >45*  GLUCOSE 85 90 96  BUN 11 12 11   CREATININE 0.44* 0.46* 0.60  CALCIUM 8.8 8.6 8.4   Liver Function Tests:  Recent Labs  06/18/13 1229 06/25/13 1135 06/26/13 0235  AST 36 22 38*  ALT 32 19 16  ALKPHOS 74 87 62  BILITOT 0.5 0.5 0.5  PROT 6.8 7.3 6.1  ALBUMIN 3.0* 3.0* 2.4*   CBC:  Recent Labs  06/25/13 1135  06/27/13 0333 06/28/13 0338 06/30/13 0320  WBC 8.0  < > 8.4 6.9 7.9  NEUTROABS 5.9  --   --   --   --   HGB 13.7  < > 12.8 15.5* 12.8  HCT 41.3  < > 40.0 47.1* 41.9  MCV 90.2  < > 92.0 93.1 96.1  PLT 357  < > 315 218 341  < > = values in this interval not displayed.  Cardiac Enzymes:  Recent Labs  06/25/13 2115 06/26/13 0235 06/26/13 0840  TROPONINI <0.30 <0.30 <0.30    Urine cx-no growth Blood culture x2-no growth  Transthoracic Echocardiography  Patient:    Jennifer Nielsen, Jennifer Nielsen MR #:       [id number] Study Date: 06/26/2013 Gender:     F Age:        35 Height:     160cm Weight:     102.7kg BSA:        2.61m^2 Pt. Status: Room:       D31C    ATTENDING    Debbe Odea  SONOGRAPHER  Tresa Res, RDCS  ADMITTING    Pearla Dubonnet, Robb Matar  PERFORMING   Chmg, Inpatient cc:  ------------------------------------------------------------ LV EF: 55% -   60%  ------------------------------------------------------------ Indications:      Dyspnea 786.09.  ------------------------------------------------------------ History:   PMH:   Atrial fibrillation.  Congestive heart failure.  Risk factors:  Former tobacco use.  ------------------------------------------------------------ Study Conclusions  - Left ventricle: The cavity size was normal. Wall thickness   was increased in a pattern of mild LVH. Systolic function   was normal. The estimated  ejection fraction was in the   range of 55% to 60%. Wall motion was normal; there were no   regional wall motion abnormalities. - Aortic valve: Trivial regurgitation. - Left atrium: The atrium was mildly to moderately dilated. - Right ventricle: The cavity size was mildly dilated. - Right atrium: The atrium was mildly dilated. - Pulmonary arteries: PA peak pressure: 54mm Hg (S). Impressions:  - No prior study for comparison. Transthoracic echocardiography.  M-mode, complete 2D, spectral Doppler, and color Doppler.  Height:  Height: 160cm. Height: 63in.  Weight:  Weight: 102.7kg. Weight: 226lb.  Body mass index:  BMI: 40.1kg/m^2.  Body surface area:    BSA: 2.52m^2.  Blood pressure:     102/65.  Patient status:  Inpatient.  Location:  ICU/CCU  ------------------------------------------------------------  ------------------------------------------------------------ Left ventricle:  The cavity size was normal. Wall thickness was increased in a pattern of mild LVH. Systolic function was normal. The estimated ejection fraction was in the range of 55% to 60%. Wall motion was normal; there were no regional wall motion abnormalities. The study was not technically sufficient to allow evaluation of LV diastolic dysfunction due to atrial fibrillation.  ------------------------------------------------------------ Aortic valve:   Trileaflet; normal thickness leaflets. Mobility was not restricted.  Doppler:  Transvalvular velocity was within the normal range. There was no stenosis.  Trivial regurgitation.  ------------------------------------------------------------ Aorta:  Aortic root: The aortic root was normal in size.  ------------------------------------------------------------ Mitral valve:   Structurally normal valve.   Mobility was not restricted.  Doppler:  Transvalvular velocity was within the normal range. There was no evidence for stenosis.  No regurgitation.    Peak gradient:  36mm Hg (D).  ------------------------------------------------------------ Left atrium:  The atrium was mildly to moderately dilated.   ------------------------------------------------------------ Right ventricle:  The cavity size was mildly dilated. Systolic function was normal.  ------------------------------------------------------------ Pulmonic valve:    Doppler:  Transvalvular velocity was within the normal range. There was no evidence for stenosis.   ------------------------------------------------------------ Tricuspid valve:   Structurally normal valve.    Doppler: Transvalvular velocity was within the normal range.  Trivial regurgitation.  ------------------------------------------------------------ Pulmonary artery:   Systolic pressure was within the normal range.  ------------------------------------------------------------ Right atrium:  The atrium was mildly dilated.  ------------------------------------------------------------ Pericardium:  There was no pericardial effusion.  ------------------------------------------------------------  2D measurements        Normal  Doppler               Normal Left ventricle                 measurements LVID ED,   43.6 mm     43-52   Main pulmonary chord,                         artery PLAX                           Pressure, S    41 mm  =30 LVID ES,   32.5 mm     23-38                     Hg chord,                         Left ventricle PLAX                           Ea, lat      10.6 cm/ ------- FS, chord,   25 %      >29     ann, tiss         s PLAX                           DP LVPW, ED   11.8 mm     ------  E/Ea, lat    9.43     -------  IVS/LVPW   0.94        <1.3    ann, tiss ratio, ED                      DP Ventricular septum             Ea, med      8.34 cm/ ------- IVS, ED    11.1 mm     ------  ann, tiss         s Aorta                          DP Root diam,   28 mm     ------  E/Ea, med   11.99     ------- ED                              ann, tiss Left atrium                    DP AP dim       46 mm     ------  Mitral valve AP dim      2.1 cm/m^2 <2.2    Peak E vel    100 cm/ ------- index                                            s Right ventricle                Deceleratio   189 ms  150-230 RVID ED,   47.8 mm     19-38   n time PLAX                           Peak            4 mm  -------                                gradient, D       Hg                                Tricuspid valve                                Regurg peak   280 cm/ -------                                vel               s                                Peak RV-RA     31 mm  -------                                gradient, S       Hg  Max regurg    280 cm/ -------                                vel               s                                Systemic veins                                Estimated      10 mm  -------                                CVP               Hg                                Right ventricle                                Pressure, S    41 mm  <30                                                  Hg Bilateral Lower Extremity Venous Duplex Evaluation  Patient:    Jennifer Nielsen, Jennifer Nielsen MR #:       [id number] Study Date: 06/27/2013 Gender:     F Age:        67 Height: Weight: BSA: Pt. Status: Room:       2C02C    ATTENDING    Clarise Cruz  SONOGRAPHER  Landry Mellow, RVT, RDMS  ADMITTING    Eulogio Bear Reports also to:  ------------------------------------------------------------ History and indications:  Indications  782.3 Edema.  History  Diagnostic evaluation.  ------------------------------------------------------------ Study information:  Study status:  Routine.  Procedure:  A vascular evaluation was performed with the patient in the supine position. The right common femoral, right  femoral, right profunda femoral, right popliteal, right peroneal, right posterior tibial, left common femoral, left femoral, left profunda femoral, left popliteal, left peroneal, and left posterior tibial veins were studied. Image quality was poor. The study was technically limited due to body habitus.    Bilateral lower extremity venous duplex evaluation.     Doppler flow study including B-mode compression maneuvers of all visualized segments, color flow Doppler and selected views of pulsed wave Doppler.  Location:  Vascular laboratory.  Patient status:  Inpatient.  Venous flow:  +---------------------+----------------+-------------------+ Location             Overall         Flow properties     +---------------------+----------------+-------------------+ Right common femoral Patent          Phasic;  spontaneous;                                             compressible        +---------------------+----------------+-------------------+ Right femoral        Patent          Compressible        +---------------------+----------------+-------------------+ Right profunda       Patent          Compressible        femoral                                                  +---------------------+----------------+-------------------+ Right popliteal      Patent          Phasic;                                                  spontaneous;                                             compressible        +---------------------+----------------+-------------------+ Right posterior      Not all segmentsCompressible        tibial               well visualized                     +---------------------+----------------+-------------------+ Right peroneal       Not all segmentsCompressible                             well visualized                      +---------------------+----------------+-------------------+ Right saphenofemoral Patent          Compressible        junction                                                 +---------------------+----------------+-------------------+ Left common femoral  Patent          Phasic;                                                  spontaneous;                                             compressible        +---------------------+----------------+-------------------+ Left femoral         Patent  Compressible        +---------------------+----------------+-------------------+ Left profunda femoralPatent          Compressible        +---------------------+----------------+-------------------+ Left popliteal       Patent          Phasic;                                                  spontaneous;                                             compressible        +---------------------+----------------+-------------------+ Left posterior tibialNot all segmentsCompressible                             well visualized                     +---------------------+----------------+-------------------+ Left peroneal        Not all segmentsCompressible                             well visualized                     +---------------------+----------------+-------------------+ Left saphenofemoral  Patent          Compressible        junction                                                 +---------------------+----------------+-------------------+  ------------------------------------------------------------ Summary:  - No evidence of deep vein thrombosis involving the   visualized veins of the right lower extremity. - No evidence of deep vein thrombosis involving the   visualized veins of the left lower extremity. - No evidence of Baker's cyst on the right or left. PORTABLE CHEST - 1 VIEW   COMPARISON:  None.    FINDINGS: 1123 hrs. The cardio pericardial silhouette is enlarged. There is pulmonary vascular congestion without overt pulmonary edema. Superimposed interstitial opacities suggest edema. Probable tiny bilateral pleural effusions. Bones are diffusely demineralized. Telemetry leads overlie the chest.   IMPRESSION: Cardiomegaly with vascular congestion and interstitial edema.   ASSESSMENT/PLAN:  CHF-well compensated Atrial fibrillation-rate controlled Dementia-stable History of DVT-on xarelto Hypokalemia-continue supplementation. Recheck GERD-stable Check BMP  I have reviewed patient's medical records received at admission/from hospitalization.  CPT CODE: 13086

## 2013-07-05 ENCOUNTER — Ambulatory Visit: Payer: Medicare Other | Admitting: Cardiology

## 2013-07-12 ENCOUNTER — Encounter: Payer: Self-pay | Admitting: Cardiology

## 2013-07-12 ENCOUNTER — Ambulatory Visit (INDEPENDENT_AMBULATORY_CARE_PROVIDER_SITE_OTHER): Payer: Medicare Other | Admitting: Cardiology

## 2013-07-12 VITALS — BP 112/80 | HR 76 | Ht 65.0 in | Wt 201.0 lb

## 2013-07-12 DIAGNOSIS — I4891 Unspecified atrial fibrillation: Secondary | ICD-10-CM | POA: Diagnosis not present

## 2013-07-12 LAB — COMPREHENSIVE METABOLIC PANEL
ALT: 20 U/L (ref 0–35)
AST: 22 U/L (ref 0–37)
Albumin: 3.2 g/dL — ABNORMAL LOW (ref 3.5–5.2)
Alkaline Phosphatase: 80 U/L (ref 39–117)
BUN: 16 mg/dL (ref 6–23)
CO2: 38 mEq/L — ABNORMAL HIGH (ref 19–32)
Calcium: 9 mg/dL (ref 8.4–10.5)
Chloride: 96 mEq/L (ref 96–112)
Creatinine, Ser: 0.6 mg/dL (ref 0.4–1.2)
GFR: 95 mL/min (ref >60.00)
Glucose, Bld: 79 mg/dL (ref 70–99)
Potassium: 4.2 mEq/L (ref 3.5–5.1)
Sodium: 140 mEq/L (ref 135–145)
Total Bilirubin: 0.7 mg/dL (ref 0.3–1.2)
Total Protein: 7 g/dL (ref 6.0–8.3)

## 2013-07-12 NOTE — Patient Instructions (Signed)
**Note De-Identified  Obfuscation** Your physician recommends that you return for lab work in: today  Your physician recommends that you schedule a follow-up appointment in: March 24 @ 9:45

## 2013-07-12 NOTE — Progress Notes (Signed)
Patient ID: Aliea Bobe, female   DOB: 10/06/1924, 80 y.o.   MRN: [id number]     Patient Name: Jennifer Nielsen Date of Encounter: 07/12/2013  Primary Care Provider:  [name], MD Primary Cardiologist:  Ena Dawley, H   Problem List   Past Medical History  Diagnosis Date  . Arrhythmia   . Arthritis   . Atrial fibrillation     Remains stable (As of 05/09/13)  . H/O blood clots   . Hyperlipemia   . Dementia     Remains stable & continues to function adequately in the current living environment with supervision. Has had little changes in behavior (AS OF 05/09/13)  . GERD (gastroesophageal reflux disease)     Stable (As of 05/09/13)  . Osteoarthritis     Denies pain (As of 05/09/13)   Past Surgical History  Procedure Laterality Date  . Appendectomy    . Gallbladder surgery    . Hip surgery      Right-Metal plate   . Leg surgery      Left leg    Allergies  Allergies  Allergen Reactions  . Chocolate     unknown  . Ciprofloxacin     unknown  . Coffee Bean Extract [Coffea Arabica]     unknown  . Gentamycin [Gentamicin]     unknown  . Imodium [Loperamide]     unknown  . Penicillins     unknown  . Sulfa Antibiotics     unknown  . Tea     unknown    HPI  A 80 year old female with h/o very mild dementia and chronic atrial fibrillation on chronic anticoagulation that has been managed by PCP. The patient has been rate controlled with diltiazem and digoxin. She has been very active taking care of herself until 2 years ago when she broke her hip and has been in wheelchair since then. She denies any falls. She is coming for a opinion on new anticoagulants as she has been tired of diet restrictions with coumadine. She denies any prior bleeding events. She denies any prior chest pain or palpitations. Since she made this appointment she started to develop lower extremity edema. This started about 2-4 days ago. She admit to a "stable SOB" that she attributes to COPD. She  stopped smoking 50 years ago.  The patient is coming after 3 weeks, and states that her LE edema has improved. She denies resting SOB or CP. No orthopnea or PND. She doesn't walk and lives in a nursing home.  Home Medications  Prior to Admission medications   Medication Sig Start Date End Date Taking? Authorizing Provider  acetaminophen (TYLENOL) 500 MG tablet Take 500 mg by mouth every 6 (six) hours as needed.   Yes Historical Provider, MD  Calcium Carbonate-Vitamin D (CALTRATE 600+D PO) Take by mouth daily.   Yes Historical Provider, MD  digoxin (LANOXIN) 0.25 MG tablet Take by mouth daily.    Yes Historical Provider, MD  diltiazem (DILACOR XR) 240 MG 24 hr capsule Take 240 mg by mouth daily.   Yes Historical Provider, MD  divalproex (DEPAKOTE) 125 MG DR tablet Take 125 mg by mouth 2 (two) times daily.   Yes Historical Provider, MD  omeprazole (PRILOSEC) 20 MG capsule Take 20 mg by mouth daily.   Yes Historical Provider, MD  Protein (PROCEL) POWD Take by mouth as directed.   Yes Historical Provider, MD  risperiDONE (RISPERDAL) 0.25 MG tablet Take 0.25 mg by mouth 2 (two) times daily.  Yes Historical Provider, MD  warfarin (COUMADIN) 2.5 MG tablet Take 1 tablet (2.5 mg total) by mouth daily. 09/05/12  Yes Monina C Medina-Vargas, NP    Family History  Family History  Problem Relation Age of Onset  . Colon cancer Neg Hx   . Heart disease Father   . Breast cancer Daughter     Social History  History   Social History  . Marital Status: Single    Spouse Name: N/A    Number of Children: 2  . Years of Education: N/A   Occupational History  . Retired     Pharmacist, hospital   Social History Main Topics  . Smoking status: Former Research scientist (life sciences)  . Smokeless tobacco: Never Used  . Alcohol Use: No  . Drug Use: No  . Sexual Activity: Not on file   Other Topics Concern  . Not on file   Social History Narrative   No caffeine      Review of Systems, as per HPI, otherwise negative General:  No  chills, fever, night sweats or weight changes.  Cardiovascular:  No chest pain, dyspnea on exertion, edema, orthopnea, palpitations, paroxysmal nocturnal dyspnea. Dermatological: No rash, lesions/masses Respiratory: No cough, dyspnea Urologic: No hematuria, dysuria Abdominal:   No nausea, vomiting, diarrhea, bright red blood per rectum, melena, or hematemesis Neurologic:  No visual changes, wkns, changes in mental status. All other systems reviewed and are otherwise negative except as noted above.  Physical Exam BP 136/74, HR 77 BPM Height 5\' 5"  (1.651 m).  General: Pleasant, NAD Psych: Normal affect. Neuro: Alert and oriented X 3. Moves all extremities spontaneously. HEENT: Normal  Neck: Supple without bruits or JVD. Lungs:  Resp regular and unlabored, crackles and wheezes. Heart: RRR no s3, s4, or murmurs. Abdomen: Soft, non-tender, non-distended, BS + x 4.  Extremities: No clubbing, cyanosis, mild pitting edema up to the mid thighs B/L. DP/PT/Radials 2+ and equal bilaterally.  Labs:  No results found for this basename: CKTOTAL, CKMB, TROPONINI,  in the last 72 hours Lab Results  Component Value Date   WBC 7.9 06/30/2013   HGB 12.8 06/30/2013   HCT 41.9 06/30/2013   MCV 96.1 06/30/2013   PLT 341 06/30/2013   Accessory Clinical Findings  Echocardiogram - 06/26/2013 - Left ventricle: The cavity size was normal. Wall thickness was increased in a pattern of mild LVH. Systolic function was normal. The estimated ejection fraction was in the range of 55% to 60%. Wall motion was normal; there were no regional wall motion abnormalities. - Aortic valve: Trivial regurgitation. - Left atrium: The atrium was mildly to moderately dilated. - Right ventricle: The cavity size was mildly dilated. - Right atrium: The atrium was mildly dilated. - Pulmonary arteries: PA peak pressure: 14mm Hg (S). Impressions:  - No prior study for comparison.  ECG - atrial fibrillation,  LPFB    Assessment & Plan  80 year old female   1. Chronic atrial fibrillation - rate controlled with diltiazem and digoxin. We switched to Xarelto 20 mg PO daily, she has normal kidney function and weighs 225 lbs. A-fib is rate controlled. Low fall risk as she doesn't walk.  2. New onset CHF, significant fluid overload - improved on Lasix 40 mg po, we will continue and check BMP today. Echo showed preserved LVEF, mild pulmonary hhypertension.   Follow up in 2 months.    Ena Dawley, Lemmie Evens, MD, [facility] 07/12/2013, 11:30 AM

## 2013-07-16 DIAGNOSIS — F02818 Dementia in other diseases classified elsewhere, unspecified severity, with other behavioral disturbance: Secondary | ICD-10-CM | POA: Diagnosis not present

## 2013-07-16 DIAGNOSIS — F22 Delusional disorders: Secondary | ICD-10-CM | POA: Diagnosis not present

## 2013-07-16 DIAGNOSIS — F0281 Dementia in other diseases classified elsewhere with behavioral disturbance: Secondary | ICD-10-CM | POA: Diagnosis not present

## 2013-07-20 DIAGNOSIS — F3289 Other specified depressive episodes: Secondary | ICD-10-CM | POA: Diagnosis not present

## 2013-07-20 DIAGNOSIS — F29 Unspecified psychosis not due to a substance or known physiological condition: Secondary | ICD-10-CM | POA: Diagnosis not present

## 2013-07-20 DIAGNOSIS — F329 Major depressive disorder, single episode, unspecified: Secondary | ICD-10-CM | POA: Diagnosis not present

## 2013-08-03 DIAGNOSIS — F29 Unspecified psychosis not due to a substance or known physiological condition: Secondary | ICD-10-CM | POA: Diagnosis not present

## 2013-08-03 DIAGNOSIS — F3289 Other specified depressive episodes: Secondary | ICD-10-CM | POA: Diagnosis not present

## 2013-08-03 DIAGNOSIS — F329 Major depressive disorder, single episode, unspecified: Secondary | ICD-10-CM | POA: Diagnosis not present

## 2013-08-14 DIAGNOSIS — F3289 Other specified depressive episodes: Secondary | ICD-10-CM | POA: Diagnosis not present

## 2013-08-14 DIAGNOSIS — F329 Major depressive disorder, single episode, unspecified: Secondary | ICD-10-CM | POA: Diagnosis not present

## 2013-08-14 DIAGNOSIS — F29 Unspecified psychosis not due to a substance or known physiological condition: Secondary | ICD-10-CM | POA: Diagnosis not present

## 2013-08-17 ENCOUNTER — Encounter: Payer: Self-pay | Admitting: *Deleted

## 2013-08-27 DIAGNOSIS — F02818 Dementia in other diseases classified elsewhere, unspecified severity, with other behavioral disturbance: Secondary | ICD-10-CM | POA: Diagnosis not present

## 2013-08-27 DIAGNOSIS — F22 Delusional disorders: Secondary | ICD-10-CM | POA: Diagnosis not present

## 2013-08-27 DIAGNOSIS — F0281 Dementia in other diseases classified elsewhere with behavioral disturbance: Secondary | ICD-10-CM | POA: Diagnosis not present

## 2013-08-29 DIAGNOSIS — E039 Hypothyroidism, unspecified: Secondary | ICD-10-CM | POA: Diagnosis not present

## 2013-08-29 DIAGNOSIS — I10 Essential (primary) hypertension: Secondary | ICD-10-CM | POA: Diagnosis not present

## 2013-08-30 ENCOUNTER — Non-Acute Institutional Stay (SKILLED_NURSING_FACILITY): Payer: Medicare Other | Admitting: Adult Health

## 2013-08-30 ENCOUNTER — Encounter: Payer: Self-pay | Admitting: Adult Health

## 2013-08-30 DIAGNOSIS — K219 Gastro-esophageal reflux disease without esophagitis: Secondary | ICD-10-CM

## 2013-08-30 DIAGNOSIS — F29 Unspecified psychosis not due to a substance or known physiological condition: Secondary | ICD-10-CM | POA: Diagnosis not present

## 2013-08-30 DIAGNOSIS — I4891 Unspecified atrial fibrillation: Secondary | ICD-10-CM

## 2013-08-30 DIAGNOSIS — K5900 Constipation, unspecified: Secondary | ICD-10-CM

## 2013-08-30 DIAGNOSIS — F0390 Unspecified dementia without behavioral disturbance: Secondary | ICD-10-CM | POA: Insufficient documentation

## 2013-08-30 DIAGNOSIS — I509 Heart failure, unspecified: Secondary | ICD-10-CM | POA: Diagnosis not present

## 2013-08-30 NOTE — Progress Notes (Signed)
Patient ID: Jennifer Nielsen, female   DOB: 24-Feb-1925, 80 y.o.   MRN: [id number]         PROGRESS NOTE  DATE: 08/30/13  FACILITY: [facility]  LEVEL OF CARE: SNF  Routine Visit  CHIEF COMPLAINT:  Manage atrial fibrillation, CHF & dementia  HISTORY OF PRESENT ILLNESS:  REASSESSMENT OF ONGOING PROBLEMS:  ATRIAL FIBRILLATION: the patients a-fib remains stable.  The patient denies DOE, tachycardia, orthopnea, transient neurological sx, pedal edema, palpitations, & PNDs.  No complications noted from the medications currently being used.  CONSTIPATION: The constipation remains stable. No complications from the medications presently being used. Patient denies ongoing constipation, abdominal pain, nausea or vomiting.  GERD: pt's GERD is stable.  Denies ongoing heartburn, abd. Pain, nausea or vomiting.  Currently on a PPI & tolerates it without any adverse reactions.   PAST MEDICAL HISTORY : Reviewed.  No changes.  CURRENT MEDICATIONS: Reviewed per [facility]  REVIEW OF SYSTEMS:  GENERAL: no change in appetite, no fatigue, no weight changes, no fever, chills or weakness RESPIRATORY: no cough, SOB, DOE,, wheezing, hemoptysis CARDIAC: no chest pain, or palpitations, + edema GI: no abdominal pain, diarrhea, constipation, heart burn, nausea or vomiting  PHYSICAL EXAMINATION  GENERAL: no acute distress, moderately obese body habitus NECK: supple, trachea midline, no neck masses, no thyroid tenderness, no thyromegaly LYMPHATIC: No adenopathy in the cervical, supraclavicular and axillary areas RESPIRATORY: breathing is even & unlabored, BS CTAB CARDIAC: RRR, no murmur,no extra heart sounds, BLE edema 1+ GI: abdomen soft, normal BS, no masses, no tenderness, no hepatomegaly, no splenomegaly PSYCHIATRIC: the patient is alert & oriented to person, affect & behavior appropriate  LABS/RADIOLOGY: 07/09/13 sodium 140 potassium 4.1 glucose 80 BUN 14 creatinine 0.6 calcium 8.7 07/04/13 sodium 142 potassium  3.4 glucose 105 BUN 10 creatinine 0.4 calcium 8.7 06/23/13 sodium 118 potassium 5.5 glucose 98 BUN 16 creatinine 0.4 calcium 8.5 8-14 CO2 33, glucose 137, albumin 3.4 otherwise CMP normal, CBC normal 6/14 digoxin level 0.9, hemoglobin A1c 5.6, Depakote level 40.4 2/14 CBC normal, glucose 100, total protein 5.8 otherwise CMP normal, digoxin level 0.5 11/13 Depakote level 65.7  ASSESSMENT/PLAN:  Diastolic Heart Failure - stable; continue Lasix atrial fibrillation-rate controlled. dementia-stable. Psychosis - stable; continue Zyprexa GERD-stable; continue Prilosec Constipation - no complaints   CPT CODE: 40981   Seth Bake - NP [facility] [phone]

## 2013-09-02 DIAGNOSIS — F3289 Other specified depressive episodes: Secondary | ICD-10-CM | POA: Diagnosis not present

## 2013-09-02 DIAGNOSIS — F29 Unspecified psychosis not due to a substance or known physiological condition: Secondary | ICD-10-CM | POA: Diagnosis not present

## 2013-09-02 DIAGNOSIS — F329 Major depressive disorder, single episode, unspecified: Secondary | ICD-10-CM | POA: Diagnosis not present

## 2013-09-04 ENCOUNTER — Encounter: Payer: Self-pay | Admitting: Cardiology

## 2013-09-04 ENCOUNTER — Ambulatory Visit (INDEPENDENT_AMBULATORY_CARE_PROVIDER_SITE_OTHER): Payer: Medicare Other | Admitting: Cardiology

## 2013-09-04 VITALS — BP 140/80 | HR 96

## 2013-09-04 DIAGNOSIS — I509 Heart failure, unspecified: Secondary | ICD-10-CM | POA: Diagnosis not present

## 2013-09-04 LAB — BASIC METABOLIC PANEL
BUN: 17 mg/dL (ref 6–23)
CO2: 35 mEq/L — ABNORMAL HIGH (ref 19–32)
Calcium: 9.4 mg/dL (ref 8.4–10.5)
Chloride: 93 mEq/L — ABNORMAL LOW (ref 96–112)
Creatinine, Ser: 0.7 mg/dL (ref 0.4–1.2)
GFR: 84.09 mL/min (ref >60.00)
Glucose, Bld: 84 mg/dL (ref 70–99)
Potassium: 4.6 mEq/L (ref 3.5–5.1)
Sodium: 136 mEq/L (ref 135–145)

## 2013-09-04 NOTE — Progress Notes (Signed)
Patient ID: Jennifer Nielsen, female   DOB: 02-21-1925, 80 y.o.   MRN: [id number]    Patient Name: Jennifer Nielsen Date of Encounter: 09/04/2013  Primary Care Provider:  [name], MD Primary Cardiologist:  Dorothy Spark   Problem List   Past Medical History  Diagnosis Date  . Arrhythmia   . Arthritis   . Atrial fibrillation     Remains stable (As of 05/09/13)  . H/O blood clots   . Hyperlipemia   . Dementia     Remains stable & continues to function adequately in the current living environment with supervision. Has had little changes in behavior (AS OF 05/09/13)  . GERD (gastroesophageal reflux disease)     Stable (As of 05/09/13)  . Osteoarthritis     Denies pain (As of 05/09/13)   Past Surgical History  Procedure Laterality Date  . Appendectomy    . Gallbladder surgery    . Hip surgery      Right-Metal plate   . Leg surgery      Left leg    Allergies  Allergies  Allergen Reactions  . Chocolate     unknown  . Ciprofloxacin     unknown  . Coffee Bean Extract [Coffea Arabica]     unknown  . Gentamycin [Gentamicin]     unknown  . Imodium [Loperamide]     unknown  . Penicillins     unknown  . Sulfa Antibiotics     unknown  . Tea     unknown    HPI  A 80 year old female with h/o very mild dementia and chronic atrial fibrillation on chronic anticoagulation that has been managed by PCP. The patient has been rate controlled with diltiazem and digoxin. She has been very active taking care of herself until 2 years ago when she broke her hip and has been in wheelchair since then. She denies any falls. She is coming for a opinion on new anticoagulants as she has been tired of diet restrictions with coumadine. She denies any prior bleeding events. She denies any prior chest pain or palpitations. Since she made this appointment she started to develop lower extremity edema. This started about 2-4 days ago. She admit to a "stable SOB" that she attributes to COPD. She  stopped smoking 50 years ago.  The patient is coming after 3 weeks, and states that her LE edema has improved. She denies resting SOB or CP. No orthopnea or PND. She doesn't walk and lives in a nursing home.  The patient is coming back after 6 weeks and has no complaints, she denies chest pain, shortness of breath or lower extremity edema. She has no palpitations or syncope. She denies any falls. She has noticed to have any bruising after starting Xarelto.  Home Medications  Prior to Admission medications   Medication Sig Start Date End Date Taking? Authorizing Provider  acetaminophen (TYLENOL) 500 MG tablet Take 500 mg by mouth every 6 (six) hours as needed.   Yes Historical Provider, MD  Calcium Carbonate-Vitamin D (CALTRATE 600+D PO) Take by mouth daily.   Yes Historical Provider, MD  digoxin (LANOXIN) 0.25 MG tablet Take by mouth daily.    Yes Historical Provider, MD  diltiazem (DILACOR XR) 240 MG 24 hr capsule Take 240 mg by mouth daily.   Yes Historical Provider, MD  divalproex (DEPAKOTE) 125 MG DR tablet Take 125 mg by mouth 2 (two) times daily.   Yes Historical Provider, MD  omeprazole (PRILOSEC) 20 MG  capsule Take 20 mg by mouth daily.   Yes Historical Provider, MD  Protein (PROCEL) POWD Take by mouth as directed.   Yes Historical Provider, MD  risperiDONE (RISPERDAL) 0.25 MG tablet Take 0.25 mg by mouth 2 (two) times daily.   Yes Historical Provider, MD  warfarin (COUMADIN) 2.5 MG tablet Take 1 tablet (2.5 mg total) by mouth daily. 09/05/12  Yes Monina C Medina-Vargas, NP    Family History  Family History  Problem Relation Age of Onset  . Colon cancer Neg Hx   . Heart disease Father   . Breast cancer Daughter     Social History  History   Social History  . Marital Status: Single    Spouse Name: N/A    Number of Children: 2  . Years of Education: N/A   Occupational History  . Retired     Pharmacist, hospital   Social History Main Topics  . Smoking status: Former Research scientist (life sciences)  .  Smokeless tobacco: Never Used  . Alcohol Use: No  . Drug Use: No  . Sexual Activity: Not on file   Other Topics Concern  . Not on file   Social History Narrative   No caffeine      Review of Systems, as per HPI, otherwise negative General:  No chills, fever, night sweats or weight changes.  Cardiovascular:  No chest pain, dyspnea on exertion, edema, orthopnea, palpitations, paroxysmal nocturnal dyspnea. Dermatological: No rash, lesions/masses Respiratory: No cough, dyspnea Urologic: No hematuria, dysuria Abdominal:   No nausea, vomiting, diarrhea, bright red blood per rectum, melena, or hematemesis Neurologic:  No visual changes, wkns, changes in mental status. All other systems reviewed and are otherwise negative except as noted above.  Physical Exam BP 136/74, HR 77 BPM Blood pressure 140/80, pulse 96.  General: Pleasant, NAD Psych: Normal affect. Neuro: Alert and oriented X 3. Moves all extremities spontaneously. HEENT: Normal  Neck: Supple without bruits or JVD. Lungs:  Resp regular and unlabored, crackles and wheezes. Heart: RRR no s3, s4, or murmurs. Abdomen: Soft, non-tender, non-distended, BS + x 4.  Extremities: No clubbing, cyanosis, mild pitting edema up to the mid thighs B/L. DP/PT/Radials 2+ and equal bilaterally.  Labs:  No results found for this basename: CKTOTAL, CKMB, TROPONINI,  in the last 72 hours Lab Results  Component Value Date   WBC 7.9 06/30/2013   HGB 12.8 06/30/2013   HCT 41.9 06/30/2013   MCV 96.1 06/30/2013   PLT 341 06/30/2013   Accessory Clinical Findings  Echocardiogram - 06/26/2013 - Left ventricle: The cavity size was normal. Wall thickness was increased in a pattern of mild LVH. Systolic function was normal. The estimated ejection fraction was in the range of 55% to 60%. Wall motion was normal; there were no regional wall motion abnormalities. - Aortic valve: Trivial regurgitation. - Left atrium: The atrium was mildly to moderately  dilated. - Right ventricle: The cavity size was mildly dilated. - Right atrium: The atrium was mildly dilated. - Pulmonary arteries: PA peak pressure: 4mm Hg (S). Impressions:  - No prior study for comparison.  ECG - atrial fibrillation, LPFB    Assessment & Plan  80 year old female   1. Chronic atrial fibrillation - rate controlled with diltiazem and digoxin. We switched to Xarelto 20 mg PO daily, she has normal kidney function and weighs 225 lbs. A-fib is rate controlled. Low fall risk as she doesn't walk.  2. New onset CHF, significant fluid overload - significantly improved on Lasix 40  mg po, we will continue and check BMP today. Echo showed preserved LVEF, mild pulmonary hypertension.   Follow up in 3 months.    Dorothy Spark, MD, [facility] 09/04/2013, 10:49 AM

## 2013-09-04 NOTE — Patient Instructions (Signed)
Your physician recommends that you return for lab work today for bmet.   Your physician recommends that you schedule a follow-up appointment in: 3 months with Dr. Meda Coffee.

## 2013-09-18 ENCOUNTER — Encounter: Payer: Self-pay | Admitting: Adult Health

## 2013-09-18 ENCOUNTER — Non-Acute Institutional Stay (SKILLED_NURSING_FACILITY): Payer: Medicare Other | Admitting: Adult Health

## 2013-09-18 DIAGNOSIS — A488 Other specified bacterial diseases: Secondary | ICD-10-CM | POA: Diagnosis not present

## 2013-09-18 DIAGNOSIS — R0602 Shortness of breath: Secondary | ICD-10-CM | POA: Diagnosis not present

## 2013-09-18 DIAGNOSIS — J189 Pneumonia, unspecified organism: Secondary | ICD-10-CM | POA: Diagnosis not present

## 2013-09-19 ENCOUNTER — Emergency Department (HOSPITAL_COMMUNITY): Payer: Medicare Other

## 2013-09-19 ENCOUNTER — Encounter (HOSPITAL_COMMUNITY): Payer: Self-pay | Admitting: Emergency Medicine

## 2013-09-19 ENCOUNTER — Inpatient Hospital Stay (HOSPITAL_COMMUNITY)
Admission: EM | Admit: 2013-09-19 | Discharge: 2013-09-25 | DRG: 193 | Disposition: A | Discharge status: Skilled Nursing Facility | Payer: Medicare Other | Attending: Internal Medicine | Admitting: Internal Medicine

## 2013-09-19 DIAGNOSIS — F0390 Unspecified dementia without behavioral disturbance: Secondary | ICD-10-CM | POA: Diagnosis present

## 2013-09-19 DIAGNOSIS — E785 Hyperlipidemia, unspecified: Secondary | ICD-10-CM | POA: Diagnosis present

## 2013-09-19 DIAGNOSIS — J40 Bronchitis, not specified as acute or chronic: Secondary | ICD-10-CM

## 2013-09-19 DIAGNOSIS — I5033 Acute on chronic diastolic (congestive) heart failure: Secondary | ICD-10-CM | POA: Diagnosis present

## 2013-09-19 DIAGNOSIS — T460X1A Poisoning by cardiac-stimulant glycosides and drugs of similar action, accidental (unintentional), initial encounter: Secondary | ICD-10-CM

## 2013-09-19 DIAGNOSIS — J9602 Acute respiratory failure with hypercapnia: Secondary | ICD-10-CM

## 2013-09-19 DIAGNOSIS — E662 Morbid (severe) obesity with alveolar hypoventilation: Secondary | ICD-10-CM | POA: Diagnosis present

## 2013-09-19 DIAGNOSIS — M6250 Muscle wasting and atrophy, not elsewhere classified, unspecified site: Secondary | ICD-10-CM | POA: Diagnosis not present

## 2013-09-19 DIAGNOSIS — K219 Gastro-esophageal reflux disease without esophagitis: Secondary | ICD-10-CM | POA: Diagnosis present

## 2013-09-19 DIAGNOSIS — Z7901 Long term (current) use of anticoagulants: Secondary | ICD-10-CM

## 2013-09-19 DIAGNOSIS — Z6834 Body mass index (BMI) 34.0-34.9, adult: Secondary | ICD-10-CM

## 2013-09-19 DIAGNOSIS — I509 Heart failure, unspecified: Secondary | ICD-10-CM | POA: Diagnosis present

## 2013-09-19 DIAGNOSIS — J189 Pneumonia, unspecified organism: Secondary | ICD-10-CM | POA: Diagnosis present

## 2013-09-19 DIAGNOSIS — I80299 Phlebitis and thrombophlebitis of other deep vessels of unspecified lower extremity: Secondary | ICD-10-CM

## 2013-09-19 DIAGNOSIS — E871 Hypo-osmolality and hyponatremia: Secondary | ICD-10-CM

## 2013-09-19 DIAGNOSIS — I5020 Unspecified systolic (congestive) heart failure: Secondary | ICD-10-CM | POA: Diagnosis not present

## 2013-09-19 DIAGNOSIS — F3289 Other specified depressive episodes: Secondary | ICD-10-CM | POA: Diagnosis not present

## 2013-09-19 DIAGNOSIS — G9341 Metabolic encephalopathy: Secondary | ICD-10-CM | POA: Diagnosis present

## 2013-09-19 DIAGNOSIS — Z66 Do not resuscitate: Secondary | ICD-10-CM | POA: Diagnosis present

## 2013-09-19 DIAGNOSIS — Z79899 Other long term (current) drug therapy: Secondary | ICD-10-CM | POA: Diagnosis not present

## 2013-09-19 DIAGNOSIS — Z803 Family history of malignant neoplasm of breast: Secondary | ICD-10-CM

## 2013-09-19 DIAGNOSIS — J209 Acute bronchitis, unspecified: Secondary | ICD-10-CM | POA: Diagnosis not present

## 2013-09-19 DIAGNOSIS — R5383 Other fatigue: Secondary | ICD-10-CM | POA: Diagnosis not present

## 2013-09-19 DIAGNOSIS — R059 Cough, unspecified: Secondary | ICD-10-CM | POA: Diagnosis not present

## 2013-09-19 DIAGNOSIS — I4891 Unspecified atrial fibrillation: Secondary | ICD-10-CM | POA: Diagnosis not present

## 2013-09-19 DIAGNOSIS — M129 Arthropathy, unspecified: Secondary | ICD-10-CM | POA: Diagnosis not present

## 2013-09-19 DIAGNOSIS — F22 Delusional disorders: Secondary | ICD-10-CM | POA: Diagnosis not present

## 2013-09-19 DIAGNOSIS — G4733 Obstructive sleep apnea (adult) (pediatric): Secondary | ICD-10-CM | POA: Diagnosis present

## 2013-09-19 DIAGNOSIS — Z87891 Personal history of nicotine dependence: Secondary | ICD-10-CM | POA: Diagnosis not present

## 2013-09-19 DIAGNOSIS — M6281 Muscle weakness (generalized): Secondary | ICD-10-CM | POA: Diagnosis not present

## 2013-09-19 DIAGNOSIS — R404 Transient alteration of awareness: Secondary | ICD-10-CM | POA: Diagnosis not present

## 2013-09-19 DIAGNOSIS — J9600 Acute respiratory failure, unspecified whether with hypoxia or hypercapnia: Secondary | ICD-10-CM | POA: Diagnosis present

## 2013-09-19 DIAGNOSIS — I5032 Chronic diastolic (congestive) heart failure: Secondary | ICD-10-CM | POA: Diagnosis not present

## 2013-09-19 DIAGNOSIS — J441 Chronic obstructive pulmonary disease with (acute) exacerbation: Secondary | ICD-10-CM | POA: Diagnosis present

## 2013-09-19 DIAGNOSIS — Z8249 Family history of ischemic heart disease and other diseases of the circulatory system: Secondary | ICD-10-CM

## 2013-09-19 DIAGNOSIS — J438 Other emphysema: Secondary | ICD-10-CM | POA: Diagnosis not present

## 2013-09-19 DIAGNOSIS — K5900 Constipation, unspecified: Secondary | ICD-10-CM

## 2013-09-19 DIAGNOSIS — R21 Rash and other nonspecific skin eruption: Secondary | ICD-10-CM

## 2013-09-19 DIAGNOSIS — K2100 Gastro-esophageal reflux disease with esophagitis, without bleeding: Secondary | ICD-10-CM | POA: Diagnosis not present

## 2013-09-19 DIAGNOSIS — F329 Major depressive disorder, single episode, unspecified: Secondary | ICD-10-CM | POA: Diagnosis not present

## 2013-09-19 DIAGNOSIS — R0602 Shortness of breath: Secondary | ICD-10-CM | POA: Diagnosis not present

## 2013-09-19 DIAGNOSIS — R5381 Other malaise: Secondary | ICD-10-CM | POA: Diagnosis not present

## 2013-09-19 DIAGNOSIS — F29 Unspecified psychosis not due to a substance or known physiological condition: Secondary | ICD-10-CM

## 2013-09-19 DIAGNOSIS — M1990 Unspecified osteoarthritis, unspecified site: Secondary | ICD-10-CM

## 2013-09-19 DIAGNOSIS — E878 Other disorders of electrolyte and fluid balance, not elsewhere classified: Secondary | ICD-10-CM

## 2013-09-19 DIAGNOSIS — R609 Edema, unspecified: Secondary | ICD-10-CM | POA: Diagnosis not present

## 2013-09-19 DIAGNOSIS — R600 Localized edema: Secondary | ICD-10-CM

## 2013-09-19 DIAGNOSIS — I10 Essential (primary) hypertension: Secondary | ICD-10-CM | POA: Diagnosis not present

## 2013-09-19 LAB — BLOOD GAS, ARTERIAL
ACID-BASE EXCESS: 11.7 mmol/L — AB (ref 0.0–2.0)
Acid-Base Excess: 9.7 mmol/L — ABNORMAL HIGH (ref 0.0–2.0)
Bicarbonate: 41.7 mEq/L — ABNORMAL HIGH (ref 20.0–24.0)
Bicarbonate: 41 mEq/L — ABNORMAL HIGH (ref 20.0–24.0)
DRAWN BY: 103701
DRAWN BY: 308601
Delivery systems: POSITIVE
Expiratory PAP: 5
FIO2: 0.3 %
INSPIRATORY PAP: 12
Mode: POSITIVE
O2 CONTENT: 4 L/min
O2 SAT: 99.4 %
O2 Saturation: 90.9 %
PATIENT TEMPERATURE: 98.6
PCO2 ART: 80.6 mmHg — AB (ref 35.0–45.0)
PH ART: 7.327 — AB (ref 7.350–7.450)
PO2 ART: 213 mmHg — AB (ref 80.0–100.0)
Patient temperature: 98.6
TCO2: 38.4 mmol/L (ref 0–100)
TCO2: 37.1 mmol/L (ref 0–100)
pCO2 arterial: 104 mmHg (ref 35.0–45.0)
pH, Arterial: 7.228 — ABNORMAL LOW (ref 7.350–7.450)
pO2, Arterial: 60.9 mmHg — ABNORMAL LOW (ref 80.0–100.0)

## 2013-09-19 LAB — CBC WITH DIFFERENTIAL/PLATELET
Basophils Absolute: 0 10*3/uL (ref 0.0–0.1)
Basophils Relative: 1 % (ref 0–1)
Eosinophils Absolute: 0.1 10*3/uL (ref 0.0–0.7)
Eosinophils Relative: 1 % (ref 0–5)
HCT: 41.8 % (ref 36.0–46.0)
HEMOGLOBIN: 13.2 g/dL (ref 12.0–15.0)
Lymphocytes Relative: 21 % (ref 12–46)
Lymphs Abs: 1.3 10*3/uL (ref 0.7–4.0)
MCH: 31.5 pg (ref 26.0–34.0)
MCHC: 31.6 g/dL (ref 30.0–36.0)
MCV: 99.8 fL (ref 78.0–100.0)
Monocytes Absolute: 1.2 10*3/uL — ABNORMAL HIGH (ref 0.1–1.0)
Monocytes Relative: 19 % — ABNORMAL HIGH (ref 3–12)
NEUTROS PCT: 59 % (ref 43–77)
Neutro Abs: 3.7 10*3/uL (ref 1.7–7.7)
PLATELETS: 224 10*3/uL (ref 150–400)
RBC: 4.19 MIL/uL (ref 3.87–5.11)
RDW: 16.5 % — ABNORMAL HIGH (ref 11.5–15.5)
WBC: 6.2 10*3/uL (ref 4.0–10.5)

## 2013-09-19 LAB — COMPREHENSIVE METABOLIC PANEL
ALK PHOS: 85 U/L (ref 39–117)
ALT: 26 U/L (ref 0–35)
AST: 40 U/L — ABNORMAL HIGH (ref 0–37)
Albumin: 3.1 g/dL — ABNORMAL LOW (ref 3.5–5.2)
BILIRUBIN TOTAL: 0.5 mg/dL (ref 0.3–1.2)
BUN: 14 mg/dL (ref 6–23)
CHLORIDE: 87 meq/L — AB (ref 96–112)
CO2: 34 mEq/L — ABNORMAL HIGH (ref 19–32)
Calcium: 9.1 mg/dL (ref 8.4–10.5)
Creatinine, Ser: 0.56 mg/dL (ref 0.50–1.10)
GFR, EST NON AFRICAN AMERICAN: 81 mL/min — AB (ref >90)
GLUCOSE: 104 mg/dL — AB (ref 70–99)
POTASSIUM: 4.9 meq/L (ref 3.7–5.3)
SODIUM: 135 meq/L — AB (ref 137–147)
Total Protein: 7.3 g/dL (ref 6.0–8.3)

## 2013-09-19 LAB — URINALYSIS, ROUTINE W REFLEX MICROSCOPIC
Bilirubin Urine: NEGATIVE
Glucose, UA: NEGATIVE mg/dL
Hgb urine dipstick: NEGATIVE
KETONES UR: NEGATIVE mg/dL
LEUKOCYTES UA: NEGATIVE
Nitrite: NEGATIVE
PROTEIN: NEGATIVE mg/dL
Specific Gravity, Urine: 1.01 (ref 1.005–1.030)
Urobilinogen, UA: 0.2 mg/dL (ref 0.0–1.0)
pH: 5 (ref 5.0–8.0)

## 2013-09-19 LAB — VALPROIC ACID LEVEL: VALPROIC ACID LVL: 57.2 ug/mL (ref 50.0–100.0)

## 2013-09-19 LAB — MRSA PCR SCREENING: MRSA BY PCR: NEGATIVE

## 2013-09-19 LAB — DIGOXIN LEVEL: DIGOXIN LVL: 2.5 ng/mL — AB (ref 0.8–2.0)

## 2013-09-19 LAB — PRO B NATRIURETIC PEPTIDE: Pro B Natriuretic peptide (BNP): 4084 pg/mL — ABNORMAL HIGH (ref 0–450)

## 2013-09-19 MED ORDER — IPRATROPIUM BROMIDE 0.02 % IN SOLN
0.5000 mg | Freq: Four times a day (QID) | RESPIRATORY_TRACT | Status: DC
  Administered 2013-09-19 – 2013-09-20 (×3): 0.5 mg via RESPIRATORY_TRACT
  Filled 2013-09-19 (×3): qty 2.5

## 2013-09-19 MED ORDER — VANCOMYCIN HCL IN DEXTROSE 1-5 GM/200ML-% IV SOLN
1000.0000 mg | INTRAVENOUS | Status: AC
  Administered 2013-09-19: 1000 mg via INTRAVENOUS
  Filled 2013-09-19: qty 200

## 2013-09-19 MED ORDER — ALBUTEROL SULFATE (2.5 MG/3ML) 0.083% IN NEBU
5.0000 mg | INHALATION_SOLUTION | Freq: Once | RESPIRATORY_TRACT | Status: AC
  Administered 2013-09-19: 5 mg via RESPIRATORY_TRACT
  Filled 2013-09-19: qty 6

## 2013-09-19 MED ORDER — DOCUSATE SODIUM 100 MG PO CAPS
100.0000 mg | ORAL_CAPSULE | Freq: Every day | ORAL | Status: DC
  Administered 2013-09-20 – 2013-09-24 (×5): 100 mg via ORAL
  Filled 2013-09-19 (×7): qty 1

## 2013-09-19 MED ORDER — IPRATROPIUM BROMIDE 0.02 % IN SOLN
0.5000 mg | Freq: Once | RESPIRATORY_TRACT | Status: AC
  Administered 2013-09-19: 0.5 mg via RESPIRATORY_TRACT
  Filled 2013-09-19: qty 2.5

## 2013-09-19 MED ORDER — VANCOMYCIN HCL 10 G IV SOLR
1000.0000 mg | INTRAVENOUS | Status: DC
  Filled 2013-09-19: qty 1000

## 2013-09-19 MED ORDER — GUAIFENESIN ER 600 MG PO TB12
600.0000 mg | ORAL_TABLET | Freq: Two times a day (BID) | ORAL | Status: DC | PRN
  Administered 2013-09-24: 600 mg via ORAL
  Filled 2013-09-19 (×2): qty 1

## 2013-09-19 MED ORDER — VALPROATE SODIUM 500 MG/5ML IV SOLN
125.0000 mg | Freq: Once | INTRAVENOUS | Status: AC
  Administered 2013-09-19: 125 mg via INTRAVENOUS
  Filled 2013-09-19: qty 1.25

## 2013-09-19 MED ORDER — DEXTROSE 5 % IV SOLN
1.0000 g | INTRAVENOUS | Status: AC
  Administered 2013-09-19: 1 g via INTRAVENOUS
  Filled 2013-09-19: qty 1

## 2013-09-19 MED ORDER — METHYLPREDNISOLONE SODIUM SUCC 125 MG IJ SOLR
80.0000 mg | Freq: Four times a day (QID) | INTRAMUSCULAR | Status: DC
  Administered 2013-09-19 – 2013-09-20 (×3): 80 mg via INTRAVENOUS
  Administered 2013-09-20: 15:00:00 via INTRAVENOUS
  Filled 2013-09-19 (×7): qty 1.28

## 2013-09-19 MED ORDER — DEXTROSE 5 % IV SOLN: 1.0000 g | Freq: Three times a day (TID) | INTRAVENOUS | Status: DC

## 2013-09-19 MED ORDER — VANCOMYCIN HCL IN DEXTROSE 750-5 MG/150ML-% IV SOLN
750.0000 mg | Freq: Two times a day (BID) | INTRAVENOUS | Status: DC
  Administered 2013-09-20 – 2013-09-21 (×3): 750 mg via INTRAVENOUS
  Filled 2013-09-19 (×4): qty 150

## 2013-09-19 MED ORDER — FUROSEMIDE 10 MG/ML IJ SOLN
20.0000 mg | Freq: Two times a day (BID) | INTRAMUSCULAR | Status: DC
  Administered 2013-09-20 – 2013-09-21 (×4): 20 mg via INTRAVENOUS
  Filled 2013-09-19 (×5): qty 2

## 2013-09-19 MED ORDER — ALBUTEROL SULFATE (2.5 MG/3ML) 0.083% IN NEBU
5.0000 mg | INHALATION_SOLUTION | Freq: Four times a day (QID) | RESPIRATORY_TRACT | Status: DC
  Administered 2013-09-19 – 2013-09-20 (×3): 5 mg via RESPIRATORY_TRACT
  Filled 2013-09-19 (×3): qty 6

## 2013-09-19 MED ORDER — ALBUTEROL SULFATE (2.5 MG/3ML) 0.083% IN NEBU: 5.0000 mg | INHALATION_SOLUTION | RESPIRATORY_TRACT | Status: DC | PRN

## 2013-09-19 MED ORDER — DIVALPROEX SODIUM 125 MG PO CPSP
125.0000 mg | ORAL_CAPSULE | Freq: Two times a day (BID) | ORAL | Status: DC
  Administered 2013-09-20 – 2013-09-25 (×11): 125 mg via ORAL
  Filled 2013-09-19 (×13): qty 1

## 2013-09-19 MED ORDER — METHYLPREDNISOLONE SODIUM SUCC 125 MG IJ SOLR
125.0000 mg | Freq: Once | INTRAMUSCULAR | Status: AC
  Administered 2013-09-19: 125 mg via INTRAVENOUS
  Filled 2013-09-19: qty 2

## 2013-09-19 MED ORDER — RIVAROXABAN 20 MG PO TABS
20.0000 mg | ORAL_TABLET | Freq: Every day | ORAL | Status: DC
  Filled 2013-09-19 (×2): qty 1

## 2013-09-19 MED ORDER — ACETAMINOPHEN 500 MG PO TABS: 500.0000 mg | ORAL_TABLET | Freq: Four times a day (QID) | ORAL | Status: DC | PRN

## 2013-09-19 MED ORDER — DILTIAZEM HCL ER COATED BEADS 120 MG PO CP24
120.0000 mg | ORAL_CAPSULE | Freq: Every day | ORAL | Status: DC
  Administered 2013-09-20 – 2013-09-25 (×6): 120 mg via ORAL
  Filled 2013-09-19 (×7): qty 1

## 2013-09-19 MED ORDER — FUROSEMIDE 10 MG/ML IJ SOLN
60.0000 mg | Freq: Once | INTRAMUSCULAR | Status: AC
  Administered 2013-09-19: 60 mg via INTRAVENOUS
  Filled 2013-09-19: qty 8

## 2013-09-19 NOTE — Progress Notes (Addendum)
ANTIBIOTIC CONSULT NOTE - INITIAL  Pharmacy Consult for:  Vancomycin Indication:  Early pneumonia / HCAP  Allergies  Allergen Reactions  . Chocolate     unknown  . Ciprofloxacin     unknown  . Coffee Bean Extract [Coffea Arabica]     unknown  . Gentamycin [Gentamicin]     unknown  . Imodium [Loperamide]     unknown  . Penicillins     unknown  . Sulfa Antibiotics     unknown  . Tea     unknown    Patient Measurements: Height: 5\' 5"  (165.1 cm) (taken 09/18/13) Weight: 209 lb 7 oz (95 kg) (taken 07/12/13) IBW/kg (Calculated) : 57   Vital Signs: Temp: 99.5 F (37.5 C) (04/08 1219) Temp src: Rectal (04/08 1219) BP: 126/67 mmHg (04/08 1800) Pulse Rate: 81 (04/08 1800)  Labs:  Recent Labs  09/19/13 1258  WBC 6.2  HGB 13.2  PLT 224  CREATININE 0.56   Estimated Creatinine Clearance: 56.5 ml/min (by C-G formula based on Cr of 0.56).    Medical History: Past Medical History  Diagnosis Date  . Arrhythmia   . Arthritis   . Atrial fibrillation     Remains stable (As of 05/09/13)  . H/O blood clots   . Hyperlipemia   . Dementia     Remains stable & continues to function adequately in the current living environment with supervision. Has had little changes in behavior (AS OF 05/09/13)  . GERD (gastroesophageal reflux disease)     Stable (As of 05/09/13)  . Osteoarthritis     Denies pain (As of 05/09/13)    Medications:  Scheduled:  . albuterol  5 mg Nebulization Q6H  . [START ON 09/20/2013] aztreonam  1 g Intravenous 3 times per day  . diltiazem  120 mg Oral Daily  . divalproex  125 mg Oral BID  . docusate sodium  100 mg Oral Daily  . furosemide  20 mg Intravenous Q12H  . ipratropium  0.5 mg Nebulization Q6H  . methylPREDNISolone sodium succinate  80 mg Intravenous Q6H  . Rivaroxaban  20 mg Oral Q supper   Assessment:  Asked to assist with Vancomycin therapy for this 80 year-old female with healthcare-associated pneumonia.  Aztreonam 1 gram every 8 hours  has also been ordered.  Goals of Therapy:   Vancomycin trough levels 15-20 mcg/ml  Eradication of infection  Plan:   Vancomycin 1000 mg IV initially (in ED), then 750 mg IV every 12 hours.  Levels as needed to guide dosing.  [place]. 09/19/2013,6:41 PM

## 2013-09-19 NOTE — Progress Notes (Signed)
Patient ID: Jennifer Nielsen, female   DOB: Apr 27, 1925, 80 y.o.   MRN: [id number]         PROGRESS NOTE  DATE: 09/18/13  FACILITY: [facility]  LEVEL OF CARE: SNF  Acute Visit  CHIEF COMPLAINT:  Manage Pneumonia  HISTORY OF PRESENT ILLNESS: This is an 80 year old female who has been having cough and congestion. She has moderate wheezing on bilateral upper lung fields. She is alert and able to make needs known. Daughter is at bedside. Chest x-ray shows patchy atelectasis or pneumonia at the lower lungs overall improved on the right and worse on the left.    PAST MEDICAL HISTORY : Reviewed.  No changes.  CURRENT MEDICATIONS: Reviewed per [facility]  REVIEW OF SYSTEMS:  GENERAL: no change in appetite, no fatigue, no weight changes, chills or weakness RESPIRATORY: no SOB, DOE, hemoptysis, + wheezing and coughing CARDIAC: no chest pain, or palpitations, + edema GI: no abdominal pain, diarrhea, constipation, heart burn, nausea or vomiting  PHYSICAL EXAMINATION  GENERAL: no acute distress, moderately obese body habitus NECK: supple, trachea midline, no neck masses, no thyroid tenderness, no thyromegaly RESPIRATORY: breathing is even & unlabored, + wheezing on bilateral upper lung fields CARDIAC: irregularly irregular, no murmur,no extra heart sounds, BLE edema 1+ GI: abdomen soft, normal BS, no masses, no tenderness, no hepatomegaly, no splenomegaly PSYCHIATRIC: the patient is alert & oriented to person, affect & behavior appropriate  LABS/RADIOLOGY: 07/09/13 sodium 140 potassium 4.1 glucose 80 BUN 14 creatinine 0.6 calcium 8.7 07/04/13 sodium 142 potassium 3.4 glucose 105 BUN 10 creatinine 0.4 calcium 8.7 06/23/13 sodium 118 potassium 5.5 glucose 98 BUN 16 creatinine 0.4 calcium 8.5 8-14 CO2 33, glucose 137, albumin 3.4 otherwise CMP normal, CBC normal 6/14 digoxin level 0.9, hemoglobin A1c 5.6, Depakote level 40.4 2/14 CBC normal, glucose 100, total protein 5.8 otherwise CMP normal, digoxin  level 0.5 11/13 Depakote level 65.7  ASSESSMENT/PLAN:  Pneumonia - start Doxycycline 100 mg PO Q 12 hours X 14 days, Mucinex 600 mg PO Q 12 hours X 14 days and albuterol 2.5 mg/3 ml via neb Q 6 hours routinely X 4 days then PRN  CPT CODE: 14431  Monina Vargas - NP [facility] [phone]

## 2013-09-19 NOTE — ED Notes (Signed)
Pt to ED from SNF with pneumonia.  Per report pt had SOB and SpO2 87% a couple of days ago.  CXR showed pneumonia. Symptoms began to improve however pt became lethargic this morning.

## 2013-09-19 NOTE — ED Notes (Signed)
Pt transported to inpatient admit bed with RN on monitor.

## 2013-09-19 NOTE — ED Notes (Signed)
Portable chest xray in progress.

## 2013-09-19 NOTE — ED Notes (Signed)
Bed: WA06 Expected date:  Expected time:  Means of arrival:  Comments: EMS- elderly, lethargic, fever, recently PNA diagnoses

## 2013-09-19 NOTE — ED Provider Notes (Signed)
CSN: [id number]     Arrival date & time 09/19/13  1159 History   First MD Initiated Contact with Patient 09/19/13 1222     Chief Complaint  Patient presents with  . Pneumonia   Level V caveat for dementia  (Consider location/radiation/quality/duration/timing/severity/associated sxs/prior Treatment) HPI Patient presents emergency Department with EMS. They report patient has had cough and shortness of breath that started a few days ago. She was noted to have a pulse ox of 87% on room air. She had a chest x-ray done which showed pneumonia and patient has been started on doxycycline. He reported change in mental status this morning. Patient's daughter states she noted patient started coughing 3 days ago. She also states she was told the patient had fever this morning. Patient has a history of COPD and has been on albuterol nebulizers. They deny any nausea, vomiting, diarrhea. She also denies any chest pain. Daughter states the patient had pneumonia in January and was admitted to the hospital for a week.  PCP Dr Day  Past Medical History  Diagnosis Date  . Arrhythmia   . Arthritis   . Atrial fibrillation     Remains stable (As of 05/09/13)  . H/O blood clots   . Hyperlipemia   . Dementia     Remains stable & continues to function adequately in the current living environment with supervision. Has had little changes in behavior (AS OF 05/09/13)  . GERD (gastroesophageal reflux disease)     Stable (As of 05/09/13)  . Osteoarthritis     Denies pain (As of 05/09/13)   Past Surgical History  Procedure Laterality Date  . Appendectomy    . Gallbladder surgery    . Hip surgery      Right-Metal plate   . Leg surgery      Left leg   Family History  Problem Relation Age of Onset  . Colon cancer Neg Hx   . Heart disease Father   . Breast cancer Daughter    History  Substance Use Topics  . Smoking status: Former Research scientist (life sciences)  . Smokeless tobacco: Never Used  . Alcohol Use: No    Pt in SNF  for past 2 years No oxygen   OB History   Grav Para Term Preterm Abortions TAB SAB Ect Mult Living                   Review of Systems  Unable to perform ROS: Dementia      Allergies  Chocolate; Ciprofloxacin; Coffee bean extract; Gentamycin; Imodium; Penicillins; Sulfa antibiotics; and Tea  Home Medications   Current Outpatient Rx  Name  Route  Sig  Dispense  Refill  . acetaminophen (TYLENOL) 500 MG tablet   Oral   Take 500 mg by mouth 3 (three) times daily.          Marland Kitchen albuterol (ACCUNEB) 1.25 MG/3ML nebulizer solution   Nebulization   Take 2 ampules by nebulization every 6 (six) hours. Albuterol 2.5mg /61ml every 6 hours x 4 days         . albuterol (ACCUNEB) 1.25 MG/3ML nebulizer solution   Nebulization   Take 2 ampules by nebulization every 6 (six) hours as needed for wheezing. Albuterol 2.5 mg/100ml via neb every 6 hours as needed-to start on 09/22/2013 at 6pm         . Calcium Carbonate-Vitamin D (CALTRATE 600+D PO)   Oral   Take 1 tablet by mouth every morning. For calcium supplement         .  digoxin (LANOXIN) 0.125 MG tablet   Oral   Take 0.125 mg by mouth every Tuesday, Thursday, Saturday, and Sunday.         . digoxin (LANOXIN) 0.25 MG tablet   Oral   Take 0.25 mg by mouth every Monday, Wednesday, and Friday. Hold if heart rate is below 60.         . diltiazem (CARDIZEM CD) 120 MG 24 hr capsule   Oral   Take 120 mg by mouth daily. For A-Fib         . divalproex (DEPAKOTE SPRINKLE) 125 MG capsule   Oral   Take 125 mg by mouth 2 (two) times daily. For mood stabilzer         . Docusate Sodium (DSS) 100 MG CAPS   Oral   Take 100 mg by mouth daily. For constipation         . doxycycline (VIBRAMYCIN) 100 MG capsule   Oral   Take 100 mg by mouth 2 (two) times daily. 1 tablet bid x 14 days         . furosemide (LASIX) 40 MG tablet   Oral   Take 40 mg by mouth daily. For HTN         . GuaiFENesin (MUCINEX PO)   Oral   Take 600 mg  by mouth 2 (two) times daily. Take 1 tablet bid x 14 days         . OLANZapine (ZYPREXA) 2.5 MG tablet   Oral   Take 2.5 mg by mouth every evening. For psychosis         . omeprazole (PRILOSEC) 20 MG capsule   Oral   Take 20 mg by mouth daily before breakfast. For GERD         . Polyethyl Glycol-Propyl Glycol (SYSTANE OP)   Both Eyes   Place 1 drop into both eyes 2 (two) times daily. For dry eyes         . potassium chloride (K-DUR) 10 MEQ tablet   Oral   Take 10 mEq by mouth daily. For supplement         . Rivaroxaban (XARELTO) 20 MG TABS tablet   Oral   Take 20 mg by mouth daily. For DVT          ED Triage Vitals  Enc Vitals Group     BP 09/19/13 1219 143/58 mmHg     Pulse Rate 09/19/13 1219 74     Resp 09/19/13 1219 32     Temp 09/19/13 1219 99.5 F (37.5 C)     Temp src 09/19/13 1219 Rectal     SpO2 09/19/13 1200 100 %     Weight --      Height --      Head Cir --      Peak Flow --      Pain Score --      Pain Loc --      Pain Edu? --      Excl. in [place]? --    Vital signs normal except for a low-grade temp, pulse ox obtained with 6 L per minute nasal cannula   Physical Exam  Nursing note and vitals reviewed. Constitutional: She is oriented to person, place, and time. She appears well-developed and well-nourished.  Non-toxic appearance. She does not appear ill. No distress.  HENT:  Head: Normocephalic and atraumatic.  Right Ear: External ear normal.  Left Ear: External ear normal.  Nose: Nose normal. No  mucosal edema or rhinorrhea.  Mouth/Throat: Oropharynx is clear and moist and mucous membranes are normal. No dental abscesses or uvula swelling.  Eyes: Conjunctivae and EOM are normal. Pupils are equal, round, and reactive to light.  Neck: Normal range of motion and full passive range of motion without pain. Neck supple.  Cardiovascular: Normal rate, regular rhythm and normal heart sounds.  Exam reveals no gallop and no friction rub.   No murmur  heard. Pulmonary/Chest: She is in respiratory distress. She has wheezes. She has no rhonchi. She has no rales. She exhibits no tenderness and no crepitus.  Coughing frequently  Abdominal: Soft. Normal appearance and bowel sounds are normal. She exhibits no distension. There is no tenderness. There is no rebound and no guarding.  Musculoskeletal: Normal range of motion. She exhibits no edema and no tenderness.  Moves all extremities well.   Neurological: She is alert and oriented to person, place, and time. She has normal strength. No cranial nerve deficit.  Skin: Skin is warm, dry and intact. No rash noted. No erythema. No pallor.  Flushed face, skin feels warm to touch  Psychiatric: She has a normal mood and affect. Her speech is normal and behavior is normal. Her mood appears not anxious.    ED Course  Procedures (including critical care time)  Medications  aztreonam (AZACTAM) 1 g in dextrose 5 % 50 mL IVPB (not administered)  albuterol (PROVENTIL) (2.5 MG/3ML) 0.083% nebulizer solution 5 mg (5 mg Nebulization Given 09/19/13 1311)  ipratropium (ATROVENT) nebulizer solution 0.5 mg (0.5 mg Nebulization Given 09/19/13 1311)  methylPREDNISolone sodium succinate (SOLU-MEDROL) 125 mg/2 mL injection 125 mg (125 mg Intravenous Given 09/19/13 1345)  aztreonam (AZACTAM) 1 g in dextrose 5 % 50 mL IVPB (0 g Intravenous Stopped 09/19/13 1437)  vancomycin (VANCOCIN) IVPB 1000 mg/200 mL premix (1,000 mg Intravenous New Bag/Given 09/19/13 1440)  albuterol (PROVENTIL) (2.5 MG/3ML) 0.083% nebulizer solution 5 mg (5 mg Nebulization Given 09/19/13 1458)  ipratropium (ATROVENT) nebulizer solution 0.5 mg (0.5 mg Nebulization Given 09/19/13 1459)  furosemide (LASIX) injection 60 mg (60 mg Intravenous Given 09/19/13 1440)  albuterol (PROVENTIL) (2.5 MG/3ML) 0.083% nebulizer solution 5 mg (5 mg Nebulization Given 09/19/13 1546)  ipratropium (ATROVENT) nebulizer solution 0.5 mg (0.5 mg Nebulization Given 09/19/13 1546)   Started on  healthcare associated pneumonia antibiotics and she was admitted in January. She also lives in a nursing home type facility.  Recheck at 1440 patient still has some coughing. She still has some scattered wheezing. Review her chart shows past history of congestive heart failure. Lasix was added since her BNP is higher than her prior BNP in the chart. Patient is maintaining her oxygenation on nasal cannula  Recheck 15:40 still has some scattered wheezing, still looks tachypneic, but improved. Discussed test results with daughters. They are agreeable with admission.  15:49 Dr Broadus John admit to tele, team 8  Labs Review Results for orders placed during the hospital encounter of 09/19/13  CBC WITH DIFFERENTIAL      Result Value Ref Range   WBC 6.2  4.0 - 10.5 K/uL   RBC 4.19  3.87 - 5.11 MIL/uL   Hemoglobin 13.2  12.0 - 15.0 g/dL   HCT 41.8  36.0 - 46.0 %   MCV 99.8  78.0 - 100.0 fL   MCH 31.5  26.0 - 34.0 pg   MCHC 31.6  30.0 - 36.0 g/dL   RDW 16.5 (*) 11.5 - 15.5 %   Platelets 224  150 - 400  K/uL   Neutrophils Relative % 59  43 - 77 %   Neutro Abs 3.7  1.7 - 7.7 K/uL   Lymphocytes Relative 21  12 - 46 %   Lymphs Abs 1.3  0.7 - 4.0 K/uL   Monocytes Relative 19 (*) 3 - 12 %   Monocytes Absolute 1.2 (*) 0.1 - 1.0 K/uL   Eosinophils Relative 1  0 - 5 %   Eosinophils Absolute 0.1  0.0 - 0.7 K/uL   Basophils Relative 1  0 - 1 %   Basophils Absolute 0.0  0.0 - 0.1 K/uL  COMPREHENSIVE METABOLIC PANEL      Result Value Ref Range   Sodium 135 (*) 137 - 147 mEq/L   Potassium 4.9  3.7 - 5.3 mEq/L   Chloride 87 (*) 96 - 112 mEq/L   CO2 34 (*) 19 - 32 mEq/L   Glucose, Bld 104 (*) 70 - 99 mg/dL   BUN 14  6 - 23 mg/dL   Creatinine, Ser 0.56  0.50 - 1.10 mg/dL   Calcium 9.1  8.4 - 10.5 mg/dL   Total Protein 7.3  6.0 - 8.3 g/dL   Albumin 3.1 (*) 3.5 - 5.2 g/dL   AST 40 (*) 0 - 37 U/L   ALT 26  0 - 35 U/L   Alkaline Phosphatase 85  39 - 117 U/L   Total Bilirubin 0.5  0.3 - 1.2 mg/dL   GFR calc  non Af Amer 81 (*) >90 mL/min   GFR calc Af Amer >90  >90 mL/min  URINALYSIS, ROUTINE W REFLEX MICROSCOPIC      Result Value Ref Range   Color, Urine YELLOW  YELLOW   APPearance CLEAR  CLEAR   Specific Gravity, Urine 1.010  1.005 - 1.030   pH 5.0  5.0 - 8.0   Glucose, UA NEGATIVE  NEGATIVE mg/dL   Hgb urine dipstick NEGATIVE  NEGATIVE   Bilirubin Urine NEGATIVE  NEGATIVE   Ketones, ur NEGATIVE  NEGATIVE mg/dL   Protein, ur NEGATIVE  NEGATIVE mg/dL   Urobilinogen, UA 0.2  0.0 - 1.0 mg/dL   Nitrite NEGATIVE  NEGATIVE   Leukocytes, UA NEGATIVE  NEGATIVE  DIGOXIN LEVEL      Result Value Ref Range   Digoxin Level 2.5 (*) 0.8 - 2.0 ng/mL  PRO B NATRIURETIC PEPTIDE      Result Value Ref Range   Pro B Natriuretic peptide (BNP) 4084.0 (*) 0 - 450 pg/mL   Laboratory interpretation all normal except elevated BNP   Imaging Review Dg Chest Portable 1 View  09/19/2013   CLINICAL DATA:  Shortness of Breath  EXAM: PORTABLE CHEST - 1 VIEW  COMPARISON:  August 23, 2013  FINDINGS: There is underlying emphysematous change. There is no edema consolidation. The heart is enlarged with normal pulmonary vascularity. No adenopathy. There is osteoarthritic change in both shoulders.  IMPRESSION: Cardiomegaly.  No edema or consolidation.  Underlying emphysema.   Electronically Signed   By: Lowella Grip M.D.   On: 09/19/2013 13:40     EKG Interpretation None      Date: 09/19/2013  Rate: 105  Rhythm: atrial fibrillation  QRS Axis: normal  Intervals: normal  ST/T Wave abnormalities: nonspecific ST/T changes  Conduction Disutrbances:none  Narrative Interpretation:   Old EKG Reviewed: none available   MDM   Final diagnoses:  COPD with exacerbation  Digoxin toxicity  Bronchitis  CHF (congestive heart failure)    Plan admission  Rolland Porter, MD,  Alanson Aly, MD 09/19/13 1616

## 2013-09-19 NOTE — H&P (Addendum)
[facility] History and Physical  Jennifer Nielsen [id number] DOB: 05/02/1925 DOA: 09/19/2013  Referring physician: EDP PCP: [name], MD   Chief Complaint: fever, shortness of breath, cough  HPI: Jennifer Nielsen is a 80 y.o. female with PMH of COPD, chronic diastolic CHF, Afib now on Xarelto, Mild Dementia, resident of [facility] was sent to the ER with the above complaint. History is provided by daughters at bedside, they report cough and congestion since this weekend. In addition she also started having fevers since yesterday, today was more lethargic and hence transferred to the ER for further management She had an CXR today at SNF and was suspected to have pnuemonia and ordered to start Abx for this. In ER, tachypneic, lethargic, [facility] consulted, CXR unremarkable  Review of Systems:  Constitutional:  No weight loss, night sweats, Fevers, chills, fatigue.  HEENT:  No headaches, Difficulty swallowing,Tooth/dental problems,Sore throat,  No sneezing, itching, ear ache, nasal congestion, post nasal drip,  Cardio-vascular:  No chest pain, Orthopnea, PND, swelling in lower extremities, anasarca, dizziness, palpitations  GI:  No heartburn, indigestion, abdominal pain, nausea, vomiting, diarrhea, change in bowel habits, loss of appetite  Resp:  No shortness of breath with exertion or at rest. No excess mucus, no productive cough, No non-productive cough, No coughing up of blood.No change in color of mucus.No wheezing.No chest wall deformity  Skin:  no rash or lesions.  GU:  no dysuria, change in color of urine, no urgency or frequency. No flank pain.  Musculoskeletal:  No joint pain or swelling. No decreased range of motion. No back pain.  Psych:  No change in mood or affect. No depression or anxiety. No memory loss.   Past Medical History  Diagnosis Date  . Arrhythmia   . Arthritis   . Atrial fibrillation     Remains stable (As of 05/09/13)  . H/O blood clots   .  Hyperlipemia   . Dementia     Remains stable & continues to function adequately in the current living environment with supervision. Has had little changes in behavior (AS OF 05/09/13)  . GERD (gastroesophageal reflux disease)     Stable (As of 05/09/13)  . Osteoarthritis     Denies pain (As of 05/09/13)   Past Surgical History  Procedure Laterality Date  . Appendectomy    . Gallbladder surgery    . Hip surgery      Right-Metal plate   . Leg surgery      Left leg   Social History:  reports that she has quit smoking. She has never used smokeless tobacco. She reports that she does not drink alcohol or use illicit drugs.  Allergies  Allergen Reactions  . Chocolate     unknown  . Ciprofloxacin     unknown  . Coffee Bean Extract [Coffea Arabica]     unknown  . Gentamycin [Gentamicin]     unknown  . Imodium [Loperamide]     unknown  . Penicillins     unknown  . Sulfa Antibiotics     unknown  . Tea     unknown    Family History  Problem Relation Age of Onset  . Colon cancer Neg Hx   . Heart disease Father   . Breast cancer Daughter      Prior to Admission medications   Medication Sig Start Date End Date Taking? Authorizing Provider  acetaminophen (TYLENOL) 500 MG tablet Take 500 mg by mouth 3 (three) times daily.    Yes  Historical Provider, MD  albuterol (ACCUNEB) 1.25 MG/3ML nebulizer solution Take 2 ampules by nebulization every 6 (six) hours. Albuterol 2.5mg /38ml every 6 hours x 4 days 09/18/13 09/22/13 Yes Historical Provider, MD  albuterol (ACCUNEB) 1.25 MG/3ML nebulizer solution Take 2 ampules by nebulization every 6 (six) hours as needed for wheezing. Albuterol 2.5 mg/60ml via neb every 6 hours as needed-to start on 09/22/2013 at 6pm 09/22/13  Yes Historical Provider, MD  Calcium Carbonate-Vitamin D (CALTRATE 600+D PO) Take 1 tablet by mouth every morning. For calcium supplement   Yes Historical Provider, MD  digoxin (LANOXIN) 0.125 MG tablet Take 0.125 mg by mouth every  Tuesday, Thursday, Saturday, and Sunday.   Yes Historical Provider, MD  digoxin (LANOXIN) 0.25 MG tablet Take 0.25 mg by mouth every Monday, Wednesday, and Friday. Hold if heart rate is below 60.   Yes Historical Provider, MD  diltiazem (CARDIZEM CD) 120 MG 24 hr capsule Take 120 mg by mouth daily. For A-Fib 07/02/13  Yes Bobby Rumpf York, PA-C  divalproex (DEPAKOTE SPRINKLE) 125 MG capsule Take 125 mg by mouth 2 (two) times daily. For mood stabilzer   Yes Historical Provider, MD  Docusate Sodium (DSS) 100 MG CAPS Take 100 mg by mouth daily. For constipation 07/02/13  Yes Bobby Rumpf York, PA-C  doxycycline (VIBRAMYCIN) 100 MG capsule Take 100 mg by mouth 2 (two) times daily. 1 tablet bid x 14 days 09/18/13 09/25/13 Yes Historical Provider, MD  furosemide (LASIX) 40 MG tablet Take 40 mg by mouth daily. For HTN   Yes Historical Provider, MD  GuaiFENesin (MUCINEX PO) Take 600 mg by mouth 2 (two) times daily. Take 1 tablet bid x 14 days 09/18/13 09/25/13 Yes Historical Provider, MD  OLANZapine (ZYPREXA) 2.5 MG tablet Take 2.5 mg by mouth every evening. For psychosis   Yes Historical Provider, MD  omeprazole (PRILOSEC) 20 MG capsule Take 20 mg by mouth daily before breakfast. For GERD   Yes Historical Provider, MD  Polyethyl Glycol-Propyl Glycol (SYSTANE OP) Place 1 drop into both eyes 2 (two) times daily. For dry eyes   Yes Historical Provider, MD  potassium chloride (K-DUR) 10 MEQ tablet Take 10 mEq by mouth daily. For supplement   Yes Historical Provider, MD  Rivaroxaban (XARELTO) 20 MG TABS tablet Take 20 mg by mouth daily. For DVT   Yes Historical Provider, MD   Physical Exam: Filed Vitals:   09/19/13 1600  BP: 150/134  Pulse: 101  Temp:   Resp: 28    BP 150/134  Pulse 101  Temp(Src) 99.5 F (37.5 C) (Rectal)  Resp 28  SpO2 100%  General:  Lethragic, arousible, morbidly obese Eyes: PERRL, normal lids, irises & conjunctiva ENT: grossly normal hearing, lips & tongue Neck: no LAD, masses or  thyromegaly Cardiovascular: S1S2/Irregular rate and rhythm Respiratory: diffuse coarse crackles/ronchi and scattered wheezes Abdomen: soft, slightly distended, NT, BS present Skin: no rash or induration seen on limited exam Musculoskeletal: grossly normal tone BUE/BLE Psychiatric: grossly normal mood and affect, speech fluent and appropriate Neurologic: grossly non-focal.          Labs on Admission:  Basic Metabolic Panel:  Recent Labs Lab 09/19/13 1258  NA 135*  K 4.9  CL 87*  CO2 34*  GLUCOSE 104*  BUN 14  CREATININE 0.56  CALCIUM 9.1   Liver Function Tests:  Recent Labs Lab 09/19/13 1258  AST 40*  ALT 26  ALKPHOS 85  BILITOT 0.5  PROT 7.3  ALBUMIN 3.1*   No results  found for this basename: LIPASE, AMYLASE,  in the last 168 hours No results found for this basename: AMMONIA,  in the last 168 hours CBC:  Recent Labs Lab 09/19/13 1258  WBC 6.2  NEUTROABS 3.7  HGB 13.2  HCT 41.8  MCV 99.8  PLT 224   Cardiac Enzymes: No results found for this basename: CKTOTAL, CKMB, CKMBINDEX, TROPONINI,  in the last 168 hours  BNP (last 3 results)  Recent Labs  06/25/13 1337 09/19/13 1258  PROBNP 3381.0* 4084.0*   CBG: No results found for this basename: GLUCAP,  in the last 168 hours  Radiological Exams on Admission: Dg Chest Portable 1 View  09/19/2013   CLINICAL DATA:  Shortness of Breath  EXAM: PORTABLE CHEST - 1 VIEW  COMPARISON:  August 23, 2013  FINDINGS: There is underlying emphysematous change. There is no edema consolidation. The heart is enlarged with normal pulmonary vascularity. No adenopathy. There is osteoarthritic change in both shoulders.  IMPRESSION: Cardiomegaly.  No edema or consolidation.  Underlying emphysema.   Electronically Signed   By: Lowella Grip M.D.   On: 09/19/2013 13:40    EKG: Independently reviewed. Pending, reordered  Assessment/Plan   Acute respiratory failure  -suspect multifactorial, COPD exacerbation, ?  Developing/Early pneumonia, likely has undiagnosed OSA/OHS too  -severe congestion and some wheezing on exam  -check STAT ABG  -BIPAP now , DNR but ok with BIPAP  -IV solumedrol, nebs  -Empiric Abx-Vanc/Aztreonam  -repeat CXR in am   -discussed with daughters the possibility of not improving despite BIPAP and medical therapy as above  -D/w PCCM Dr.Byrum, they will watch from [place] and consult depending on clinical course      COPD with exacerbation -see above    Atrial fibrillation -continue diltiazem/xarelto -Dig level slightly elevated, hold dose, repeat dig level in am     Metabolic encephalopathy -due to acute illness, possible CO2 narcosis, await ABG    Dementia -daughters surpirsed to hear this but clearly documented in previous hospital and office notes -continue depakote sprinkles, hold olanzepine    Chronic diastolic CHF -very difficult to determine volume status given body habitus, no overt heart failure, but BNP slight up compared to recent level -change lasix to 20mg  IV q12 for now  DVt proph: on xarelto chronically  Code Status: DNR Family Communication: discussed with both daughters at bedside Disposition Plan: admit to SDU  Time spent: 63min  Kendalyn Cranfield [facility] Pager [phone]

## 2013-09-20 ENCOUNTER — Inpatient Hospital Stay (HOSPITAL_COMMUNITY): Payer: Medicare Other

## 2013-09-20 DIAGNOSIS — J9600 Acute respiratory failure, unspecified whether with hypoxia or hypercapnia: Secondary | ICD-10-CM | POA: Diagnosis not present

## 2013-09-20 DIAGNOSIS — I4891 Unspecified atrial fibrillation: Secondary | ICD-10-CM | POA: Diagnosis not present

## 2013-09-20 DIAGNOSIS — R05 Cough: Secondary | ICD-10-CM | POA: Diagnosis not present

## 2013-09-20 DIAGNOSIS — J441 Chronic obstructive pulmonary disease with (acute) exacerbation: Secondary | ICD-10-CM | POA: Diagnosis not present

## 2013-09-20 LAB — URINE CULTURE
Colony Count: 50000
Special Requests: NORMAL

## 2013-09-20 LAB — COMPREHENSIVE METABOLIC PANEL
ALT: 26 U/L (ref 0–35)
AST: 31 U/L (ref 0–37)
Albumin: 3 g/dL — ABNORMAL LOW (ref 3.5–5.2)
Alkaline Phosphatase: 80 U/L (ref 39–117)
BUN: 16 mg/dL (ref 6–23)
CO2: 41 mEq/L (ref 19–32)
CREATININE: 0.53 mg/dL (ref 0.50–1.10)
Calcium: 9 mg/dL (ref 8.4–10.5)
Chloride: 88 mEq/L — ABNORMAL LOW (ref 96–112)
GFR calc Af Amer: >90 mL/min (ref >90)
GFR calc non Af Amer: 83 mL/min — ABNORMAL LOW (ref >90)
Glucose, Bld: 189 mg/dL — ABNORMAL HIGH (ref 70–99)
Potassium: 4.6 mEq/L (ref 3.7–5.3)
SODIUM: 137 meq/L (ref 137–147)
TOTAL PROTEIN: 7.2 g/dL (ref 6.0–8.3)
Total Bilirubin: 0.4 mg/dL (ref 0.3–1.2)

## 2013-09-20 LAB — CBC
HCT: 42.2 % (ref 36.0–46.0)
Hemoglobin: 13.3 g/dL (ref 12.0–15.0)
MCH: 31.4 pg (ref 26.0–34.0)
MCHC: 31.5 g/dL (ref 30.0–36.0)
MCV: 99.5 fL (ref 78.0–100.0)
Platelets: 199 10*3/uL (ref 150–400)
RBC: 4.24 MIL/uL (ref 3.87–5.11)
RDW: 15.6 % — AB (ref 11.5–15.5)
WBC: 3.9 10*3/uL — ABNORMAL LOW (ref 4.0–10.5)

## 2013-09-20 LAB — GLUCOSE, CAPILLARY: GLUCOSE-CAPILLARY: 138 mg/dL — AB (ref 70–99)

## 2013-09-20 LAB — DIGOXIN LEVEL: Digoxin Level: 1.3 ng/mL (ref 0.8–2.0)

## 2013-09-20 MED ORDER — RIVAROXABAN 20 MG PO TABS
20.0000 mg | ORAL_TABLET | Freq: Every day | ORAL | Status: DC
  Administered 2013-09-21 – 2013-09-24 (×4): 20 mg via ORAL
  Filled 2013-09-20 (×5): qty 1

## 2013-09-20 MED ORDER — ALBUTEROL SULFATE (2.5 MG/3ML) 0.083% IN NEBU
2.5000 mg | INHALATION_SOLUTION | Freq: Four times a day (QID) | RESPIRATORY_TRACT | Status: DC
  Administered 2013-09-20 – 2013-09-21 (×6): 2.5 mg via RESPIRATORY_TRACT
  Filled 2013-09-20 (×6): qty 3

## 2013-09-20 MED ORDER — IPRATROPIUM-ALBUTEROL 0.5-2.5 (3) MG/3ML IN SOLN
RESPIRATORY_TRACT | Status: AC
  Filled 2013-09-20: qty 3

## 2013-09-20 MED ORDER — IPRATROPIUM-ALBUTEROL 0.5-2.5 (3) MG/3ML IN SOLN
3.0000 mL | Freq: Four times a day (QID) | RESPIRATORY_TRACT | Status: DC
  Administered 2013-09-20 – 2013-09-24 (×18): 3 mL via RESPIRATORY_TRACT
  Filled 2013-09-20 (×17): qty 3

## 2013-09-20 MED ORDER — DIGOXIN 125 MCG PO TABS
0.1250 mg | ORAL_TABLET | ORAL | Status: DC
  Administered 2013-09-20 – 2013-09-25 (×4): 0.125 mg via ORAL
  Filled 2013-09-20 (×4): qty 1

## 2013-09-20 MED ORDER — METHYLPREDNISOLONE SODIUM SUCC 125 MG IJ SOLR
60.0000 mg | Freq: Four times a day (QID) | INTRAMUSCULAR | Status: DC
  Administered 2013-09-20 – 2013-09-21 (×3): 60 mg via INTRAVENOUS
  Filled 2013-09-20 (×7): qty 0.96

## 2013-09-20 MED ORDER — DEXTROSE 5 % IV SOLN
1.0000 g | Freq: Three times a day (TID) | INTRAVENOUS | Status: DC
  Administered 2013-09-20 – 2013-09-24 (×13): 1 g via INTRAVENOUS
  Filled 2013-09-20 (×16): qty 1

## 2013-09-20 MED ORDER — RIVAROXABAN 20 MG PO TABS
20.0000 mg | ORAL_TABLET | Freq: Once | ORAL | Status: AC
  Administered 2013-09-20: 20 mg via ORAL
  Filled 2013-09-20: qty 1

## 2013-09-20 NOTE — Progress Notes (Signed)
CRITICAL VALUE ALERT  Critical value received: CO2 41 Date of notification: 09/20/2013  Time of notification:  0359  Critical value read back:yes  Nurse who received alert:  Eduard Clos  MD notified (1st page):  NP Schorr  Time of first page: 0400  MD notified (2nd page): np schorr  Time of second page:  Responding MD: np Schorr on flr after page and made aware. No new orders given  Time MD responded: [id number]

## 2013-09-20 NOTE — Progress Notes (Signed)
[facility] PROGRESS NOTE  Jennifer Nielsen KPT:[id number] DOB: Dec 19, 1924 DOA: 09/19/2013 PCP: [name], MD  Assessment/Plan: Acute respiratory failure  -Improving -suspect multifactorial, COPD exacerbation, ? Developing/Early pneumonia, likely has undiagnosed OSA/OHS too  -severe congestion and some wheezing on admission -severe Resp acidosis on admission, improved on BIPAP -DC BIPAP, QHS PRN  -continue IV solumedrol, cut down dose, nebs  -Empiric Abx-Vanc/Aztreonam  -repeat CXR today -D/w PCCM Dr.Byrum on admission   COPD with exacerbation  -see above   Atrial fibrillation  -rate controlled -continue diltiazem/xarelto  -Dig level slightly elevated on admission, improved with holding dose, resume dig   Metabolic encephalopathy  -due to acute illness, CO2 narcosis -improved  Dementia  -daughters surpirsed to hear this but clearly documented in previous hospital and office notes  -continue depakote sprinkles, hold olanzepine   Chronic diastolic CHF  -very difficult to determine volume status given body habitus, no overt heart failure, but BNP slighty up compared to recent level  -continue IV lasix to 20mg  IV q12 for now, change to PO in am  DVt proph: on xarelto chronically   Code Status: DNR  Family Communication: discussed with both daughters at bedside  Disposition Plan: keep in SDU today    Consultants:  D/w PCCM  HPI/Subjective: Feels better, breathing better  Objective: Filed Vitals:   09/20/13 0800  BP: 142/59  Pulse: 77  Temp: 97.9 F (36.6 C)  Resp: 20    Intake/Output Summary (Last 24 hours) at 09/20/13 1501 Last data filed at 09/20/13 0557  Gross per 24 hour  Intake  51.25 ml  Output    301 ml  Net -249.75 ml   Filed Weights   09/19/13 1800  Weight: 95 kg (209 lb 7 oz)    Exam:   General:  AAOx to self and place  Cardiovascular: S1S2/RRR  Respiratory: improved air movement  Abdomen: soft, Obese, Nt, BS  present  Musculoskeletal:no edema c/c  Data Reviewed: Basic Metabolic Panel:  Recent Labs Lab 09/19/13 1258 09/20/13 0318  NA 135* 137  K 4.9 4.6  CL 87* 88*  CO2 34* 41*  GLUCOSE 104* 189*  BUN 14 16  CREATININE 0.56 0.53  CALCIUM 9.1 9.0   Liver Function Tests:  Recent Labs Lab 09/19/13 1258 09/20/13 0318  AST 40* 31  ALT 26 26  ALKPHOS 85 80  BILITOT 0.5 0.4  PROT 7.3 7.2  ALBUMIN 3.1* 3.0*   No results found for this basename: LIPASE, AMYLASE,  in the last 168 hours No results found for this basename: AMMONIA,  in the last 168 hours CBC:  Recent Labs Lab 09/19/13 1258 09/20/13 0318  WBC 6.2 3.9*  NEUTROABS 3.7  --   HGB 13.2 13.3  HCT 41.8 42.2  MCV 99.8 99.5  PLT 224 199   Cardiac Enzymes: No results found for this basename: CKTOTAL, CKMB, CKMBINDEX, TROPONINI,  in the last 168 hours BNP (last 3 results)  Recent Labs  06/25/13 1337 09/19/13 1258  PROBNP 3381.0* 4084.0*   CBG:  Recent Labs Lab 09/20/13 0822  GLUCAP 138*    Recent Results (from the past 240 hour(s))  CULTURE, BLOOD (ROUTINE X 2)     Status: None   Collection Time    09/19/13  1:03 PM      Result Value Ref Range Status   Specimen Description BLOOD RIGHT HAND   Final   Special Requests BOTTLES DRAWN AEROBIC AND ANAEROBIC 5ML   Final   Culture  Setup Time  Final   Value: 09/19/2013 16:13     Performed at [facility]   Culture     Final   Value:        BLOOD CULTURE RECEIVED NO GROWTH TO DATE CULTURE WILL BE HELD FOR 5 DAYS BEFORE ISSUING A FINAL NEGATIVE REPORT     Performed at [facility]   Report Status PENDING   Incomplete  CULTURE, BLOOD (ROUTINE X 2)     Status: None   Collection Time    09/19/13  1:54 PM      Result Value Ref Range Status   Specimen Description BLOOD RIGHT WRIST   Final   Special Requests BOTTLES DRAWN AEROBIC AND ANAEROBIC 5 CC EA   Final   Culture  Setup Time     Final   Value: 09/19/2013 21:20     Performed at  [facility]   Culture     Final   Value:        BLOOD CULTURE RECEIVED NO GROWTH TO DATE CULTURE WILL BE HELD FOR 5 DAYS BEFORE ISSUING A FINAL NEGATIVE REPORT     Performed at [facility]   Report Status PENDING   Incomplete  MRSA PCR SCREENING     Status: None   Collection Time    09/19/13  5:29 PM      Result Value Ref Range Status   MRSA by PCR NEGATIVE  NEGATIVE Final   Comment:            The GeneXpert MRSA Assay (FDA     approved for NASAL specimens     only), is one component of a     comprehensive MRSA colonization     surveillance program. It is not     intended to diagnose MRSA     infection nor to guide or     monitor treatment for     MRSA infections.     Studies: Dg Chest 2 View  09/20/2013   CLINICAL DATA:  Cough and congestion.  Hypoxia  EXAM: CHEST  2 VIEW  COMPARISON:  09/19/2013  FINDINGS: Cardiac enlargement without heart failure. Lungs remain clear without infiltrate or effusion.  IMPRESSION: No active cardiopulmonary disease.   Electronically Signed   By: Franchot Gallo M.D.   On: 09/20/2013 09:23   Dg Chest Portable 1 View  09/19/2013   CLINICAL DATA:  Shortness of Breath  EXAM: PORTABLE CHEST - 1 VIEW  COMPARISON:  August 23, 2013  FINDINGS: There is underlying emphysematous change. There is no edema consolidation. The heart is enlarged with normal pulmonary vascularity. No adenopathy. There is osteoarthritic change in both shoulders.  IMPRESSION: Cardiomegaly.  No edema or consolidation.  Underlying emphysema.   Electronically Signed   By: Lowella Grip M.D.   On: 09/19/2013 13:40    Scheduled Meds: . albuterol  5 mg Nebulization Q6H  . aztreonam  1 g Intravenous 3 times per day  . diltiazem  120 mg Oral Daily  . divalproex  125 mg Oral BID  . docusate sodium  100 mg Oral Daily  . furosemide  20 mg Intravenous Q12H  . ipratropium  0.5 mg Nebulization Q6H  . methylPREDNISolone sodium succinate  80 mg Intravenous Q6H  . [START ON  09/21/2013] Rivaroxaban  20 mg Oral Q supper  . rivaroxaban  20 mg Oral Once  . vancomycin  750 mg Intravenous Q12H   Continuous Infusions:  Antibiotics Given (last 72 hours)  Date/Time Action Medication Dose Rate   09/20/13 0200 Given   vancomycin (VANCOCIN) IVPB 750 mg/150 ml premix 750 mg 150 mL/hr   09/20/13 1032 Given   aztreonam (AZACTAM) 1 g in dextrose 5 % 50 mL IVPB 1 g 100 mL/hr      Active Problems:   Atrial fibrillation   Metabolic encephalopathy   Dementia   COPD with exacerbation   Acute respiratory failure    Time spent: 100min    Domenic Polite  [facility] Pager [phone]. If 7PM-7AM, please contact night-coverage at [email or web address], password [facility] 09/20/2013, 3:01 PM  LOS: 1 day

## 2013-09-20 NOTE — Progress Notes (Signed)
Clinical Social Work Department BRIEF PSYCHOSOCIAL ASSESSMENT 09/20/2013  Patient:  Jennifer Nielsen, Jennifer Nielsen     Account Number:  [id number]     Admit date:  09/19/2013  Clinical Social Worker:  Renold Genta  Date/Time:  09/20/2013 10:53 AM  Referred by:  Physician  Date Referred:  09/20/2013 Referred for  Other - See comment   Other Referral:   Admitted from: [facility]   Interview type:  Family Other interview type:   patient's daughter, Jennifer Nielsen via phone    PSYCHOSOCIAL DATA Living Status:  FACILITY Admitted from facility:  [name] of care:  [place] Primary support name:  Jennifer Nielsen (daughter) h#: [phone] c#: [phone] Primary support relationship to patient:  CHILD, ADULT Degree of support available:   good    CURRENT CONCERNS Current Concerns  Post-Acute Placement   Other Concerns:    SOCIAL WORK ASSESSMENT / PLAN CSW received consult that patient was admitted from [facility].   Assessment/plan status:  Information/Referral to [employer] Other assessment/ plan:   Information/referral to community resources:   CSW completed FL2 and sent information to [facility], confirmed with [facility] that they would be able to take patient back when ready.    PATIENT'S/FAMILY'S RESPONSE TO PLAN OF CARE: Patient's daughter informed CSW that patient was a long term resident there & that they've been pleased with [facility].       Jennifer Nielsen, [facility] Clinical Social Worker cell #: [phone]

## 2013-09-20 NOTE — Progress Notes (Signed)
Inpatient Diabetes Program Recommendations  AACE/ADA: New Consensus Statement on Inpatient Glycemic Control (2013)  Target Ranges:  Prepandial:   less than 140 mg/dL      Peak postprandial:   less than 180 mg/dL (1-2 hours)      Critically ill patients:  140 - 180 mg/dL   Results for ISAAC, LACSON (MRN [id number]) as of 09/20/2013 09:27  Ref. Range 09/19/2013 12:58 09/20/2013 03:18  Glucose Latest Range: 70-99 mg/dL 104 (H) 189 (H)    Diabetes history: No  Outpatient Diabetes medications: NA  Current orders for Inpatient glycemic control: NONE   Inpatient Diabetes Program Recommendations  Correction (SSI): While inpatient and on steroids, please consider ordering CBGs with Novolog correction scale ACHS.  HgbA1C: Please order an A1C to evaluate glycemic control over the past 2-3 months.   Thanks,  Barnie Alderman, RN, MSN, CCRN  Diabetes Coordinator  Inpatient Diabetes Program  [phone] (Team Pager)  [phone] ([facility] office)  [phone] [facility] office)

## 2013-09-20 NOTE — Progress Notes (Signed)
[id number] Rosana Hoes, RN, BSN, CCM  [phone]  Chart Reviewed for discharge and hospital needs.  Discharge needs at time of review: None present will follow for needs.  Review of patient progress due on [id number].

## 2013-09-21 DIAGNOSIS — J441 Chronic obstructive pulmonary disease with (acute) exacerbation: Secondary | ICD-10-CM | POA: Diagnosis not present

## 2013-09-21 DIAGNOSIS — J9600 Acute respiratory failure, unspecified whether with hypoxia or hypercapnia: Secondary | ICD-10-CM | POA: Diagnosis not present

## 2013-09-21 DIAGNOSIS — I4891 Unspecified atrial fibrillation: Secondary | ICD-10-CM | POA: Diagnosis not present

## 2013-09-21 LAB — CBC
HEMATOCRIT: 40.3 % (ref 36.0–46.0)
HEMOGLOBIN: 12.8 g/dL (ref 12.0–15.0)
MCH: 30.8 pg (ref 26.0–34.0)
MCHC: 31.8 g/dL (ref 30.0–36.0)
MCV: 97.1 fL (ref 78.0–100.0)
Platelets: 208 10*3/uL (ref 150–400)
RBC: 4.15 MIL/uL (ref 3.87–5.11)
RDW: 15.5 % (ref 11.5–15.5)
WBC: 8.8 10*3/uL (ref 4.0–10.5)

## 2013-09-21 LAB — BASIC METABOLIC PANEL
BUN: 25 mg/dL — AB (ref 6–23)
CALCIUM: 9 mg/dL (ref 8.4–10.5)
CO2: 42 mEq/L (ref 19–32)
CREATININE: 0.5 mg/dL (ref 0.50–1.10)
Chloride: 87 mEq/L — ABNORMAL LOW (ref 96–112)
GFR, EST NON AFRICAN AMERICAN: 85 mL/min — AB (ref >90)
GLUCOSE: 167 mg/dL — AB (ref 70–99)
Potassium: 4.4 mEq/L (ref 3.7–5.3)
Sodium: 137 mEq/L (ref 137–147)

## 2013-09-21 MED ORDER — POLYVINYL ALCOHOL 1.4 % OP SOLN
1.0000 [drp] | OPHTHALMIC | Status: DC | PRN
  Administered 2013-09-21: 1 [drp] via OPHTHALMIC
  Filled 2013-09-21: qty 15

## 2013-09-21 MED ORDER — METHYLPREDNISOLONE SODIUM SUCC 125 MG IJ SOLR
40.0000 mg | Freq: Two times a day (BID) | INTRAMUSCULAR | Status: DC
  Administered 2013-09-21: 40 mg via INTRAVENOUS
  Filled 2013-09-21 (×3): qty 0.64

## 2013-09-21 MED ORDER — FUROSEMIDE 20 MG PO TABS
20.0000 mg | ORAL_TABLET | Freq: Two times a day (BID) | ORAL | Status: DC
  Administered 2013-09-21 – 2013-09-25 (×8): 20 mg via ORAL
  Filled 2013-09-21 (×10): qty 1

## 2013-09-21 NOTE — Progress Notes (Signed)
CRITICAL VALUE ALERT  Critical value received:  CO2 42  Date of notification:  09/21/13   Time of notification:  0500  Critical value read back:yes  Nurse who received alert:  Manuela Neptune  MD notified (1st page):  [facility] paged  Time of first page:  0501

## 2013-09-21 NOTE — Progress Notes (Signed)
[facility] PROGRESS NOTE  Jennifer Nielsen GGY:[id number] DOB: [birth year] DOA: 09/19/2013 PCP: [name], MD  Assessment/Plan: Acute respiratory failure  -Improving -suspect multifactorial, COPD exacerbation, ? Developing/Early pneumonia, likely has undiagnosed OSA/OHS too  -severe congestion and some wheezing on admission -severe Resp acidosis on admission, improved on BIPAP -DC BIPAP, QHS PRN  -continue IV solumedrol, cut down dose, nebs  -Empiric Abx-stop Vanc, continue Aztreonam  -repeat CXR unchanged -D/w PCCM Dr.Byrum on admission   COPD with exacerbation  -see above   Atrial fibrillation  -rate controlled -continue diltiazem/xarelto  -Dig level slightly elevated on admission, improved with holding dose, resume dig every other day  Metabolic encephalopathy  -due to acute illness, CO2 narcosis -improved  Dementia  -daughters surprised to hear this but clearly documented in previous hospital and office notes  -continue depakote sprinkles, hold olanzepine   Chronic diastolic CHF  -very difficult to determine volume status given body habitus, no overt heart failure, but BNP slighty up compared to recent level  -stable, change lasix to PO  DVt proph: on xarelto chronically   Code Status: DNR  Family Communication: discussed with both daughters at bedside  Disposition Plan: transfer to tele    Consultants:  D/w PCCM  HPI/Subjective: Feels better, breathing better  Objective: Filed Vitals:   09/21/13 1100  BP:   Pulse: 79  Temp: 98.1 F (36.7 C)  Resp: 33    Intake/Output Summary (Last 24 hours) at 09/21/13 1306 Last data filed at 09/21/13 0615  Gross per 24 hour  Intake    540 ml  Output   1400 ml  Net   -860 ml   Filed Weights   09/19/13 1800  Weight: 95 kg (209 lb 7 oz)    Exam:   General:  AAOx to self and place  Cardiovascular: S1S2/RRR  Respiratory: improved air movement  Abdomen: soft, Obese, Nt, BS  present  Musculoskeletal:no edema c/c  Data Reviewed: Basic Metabolic Panel:  Recent Labs Lab 09/19/13 1258 09/20/13 0318 09/21/13 0354  NA 135* 137 137  K 4.9 4.6 4.4  CL 87* 88* 87*  CO2 34* 41* 42*  GLUCOSE 104* 189* 167*  BUN 14 16 25*  CREATININE 0.56 0.53 0.50  CALCIUM 9.1 9.0 9.0   Liver Function Tests:  Recent Labs Lab 09/19/13 1258 09/20/13 0318  AST 40* 31  ALT 26 26  ALKPHOS 85 80  BILITOT 0.5 0.4  PROT 7.3 7.2  ALBUMIN 3.1* 3.0*   No results found for this basename: LIPASE, AMYLASE,  in the last 168 hours No results found for this basename: AMMONIA,  in the last 168 hours CBC:  Recent Labs Lab 09/19/13 1258 09/20/13 0318 09/21/13 0354  WBC 6.2 3.9* 8.8  NEUTROABS 3.7  --   --   HGB 13.2 13.3 12.8  HCT 41.8 42.2 40.3  MCV 99.8 99.5 97.1  PLT 224 199 208   Cardiac Enzymes: No results found for this basename: CKTOTAL, CKMB, CKMBINDEX, TROPONINI,  in the last 168 hours BNP (last 3 results)  Recent Labs  06/25/13 1337 09/19/13 1258  PROBNP 3381.0* 4084.0*   CBG:  Recent Labs Lab 09/20/13 0822  GLUCAP 138*    Recent Results (from the past 240 hour(s))  CULTURE, BLOOD (ROUTINE X 2)     Status: None   Collection Time    09/19/13  1:03 PM      Result Value Ref Range Status   Specimen Description BLOOD RIGHT HAND   Final   Special  Requests BOTTLES DRAWN AEROBIC AND ANAEROBIC 5ML   Final   Culture  Setup Time     Final   Value: 09/19/2013 16:13     Performed at [facility]   Culture     Final   Value:        BLOOD CULTURE RECEIVED NO GROWTH TO DATE CULTURE WILL BE HELD FOR 5 DAYS BEFORE ISSUING A FINAL NEGATIVE REPORT     Performed at [facility]   Report Status PENDING   Incomplete  CULTURE, BLOOD (ROUTINE X 2)     Status: None   Collection Time    09/19/13  1:54 PM      Result Value Ref Range Status   Specimen Description BLOOD RIGHT WRIST   Final   Special Requests BOTTLES DRAWN AEROBIC AND ANAEROBIC 5 CC EA    Final   Culture  Setup Time     Final   Value: 09/19/2013 21:20     Performed at [facility]   Culture     Final   Value:        BLOOD CULTURE RECEIVED NO GROWTH TO DATE CULTURE WILL BE HELD FOR 5 DAYS BEFORE ISSUING A FINAL NEGATIVE REPORT     Performed at [facility]   Report Status PENDING   Incomplete  URINE CULTURE     Status: None   Collection Time    09/19/13  2:01 PM      Result Value Ref Range Status   Specimen Description URINE, CATHETERIZED   Final   Special Requests Normal   Final   Culture  Setup Time     Final   Value: 09/19/2013 21:35     Performed at [place]     Final   Value: 50,000 COLONIES/ML     Performed at [facility]   Culture     Final   Value: Multiple bacterial morphotypes present, none predominant. Suggest appropriate recollection if clinically indicated.     Performed at [facility]   Report Status 09/20/2013 FINAL   Final  MRSA PCR SCREENING     Status: None   Collection Time    09/19/13  5:29 PM      Result Value Ref Range Status   MRSA by PCR NEGATIVE  NEGATIVE Final   Comment:            The GeneXpert MRSA Assay (FDA     approved for NASAL specimens     only), is one component of a     comprehensive MRSA colonization     surveillance program. It is not     intended to diagnose MRSA     infection nor to guide or     monitor treatment for     MRSA infections.     Studies: Dg Chest 2 View  09/20/2013   CLINICAL DATA:  Cough and congestion.  Hypoxia  EXAM: CHEST  2 VIEW  COMPARISON:  09/19/2013  FINDINGS: Cardiac enlargement without heart failure. Lungs remain clear without infiltrate or effusion.  IMPRESSION: No active cardiopulmonary disease.   Electronically Signed   By: Franchot Gallo M.D.   On: 09/20/2013 09:23   Dg Chest Portable 1 View  09/19/2013   CLINICAL DATA:  Shortness of Breath  EXAM: PORTABLE CHEST - 1 VIEW  COMPARISON:  August 23, 2013  FINDINGS: There is underlying  emphysematous change. There is no edema consolidation. The heart  is enlarged with normal pulmonary vascularity. No adenopathy. There is osteoarthritic change in both shoulders.  IMPRESSION: Cardiomegaly.  No edema or consolidation.  Underlying emphysema.   Electronically Signed   By: Lowella Grip M.D.   On: 09/19/2013 13:40    Scheduled Meds: . albuterol  2.5 mg Nebulization Q6H  . aztreonam  1 g Intravenous 3 times per day  . digoxin  0.125 mg Oral Q T,Th,S,Su  . diltiazem  120 mg Oral Daily  . divalproex  125 mg Oral BID  . docusate sodium  100 mg Oral Daily  . furosemide  20 mg Intravenous Q12H  . ipratropium-albuterol  3 mL Nebulization Q6H  . methylPREDNISolone sodium succinate  60 mg Intravenous Q6H  . Rivaroxaban  20 mg Oral Q supper  . vancomycin  750 mg Intravenous Q12H   Continuous Infusions:  Antibiotics Given (last 72 hours)   Date/Time Action Medication Dose Rate   09/20/13 0200 Given   vancomycin (VANCOCIN) IVPB 750 mg/150 ml premix 750 mg 150 mL/hr   09/20/13 1032 Given   aztreonam (AZACTAM) 1 g in dextrose 5 % 50 mL IVPB 1 g 100 mL/hr   09/20/13 1543 Given   vancomycin (VANCOCIN) IVPB 750 mg/150 ml premix 750 mg 150 mL/hr   09/20/13 1543 Given   aztreonam (AZACTAM) 1 g in dextrose 5 % 50 mL IVPB 1 g 100 mL/hr   09/20/13 2134 Given   aztreonam (AZACTAM) 1 g in dextrose 5 % 50 mL IVPB 1 g 100 mL/hr   09/21/13 0159 Given   vancomycin (VANCOCIN) IVPB 750 mg/150 ml premix 750 mg 150 mL/hr   09/21/13 0615 Given   aztreonam (AZACTAM) 1 g in dextrose 5 % 50 mL IVPB 1 g 100 mL/hr      Active Problems:   Atrial fibrillation   Metabolic encephalopathy   Dementia   COPD with exacerbation   Acute respiratory failure    Time spent: 37min    Domenic Polite  [facility] Pager [phone]. If 7PM-7AM, please contact night-coverage at [email or web address], password [facility] 09/21/2013, 1:06 PM  LOS: 2 days

## 2013-09-22 DIAGNOSIS — J441 Chronic obstructive pulmonary disease with (acute) exacerbation: Secondary | ICD-10-CM | POA: Diagnosis not present

## 2013-09-22 DIAGNOSIS — J9600 Acute respiratory failure, unspecified whether with hypoxia or hypercapnia: Secondary | ICD-10-CM | POA: Diagnosis not present

## 2013-09-22 DIAGNOSIS — I4891 Unspecified atrial fibrillation: Secondary | ICD-10-CM | POA: Diagnosis not present

## 2013-09-22 LAB — BASIC METABOLIC PANEL
BUN: 28 mg/dL — ABNORMAL HIGH (ref 6–23)
CO2: 38 mEq/L — ABNORMAL HIGH (ref 19–32)
Calcium: 9 mg/dL (ref 8.4–10.5)
Chloride: 87 mEq/L — ABNORMAL LOW (ref 96–112)
Creatinine, Ser: 0.47 mg/dL — ABNORMAL LOW (ref 0.50–1.10)
GFR, EST NON AFRICAN AMERICAN: 86 mL/min — AB (ref >90)
Glucose, Bld: 138 mg/dL — ABNORMAL HIGH (ref 70–99)
POTASSIUM: 4.5 meq/L (ref 3.7–5.3)
SODIUM: 137 meq/L (ref 137–147)

## 2013-09-22 LAB — CBC
HCT: 40.2 % (ref 36.0–46.0)
Hemoglobin: 12.7 g/dL (ref 12.0–15.0)
MCH: 31 pg (ref 26.0–34.0)
MCHC: 31.6 g/dL (ref 30.0–36.0)
MCV: 98 fL (ref 78.0–100.0)
PLATELETS: 222 10*3/uL (ref 150–400)
RBC: 4.1 MIL/uL (ref 3.87–5.11)
RDW: 15.7 % — AB (ref 11.5–15.5)
WBC: 8.6 10*3/uL (ref 4.0–10.5)

## 2013-09-22 MED ORDER — PREDNISONE 50 MG PO TABS
50.0000 mg | ORAL_TABLET | Freq: Every day | ORAL | Status: DC
  Administered 2013-09-23 – 2013-09-24 (×2): 50 mg via ORAL
  Filled 2013-09-22 (×3): qty 1

## 2013-09-22 MED ORDER — METHYLPREDNISOLONE SODIUM SUCC 125 MG IJ SOLR
40.0000 mg | Freq: Two times a day (BID) | INTRAMUSCULAR | Status: AC
  Administered 2013-09-22 (×2): 40 mg via INTRAVENOUS
  Filled 2013-09-22 (×2): qty 0.64

## 2013-09-22 MED ORDER — ALBUTEROL SULFATE (2.5 MG/3ML) 0.083% IN NEBU: 2.5000 mg | INHALATION_SOLUTION | RESPIRATORY_TRACT | Status: DC | PRN

## 2013-09-22 NOTE — Progress Notes (Signed)
PT Cancellation Note  Patient Details Name: Jennifer Nielsen MRN: [id number] DOB: 1925-05-21   Cancelled Treatment:    Reason Eval/Treat Not Completed: PT screened, no needs identified, will sign off.  Pt states she is not to stand or walk due to osteoporosis, that she uses a lift at all times at [facility].  Will defer PT services to [facility] place at d/c.   Kennith Gain 09/22/2013, 1:40 PM

## 2013-09-22 NOTE — Progress Notes (Signed)
[facility] PROGRESS NOTE  Jennifer Nielsen ZOX:[id number] DOB: 10-11-1924 DOA: 09/19/2013 PCP: [name], MD  Assessment/Plan: Acute respiratory failure  -Improving -suspect multifactorial, COPD exacerbation, ? Developing/Early pneumonia, likely has undiagnosed OSA/OHS too  -severe congestion and some wheezing on admission -severe Resp acidosis on admission, improved on BIPAP -off BIPAP, QHS PRN  -cut down IV solumedrol, change to Prednisone in am, nebs  -Empiric Abx-stopped Vanc, continue Aztreonam Day3, change to Doxycycline  In am, allergic to Quinolones and PCN -repeat CXR unchanged -D/w PCCM Dr.Byrum on admission   COPD with exacerbation  -see above   Atrial fibrillation  -rate controlled -continue diltiazem/xarelto  -Dig level slightly elevated on admission, improved with holding dose, resume dig every other day  Metabolic encephalopathy  -due to acute illness, CO2 narcosis -improved  Dementia  -daughters surprised to hear this but clearly documented in previous hospital and office notes  -continue depakote sprinkles, hold olanzepine   Chronic diastolic CHF  -very difficult to determine volume status given body habitus, no overt heart failure, but BNP slighty up compared to recent level  -stable, continue  lasix PO  DVt proph: on xarelto chronically   Code Status: DNR  Family Communication: discussed with both daughters at bedside yesterday  Disposition Plan: keep on tele    Consultants:  D/w PCCM  HPI/Subjective: Feels better, breathing better  Objective: Filed Vitals:   09/22/13 0438  BP: 143/64  Pulse: 78  Temp: 97.7 F (36.5 C)  Resp: 24    Intake/Output Summary (Last 24 hours) at 09/22/13 0848 Last data filed at 09/22/13 0626  Gross per 24 hour  Intake    390 ml  Output   1690 ml  Net  -1300 ml   Filed Weights   09/19/13 1800 09/22/13 0700  Weight: 95 kg (209 lb 7 oz) 91.4 kg (201 lb 8 oz)    Exam:   General:  AAOx to self and  place  Cardiovascular: S1S2/RRR  Respiratory: improved air movement  Abdomen: soft, Obese, Nt, BS present  Musculoskeletal:no edema c/c  Data Reviewed: Basic Metabolic Panel:  Recent Labs Lab 09/19/13 1258 09/20/13 0318 09/21/13 0354 09/22/13 0435  NA 135* 137 137 137  K 4.9 4.6 4.4 4.5  CL 87* 88* 87* 87*  CO2 34* 41* 42* 38*  GLUCOSE 104* 189* 167* 138*  BUN 14 16 25* 28*  CREATININE 0.56 0.53 0.50 0.47*  CALCIUM 9.1 9.0 9.0 9.0   Liver Function Tests:  Recent Labs Lab 09/19/13 1258 09/20/13 0318  AST 40* 31  ALT 26 26  ALKPHOS 85 80  BILITOT 0.5 0.4  PROT 7.3 7.2  ALBUMIN 3.1* 3.0*   No results found for this basename: LIPASE, AMYLASE,  in the last 168 hours No results found for this basename: AMMONIA,  in the last 168 hours CBC:  Recent Labs Lab 09/19/13 1258 09/20/13 0318 09/21/13 0354 09/22/13 0435  WBC 6.2 3.9* 8.8 8.6  NEUTROABS 3.7  --   --   --   HGB 13.2 13.3 12.8 12.7  HCT 41.8 42.2 40.3 40.2  MCV 99.8 99.5 97.1 98.0  PLT 224 199 208 222   Cardiac Enzymes: No results found for this basename: CKTOTAL, CKMB, CKMBINDEX, TROPONINI,  in the last 168 hours BNP (last 3 results)  Recent Labs  06/25/13 1337 09/19/13 1258  PROBNP 3381.0* 4084.0*   CBG:  Recent Labs Lab 09/20/13 0822  GLUCAP 138*    Recent Results (from the past 240 hour(s))  CULTURE, BLOOD (ROUTINE  X 2)     Status: None   Collection Time    09/19/13  1:03 PM      Result Value Ref Range Status   Specimen Description BLOOD RIGHT HAND   Final   Special Requests BOTTLES DRAWN AEROBIC AND ANAEROBIC 5ML   Final   Culture  Setup Time     Final   Value: 09/19/2013 16:13     Performed at [facility]   Culture     Final   Value:        BLOOD CULTURE RECEIVED NO GROWTH TO DATE CULTURE WILL BE HELD FOR 5 DAYS BEFORE ISSUING A FINAL NEGATIVE REPORT     Performed at [facility]   Report Status PENDING   Incomplete  CULTURE, BLOOD (ROUTINE X 2)     Status:  None   Collection Time    09/19/13  1:54 PM      Result Value Ref Range Status   Specimen Description BLOOD RIGHT WRIST   Final   Special Requests BOTTLES DRAWN AEROBIC AND ANAEROBIC 5 CC EA   Final   Culture  Setup Time     Final   Value: 09/19/2013 21:20     Performed at [facility]   Culture     Final   Value:        BLOOD CULTURE RECEIVED NO GROWTH TO DATE CULTURE WILL BE HELD FOR 5 DAYS BEFORE ISSUING A FINAL NEGATIVE REPORT     Performed at [facility]   Report Status PENDING   Incomplete  URINE CULTURE     Status: None   Collection Time    09/19/13  2:01 PM      Result Value Ref Range Status   Specimen Description URINE, CATHETERIZED   Final   Special Requests Normal   Final   Culture  Setup Time     Final   Value: 09/19/2013 21:35     Performed at [place]     Final   Value: 50,000 COLONIES/ML     Performed at [facility]   Culture     Final   Value: Multiple bacterial morphotypes present, none predominant. Suggest appropriate recollection if clinically indicated.     Performed at [facility]   Report Status 09/20/2013 FINAL   Final  MRSA PCR SCREENING     Status: None   Collection Time    09/19/13  5:29 PM      Result Value Ref Range Status   MRSA by PCR NEGATIVE  NEGATIVE Final   Comment:            The GeneXpert MRSA Assay (FDA     approved for NASAL specimens     only), is one component of a     comprehensive MRSA colonization     surveillance program. It is not     intended to diagnose MRSA     infection nor to guide or     monitor treatment for     MRSA infections.     Studies: Dg Chest 2 View  09/20/2013   CLINICAL DATA:  Cough and congestion.  Hypoxia  EXAM: CHEST  2 VIEW  COMPARISON:  09/19/2013  FINDINGS: Cardiac enlargement without heart failure. Lungs remain clear without infiltrate or effusion.  IMPRESSION: No active cardiopulmonary disease.   Electronically Signed   By: Franchot Gallo  M.D.   On: 09/20/2013 09:23  Scheduled Meds: . aztreonam  1 g Intravenous 3 times per day  . digoxin  0.125 mg Oral Q T,Th,S,Su  . diltiazem  120 mg Oral Daily  . divalproex  125 mg Oral BID  . docusate sodium  100 mg Oral Daily  . furosemide  20 mg Oral BID  . ipratropium-albuterol  3 mL Nebulization Q6H  . methylPREDNISolone sodium succinate  40 mg Intravenous Q12H  . [START ON 09/23/2013] predniSONE  50 mg Oral Q breakfast  . Rivaroxaban  20 mg Oral Q supper   Continuous Infusions:  Antibiotics Given (last 72 hours)   Date/Time Action Medication Dose Rate   09/20/13 0200 Given   vancomycin (VANCOCIN) IVPB 750 mg/150 ml premix 750 mg 150 mL/hr   09/20/13 1032 Given   aztreonam (AZACTAM) 1 g in dextrose 5 % 50 mL IVPB 1 g 100 mL/hr   09/20/13 1543 Given   vancomycin (VANCOCIN) IVPB 750 mg/150 ml premix 750 mg 150 mL/hr   09/20/13 1543 Given   aztreonam (AZACTAM) 1 g in dextrose 5 % 50 mL IVPB 1 g 100 mL/hr   09/20/13 2134 Given   aztreonam (AZACTAM) 1 g in dextrose 5 % 50 mL IVPB 1 g 100 mL/hr   09/21/13 0159 Given   vancomycin (VANCOCIN) IVPB 750 mg/150 ml premix 750 mg 150 mL/hr   09/21/13 0615 Given   aztreonam (AZACTAM) 1 g in dextrose 5 % 50 mL IVPB 1 g 100 mL/hr   09/21/13 1442 Given   aztreonam (AZACTAM) 1 g in dextrose 5 % 50 mL IVPB 1 g 100 mL/hr   09/21/13 2242 Given   aztreonam (AZACTAM) 1 g in dextrose 5 % 50 mL IVPB 1 g 100 mL/hr   09/22/13 6389 Given   aztreonam (AZACTAM) 1 g in dextrose 5 % 50 mL IVPB 1 g 100 mL/hr      Active Problems:   Atrial fibrillation   Metabolic encephalopathy   Dementia   COPD with exacerbation   Acute respiratory failure    Time spent: 80min    Domenic Polite  [facility] Pager [phone]. If 7PM-7AM, please contact night-coverage at [email or web address], password [facility] 09/22/2013, 8:48 AM  LOS: 3 days

## 2013-09-23 DIAGNOSIS — I509 Heart failure, unspecified: Secondary | ICD-10-CM | POA: Diagnosis not present

## 2013-09-23 DIAGNOSIS — J9600 Acute respiratory failure, unspecified whether with hypoxia or hypercapnia: Secondary | ICD-10-CM | POA: Diagnosis not present

## 2013-09-23 DIAGNOSIS — I4891 Unspecified atrial fibrillation: Secondary | ICD-10-CM | POA: Diagnosis not present

## 2013-09-23 DIAGNOSIS — J441 Chronic obstructive pulmonary disease with (acute) exacerbation: Secondary | ICD-10-CM | POA: Diagnosis not present

## 2013-09-23 LAB — BASIC METABOLIC PANEL
BUN: 26 mg/dL — ABNORMAL HIGH (ref 6–23)
CHLORIDE: 89 meq/L — AB (ref 96–112)
CO2: 42 mEq/L (ref 19–32)
Calcium: 8.7 mg/dL (ref 8.4–10.5)
Creatinine, Ser: 0.49 mg/dL — ABNORMAL LOW (ref 0.50–1.10)
GFR calc Af Amer: >90 mL/min (ref >90)
GFR calc non Af Amer: 85 mL/min — ABNORMAL LOW (ref >90)
GLUCOSE: 166 mg/dL — AB (ref 70–99)
POTASSIUM: 4.8 meq/L (ref 3.7–5.3)
Sodium: 137 mEq/L (ref 137–147)

## 2013-09-23 NOTE — Progress Notes (Signed)
CRITICAL VALUE ALERT  Critical value received:  CO2- 42  Date of notification:  09/23/13  Time of notification:  0638  Critical value read back:yes  Nurse who received alert:  Edilia Bo  MD notified (1st page):  Fredirick Maudlin, NP  Time of first page:  [phone]  MD notified (2nd page):  Time of second page:  Responding MD:  Fredirick Maudlin, NP   Time MD responded:  [phone]   No new orders received, will continue to monitor

## 2013-09-23 NOTE — Progress Notes (Signed)
[facility] PROGRESS NOTE  Jennifer Nielsen GEX:[id number] DOB: Oct 02, 1924 DOA: 09/19/2013 PCP: [name], MD  Assessment/Plan: Acute respiratory failure  -Improving -suspect multifactorial, COPD exacerbation, ? Developing/Early pneumonia, likely has undiagnosed OSA/OHS too  -severe congestion and some wheezing on admission -severe Resp acidosis on admission, improved on BIPAP -off BIPAP, QHS PRN  -cut down IV solumedrol, changed to Prednisone, nebs  -Empiric Abx-stopped Vanc, s/p 4 days of  Aztreonam, change to PO Doxycycline tomorrow, allergic to Quinolones and PCN -repeat CXR unchanged -D/w PCCM Dr.Byrum on admission  -concern for aspiration, lot of coughing while eating, will check swallowing  COPD with exacerbation  -see above   Atrial fibrillation  -rate controlled -continue diltiazem/xarelto  -Dig level slightly elevated on admission, improved with holding dose, resume dig every other day  Metabolic encephalopathy  -due to acute illness, CO2 narcosis -improved  Dementia  -daughters surprised to hear this but clearly documented in previous hospital and office notes  -continue depakote sprinkles, hold olanzepine   Chronic diastolic CHF  -very difficult to determine volume status given body habitus, no overt heart failure, but BNP slighty up compared to recent level  -stable, continue  lasix PO  DVt proph: on xarelto chronically   Code Status: DNR  Family Communication: discussed with both daughters at bedside 4/10, none at bedside  Disposition Plan: SNF when stable   Consultants:  D/w PCCM  HPI/Subjective: Feels better, breathing better, coughing a lot  Objective: Filed Vitals:   09/23/13 0427  BP: 148/69  Pulse: 73  Temp: 97.5 F (36.4 C)  Resp: 20    Intake/Output Summary (Last 24 hours) at 09/23/13 0759 Last data filed at 09/22/13 1752  Gross per 24 hour  Intake    890 ml  Output      0 ml  Net    890 ml   Filed Weights   09/19/13 1800  09/22/13 0700 09/23/13 0427  Weight: 95 kg (209 lb 7 oz) 91.4 kg (201 lb 8 oz) 91.2 kg (201 lb 1 oz)    Exam:   General:  AAOx to self and place  Cardiovascular: S1S2/RRR  Respiratory: improved air movement, scattered ronchi  Abdomen: soft, Obese, Nt, BS present  Musculoskeletal:no edema c/c  Data Reviewed: Basic Metabolic Panel:  Recent Labs Lab 09/19/13 1258 09/20/13 0318 09/21/13 0354 09/22/13 0435 09/23/13 0506  NA 135* 137 137 137 137  K 4.9 4.6 4.4 4.5 4.8  CL 87* 88* 87* 87* 89*  CO2 34* 41* 42* 38* 42*  GLUCOSE 104* 189* 167* 138* 166*  BUN 14 16 25* 28* 26*  CREATININE 0.56 0.53 0.50 0.47* 0.49*  CALCIUM 9.1 9.0 9.0 9.0 8.7   Liver Function Tests:  Recent Labs Lab 09/19/13 1258 09/20/13 0318  AST 40* 31  ALT 26 26  ALKPHOS 85 80  BILITOT 0.5 0.4  PROT 7.3 7.2  ALBUMIN 3.1* 3.0*   No results found for this basename: LIPASE, AMYLASE,  in the last 168 hours No results found for this basename: AMMONIA,  in the last 168 hours CBC:  Recent Labs Lab 09/19/13 1258 09/20/13 0318 09/21/13 0354 09/22/13 0435  WBC 6.2 3.9* 8.8 8.6  NEUTROABS 3.7  --   --   --   HGB 13.2 13.3 12.8 12.7  HCT 41.8 42.2 40.3 40.2  MCV 99.8 99.5 97.1 98.0  PLT 224 199 208 222   Cardiac Enzymes: No results found for this basename: CKTOTAL, CKMB, CKMBINDEX, TROPONINI,  in the last 168 hours BNP (  last 3 results)  Recent Labs  06/25/13 1337 09/19/13 1258  PROBNP 3381.0* 4084.0*   CBG:  Recent Labs Lab 09/20/13 0822  GLUCAP 138*    Recent Results (from the past 240 hour(s))  CULTURE, BLOOD (ROUTINE X 2)     Status: None   Collection Time    09/19/13  1:03 PM      Result Value Ref Range Status   Specimen Description BLOOD RIGHT HAND   Final   Special Requests BOTTLES DRAWN AEROBIC AND ANAEROBIC 5ML   Final   Culture  Setup Time     Final   Value: 09/19/2013 16:13     Performed at [facility]   Culture     Final   Value:        BLOOD CULTURE  RECEIVED NO GROWTH TO DATE CULTURE WILL BE HELD FOR 5 DAYS BEFORE ISSUING A FINAL NEGATIVE REPORT     Performed at [facility]   Report Status PENDING   Incomplete  CULTURE, BLOOD (ROUTINE X 2)     Status: None   Collection Time    09/19/13  1:54 PM      Result Value Ref Range Status   Specimen Description BLOOD RIGHT WRIST   Final   Special Requests BOTTLES DRAWN AEROBIC AND ANAEROBIC 5 CC EA   Final   Culture  Setup Time     Final   Value: 09/19/2013 21:20     Performed at [facility]   Culture     Final   Value:        BLOOD CULTURE RECEIVED NO GROWTH TO DATE CULTURE WILL BE HELD FOR 5 DAYS BEFORE ISSUING A FINAL NEGATIVE REPORT     Performed at [facility]   Report Status PENDING   Incomplete  URINE CULTURE     Status: None   Collection Time    09/19/13  2:01 PM      Result Value Ref Range Status   Specimen Description URINE, CATHETERIZED   Final   Special Requests Normal   Final   Culture  Setup Time     Final   Value: 09/19/2013 21:35     Performed at [place]     Final   Value: 50,000 COLONIES/ML     Performed at [facility]   Culture     Final   Value: Multiple bacterial morphotypes present, none predominant. Suggest appropriate recollection if clinically indicated.     Performed at [facility]   Report Status 09/20/2013 FINAL   Final  MRSA PCR SCREENING     Status: None   Collection Time    09/19/13  5:29 PM      Result Value Ref Range Status   MRSA by PCR NEGATIVE  NEGATIVE Final   Comment:            The GeneXpert MRSA Assay (FDA     approved for NASAL specimens     only), is one component of a     comprehensive MRSA colonization     surveillance program. It is not     intended to diagnose MRSA     infection nor to guide or     monitor treatment for     MRSA infections.     Studies: No results found.  Scheduled Meds: . aztreonam  1 g Intravenous 3 times per day  . digoxin  0.125 mg  Oral  Q T,Th,S,Su  . diltiazem  120 mg Oral Daily  . divalproex  125 mg Oral BID  . docusate sodium  100 mg Oral Daily  . furosemide  20 mg Oral BID  . ipratropium-albuterol  3 mL Nebulization Q6H  . predniSONE  50 mg Oral Q breakfast  . Rivaroxaban  20 mg Oral Q supper   Continuous Infusions:  Antibiotics Given (last 72 hours)   Date/Time Action Medication Dose Rate   09/20/13 1032 Given   aztreonam (AZACTAM) 1 g in dextrose 5 % 50 mL IVPB 1 g 100 mL/hr   09/20/13 1543 Given   vancomycin (VANCOCIN) IVPB 750 mg/150 ml premix 750 mg 150 mL/hr   09/20/13 1543 Given   aztreonam (AZACTAM) 1 g in dextrose 5 % 50 mL IVPB 1 g 100 mL/hr   09/20/13 2134 Given   aztreonam (AZACTAM) 1 g in dextrose 5 % 50 mL IVPB 1 g 100 mL/hr   09/21/13 0159 Given   vancomycin (VANCOCIN) IVPB 750 mg/150 ml premix 750 mg 150 mL/hr   09/21/13 0615 Given   aztreonam (AZACTAM) 1 g in dextrose 5 % 50 mL IVPB 1 g 100 mL/hr   09/21/13 1442 Given   aztreonam (AZACTAM) 1 g in dextrose 5 % 50 mL IVPB 1 g 100 mL/hr   09/21/13 2242 Given   aztreonam (AZACTAM) 1 g in dextrose 5 % 50 mL IVPB 1 g 100 mL/hr   09/22/13 0626 Given   aztreonam (AZACTAM) 1 g in dextrose 5 % 50 mL IVPB 1 g 100 mL/hr   09/22/13 1319 Given   aztreonam (AZACTAM) 1 g in dextrose 5 % 50 mL IVPB 1 g 100 mL/hr   09/22/13 2158 Given   aztreonam (AZACTAM) 1 g in dextrose 5 % 50 mL IVPB 1 g 100 mL/hr   09/23/13 0525 Given   aztreonam (AZACTAM) 1 g in dextrose 5 % 50 mL IVPB 1 g 100 mL/hr      Active Problems:   Atrial fibrillation   Metabolic encephalopathy   Dementia   COPD with exacerbation   Acute respiratory failure    Time spent: 68min    Domenic Polite  [facility] Pager [phone]. If 7PM-7AM, please contact night-coverage at [email or web address], password [facility] 09/23/2013, 7:59 AM  LOS: 4 days

## 2013-09-24 DIAGNOSIS — J441 Chronic obstructive pulmonary disease with (acute) exacerbation: Secondary | ICD-10-CM | POA: Diagnosis not present

## 2013-09-24 DIAGNOSIS — J9600 Acute respiratory failure, unspecified whether with hypoxia or hypercapnia: Secondary | ICD-10-CM | POA: Diagnosis not present

## 2013-09-24 DIAGNOSIS — I4891 Unspecified atrial fibrillation: Secondary | ICD-10-CM | POA: Diagnosis not present

## 2013-09-24 DIAGNOSIS — R609 Edema, unspecified: Secondary | ICD-10-CM

## 2013-09-24 DIAGNOSIS — F0390 Unspecified dementia without behavioral disturbance: Secondary | ICD-10-CM

## 2013-09-24 LAB — BASIC METABOLIC PANEL
BUN: 27 mg/dL — ABNORMAL HIGH (ref 6–23)
CALCIUM: 8.5 mg/dL (ref 8.4–10.5)
CO2: 41 meq/L — AB (ref 19–32)
Chloride: 90 mEq/L — ABNORMAL LOW (ref 96–112)
Creatinine, Ser: 0.51 mg/dL (ref 0.50–1.10)
GFR calc Af Amer: >90 mL/min (ref >90)
GFR calc non Af Amer: 84 mL/min — ABNORMAL LOW (ref >90)
GLUCOSE: 131 mg/dL — AB (ref 70–99)
POTASSIUM: 4.1 meq/L (ref 3.7–5.3)
Sodium: 137 mEq/L (ref 137–147)

## 2013-09-24 LAB — CBC
HCT: 39.2 % (ref 36.0–46.0)
HEMOGLOBIN: 12.2 g/dL (ref 12.0–15.0)
MCH: 30.7 pg (ref 26.0–34.0)
MCHC: 31.1 g/dL (ref 30.0–36.0)
MCV: 98.5 fL (ref 78.0–100.0)
Platelets: 204 10*3/uL (ref 150–400)
RBC: 3.98 MIL/uL (ref 3.87–5.11)
RDW: 15.7 % — ABNORMAL HIGH (ref 11.5–15.5)
WBC: 8.4 10*3/uL (ref 4.0–10.5)

## 2013-09-24 MED ORDER — PREDNISONE 20 MG PO TABS
40.0000 mg | ORAL_TABLET | Freq: Every day | ORAL | Status: DC
  Administered 2013-09-25: 40 mg via ORAL
  Filled 2013-09-24 (×2): qty 2

## 2013-09-24 MED ORDER — IPRATROPIUM-ALBUTEROL 0.5-2.5 (3) MG/3ML IN SOLN
3.0000 mL | Freq: Four times a day (QID) | RESPIRATORY_TRACT | Status: DC
  Administered 2013-09-25 (×2): 3 mL via RESPIRATORY_TRACT
  Filled 2013-09-24 (×2): qty 3

## 2013-09-24 MED ORDER — DOXYCYCLINE HYCLATE 100 MG PO TABS
100.0000 mg | ORAL_TABLET | Freq: Two times a day (BID) | ORAL | Status: DC
  Administered 2013-09-24 – 2013-09-25 (×3): 100 mg via ORAL
  Filled 2013-09-24 (×4): qty 1

## 2013-09-24 NOTE — Evaluation (Signed)
Clinical/Bedside Swallow Evaluation Patient Details  Name: Jennifer Nielsen MRN: [id number] Date of Birth: 1924/10/05  Today's Date: 09/24/2013 Time: 8185-6314 SLP Time Calculation (min): 38 min  Past Medical History:  Past Medical History  Diagnosis Date  . Arrhythmia   . Arthritis   . Atrial fibrillation     Remains stable (As of 05/09/13)  . H/O blood clots   . Hyperlipemia   . Dementia     Remains stable & continues to function adequately in the current living environment with supervision. Has had little changes in behavior (AS OF 05/09/13)  . GERD (gastroesophageal reflux disease)     Stable (As of 05/09/13)  . Osteoarthritis     Denies pain (As of 05/09/13)   Past Surgical History:  Past Surgical History  Procedure Laterality Date  . Appendectomy    . Gallbladder surgery    . Hip surgery      Right-Metal plate   . Leg surgery      Left leg   HPI:  80 yo admit to [facility] with respiratory difficulties, COPD exacerbation, dementia.  Per MD note, pt coughing while eating and swallow evaluation was ordered.  Pt resides at [facility] and reports recent diet modification to "ground" due to their concerns for dysphagia.  Pt diet was to be advanced to regular consistency 4/3 per review of SNF chart but pt states it was not changed.  Pt CXR 4/10 negative, emphysema.     Assessment / Plan / Recommendation Clinical Impression  Pt without clinical indication of aspiration/airway compromise nor cranial nerve deficits.  Pt with baseline cough that did not worsen with po intake.  Xerostomia reported for which SLP provided biotene oral rinse.  Pt naturally eats slowly and masticates well.  Recommend regular/thin continue with general aspiration precautions.  SLP educated pt and daughter to results, recommendations with teach back for comprehension.  SLP to sign off.  Thanks for referral.     Aspiration Risk  Mild    Diet Recommendation Regular;Thin liquid   Liquid Administration via:  Cup;Straw Medication Administration: Whole meds with liquid Supervision: Patient able to self feed Compensations: Slow rate;Small sips/bites Postural Changes and/or Swallow Maneuvers: Upright 30-60 min after meal;Seated upright 90 degrees    Other  Recommendations Oral Care Recommendations: Oral care BID   Follow Up Recommendations  None    Frequency and Duration   n/a     Pertinent Vitals/Pain Afebrile, decreased      Swallow Study Prior Functional Status   at [place], see [place] Date of Onset: 09/24/13 HPI: 80 yo admit to [facility] with respiratory difficulties, COPD exacerbation, dementia.  Per MD note, pt coughing while eating and swallow evaluation was ordered.  Pt resides at [facility] and reports recent diet modification to "ground" due to their concerns for dysphagia.  Pt diet was to be advanced to regular consistency 4/3 per review of SNF chart but pt states it was not changed.  Pt CXR 4/10 negative, emphysema.   Type of Study: Bedside swallow evaluation Diet Prior to this Study: Regular;Thin liquids Temperature Spikes Noted: No Respiratory Status: Nasal cannula History of Recent Intubation: No Behavior/Cognition: Alert;Cooperative;Pleasant mood Oral Cavity - Dentition: Dentures, top;Dentures, bottom Self-Feeding Abilities: Able to feed self Patient Positioning: Upright in bed Baseline Vocal Quality: Hoarse Volitional Cough: Strong Volitional Swallow: Able to elicit    Oral/Motor/Sensory Function Overall Oral Motor/Sensory Function: Appears within functional limits for tasks assessed   Ice Chips Ice chips: Not  tested   Thin Liquid Thin Liquid: Within functional limits Presentation: Self Fed;Straw    Nectar Thick Nectar Thick Liquid: Not tested   Honey Thick Honey Thick Liquid: Not tested   Puree Puree: Within functional limits Presentation: Self Fed;Spoon   Solid   GO    Solid: Within functional limits Presentation: Self Fed;Spoon Other Comments:  minimal oral residuals of cracker, cleared with water intake       [place], [facility] SLP [phone]

## 2013-09-24 NOTE — Progress Notes (Signed)
[facility] PROGRESS NOTE  Jennifer Nielsen WGN:[id number] DOB: 08-Feb-1925 DOA: 09/19/2013 PCP: [name], MD  Assessment/Plan: Acute respiratory failure  -Improving -suspect multifactorial, COPD exacerbation, ? Developing/Early pneumonia, likely has undiagnosed OSA/OHS too  -severe congestion and some wheezing on admission -severe Resp acidosis on admission, improved on BIPAP -off BIPAP, QHS PRN  -cut down IV solumedrol, changed to Prednisone 4/12, nebs  -Empiric Abx-stopped Vanc, s/p 4 days of  Aztreonam, change to PO Doxycycline today, allergic to Quinolones and PCN -repeat CXR unchanged -D/w PCCM Dr.Byrum on admission  -concern for aspiration, lot of coughing while eating, swallow eval pending -wean O2  COPD with exacerbation  -see above   Atrial fibrillation  -rate controlled -continue diltiazem/xarelto  -Dig level slightly elevated on admission, improved with holding dose, resume dig every other day  Metabolic encephalopathy  -due to acute illness, CO2 narcosis -improved  Dementia  -daughters surprised to hear this but clearly documented in previous hospital and office notes  -continue depakote sprinkles, hold olanzepine   Chronic diastolic CHF  -very difficult to determine volume status given body habitus, no overt heart failure, but BNP slighty up compared to recent level  -stable, continue  lasix PO  DVt proph: on xarelto chronically   Code Status: DNR  Family Communication: discussed with both daughters at bedside 4/10, none at bedside  Disposition Plan: SNF tomorrow if stable  Consultants:  D/w PCCM  HPI/Subjective: Feels better, breathing better, coughing a lot  Objective: Filed Vitals:   09/24/13 0641  BP: 126/61  Pulse: 77  Temp: 97.8 F (36.6 C)  Resp: 24    Intake/Output Summary (Last 24 hours) at 09/24/13 0927 Last data filed at 09/23/13 2307  Gross per 24 hour  Intake    560 ml  Output      0 ml  Net    560 ml   Filed Weights   09/22/13 0700 09/23/13 0427 09/24/13 0641  Weight: 91.4 kg (201 lb 8 oz) 91.2 kg (201 lb 1 oz) 91.717 kg (202 lb 3.2 oz)    Exam:   General:  AAOx to self and place  Cardiovascular: S1S2/RRR  Respiratory: improved air movement, scattered ronchi  Abdomen: soft, Obese, Nt, BS present  Musculoskeletal:no edema c/c  Data Reviewed: Basic Metabolic Panel:  Recent Labs Lab 09/20/13 0318 09/21/13 0354 09/22/13 0435 09/23/13 0506 09/24/13 0342  NA 137 137 137 137 137  K 4.6 4.4 4.5 4.8 4.1  CL 88* 87* 87* 89* 90*  CO2 41* 42* 38* 42* 41*  GLUCOSE 189* 167* 138* 166* 131*  BUN 16 25* 28* 26* 27*  CREATININE 0.53 0.50 0.47* 0.49* 0.51  CALCIUM 9.0 9.0 9.0 8.7 8.5   Liver Function Tests:  Recent Labs Lab 09/19/13 1258 09/20/13 0318  AST 40* 31  ALT 26 26  ALKPHOS 85 80  BILITOT 0.5 0.4  PROT 7.3 7.2  ALBUMIN 3.1* 3.0*   No results found for this basename: LIPASE, AMYLASE,  in the last 168 hours No results found for this basename: AMMONIA,  in the last 168 hours CBC:  Recent Labs Lab 09/19/13 1258 09/20/13 0318 09/21/13 0354 09/22/13 0435 09/24/13 0342  WBC 6.2 3.9* 8.8 8.6 8.4  NEUTROABS 3.7  --   --   --   --   HGB 13.2 13.3 12.8 12.7 12.2  HCT 41.8 42.2 40.3 40.2 39.2  MCV 99.8 99.5 97.1 98.0 98.5  PLT 224 199 208 222 204   Cardiac Enzymes: No results found for this  basename: CKTOTAL, CKMB, CKMBINDEX, TROPONINI,  in the last 168 hours BNP (last 3 results)  Recent Labs  06/25/13 1337 09/19/13 1258  PROBNP 3381.0* 4084.0*   CBG:  Recent Labs Lab 09/20/13 0822  GLUCAP 138*    Recent Results (from the past 240 hour(s))  CULTURE, BLOOD (ROUTINE X 2)     Status: None   Collection Time    09/19/13  1:03 PM      Result Value Ref Range Status   Specimen Description BLOOD RIGHT HAND   Final   Special Requests BOTTLES DRAWN AEROBIC AND ANAEROBIC 5ML   Final   Culture  Setup Time     Final   Value: 09/19/2013 16:13     Performed at [facility]   Culture     Final   Value:        BLOOD CULTURE RECEIVED NO GROWTH TO DATE CULTURE WILL BE HELD FOR 5 DAYS BEFORE ISSUING A FINAL NEGATIVE REPORT     Performed at [facility]   Report Status PENDING   Incomplete  CULTURE, BLOOD (ROUTINE X 2)     Status: None   Collection Time    09/19/13  1:54 PM      Result Value Ref Range Status   Specimen Description BLOOD RIGHT WRIST   Final   Special Requests BOTTLES DRAWN AEROBIC AND ANAEROBIC 5 CC EA   Final   Culture  Setup Time     Final   Value: 09/19/2013 21:20     Performed at [facility]   Culture     Final   Value:        BLOOD CULTURE RECEIVED NO GROWTH TO DATE CULTURE WILL BE HELD FOR 5 DAYS BEFORE ISSUING A FINAL NEGATIVE REPORT     Performed at [facility]   Report Status PENDING   Incomplete  URINE CULTURE     Status: None   Collection Time    09/19/13  2:01 PM      Result Value Ref Range Status   Specimen Description URINE, CATHETERIZED   Final   Special Requests Normal   Final   Culture  Setup Time     Final   Value: 09/19/2013 21:35     Performed at [facility]     Final   Value: 50,000 COLONIES/ML     Performed at [facility]   Culture     Final   Value: Multiple bacterial morphotypes present, none predominant. Suggest appropriate recollection if clinically indicated.     Performed at [facility]   Report Status 09/20/2013 FINAL   Final  MRSA PCR SCREENING     Status: None   Collection Time    09/19/13  5:29 PM      Result Value Ref Range Status   MRSA by PCR NEGATIVE  NEGATIVE Final   Comment:            The GeneXpert MRSA Assay (FDA     approved for NASAL specimens     only), is one component of a     comprehensive MRSA colonization     surveillance program. It is not     intended to diagnose MRSA     infection nor to guide or     monitor treatment for     MRSA infections.     Studies: No results found.  Scheduled Meds: .  aztreonam  1 g  Intravenous 3 times per day  . digoxin  0.125 mg Oral Q T,Th,S,Su  . diltiazem  120 mg Oral Daily  . divalproex  125 mg Oral BID  . docusate sodium  100 mg Oral Daily  . furosemide  20 mg Oral BID  . ipratropium-albuterol  3 mL Nebulization Q6H  . predniSONE  50 mg Oral Q breakfast  . rivaroxaban  20 mg Oral Q supper   Continuous Infusions:  Antibiotics Given (last 72 hours)   Date/Time Action Medication Dose Rate   09/21/13 1442 Given   aztreonam (AZACTAM) 1 g in dextrose 5 % 50 mL IVPB 1 g 100 mL/hr   09/21/13 2242 Given   aztreonam (AZACTAM) 1 g in dextrose 5 % 50 mL IVPB 1 g 100 mL/hr   09/22/13 0626 Given   aztreonam (AZACTAM) 1 g in dextrose 5 % 50 mL IVPB 1 g 100 mL/hr   09/22/13 1319 Given   aztreonam (AZACTAM) 1 g in dextrose 5 % 50 mL IVPB 1 g 100 mL/hr   09/22/13 2158 Given   aztreonam (AZACTAM) 1 g in dextrose 5 % 50 mL IVPB 1 g 100 mL/hr   09/23/13 0525 Given   aztreonam (AZACTAM) 1 g in dextrose 5 % 50 mL IVPB 1 g 100 mL/hr   09/23/13 1327 Given   aztreonam (AZACTAM) 1 g in dextrose 5 % 50 mL IVPB 1 g 100 mL/hr   09/23/13 2239 Given   aztreonam (AZACTAM) 1 g in dextrose 5 % 50 mL IVPB 1 g 100 mL/hr   09/24/13 0521 Given   aztreonam (AZACTAM) 1 g in dextrose 5 % 50 mL IVPB 1 g 100 mL/hr      Active Problems:   Atrial fibrillation   Metabolic encephalopathy   Dementia   COPD with exacerbation   Acute respiratory failure    Time spent: 46min    Domenic Polite  [facility] Pager [phone]. If 7PM-7AM, please contact night-coverage at [email or web address], password [facility] 09/24/2013, 9:27 AM  LOS: 5 days

## 2013-09-24 NOTE — Progress Notes (Signed)
Clinical Social Work  Per chart review, patient not medically stable for DC today but possibly tomorrow. CSW updated SNF on DC plans and will continue to follow.  Sindy Messing, LCSW (Coverage for [employer])

## 2013-09-25 DIAGNOSIS — F02818 Dementia in other diseases classified elsewhere, unspecified severity, with other behavioral disturbance: Secondary | ICD-10-CM | POA: Diagnosis not present

## 2013-09-25 DIAGNOSIS — I10 Essential (primary) hypertension: Secondary | ICD-10-CM | POA: Diagnosis not present

## 2013-09-25 DIAGNOSIS — F0281 Dementia in other diseases classified elsewhere with behavioral disturbance: Secondary | ICD-10-CM | POA: Diagnosis not present

## 2013-09-25 DIAGNOSIS — F3289 Other specified depressive episodes: Secondary | ICD-10-CM | POA: Diagnosis not present

## 2013-09-25 DIAGNOSIS — F329 Major depressive disorder, single episode, unspecified: Secondary | ICD-10-CM | POA: Diagnosis not present

## 2013-09-25 DIAGNOSIS — F22 Delusional disorders: Secondary | ICD-10-CM | POA: Diagnosis not present

## 2013-09-25 DIAGNOSIS — J441 Chronic obstructive pulmonary disease with (acute) exacerbation: Secondary | ICD-10-CM | POA: Diagnosis not present

## 2013-09-25 DIAGNOSIS — F0390 Unspecified dementia without behavioral disturbance: Secondary | ICD-10-CM | POA: Diagnosis not present

## 2013-09-25 DIAGNOSIS — G9341 Metabolic encephalopathy: Secondary | ICD-10-CM | POA: Diagnosis not present

## 2013-09-25 DIAGNOSIS — I5032 Chronic diastolic (congestive) heart failure: Secondary | ICD-10-CM | POA: Diagnosis not present

## 2013-09-25 DIAGNOSIS — J9600 Acute respiratory failure, unspecified whether with hypoxia or hypercapnia: Secondary | ICD-10-CM | POA: Diagnosis not present

## 2013-09-25 DIAGNOSIS — K2100 Gastro-esophageal reflux disease with esophagitis, without bleeding: Secondary | ICD-10-CM | POA: Diagnosis not present

## 2013-09-25 DIAGNOSIS — I5033 Acute on chronic diastolic (congestive) heart failure: Secondary | ICD-10-CM | POA: Diagnosis present

## 2013-09-25 DIAGNOSIS — Z7901 Long term (current) use of anticoagulants: Secondary | ICD-10-CM | POA: Diagnosis not present

## 2013-09-25 DIAGNOSIS — M6250 Muscle wasting and atrophy, not elsewhere classified, unspecified site: Secondary | ICD-10-CM | POA: Diagnosis not present

## 2013-09-25 DIAGNOSIS — I509 Heart failure, unspecified: Secondary | ICD-10-CM | POA: Diagnosis not present

## 2013-09-25 DIAGNOSIS — M6281 Muscle weakness (generalized): Secondary | ICD-10-CM | POA: Diagnosis not present

## 2013-09-25 DIAGNOSIS — I4891 Unspecified atrial fibrillation: Secondary | ICD-10-CM | POA: Diagnosis not present

## 2013-09-25 DIAGNOSIS — I5020 Unspecified systolic (congestive) heart failure: Secondary | ICD-10-CM | POA: Diagnosis not present

## 2013-09-25 DIAGNOSIS — J189 Pneumonia, unspecified organism: Secondary | ICD-10-CM | POA: Diagnosis not present

## 2013-09-25 DIAGNOSIS — F29 Unspecified psychosis not due to a substance or known physiological condition: Secondary | ICD-10-CM | POA: Diagnosis not present

## 2013-09-25 DIAGNOSIS — M129 Arthropathy, unspecified: Secondary | ICD-10-CM | POA: Diagnosis not present

## 2013-09-25 LAB — CBC
HCT: 41 % (ref 36.0–46.0)
HEMOGLOBIN: 12.8 g/dL (ref 12.0–15.0)
MCH: 30.5 pg (ref 26.0–34.0)
MCHC: 31.2 g/dL (ref 30.0–36.0)
MCV: 97.6 fL (ref 78.0–100.0)
Platelets: 210 10*3/uL (ref 150–400)
RBC: 4.2 MIL/uL (ref 3.87–5.11)
RDW: 16.1 % — ABNORMAL HIGH (ref 11.5–15.5)
WBC: 9.2 10*3/uL (ref 4.0–10.5)

## 2013-09-25 LAB — BASIC METABOLIC PANEL
BUN: 25 mg/dL — ABNORMAL HIGH (ref 6–23)
CO2: 40 mEq/L (ref 19–32)
CREATININE: 0.49 mg/dL — AB (ref 0.50–1.10)
Calcium: 8.4 mg/dL (ref 8.4–10.5)
Chloride: 89 mEq/L — ABNORMAL LOW (ref 96–112)
GFR calc Af Amer: >90 mL/min (ref >90)
GFR calc non Af Amer: 85 mL/min — ABNORMAL LOW (ref >90)
Glucose, Bld: 89 mg/dL (ref 70–99)
Potassium: 4.3 mEq/L (ref 3.7–5.3)
Sodium: 137 mEq/L (ref 137–147)

## 2013-09-25 LAB — CULTURE, BLOOD (ROUTINE X 2)
CULTURE: NO GROWTH
Culture: NO GROWTH

## 2013-09-25 MED ORDER — DOXYCYCLINE HYCLATE 100 MG PO TABS: 100.0000 mg | ORAL_TABLET | Freq: Two times a day (BID) | ORAL | Status: DC

## 2013-09-25 MED ORDER — SACCHAROMYCES BOULARDII 250 MG PO CAPS: 250.0000 mg | ORAL_CAPSULE | Freq: Two times a day (BID) | ORAL | Status: DC

## 2013-09-25 MED ORDER — DIGOXIN 125 MCG PO TABS: 0.1250 mg | ORAL_TABLET | ORAL | Status: DC

## 2013-09-25 MED ORDER — PREDNISONE 20 MG PO TABS: 10.0000 mg | ORAL_TABLET | Freq: Every day | ORAL | Status: DC

## 2013-09-25 NOTE — Progress Notes (Signed)
Clinical Social Work  CSW faxed DC summary to [facility] who is agreeable to accept today. CSW informed patient, dtr, RN,and MD of DC plans and all parties agreeable. RN to call report and dtr to call SNF re: paperwork. CSW prepared DC packet with FL2 and DNR form included. CSW coordinated transportation via [facility] per patient and family request. [facility] #: [phone].  CSW is signing off but available if needed.  Sindy Messing, LCSW  (Coverage [facility])

## 2013-09-25 NOTE — Discharge Summary (Signed)
Physician Discharge Summary  Jennifer Nielsen OJJ:[id number] DOB: 1925/04/16 DOA: 09/19/2013  PCP: [name], MD  Admit date: 09/19/2013 Discharge date: 09/25/2013  Time spent: 45 minutes  Recommendations for Outpatient Follow-up:  1. PCP in 1 week 2. Daily weights, Bmet in 1 week 3. Consider outpatient sleep study, suspect underlying OSA   Discharge Diagnoses:    Acute respiratory failure   Atrial fibrillation   Metabolic encephalopathy   Dementia   COPD with exacerbation   Health care associated pneumonia   Acute on chronic diastolic CHF   Mild Dementia   Discharge Condition: improved  Diet recommendation: low sodium  Filed Weights   09/23/13 0427 09/24/13 0641 09/25/13 0652  Weight: 91.2 kg (201 lb 1 oz) 91.717 kg (202 lb 3.2 oz) 91.3 kg (201 lb 4.5 oz)    History of present illness:  HPI: Jennifer Nielsen is a 80 y.o. female with PMH of COPD, chronic diastolic CHF, Afib now on Xarelto, Mild Dementia, resident of [facility] was sent to the ER with the above complaint.  History is provided by daughters at bedside, they report cough and congestion since this weekend.  In addition she also started having fevers since yesterday, today was more lethargic and hence transferred to the ER for further management  She had an CXR today at SNF and was suspected to have pnuemonia and ordered to start Abx for this.  In ER, tachypneic, lethargic, TR[facility] consulted, CXR unremarkable  Hospital Course:  Acute respiratory failure  -Improving  -suspect multifactorial, COPD exacerbation, ? Developing/Early pneumonia, likely has undiagnosed OSA/OHS too  -severe congestion and some wheezing on admission  -severe Resp acidosis on admission, improved on BIPAP  -consider outpatient sleep study -was on IV solumedrol, changed to Prednisone 4/12, nebs, taper Prednisone over 1 week -Empiric Abx-stopped Vanc, s/p 4 days of Aztreonam, changed to PO Doxycycline 4/13, allergic to Quinolones and PCN, continue for 4  more days -repeat CXR unchanged  -Blood cultures negative -D/w PCCM Dr.Byrum on admission  -concern for aspiration, lot of coughing while eating, swallow eval completed per Speech therapy assessment : Pt without clinical indication of aspiration/airway compromise  -continue regular diet with thin liquids  COPD with exacerbation  -see above   Atrial fibrillation  -rate controlled  -continue diltiazem/xarelto  -Dig level elevated on admission, improved with holding dose, resumed dig every other day dosing  Metabolic encephalopathy  -due to acute illness, CO2 narcosis  -improved   Dementia  -daughters surprised to hear this but clearly documented in previous hospital and office notes  -continue depakote sprinkles, held olanzepine due to mentation on admission and didn't require this during hospitalization  Acute on Chronic diastolic CHF  -very difficult to determine volume status given body habitus, no overt heart failure, but BNP slighty up compared to recent level  -diuresed with IV lasix initially -now stable, continue lasix PO   Code Status: DNR    Consultations:  Speech therapy  Discharge Exam: Filed Vitals:   09/25/13 0652  BP: 135/81  Pulse: 77  Temp: 98.1 F (36.7 C)  Resp: 20    General: AAOx3, no distress Cardiovascular: S1S2/RRR Respiratory: improved air movement, diminished at bases  Discharge Instructions You were cared for by a hospitalist during your hospital stay. If you have any questions about your discharge medications or the care you received while you were in the hospital after you are discharged, you can call the unit and asked to speak with the hospitalist on call if the hospitalist that  took care of you is not available. Once you are discharged, your primary care physician will handle any further medical issues. Please note that NO REFILLS for any discharge medications will be authorized once you are discharged, as it is imperative that you  return to your primary care physician (or establish a relationship with a primary care physician if you do not have one) for your aftercare needs so that they can reassess your need for medications and monitor your lab values.  Discharge Orders   Future Appointments Provider Department Dept Phone   11/29/2013 12:00 PM Dorothy Spark, MD [facility] [phone]   Future Orders Complete By Expires   Diet - low sodium heart healthy  As directed    Increase activity slowly  As directed        Medication List    STOP taking these medications       doxycycline 100 MG capsule  Commonly known as:  VIBRAMYCIN  Replaced by:  doxycycline 100 MG tablet     OLANZapine 2.5 MG tablet  Commonly known as:  ZYPREXA      TAKE these medications       albuterol 1.25 MG/3ML nebulizer solution  Commonly known as:  ACCUNEB  Take 2 ampules by nebulization every 6 (six) hours as needed for wheezing. Albuterol 2.5 mg/58ml via neb every 6 hours as needed-to start on 09/22/2013 at 6pm     CALTRATE 600+D PO  Take 1 tablet by mouth every morning. For calcium supplement     digoxin 0.125 MG tablet  Commonly known as:  LANOXIN  Take 1 tablet (0.125 mg total) by mouth every other day.     diltiazem 120 MG 24 hr capsule  Commonly known as:  CARDIZEM CD  Take 120 mg by mouth daily. For A-Fib     divalproex 125 MG capsule  Commonly known as:  DEPAKOTE SPRINKLE  Take 125 mg by mouth 2 (two) times daily. For mood stabilzer     doxycycline 100 MG tablet  Commonly known as:  VIBRA-TABS  Take 1 tablet (100 mg total) by mouth every 12 (twelve) hours. For 4 days     DSS 100 MG Caps  Take 100 mg by mouth daily. For constipation     furosemide 40 MG tablet  Commonly known as:  LASIX  Take 40 mg by mouth daily. For HTN     MUCINEX PO  Take 600 mg by mouth 2 (two) times daily. Take 1 tablet bid x 14 days     omeprazole 20 MG capsule  Commonly known as:  PRILOSEC  Take 20 mg by mouth  daily before breakfast. For GERD     potassium chloride 10 MEQ tablet  Commonly known as:  K-DUR  Take 10 mEq by mouth daily. For supplement     predniSONE 20 MG tablet  Commonly known as:  DELTASONE  Take 0.5-2 tablets (10-40 mg total) by mouth daily with breakfast. 40mg  for 1 day then 30mg  for 2days then 20mg  for 2days then 10mg  for 2days then STOP     saccharomyces boulardii 250 MG capsule  Commonly known as:  FLORASTOR  Take 1 capsule (250 mg total) by mouth 2 (two) times daily. For 10days     SYSTANE OP  Place 1 drop into both eyes 2 (two) times daily. For dry eyes     TYLENOL 500 MG tablet  Generic drug:  acetaminophen  Take 500 mg by mouth 3 (three)  times daily.     XARELTO 20 MG Tabs tablet  Generic drug:  rivaroxaban  Take 20 mg by mouth daily. For DVT       Allergies  Allergen Reactions  . Chocolate     unknown  . Ciprofloxacin     unknown  . Coffee Bean Extract [Coffea Arabica]     unknown  . Gentamycin [Gentamicin]     unknown  . Imodium [Loperamide]     unknown  . Penicillins     unknown  . Sulfa Antibiotics     unknown  . Tea     unknown       Follow-up Information   Follow up with DAY,JAMES, MD. Schedule an appointment as soon as possible for a visit in 1 week.   Specialty:  Internal Medicine       The results of significant diagnostics from this hospitalization (including imaging, microbiology, ancillary and laboratory) are listed below for reference.    Significant Diagnostic Studies: Dg Chest 2 View  09/20/2013   CLINICAL DATA:  Cough and congestion.  Hypoxia  EXAM: CHEST  2 VIEW  COMPARISON:  09/19/2013  FINDINGS: Cardiac enlargement without heart failure. Lungs remain clear without infiltrate or effusion.  IMPRESSION: No active cardiopulmonary disease.   Electronically Signed   By: Franchot Gallo M.D.   On: 09/20/2013 09:23   Dg Chest Portable 1 View  09/19/2013   CLINICAL DATA:  Shortness of Breath  EXAM: PORTABLE CHEST - 1 VIEW   COMPARISON:  August 23, 2013  FINDINGS: There is underlying emphysematous change. There is no edema consolidation. The heart is enlarged with normal pulmonary vascularity. No adenopathy. There is osteoarthritic change in both shoulders.  IMPRESSION: Cardiomegaly.  No edema or consolidation.  Underlying emphysema.   Electronically Signed   By: Lowella Grip M.D.   On: 09/19/2013 13:40    Microbiology: Recent Results (from the past 240 hour(s))  CULTURE, BLOOD (ROUTINE X 2)     Status: None   Collection Time    09/19/13  1:03 PM      Result Value Ref Range Status   Specimen Description BLOOD RIGHT HAND   Final   Special Requests BOTTLES DRAWN AEROBIC AND ANAEROBIC 5ML   Final   Culture  Setup Time     Final   Value: 09/19/2013 16:13     Performed at [facility]   Culture     Final   Value: NO GROWTH 5 DAYS     Performed at [facility]   Report Status 09/25/2013 FINAL   Final  CULTURE, BLOOD (ROUTINE X 2)     Status: None   Collection Time    09/19/13  1:54 PM      Result Value Ref Range Status   Specimen Description BLOOD RIGHT WRIST   Final   Special Requests BOTTLES DRAWN AEROBIC AND ANAEROBIC 5 CC EA   Final   Culture  Setup Time     Final   Value: 09/19/2013 21:20     Performed at [facility]   Culture     Final   Value: NO GROWTH 5 DAYS     Performed at [facility]   Report Status 09/25/2013 FINAL   Final  URINE CULTURE     Status: None   Collection Time    09/19/13  2:01 PM      Result Value Ref Range Status   Specimen Description URINE, CATHETERIZED   Final  Special Requests Normal   Final   Culture  Setup Time     Final   Value: 09/19/2013 21:35     Performed at [facility]     Final   Value: 50,000 COLONIES/ML     Performed at [facility]   Culture     Final   Value: Multiple bacterial morphotypes present, none predominant. Suggest appropriate recollection if clinically indicated.      Performed at [facility]   Report Status 09/20/2013 FINAL   Final  MRSA PCR SCREENING     Status: None   Collection Time    09/19/13  5:29 PM      Result Value Ref Range Status   MRSA by PCR NEGATIVE  NEGATIVE Final   Comment:            The GeneXpert MRSA Assay (FDA     approved for NASAL specimens     only), is one component of a     comprehensive MRSA colonization     surveillance program. It is not     intended to diagnose MRSA     infection nor to guide or     monitor treatment for     MRSA infections.     Labs: Basic Metabolic Panel:  Recent Labs Lab 09/21/13 0354 09/22/13 0435 09/23/13 0506 09/24/13 0342 09/25/13 0410  NA 137 137 137 137 137  K 4.4 4.5 4.8 4.1 4.3  CL 87* 87* 89* 90* 89*  CO2 42* 38* 42* 41* 40*  GLUCOSE 167* 138* 166* 131* 89  BUN 25* 28* 26* 27* 25*  CREATININE 0.50 0.47* 0.49* 0.51 0.49*  CALCIUM 9.0 9.0 8.7 8.5 8.4   Liver Function Tests:  Recent Labs Lab 09/19/13 1258 09/20/13 0318  AST 40* 31  ALT 26 26  ALKPHOS 85 80  BILITOT 0.5 0.4  PROT 7.3 7.2  ALBUMIN 3.1* 3.0*   No results found for this basename: LIPASE, AMYLASE,  in the last 168 hours No results found for this basename: AMMONIA,  in the last 168 hours CBC:  Recent Labs Lab 09/19/13 1258 09/20/13 0318 09/21/13 0354 09/22/13 0435 09/24/13 0342 09/25/13 0410  WBC 6.2 3.9* 8.8 8.6 8.4 9.2  NEUTROABS 3.7  --   --   --   --   --   HGB 13.2 13.3 12.8 12.7 12.2 12.8  HCT 41.8 42.2 40.3 40.2 39.2 41.0  MCV 99.8 99.5 97.1 98.0 98.5 97.6  PLT 224 199 208 222 204 210   Cardiac Enzymes: No results found for this basename: CKTOTAL, CKMB, CKMBINDEX, TROPONINI,  in the last 168 hours BNP: BNP (last 3 results)  Recent Labs  06/25/13 1337 09/19/13 1258  PROBNP 3381.0* 4084.0*   CBG:  Recent Labs Lab 09/20/13 0822  GLUCAP 138*       Signed:  Domenic Polite  [facility] 09/25/2013, 10:23 AM

## 2013-09-25 NOTE — Progress Notes (Signed)
Pt d/c to [facility]. Full report called to RN at [facility] at 11:50am.   Jordan Hawks, RN

## 2013-09-26 ENCOUNTER — Encounter: Payer: Self-pay | Admitting: *Deleted

## 2013-09-26 ENCOUNTER — Encounter: Payer: Self-pay | Admitting: Adult Health

## 2013-09-26 ENCOUNTER — Non-Acute Institutional Stay (SKILLED_NURSING_FACILITY): Payer: Medicare Other | Admitting: Adult Health

## 2013-09-26 DIAGNOSIS — I4891 Unspecified atrial fibrillation: Secondary | ICD-10-CM

## 2013-09-26 DIAGNOSIS — F0390 Unspecified dementia without behavioral disturbance: Secondary | ICD-10-CM

## 2013-09-26 DIAGNOSIS — K5900 Constipation, unspecified: Secondary | ICD-10-CM

## 2013-09-26 DIAGNOSIS — J441 Chronic obstructive pulmonary disease with (acute) exacerbation: Secondary | ICD-10-CM

## 2013-09-26 DIAGNOSIS — I5032 Chronic diastolic (congestive) heart failure: Secondary | ICD-10-CM

## 2013-09-26 DIAGNOSIS — J189 Pneumonia, unspecified organism: Secondary | ICD-10-CM

## 2013-09-26 DIAGNOSIS — I509 Heart failure, unspecified: Secondary | ICD-10-CM

## 2013-09-28 DIAGNOSIS — I5032 Chronic diastolic (congestive) heart failure: Secondary | ICD-10-CM | POA: Insufficient documentation

## 2013-09-28 NOTE — Progress Notes (Signed)
Patient ID: Jennifer Nielsen, female   DOB: [birth year], 80 y.o.   MRN: [id number]         PROGRESS NOTE  DATE: 09/26/13  FACILITY: [facility]  LEVEL OF CARE: SNF  Acute Visit  CHIEF COMPLAINT:  Follow-up Hospitalization  HISTORY OF PRESENT ILLNESS: This is an 80 year old female who has been re-admitted to [facility] on 09/25/13 from [facility] with COPD exacerbation and pneumonia. She is here for long-term care.   PAST MEDICAL HISTORY : Reviewed.  No changes.  CURRENT MEDICATIONS: Reviewed per [facility]  REVIEW OF SYSTEMS:  GENERAL: no change in appetite, no fatigue, chills or weakness RESPIRATORY: no SOB, DOE, hemoptysis, + coughing CARDIAC: no chest pain, or palpitations, GI: no abdominal pain, diarrhea, constipation, heart burn, nausea or vomiting  PHYSICAL EXAMINATION  GENERAL: no acute distress, moderately obese body habitus NECK: supple, trachea midline, no neck masses, no thyroid tenderness, no thyromegaly RESPIRATORY: breathing is even & unlabored, + wheezing on bilateral upper lung fields CARDIAC: irregularly irregular, no murmur,no extra heart sounds GI: abdomen soft, normal BS, no masses, no tenderness, no hepatomegaly, no splenomegaly EXTREMITIES: able to move all 4 extremities and uses wheelchair PSYCHIATRIC: the patient is alert & oriented to person, affect & behavior appropriate  LABS/RADIOLOGY: Labs reviewed: Basic Metabolic Panel:  Recent Labs  09/23/13 0506 09/24/13 0342 09/25/13 0410  NA 137 137 137  K 4.8 4.1 4.3  CL 89* 90* 89*  CO2 42* 41* 40*  GLUCOSE 166* 131* 89  BUN 26* 27* 25*  CREATININE 0.49* 0.51 0.49*  CALCIUM 8.7 8.5 8.4   Liver Function Tests:  Recent Labs  07/12/13 1158 09/19/13 1258 09/20/13 0318  AST 22 40* 31  ALT 20 26 26   ALKPHOS 80 85 80  BILITOT 0.7 0.5 0.4  PROT 7.0 7.3 7.2  ALBUMIN 3.2* 3.1* 3.0*   CBC:  Recent Labs  06/25/13 1135  09/19/13 1258  09/22/13 0435 09/24/13 0342 09/25/13 0410  WBC  8.0  < > 6.2  < > 8.6 8.4 9.2  NEUTROABS 5.9  --  3.7  --   --   --   --   HGB 13.7  < > 13.2  < > 12.7 12.2 12.8  HCT 41.3  < > 41.8  < > 40.2 39.2 41.0  MCV 90.2  < > 99.8  < > 98.0 98.5 97.6  PLT 357  < > 224  < > 222 204 210  < > = values in this interval not displayed.  Cardiac Enzymes:  Recent Labs  06/25/13 2115 06/26/13 0235 06/26/13 0840  TROPONINI <0.30 <0.30 <0.30   CBG:  Recent Labs  09/20/13 0822  GLUCAP 138*   07/09/13 sodium 140 potassium 4.1 glucose 80 BUN 14 creatinine 0.6 calcium 8.7 07/04/13 sodium 142 potassium 3.4 glucose 105 BUN 10 creatinine 0.4 calcium 8.7 06/23/13 sodium 118 potassium 5.5 glucose 98 BUN 16 creatinine 0.4 calcium 8.5 8-14 CO2 33, glucose 137, albumin 3.4 otherwise CMP normal, CBC normal 6/14 digoxin level 0.9, hemoglobin A1c 5.6, Depakote level 40.4 2/14 CBC normal, glucose 100, total protein 5.8 otherwise CMP normal, digoxin level 0.5 11/13 Depakote level 65.7  ASSESSMENT/PLAN:  Pneumonia - continue Doxycycline COPD exacerbation - continue prednisone tapering dose Chronic diastolic congestive heart failure - continue Lasix and Digoxin Atrial Fibrillation - continue Xarelto and Diltiazem Dementia - stable Constipation - continue Colace   CPT CODE: 41937  Elvis Laufer Vargas - NP [facility] [phone]

## 2013-10-08 DIAGNOSIS — F22 Delusional disorders: Secondary | ICD-10-CM | POA: Diagnosis not present

## 2013-10-08 DIAGNOSIS — F02818 Dementia in other diseases classified elsewhere, unspecified severity, with other behavioral disturbance: Secondary | ICD-10-CM | POA: Diagnosis not present

## 2013-10-08 DIAGNOSIS — F0281 Dementia in other diseases classified elsewhere with behavioral disturbance: Secondary | ICD-10-CM | POA: Diagnosis not present

## 2013-10-29 DIAGNOSIS — F3289 Other specified depressive episodes: Secondary | ICD-10-CM | POA: Diagnosis not present

## 2013-10-29 DIAGNOSIS — F329 Major depressive disorder, single episode, unspecified: Secondary | ICD-10-CM | POA: Diagnosis not present

## 2013-10-29 DIAGNOSIS — F29 Unspecified psychosis not due to a substance or known physiological condition: Secondary | ICD-10-CM | POA: Diagnosis not present

## 2013-11-05 DIAGNOSIS — F02818 Dementia in other diseases classified elsewhere, unspecified severity, with other behavioral disturbance: Secondary | ICD-10-CM | POA: Diagnosis not present

## 2013-11-05 DIAGNOSIS — F22 Delusional disorders: Secondary | ICD-10-CM | POA: Diagnosis not present

## 2013-11-05 DIAGNOSIS — F0281 Dementia in other diseases classified elsewhere with behavioral disturbance: Secondary | ICD-10-CM | POA: Diagnosis not present

## 2013-11-08 ENCOUNTER — Encounter: Payer: Self-pay | Admitting: Adult Health

## 2013-11-08 ENCOUNTER — Non-Acute Institutional Stay (SKILLED_NURSING_FACILITY): Payer: Medicare Other | Admitting: Adult Health

## 2013-11-08 DIAGNOSIS — I509 Heart failure, unspecified: Secondary | ICD-10-CM

## 2013-11-08 DIAGNOSIS — K5900 Constipation, unspecified: Secondary | ICD-10-CM | POA: Diagnosis not present

## 2013-11-08 DIAGNOSIS — I4891 Unspecified atrial fibrillation: Secondary | ICD-10-CM

## 2013-11-08 DIAGNOSIS — J449 Chronic obstructive pulmonary disease, unspecified: Secondary | ICD-10-CM

## 2013-11-08 DIAGNOSIS — I5032 Chronic diastolic (congestive) heart failure: Secondary | ICD-10-CM | POA: Diagnosis not present

## 2013-11-08 DIAGNOSIS — F0390 Unspecified dementia without behavioral disturbance: Secondary | ICD-10-CM

## 2013-11-08 NOTE — Progress Notes (Signed)
Patient ID: Jennifer Nielsen, female   DOB: 07-15-1924, 80 y.o.   MRN: [id number]        PROGRESS NOTE  DATE: 11/08/13  FACILITY: [facility]  LEVEL OF CARE: SNF  Routine Visit  CHIEF COMPLAINT:  Management COPD, CHF, Atrial Fibrillation and Dementia  HISTORY OF PRESENT ILLNESS:   Reassessment of on-going problems:  COPD: the COPD remains stable.  Pt denies sob, cough, wheezing or declining exercise tolerance.  No complications from the medications presently being used.  ATRIAL FIBRILLATION: the patients atrial fibrillation remains stable.  The patient denies DOE, tachycardia, orthopnea, transient neurological sx, pedal edema, palpitations, & PNDs.  No complications noted from the medications currently being used.   DEMENTIA: The dementia remaines stable and continues to function adequately in the current living environment with supervision.  The patient has had little changes in behavior. No complications noted from the medications presently being used.  PAST MEDICAL HISTORY : Reviewed.  No changes.  CURRENT MEDICATIONS: Reviewed per [facility]  REVIEW OF SYSTEMS:  GENERAL: no change in appetite, no fatigue, chills or weakness RESPIRATORY: no SOB, DOE, hemoptysis, CARDIAC: no chest pain, or palpitations GI: no abdominal pain, diarrhea, constipation, heart burn, nausea or vomiting  PHYSICAL EXAMINATION  GENERAL: no acute distress, moderately obese body habitus EYES: Lids open and close normally. No blepharitis, PERRL NECK: supple, trachea midline, no neck masses, no thyroid tenderness, no thyromegaly RESPIRATORY: breathing is even & unlabored CARDIAC: irregularly irregular, no murmur,no extra heart sounds GI: abdomen soft, normal BS, no masses, no tenderness, no hepatomegaly, no splenomegaly EXTREMITIES: able to move all 4 extremities and uses wheelchair PSYCHIATRIC: the patient is alert & oriented to person, affect & behavior appropriate  LABS/RADIOLOGY: Labs reviewed: Basic  Metabolic Panel:  Recent Labs  09/23/13 0506 09/24/13 0342 09/25/13 0410  NA 137 137 137  K 4.8 4.1 4.3  CL 89* 90* 89*  CO2 42* 41* 40*  GLUCOSE 166* 131* 89  BUN 26* 27* 25*  CREATININE 0.49* 0.51 0.49*  CALCIUM 8.7 8.5 8.4   Liver Function Tests:  Recent Labs  07/12/13 1158 09/19/13 1258 09/20/13 0318  AST 22 40* 31  ALT 20 26 26   ALKPHOS 80 85 80  BILITOT 0.7 0.5 0.4  PROT 7.0 7.3 7.2  ALBUMIN 3.2* 3.1* 3.0*   CBC:  Recent Labs  06/25/13 1135  09/19/13 1258  09/22/13 0435 09/24/13 0342 09/25/13 0410  WBC 8.0  < > 6.2  < > 8.6 8.4 9.2  NEUTROABS 5.9  --  3.7  --   --   --   --   HGB 13.7  < > 13.2  < > 12.7 12.2 12.8  HCT 41.3  < > 41.8  < > 40.2 39.2 41.0  MCV 90.2  < > 99.8  < > 98.0 98.5 97.6  PLT 357  < > 224  < > 222 204 210  < > = values in this interval not displayed.  Cardiac Enzymes:  Recent Labs  06/25/13 2115 06/26/13 0235 06/26/13 0840  TROPONINI <0.30 <0.30 <0.30   CBG:  Recent Labs  09/20/13 0822  GLUCAP 138*   07/09/13 sodium 140 potassium 4.1 glucose 80 BUN 14 creatinine 0.6 calcium 8.7 07/04/13 sodium 142 potassium 3.4 glucose 105 BUN 10 creatinine 0.4 calcium 8.7 06/23/13 sodium 118 potassium 5.5 glucose 98 BUN 16 creatinine 0.4 calcium 8.5 8-14 CO2 33, glucose 137, albumin 3.4 otherwise CMP normal, CBC normal 6/14 digoxin level 0.9, hemoglobin A1c 5.6, Depakote level  40.4 2/14 CBC normal, glucose 100, total protein 5.8 otherwise CMP normal, digoxin level 0.5 11/13 Depakote level 65.7  ASSESSMENT/PLAN:  COPD exacerbation - continue prednisone tapering dose Chronic diastolic congestive heart failure - continue Lasix and Digoxin Atrial Fibrillation - continue Xarelto and Diltiazem Dementia - stable Constipation - continue Colace   CPT CODE: 94709  Jennifer Nielsen - NP [facility] [phone]

## 2013-11-09 DIAGNOSIS — F329 Major depressive disorder, single episode, unspecified: Secondary | ICD-10-CM | POA: Diagnosis not present

## 2013-11-09 DIAGNOSIS — F3289 Other specified depressive episodes: Secondary | ICD-10-CM | POA: Diagnosis not present

## 2013-11-09 DIAGNOSIS — F29 Unspecified psychosis not due to a substance or known physiological condition: Secondary | ICD-10-CM | POA: Diagnosis not present

## 2013-11-19 ENCOUNTER — Encounter: Payer: Self-pay | Admitting: *Deleted

## 2013-11-20 DIAGNOSIS — H6120 Impacted cerumen, unspecified ear: Secondary | ICD-10-CM | POA: Diagnosis not present

## 2013-11-20 DIAGNOSIS — H918X9 Other specified hearing loss, unspecified ear: Secondary | ICD-10-CM | POA: Diagnosis not present

## 2013-11-22 ENCOUNTER — Non-Acute Institutional Stay (SKILLED_NURSING_FACILITY): Payer: Medicare Other | Admitting: Internal Medicine

## 2013-11-22 DIAGNOSIS — I4891 Unspecified atrial fibrillation: Secondary | ICD-10-CM

## 2013-11-22 DIAGNOSIS — M1990 Unspecified osteoarthritis, unspecified site: Secondary | ICD-10-CM | POA: Diagnosis not present

## 2013-11-22 DIAGNOSIS — F0390 Unspecified dementia without behavioral disturbance: Secondary | ICD-10-CM | POA: Diagnosis not present

## 2013-11-22 DIAGNOSIS — I509 Heart failure, unspecified: Secondary | ICD-10-CM

## 2013-11-22 NOTE — Progress Notes (Signed)
        PROGRESS NOTE  DATE: 11-22-13  FACILITY: [facility]  LEVEL OF CARE: SNF  Routine Visit  CHIEF COMPLAINT:  Manage CHF, atrial fibrillation & dementia  HISTORY OF PRESENT ILLNESS:  REASSESSMENT OF ONGOING PROBLEMS:  CHF:The patient does not relate significant weight changes, denies sob, DOE, orthopnea, PNDs, pedal edema, palpitations or chest pain.  CHF remains stable.  No complications form the medications being used.  ATRIAL FIBRILLATION: the patients a-fib remains stable.  The patient denies DOE, tachycardia, orthopnea, transient neurological sx, pedal edema, palpitations, & PNDs.  No complications noted from the medications currently being used.  DEMENTIA: The dementia remaines stable and continues to function adequately in the current living environment with supervision.  The patient has had little changes in behavior. No complications noted from the medications presently being used.  PAST MEDICAL HISTORY : Reviewed.  No changes.  CURRENT MEDICATIONS: Reviewed per [facility]  REVIEW OF SYSTEMS:  GENERAL: no change in appetite, no fatigue, no weight changes, no fever, chills or weakness RESPIRATORY: no cough, SOB, DOE,, wheezing, hemoptysis CARDIAC: no chest pain, edema or palpitations GI: no abdominal pain, diarrhea, constipation, heart burn, nausea or vomiting  PHYSICAL EXAMINATION  VS: see VS section  GENERAL: no acute distress, moderately obese body habitus EYES: Normal sclerae, normal conjunctivae, no discharge NECK: supple, trachea midline, no neck masses, no thyroid tenderness, no thyromegaly LYMPHATICS: No cervical lymphadenopathy, no supraclavicular lymphadenopathy RESPIRATORY: breathing is even & unlabored, BS CTAB CARDIAC: Heart rate is irregularly irregular, no murmur,no extra heart sounds, no edema GI: abdomen soft, normal BS, no masses, no tenderness, no hepatomegaly, no splenomegaly PSYCHIATRIC: the patient is alert & oriented to person, affect &  behavior appropriate  LABS/RADIOLOGY: 4-15 CBC normal, chloride 90, CO2 39 otherwise BMP normal 3-15 TSH 2.758 8-14 CO2 33, glucose 137, albumin 3.4 otherwise CMP normal, CBC normal  6/14 digoxin level 0.9, hemoglobin A1c 5.6, Depakote level 40.4  2/14 CBC normal, glucose 100, total protein 5.8 otherwise CMP normal, digoxin level 0.5 11/13 Depakote level 65.7  ASSESSMENT/PLAN:  CHF-compensated atrial fibrillation-rate controlled. dementia-stable. osteoarthritis-denies pain. GERD-stable. Constipation-well controlled Hypokalemia-continue supplementation Check a liver profile, digoxin level and Depakote level  CPT CODE: 68088  Edgar Frisk. Durwin Reges, [place] [phone]

## 2013-11-26 DIAGNOSIS — Z79899 Other long term (current) drug therapy: Secondary | ICD-10-CM | POA: Diagnosis not present

## 2013-11-29 ENCOUNTER — Ambulatory Visit (INDEPENDENT_AMBULATORY_CARE_PROVIDER_SITE_OTHER): Payer: Medicare Other | Admitting: Cardiology

## 2013-11-29 ENCOUNTER — Encounter: Payer: Self-pay | Admitting: Cardiology

## 2013-11-29 VITALS — BP 116/74 | HR 69

## 2013-11-29 DIAGNOSIS — I5032 Chronic diastolic (congestive) heart failure: Secondary | ICD-10-CM

## 2013-11-29 DIAGNOSIS — I509 Heart failure, unspecified: Secondary | ICD-10-CM

## 2013-11-29 DIAGNOSIS — I5033 Acute on chronic diastolic (congestive) heart failure: Secondary | ICD-10-CM | POA: Diagnosis not present

## 2013-11-29 DIAGNOSIS — J9602 Acute respiratory failure with hypercapnia: Secondary | ICD-10-CM

## 2013-11-29 DIAGNOSIS — J189 Pneumonia, unspecified organism: Secondary | ICD-10-CM

## 2013-11-29 DIAGNOSIS — J9600 Acute respiratory failure, unspecified whether with hypoxia or hypercapnia: Secondary | ICD-10-CM

## 2013-11-29 DIAGNOSIS — E871 Hypo-osmolality and hyponatremia: Secondary | ICD-10-CM

## 2013-11-29 DIAGNOSIS — J441 Chronic obstructive pulmonary disease with (acute) exacerbation: Secondary | ICD-10-CM

## 2013-11-29 MED ORDER — FLUTICASONE-SALMETEROL 250-50 MCG/DOSE IN AEPB: 1.0000 | INHALATION_SPRAY | Freq: Two times a day (BID) | RESPIRATORY_TRACT | Status: DC

## 2013-11-29 MED ORDER — POTASSIUM CHLORIDE CRYS ER 20 MEQ PO TBCR: 20.0000 meq | EXTENDED_RELEASE_TABLET | Freq: Every day | ORAL | Status: DC

## 2013-11-29 MED ORDER — FUROSEMIDE 40 MG PO TABS: 40.0000 mg | ORAL_TABLET | Freq: Two times a day (BID) | ORAL | Status: DC

## 2013-11-29 NOTE — Progress Notes (Signed)
Patient ID: Jennifer Nielsen, female   DOB: Feb 07, 1925, 80 y.o.   MRN: [id number]    Patient Name: Jennifer Nielsen Date of Encounter: 11/29/2013  Primary Care Provider:  [name], MD Primary Cardiologist:  Dorothy Spark   Problem List   Past Medical History  Diagnosis Date  . Arrhythmia   . Arthritis   . Atrial fibrillation     Remains stable (As of 05/09/13)  . H/O blood clots   . Hyperlipemia   . Dementia     Remains stable & continues to function adequately in the current living environment with supervision. Has had little changes in behavior (AS OF 05/09/13)  . GERD (gastroesophageal reflux disease)     Stable (As of 05/09/13)  . Osteoarthritis     Denies pain (As of 05/09/13)  . CHF (congestive heart failure)     chronic diastolic  . COPD (chronic obstructive pulmonary disease)     w/exacerbation   Past Surgical History  Procedure Laterality Date  . Appendectomy    . Gallbladder surgery    . Hip surgery      Right-Metal plate   . Leg surgery      Left leg    Allergies  Allergies  Allergen Reactions  . Chocolate     unknown  . Ciprofloxacin     unknown  . Coffee Bean Extract [Coffea Arabica]     unknown  . Gentamycin [Gentamicin]     unknown  . Imodium [Loperamide]     unknown  . Penicillins     unknown  . Sulfa Antibiotics     unknown  . Tea     unknown    HPI  A 80 year old female with h/o very mild dementia and chronic atrial fibrillation on chronic anticoagulation that has been managed by PCP. The patient has been rate controlled with diltiazem and digoxin. She has been very active taking care of herself until 2 years ago when she broke her hip and has been in wheelchair since then. She denies any falls. She is coming for a opinion on new anticoagulants as she has been tired of diet restrictions with coumadine. She denies any prior bleeding events. She denies any prior chest pain or palpitations. Since she made this appointment she started to  develop lower extremity edema. This started about 2-4 days ago. She admit to a "stable SOB" that she attributes to COPD. She stopped smoking 50 years ago.  The patient is coming after 3 weeks, and states that her LE edema has improved. She denies resting SOB or CP. No orthopnea or PND. She doesn't walk and lives in a nursing home.  The patient is coming back after 6 weeks and has no complaints, she denies chest pain, shortness of breath or lower extremity edema. She has no palpitations or syncope. She denies any falls. She has noticed to have bruising after starting Xarelto.  The patient is coming after 3 months, she reports that she has been hospitalized with COPD exacerbation. She has been feeling okay but has noticed worsening lower extremity edema. She denies any chest pain shortness of breath at rest, orthopnea or personal nocturnal dyspnea.  Home Medications  Prior to Admission medications   Medication Sig Start Date End Date Taking? Authorizing Provider  acetaminophen (TYLENOL) 500 MG tablet Take 500 mg by mouth every 6 (six) hours as needed.   Yes Historical Provider, MD  Calcium Carbonate-Vitamin D (CALTRATE 600+D PO) Take by mouth daily.   Yes  Historical Provider, MD  digoxin (LANOXIN) 0.25 MG tablet Take by mouth daily.    Yes Historical Provider, MD  diltiazem (DILACOR XR) 240 MG 24 hr capsule Take 240 mg by mouth daily.   Yes Historical Provider, MD  divalproex (DEPAKOTE) 125 MG DR tablet Take 125 mg by mouth 2 (two) times daily.   Yes Historical Provider, MD  omeprazole (PRILOSEC) 20 MG capsule Take 20 mg by mouth daily.   Yes Historical Provider, MD  Protein (PROCEL) POWD Take by mouth as directed.   Yes Historical Provider, MD  risperiDONE (RISPERDAL) 0.25 MG tablet Take 0.25 mg by mouth 2 (two) times daily.   Yes Historical Provider, MD  warfarin (COUMADIN) 2.5 MG tablet Take 1 tablet (2.5 mg total) by mouth daily. 09/05/12  Yes Monina C Medina-Vargas, NP    Family  History  Family History  Problem Relation Age of Onset  . Colon cancer Neg Hx   . Heart disease Father   . Breast cancer Daughter     Social History  History   Social History  . Marital Status: Single    Spouse Name: N/A    Number of Children: 2  . Years of Education: N/A   Occupational History  . Retired     Pharmacist, hospital   Social History Main Topics  . Smoking status: Former Research scientist (life sciences)  . Smokeless tobacco: Never Used  . Alcohol Use: No  . Drug Use: No  . Sexual Activity: No   Other Topics Concern  . Not on file   Social History Narrative   No caffeine      Review of Systems, as per HPI, otherwise negative General:  No chills, fever, night sweats or weight changes.  Cardiovascular:  No chest pain, dyspnea on exertion, edema, orthopnea, palpitations, paroxysmal nocturnal dyspnea. Dermatological: No rash, lesions/masses Respiratory: No cough, dyspnea Urologic: No hematuria, dysuria Abdominal:   No nausea, vomiting, diarrhea, bright red blood per rectum, melena, or hematemesis Neurologic:  No visual changes, wkns, changes in mental status. All other systems reviewed and are otherwise negative except as noted above.  Physical Exam BP 136/74, HR 77 BPM Blood pressure 116/74, pulse 69.  General: Pleasant, NAD Psych: Normal affect. Neuro: Alert and oriented X 3. Moves all extremities spontaneously. HEENT: Normal  Neck: Supple without bruits or JVD. Lungs:  Resp regular and unlabored, crackles at bases. Heart: RRR no s3, s4, or murmurs. Abdomen: Soft, non-tender, non-distended, BS + x 4.  Extremities: No clubbing, cyanosis, mild pitting edema up to the mid thighs B/L. DP/PT/Radials 2+ and equal bilaterally.  Labs:  No results found for this basename: CKTOTAL, CKMB, TROPONINI,  in the last 72 hours Lab Results  Component Value Date   WBC 9.2 09/25/2013   HGB 12.8 09/25/2013   HCT 41.0 09/25/2013   MCV 97.6 09/25/2013   PLT 210 09/25/2013   Accessory Clinical  Findings  Echocardiogram - 06/26/2013 - Left ventricle: The cavity size was normal. Wall thickness was increased in a pattern of mild LVH. Systolic function was normal. The estimated ejection fraction was in the range of 55% to 60%. Wall motion was normal; there were no regional wall motion abnormalities. - Aortic valve: Trivial regurgitation. - Left atrium: The atrium was mildly to moderately dilated. - Right ventricle: The cavity size was mildly dilated. - Right atrium: The atrium was mildly dilated. - Pulmonary arteries: PA peak pressure: 23mm Hg (S). Impressions:  - No prior study for comparison.  ECG - atrial fibrillation, LPFB  Assessment & Plan  80 year old female   1. Chronic atrial fibrillation - rate controlled with diltiazem and digoxin. We switched to Xarelto 20 mg PO daily, she has normal kidney function and weighs 225 lbs. A-fib is rate controlled. Low fall risk as she doesn't walk.  2. New onset CHF, significant fluid overload - significantly improved on Lasix 40 mg po at first now worsening again. We'll increase Lasix to 40 mg by mouth twice a day and increase potassium chloride to 20 mEq daily. check BMP in 2 weeks. Echo showed preserved LVEF, mild pulmonary hypertension.   3. COPD - no inhaled steroids, we'll start Advair 250/50 2 puffs twice a day  Follow up in 6 weeks.    Dorothy Spark, MD, [facility] 11/29/2013, 12:21 PM

## 2013-11-29 NOTE — Patient Instructions (Signed)
Your physician has recommended you make the following change in your medication:   TAKE LASIX 40 MG BID  TAKE POTASSIUM 20 MEQ DAILY  TAKE ADVAIR 250/50 MG BID  Your physician recommends that you return for lab work in: [place] (BMET)  Your physician recommends that you schedule a follow-up appointment in: [place]

## 2013-12-10 DIAGNOSIS — F22 Delusional disorders: Secondary | ICD-10-CM | POA: Diagnosis not present

## 2013-12-10 DIAGNOSIS — F0281 Dementia in other diseases classified elsewhere with behavioral disturbance: Secondary | ICD-10-CM | POA: Diagnosis not present

## 2013-12-10 DIAGNOSIS — F02818 Dementia in other diseases classified elsewhere, unspecified severity, with other behavioral disturbance: Secondary | ICD-10-CM | POA: Diagnosis not present

## 2013-12-13 ENCOUNTER — Other Ambulatory Visit: Payer: Medicare Other

## 2013-12-17 DIAGNOSIS — D649 Anemia, unspecified: Secondary | ICD-10-CM | POA: Diagnosis not present

## 2013-12-25 DIAGNOSIS — N390 Urinary tract infection, site not specified: Secondary | ICD-10-CM | POA: Diagnosis not present

## 2013-12-26 ENCOUNTER — Non-Acute Institutional Stay (SKILLED_NURSING_FACILITY): Payer: Medicare Other | Admitting: Adult Health

## 2013-12-26 ENCOUNTER — Encounter: Payer: Self-pay | Admitting: Adult Health

## 2013-12-26 DIAGNOSIS — K5900 Constipation, unspecified: Secondary | ICD-10-CM

## 2013-12-26 DIAGNOSIS — F29 Unspecified psychosis not due to a substance or known physiological condition: Secondary | ICD-10-CM

## 2013-12-26 DIAGNOSIS — I509 Heart failure, unspecified: Secondary | ICD-10-CM

## 2013-12-26 DIAGNOSIS — M1990 Unspecified osteoarthritis, unspecified site: Secondary | ICD-10-CM

## 2013-12-26 DIAGNOSIS — F0390 Unspecified dementia without behavioral disturbance: Secondary | ICD-10-CM | POA: Diagnosis not present

## 2013-12-26 DIAGNOSIS — I4891 Unspecified atrial fibrillation: Secondary | ICD-10-CM | POA: Diagnosis not present

## 2013-12-26 DIAGNOSIS — E876 Hypokalemia: Secondary | ICD-10-CM

## 2013-12-26 DIAGNOSIS — I482 Chronic atrial fibrillation, unspecified: Secondary | ICD-10-CM

## 2013-12-26 DIAGNOSIS — J438 Other emphysema: Secondary | ICD-10-CM

## 2013-12-26 DIAGNOSIS — I5032 Chronic diastolic (congestive) heart failure: Secondary | ICD-10-CM

## 2013-12-26 NOTE — Progress Notes (Signed)
Patient ID: Jennifer Nielsen, female   DOB: 1925-02-15, 80 y.o.   MRN: [id number]        PROGRESS NOTE  DATE:    12/26/13  FACILITY:   [facility]  LEVEL OF CARE: SNF  Routine Visit  CHIEF COMPLAINT:  Manage CHF, atrial fibrillation & dementia  HISTORY OF PRESENT ILLNESS:  REASSESSMENT OF ONGOING PROBLEMS:  HYPOKALEMIA: The patient's hypokalemia remains stable. Patient denies muscle cramping or palpitations. No complications reported from current potassium supplementation. 7/15 K 4.6  COPD: the COPD remains stable.  Pt denies sob, cough, wheezing or declining exercise tolerance.  No complications from the medications presently being used.  ATRIAL FIBRILLATION: the patients atrial fibrillation remains stable.  The patient denies DOE, tachycardia, orthopnea, transient neurological sx, pedal edema, palpitations, & PNDs.  No complications noted from the medications currently being used.  CHF:The patient does not relate significant weight changes, denies sob, DOE, orthopnea, PNDs, pedal edema, palpitations or chest pain.  CHF remains stable.  No complications form the medications being used.   PAST MEDICAL HISTORY : Reviewed.  No changes.  CURRENT MEDICATIONS: Reviewed per [facility]  REVIEW OF SYSTEMS:  GENERAL: no change in appetite, no fatigue, no weight changes, no fever, chills or weakness RESPIRATORY: no cough, SOB, DOE,, wheezing, hemoptysis CARDIAC: no chest pain, edema or palpitations GI: no abdominal pain, diarrhea, constipation, heart burn, nausea or vomiting  PHYSICAL EXAMINATION  GENERAL: no acute distress, moderately obese body habitus NECK: supple, trachea midline, no neck masses, no thyroid tenderness, no thyromegaly LYMPHATICS: No cervical lymphadenopathy, no supraclavicular lymphadenopathy RESPIRATORY: breathing is even & unlabored, BS CTAB CARDIAC: Heart rate is irregularly irregular, no murmur,no extra heart sounds, no edema GI: abdomen soft, normal BS, no masses, no  tenderness, no hepatomegaly, no splenomegaly PSYCHIATRIC: the patient is alert & oriented to person, affect & behavior appropriate  LABS/RADIOLOGY: 12/17/13  sodium 137 potassium 4.6 glucose 86 BUN 19 creatinine 0.7 calcium 8.7 11/26/13  sodium 136 potassium 4.3 close 86 BUN 19 creatinine 0.6 calcium 8.6 4-15 CBC normal, chloride 90, CO2 39 otherwise BMP normal 3-15 TSH 2.758 8-14 CO2 33, glucose 137, albumin 3.4 otherwise CMP normal, CBC normal 6/14 digoxin level 0.9, hemoglobin A1c 5.6, Depakote level 40.4 2/14 CBC normal, glucose 100, total protein 5.8 otherwise CMP normal, digoxin level 0.5 11/13 Depakote level 65.7  ASSESSMENT/PLAN:  Chronic diastolic heart failure - compensated; continue digoxin and Lasix Atrial fibrillation - rate controlled; continue diltiazem and Xarelto.  Dementia - stable. Osteoarthritis - denies pain; continue Tylenol Constipation - well controlled; continue Colace Hypokalemia - continue supplementation COPD - stable; continue Symbicort Psychosis - the patient has been diffusing Lasix and digoxin for several days and believes that she doesn't need the medication; start Zyprexa 2.5 mg 1 tab by mouth each bedtime   CPT CODE: 65993  Owyn Raulston Vargas - NP [facility] [phone]

## 2013-12-27 DIAGNOSIS — F22 Delusional disorders: Secondary | ICD-10-CM | POA: Diagnosis not present

## 2013-12-27 DIAGNOSIS — F02818 Dementia in other diseases classified elsewhere, unspecified severity, with other behavioral disturbance: Secondary | ICD-10-CM | POA: Diagnosis not present

## 2013-12-27 DIAGNOSIS — F0281 Dementia in other diseases classified elsewhere with behavioral disturbance: Secondary | ICD-10-CM | POA: Diagnosis not present

## 2013-12-28 DIAGNOSIS — F3289 Other specified depressive episodes: Secondary | ICD-10-CM | POA: Diagnosis not present

## 2013-12-28 DIAGNOSIS — F329 Major depressive disorder, single episode, unspecified: Secondary | ICD-10-CM | POA: Diagnosis not present

## 2013-12-28 DIAGNOSIS — F02818 Dementia in other diseases classified elsewhere, unspecified severity, with other behavioral disturbance: Secondary | ICD-10-CM | POA: Diagnosis not present

## 2013-12-28 DIAGNOSIS — F22 Delusional disorders: Secondary | ICD-10-CM | POA: Diagnosis not present

## 2013-12-28 DIAGNOSIS — F0281 Dementia in other diseases classified elsewhere with behavioral disturbance: Secondary | ICD-10-CM | POA: Diagnosis not present

## 2014-01-02 DIAGNOSIS — S72143A Displaced intertrochanteric fracture of unspecified femur, initial encounter for closed fracture: Secondary | ICD-10-CM | POA: Diagnosis not present

## 2014-01-02 DIAGNOSIS — L608 Other nail disorders: Secondary | ICD-10-CM | POA: Diagnosis not present

## 2014-01-02 DIAGNOSIS — M2040 Other hammer toe(s) (acquired), unspecified foot: Secondary | ICD-10-CM | POA: Diagnosis not present

## 2014-01-02 DIAGNOSIS — S32409A Unspecified fracture of unspecified acetabulum, initial encounter for closed fracture: Secondary | ICD-10-CM | POA: Diagnosis not present

## 2014-01-10 DIAGNOSIS — F0281 Dementia in other diseases classified elsewhere with behavioral disturbance: Secondary | ICD-10-CM | POA: Diagnosis not present

## 2014-01-10 DIAGNOSIS — F02818 Dementia in other diseases classified elsewhere, unspecified severity, with other behavioral disturbance: Secondary | ICD-10-CM | POA: Diagnosis not present

## 2014-01-10 DIAGNOSIS — F22 Delusional disorders: Secondary | ICD-10-CM | POA: Diagnosis not present

## 2014-01-18 DIAGNOSIS — F329 Major depressive disorder, single episode, unspecified: Secondary | ICD-10-CM | POA: Diagnosis not present

## 2014-01-18 DIAGNOSIS — F3289 Other specified depressive episodes: Secondary | ICD-10-CM | POA: Diagnosis not present

## 2014-01-18 DIAGNOSIS — R0602 Shortness of breath: Secondary | ICD-10-CM | POA: Diagnosis not present

## 2014-01-18 DIAGNOSIS — F22 Delusional disorders: Secondary | ICD-10-CM | POA: Diagnosis not present

## 2014-01-18 DIAGNOSIS — F0281 Dementia in other diseases classified elsewhere with behavioral disturbance: Secondary | ICD-10-CM | POA: Diagnosis not present

## 2014-01-18 DIAGNOSIS — F02818 Dementia in other diseases classified elsewhere, unspecified severity, with other behavioral disturbance: Secondary | ICD-10-CM | POA: Diagnosis not present

## 2014-01-22 DIAGNOSIS — H18599 Other hereditary corneal dystrophies, unspecified eye: Secondary | ICD-10-CM | POA: Diagnosis not present

## 2014-01-22 DIAGNOSIS — H35369 Drusen (degenerative) of macula, unspecified eye: Secondary | ICD-10-CM | POA: Diagnosis not present

## 2014-01-22 DIAGNOSIS — H02839 Dermatochalasis of unspecified eye, unspecified eyelid: Secondary | ICD-10-CM | POA: Diagnosis not present

## 2014-01-22 DIAGNOSIS — H5210 Myopia, unspecified eye: Secondary | ICD-10-CM | POA: Diagnosis not present

## 2014-01-22 DIAGNOSIS — H2640 Unspecified secondary cataract: Secondary | ICD-10-CM | POA: Diagnosis not present

## 2014-01-22 DIAGNOSIS — H43819 Vitreous degeneration, unspecified eye: Secondary | ICD-10-CM | POA: Diagnosis not present

## 2014-01-25 DIAGNOSIS — F3289 Other specified depressive episodes: Secondary | ICD-10-CM | POA: Diagnosis not present

## 2014-01-25 DIAGNOSIS — F329 Major depressive disorder, single episode, unspecified: Secondary | ICD-10-CM | POA: Diagnosis not present

## 2014-01-25 DIAGNOSIS — F0281 Dementia in other diseases classified elsewhere with behavioral disturbance: Secondary | ICD-10-CM | POA: Diagnosis not present

## 2014-01-25 DIAGNOSIS — F22 Delusional disorders: Secondary | ICD-10-CM | POA: Diagnosis not present

## 2014-01-25 DIAGNOSIS — F02818 Dementia in other diseases classified elsewhere, unspecified severity, with other behavioral disturbance: Secondary | ICD-10-CM | POA: Diagnosis not present

## 2014-01-29 ENCOUNTER — Non-Acute Institutional Stay (SKILLED_NURSING_FACILITY): Payer: Medicare Other | Admitting: Adult Health

## 2014-01-29 ENCOUNTER — Encounter: Payer: Self-pay | Admitting: Adult Health

## 2014-01-29 DIAGNOSIS — I509 Heart failure, unspecified: Secondary | ICD-10-CM

## 2014-01-29 DIAGNOSIS — I4891 Unspecified atrial fibrillation: Secondary | ICD-10-CM

## 2014-01-29 DIAGNOSIS — F0390 Unspecified dementia without behavioral disturbance: Secondary | ICD-10-CM | POA: Diagnosis not present

## 2014-01-29 DIAGNOSIS — F29 Unspecified psychosis not due to a substance or known physiological condition: Secondary | ICD-10-CM

## 2014-01-29 DIAGNOSIS — J438 Other emphysema: Secondary | ICD-10-CM

## 2014-01-29 DIAGNOSIS — M1990 Unspecified osteoarthritis, unspecified site: Secondary | ICD-10-CM | POA: Diagnosis not present

## 2014-01-29 DIAGNOSIS — I5032 Chronic diastolic (congestive) heart failure: Secondary | ICD-10-CM

## 2014-01-29 DIAGNOSIS — E876 Hypokalemia: Secondary | ICD-10-CM

## 2014-01-29 DIAGNOSIS — K5900 Constipation, unspecified: Secondary | ICD-10-CM

## 2014-01-29 DIAGNOSIS — I482 Chronic atrial fibrillation, unspecified: Secondary | ICD-10-CM

## 2014-01-29 NOTE — Progress Notes (Signed)
Patient ID: Jennifer Nielsen, female   DOB: June 07, 1925, 80 y.o.   MRN: [id number]        PROGRESS NOTE  DATE:      01/29/14  FACILITY:   [facility]  LEVEL OF CARE: SNF  Routine Visit  CHIEF COMPLAINT:  Manage CHF, atrial fibrillation & dementia  HISTORY OF PRESENT ILLNESS:  REASSESSMENT OF ONGOING PROBLEMS:  DEMENTIA: The dementia remaines stable and continues to function adequately in the current living environment with supervision.  The patient has had little changes in behavior. No complications noted from the medications presently being used.  CONSTIPATION: The constipation remains stable. No complications from the medications presently being used. Patient denies ongoing constipation, abdominal pain, nausea or vomiting.  ATRIAL FIBRILLATION: the patients atrial fibrillation remains stable.  The patient denies DOE, tachycardia, orthopnea, transient neurological sx, pedal edema, palpitations, & PNDs.  No complications noted from the medications currently being used.  PAST MEDICAL HISTORY : Reviewed.  No changes.  CURRENT MEDICATIONS: Reviewed per [facility]  REVIEW OF SYSTEMS:  GENERAL: no change in appetite, no fatigue, no weight changes, no fever, chills or weakness RESPIRATORY: no cough, SOB, DOE,, wheezing, hemoptysis CARDIAC: no chest pain, edema or palpitations GI: no abdominal pain, diarrhea, constipation, heart burn, nausea or vomiting  PHYSICAL EXAMINATION  GENERAL: no acute distress, moderately obese body habitus EYES:  Lids open and close normally, no blepharitis, entropion or ectropion NECK: supple, trachea midline, no neck masses, no thyroid tenderness, no thyromegaly LYMPHATICS: No cervical lymphadenopathy, no supraclavicular lymphadenopathy RESPIRATORY: breathing is even & unlabored, BS CTAB CARDIAC: Heart rate is irregularly irregular, no murmur,no extra heart sounds, no edema GI: abdomen soft, normal BS, no masses, no tenderness, no hepatomegaly, no  splenomegaly EXTREMITIES:  Able to move all 4 extremities PSYCHIATRIC: the patient is alert & oriented to person, affect & behavior appropriate  LABS/RADIOLOGY: 01/18/14  BNP 91  sodium 138 potassium 4.3 glucose 79 BUN 19 creatinine 0.8 calcium 9.1 12/17/13  sodium 137 potassium 4.6 glucose 86 BUN 19 creatinine 0.7 calcium 8.7 11/26/13  sodium 136 potassium 4.3 close 86 BUN 19 creatinine 0.6 calcium 8.6 4-15 CBC normal, chloride 90, CO2 39 otherwise BMP normal 3-15 TSH 2.758 8-14 CO2 33, glucose 137, albumin 3.4 otherwise CMP normal, CBC normal 6/14 digoxin level 0.9, hemoglobin A1c 5.6, Depakote level 40.4 2/14 CBC normal, glucose 100, total protein 5.8 otherwise CMP normal, digoxin level 0.5 11/13 Depakote level 65.7  ASSESSMENT/PLAN:  Chronic diastolic heart failure - compensated; continue digoxin and Lasix Atrial fibrillation - rate controlled; continue diltiazem and Xarelto.  Dementia - stable. Osteoarthritis - denies pain; continue Tylenol Constipation - well controlled; continue Colace Hypokalemia - continue supplementation COPD - stable; continue Symbicort Psychosis -stable; continue Zyprexa    CPT CODE: 61607  Seth Bake - NP [facility] [phone]

## 2014-02-05 ENCOUNTER — Encounter: Payer: Self-pay | Admitting: Cardiology

## 2014-02-05 ENCOUNTER — Ambulatory Visit (INDEPENDENT_AMBULATORY_CARE_PROVIDER_SITE_OTHER): Payer: Medicare Other | Admitting: Cardiology

## 2014-02-05 VITALS — BP 124/64 | HR 63 | Wt 208.0 lb

## 2014-02-05 DIAGNOSIS — I5032 Chronic diastolic (congestive) heart failure: Secondary | ICD-10-CM

## 2014-02-05 MED ORDER — METOLAZONE 2.5 MG PO TABS: 2.5000 mg | ORAL_TABLET | Freq: Every day | ORAL | Status: DC

## 2014-02-05 MED ORDER — TORSEMIDE 20 MG PO TABS: 20.0000 mg | ORAL_TABLET | Freq: Two times a day (BID) | ORAL | Status: DC

## 2014-02-05 NOTE — Patient Instructions (Signed)
Your physician has recommended you make the following change in your medication:   START TAKING TORSEMIDE 20 MG TWICE DAILY  START TAKING METOLAZONE 2.5 MG DAILY  Your physician recommends that you schedule a follow-up appointment in: 2 MONTHS WITH DR NELSON  DR NELSON WOULD LIKE FOR YOU OR YOUR DAUGHTER TO CALL us BACK IN 2 WEEKS TO REPORT HOW YOU ARE FEELING

## 2014-02-05 NOTE — Progress Notes (Signed)
Patient ID: Jennifer Nielsen, female   DOB: 09-10-1924, 80 y.o.   MRN: [id number]    Patient Name: Jennifer Nielsen Date of Encounter: 02/05/2014  Primary Care Provider:  [name], MD Primary Cardiologist:  Dorothy Spark  Problem List   Past Medical History  Diagnosis Date  . Arrhythmia   . Arthritis   . Atrial fibrillation     Remains stable (As of 05/09/13)  . H/O blood clots   . Hyperlipemia   . Dementia     Remains stable & continues to function adequately in the current living environment with supervision. Has had little changes in behavior (AS OF 05/09/13)  . GERD (gastroesophageal reflux disease)     Stable (As of 05/09/13)  . Osteoarthritis     Denies pain (As of 05/09/13)  . CHF (congestive heart failure)     chronic diastolic  . COPD (chronic obstructive pulmonary disease)     w/exacerbation   Past Surgical History  Procedure Laterality Date  . Appendectomy    . Gallbladder surgery    . Hip surgery      Right-Metal plate   . Leg surgery      Left leg    Allergies  Allergies  Allergen Reactions  . Chocolate Itching    unknown  . Ciprofloxacin Itching    unknown  . Coffee Bean Extract [Coffea Arabica]     unknown  . Coffee Flavor Itching  . Gentamycin [Gentamicin] Itching    unknown  . Imodium [Loperamide]     unknown  . Penicillins Itching    unknown  . Sulfa Antibiotics Itching    unknown  . Tea Itching    unknown    HPI  A 80 year old female with h/o very mild dementia and chronic atrial fibrillation on chronic anticoagulation that has been managed by PCP. The patient has been rate controlled with diltiazem and digoxin. She has been very active taking care of herself until 2 years ago when she broke her hip and has been in wheelchair since then. She denies any falls. She is coming for a opinion on new anticoagulants as she has been tired of diet restrictions with coumadine. She denies any prior bleeding events. She denies any prior chest pain  or palpitations. Since she made this appointment she started to develop lower extremity edema. This started about 2-4 days ago. She admit to a "stable SOB" that she attributes to COPD. She stopped smoking 50 years ago.  The patient is coming after 3 weeks, and states that her LE edema has improved. She denies resting SOB or CP. No orthopnea or PND. She doesn't walk and lives in a nursing home.  The patient is coming back after 6 weeks and has no complaints, she denies chest pain, shortness of breath or lower extremity edema. She has no palpitations or syncope. She denies any falls. She has noticed to have bruising after starting Xarelto.  The patient is coming after 3 months, she reports that she has been hospitalized with COPD exacerbation. She has been feeling okay but has noticed worsening lower extremity edema. She denies any chest pain shortness of breath at rest, orthopnea or personal nocturnal dyspnea.  02/05/2014 - the patient is coming after 2 months she reports no improvement with diuretic and complains of rash in her inguinal area secondary to furosemide as she is allergic to sulfa drugs. She also states that her edema hasn't improved at all. She is compliant with Xarelto and reports no  bleeding, significant bruising, or blood per stool. She has no chest pain no palpitation or syncope.  Home Medications  Prior to Admission medications   Medication Sig Start Date End Date Taking? Authorizing Provider  acetaminophen (TYLENOL) 500 MG tablet Take 500 mg by mouth every 6 (six) hours as needed.   Yes Historical Provider, MD  Calcium Carbonate-Vitamin D (CALTRATE 600+D PO) Take by mouth daily.   Yes Historical Provider, MD  digoxin (LANOXIN) 0.25 MG tablet Take by mouth daily.    Yes Historical Provider, MD  diltiazem (DILACOR XR) 240 MG 24 hr capsule Take 240 mg by mouth daily.   Yes Historical Provider, MD  divalproex (DEPAKOTE) 125 MG DR tablet Take 125 mg by mouth 2 (two) times daily.   Yes  Historical Provider, MD  omeprazole (PRILOSEC) 20 MG capsule Take 20 mg by mouth daily.   Yes Historical Provider, MD  Protein (PROCEL) POWD Take by mouth as directed.   Yes Historical Provider, MD  risperiDONE (RISPERDAL) 0.25 MG tablet Take 0.25 mg by mouth 2 (two) times daily.   Yes Historical Provider, MD  warfarin (COUMADIN) 2.5 MG tablet Take 1 tablet (2.5 mg total) by mouth daily. 09/05/12  Yes Monina C Medina-Vargas, NP    Family History  Family History  Problem Relation Age of Onset  . Colon cancer Neg Hx   . Heart disease Father   . Breast cancer Daughter     Social History  History   Social History  . Marital Status: Single    Spouse Name: N/A    Number of Children: 2  . Years of Education: N/A   Occupational History  . Retired     Pharmacist, hospital   Social History Main Topics  . Smoking status: Former Research scientist (life sciences)  . Smokeless tobacco: Never Used  . Alcohol Use: No  . Drug Use: No  . Sexual Activity: No   Other Topics Concern  . Not on file   Social History Narrative   No caffeine      Review of Systems, as per HPI, otherwise negative General:  No chills, fever, night sweats or weight changes.  Cardiovascular:  No chest pain, dyspnea on exertion, edema, orthopnea, palpitations, paroxysmal nocturnal dyspnea. Dermatological: No rash, lesions/masses Respiratory: No cough, dyspnea Urologic: No hematuria, dysuria Abdominal:   No nausea, vomiting, diarrhea, bright red blood per rectum, melena, or hematemesis Neurologic:  No visual changes, wkns, changes in mental status. All other systems reviewed and are otherwise negative except as noted above.  Physical Exam BP 136/74, HR 77 BPM Blood pressure 124/64, pulse 63, SpO2 93.00%.  General: Pleasant, NAD Psych: Normal affect. Neuro: Alert and oriented X 3. Moves all extremities spontaneously. HEENT: Normal  Neck: Supple without bruits or JVD. Lungs:  Resp regular and unlabored, crackles at bases. Heart: RRR no s3, s4,  or murmurs. Abdomen: Soft, non-tender, non-distended, BS + x 4.  Extremities: No clubbing, cyanosis, mild pitting edema up to the mid thighs B/L. DP/PT/Radials 2+ and equal bilaterally.  Labs:  No results found for this basename: CKTOTAL, CKMB, TROPONINI,  in the last 72 hours Lab Results  Component Value Date   WBC 9.2 09/25/2013   HGB 12.8 09/25/2013   HCT 41.0 09/25/2013   MCV 97.6 09/25/2013   PLT 210 09/25/2013   Accessory Clinical Findings  Echocardiogram - 06/26/2013 - Left ventricle: The cavity size was normal. Wall thickness was increased in a pattern of mild LVH. Systolic function was normal. The estimated ejection fraction  was in the range of 55% to 60%. Wall motion was normal; there were no regional wall motion abnormalities. - Aortic valve: Trivial regurgitation. - Left atrium: The atrium was mildly to moderately dilated. - Right ventricle: The cavity size was mildly dilated. - Right atrium: The atrium was mildly dilated. - Pulmonary arteries: PA peak pressure: 70mm Hg (S). Impressions:  - No prior study for comparison.  ECG - atrial fibrillation, LPFB    Assessment & Plan  80 year old female   1. Chronic atrial fibrillation - rate controlled with diltiazem and digoxin. We switched to Xarelto 20 mg PO daily, she has normal kidney function and weighs 225 lbs. A-fib is rate controlled. Low fall risk as she doesn't walk.  2. New onset CHF, significant fluid overload - significantly improved on Lasix 40 mg po at first now worsening again, the patient believes she has rash secondary to furosemide. I discussed this with the pharmacist and there is a very small chance of) activity, though therefore start torsemide 20 mg by mouth twice a day and metolazone 2.5 mg daily. We'll call the patient in 2 weeks to verify the progress of her symptoms. And followup as needed. Echo showed preserved LVEF, mild pulmonary hypertension.   3. COPD - no inhaled steroids, we'll start Advair  250/50 2 puffs twice a day  Follow up in 2 months.    Dorothy Spark, MD, [facility] 02/05/2014, 12:28 PM

## 2014-02-08 DIAGNOSIS — F0281 Dementia in other diseases classified elsewhere with behavioral disturbance: Secondary | ICD-10-CM | POA: Diagnosis not present

## 2014-02-08 DIAGNOSIS — F329 Major depressive disorder, single episode, unspecified: Secondary | ICD-10-CM | POA: Diagnosis not present

## 2014-02-08 DIAGNOSIS — F02818 Dementia in other diseases classified elsewhere, unspecified severity, with other behavioral disturbance: Secondary | ICD-10-CM | POA: Diagnosis not present

## 2014-02-08 DIAGNOSIS — F3289 Other specified depressive episodes: Secondary | ICD-10-CM | POA: Diagnosis not present

## 2014-02-08 DIAGNOSIS — F22 Delusional disorders: Secondary | ICD-10-CM | POA: Diagnosis not present

## 2014-02-12 DIAGNOSIS — E875 Hyperkalemia: Secondary | ICD-10-CM | POA: Diagnosis not present

## 2014-02-15 ENCOUNTER — Telehealth: Payer: Self-pay | Admitting: Cardiology

## 2014-02-15 DIAGNOSIS — D649 Anemia, unspecified: Secondary | ICD-10-CM | POA: Diagnosis not present

## 2014-02-15 NOTE — Telephone Encounter (Signed)
New message     Calling to give lab results to Dr Meda Coffee

## 2014-02-15 NOTE — Telephone Encounter (Signed)
Quillian Quince from [facility] calling stating Ms.Craver had labs drawn today.  The NP is concerned that her CO2 has been steadily increasing.  Today was 35; 9/1 was 65; and 8/7 was 38.  He states she is in no distress, currently sitting in dining room eating.  Also when she was in office on 8/25 she was started on Torsemide 20 mg; and Metolazone 2.5 mg daily.  She is also taking Lasix 40 mg BID which does not appear on our encounter.  The nursing facility would like to know if they should continue the Lasix or if it should be discontinued.  He is faxing the labs and vital signs to [place]. Advised Dr. Meda Coffee has already left office but will forward message to her. Will address on Tues once we have received labs. Advised Quillian Quince someone would return call on Tuesday.

## 2014-02-19 ENCOUNTER — Encounter: Payer: Self-pay | Admitting: Cardiology

## 2014-02-19 DIAGNOSIS — D649 Anemia, unspecified: Secondary | ICD-10-CM | POA: Diagnosis not present

## 2014-02-20 NOTE — Telephone Encounter (Signed)
Can 1 of the PAs see her tomorrow or Friday?

## 2014-02-20 NOTE — Telephone Encounter (Signed)
Message copied by Nuala Alpha on Wed Feb 20, 2014 11:23 AM ------      Message from: Dorothy Spark      Created: Wed Feb 20, 2014 10:15 AM       Karlene Einstein,      Would you be able to call them and ask how is she doing clinically? Her CO2 is much better. If she is ok, I would continue holding Lasix and metolazone and only continue torsemide.      Thank you,      KN ------

## 2014-02-20 NOTE — Telephone Encounter (Signed)
Spoke with pts [facility] to inform them that we would like to see the pt in our office on Friday 02/22/14 at 11:45 am with NP Truitt Merle, per Dr Meda Coffee.  Spoke with pts nurse and informed her this information so she could arrange for transportation.  Nurse reports also that pts current V/S are O2 Sats- 91% RA, 149/78-67-18-97.6 Nurse reports pt is up in her w/c and is alert.  Nurse reports that she will arrange transportation for pt on 9/11 and will notify both Daughters of this appt.

## 2014-02-20 NOTE — Telephone Encounter (Signed)
Spoke with the pts nurse Satmata at [facility], to ask pts current health status today per Dr Meda Coffee.  Nurse Marvell Fuller states that the pts O2 sats have decreased to 84-88 % RA, and this has been confirmed x 2 nurses and 2 different pulse oximeters.  Nurse Satmata states the pt is refusing oxygen, with multiple attempts by different staff.  Nurse Marvell Fuller states that the pts breathing is not labored and lungs clear bilaterally.  Nurse states the pt is afebrile, and no cough noted.  Nurse states over the course of 4 days the pts appetite has decreased significantly, stating pt is refusing meals.  Nurse states they are encouraging PO fluids on the pt.  Nurse states that the pt is alert and oriented at times to person.  Nurse states the pt has no LEE.  Nurse state that the pt is currently at the beauty shop in the nursing home, getting her hair done.  Nurse Satmata states that the pt appears to be in no acute distress, but did note that the pt has some oral cyanosis and her finger tips are blue.  Nurse Satmata reports that she will call back with a full set of V/S when pt returns from beauty shop.  Nurse Marvell Fuller reports that in-house NP is aware of pts current health status.  Nurse Marvell Fuller states that they are continuing with holding her Lasix and metolazone, and giving her the Torsemide, until further advised. Advised nurse to contact our office with full vital signs, and continue with pulse ox monitoring and possible O2 therapy. Encourage PO fluids.  Informed the nurse that I will forward this message to Dr Meda Coffee for further review and recommendation and follow-up thereafter.  Nurse agrees with this plan and will call us back with full set of v/s.

## 2014-02-22 ENCOUNTER — Emergency Department (HOSPITAL_COMMUNITY): Payer: Medicare Other

## 2014-02-22 ENCOUNTER — Emergency Department (HOSPITAL_COMMUNITY)
Admission: EM | Admit: 2014-02-22 | Discharge: 2014-02-22 | Disposition: A | Discharge status: Home / Self Care | Payer: Medicare Other | Attending: Emergency Medicine | Admitting: Emergency Medicine

## 2014-02-22 ENCOUNTER — Encounter: Payer: Self-pay | Admitting: Nurse Practitioner

## 2014-02-22 ENCOUNTER — Ambulatory Visit (INDEPENDENT_AMBULATORY_CARE_PROVIDER_SITE_OTHER): Payer: 59 | Admitting: Nurse Practitioner

## 2014-02-22 ENCOUNTER — Encounter (HOSPITAL_COMMUNITY): Payer: Self-pay | Admitting: Emergency Medicine

## 2014-02-22 VITALS — BP 140/70 | HR 60 | Ht 67.0 in

## 2014-02-22 DIAGNOSIS — Z880 Allergy status to penicillin: Secondary | ICD-10-CM | POA: Diagnosis not present

## 2014-02-22 DIAGNOSIS — Z87891 Personal history of nicotine dependence: Secondary | ICD-10-CM | POA: Diagnosis not present

## 2014-02-22 DIAGNOSIS — I4891 Unspecified atrial fibrillation: Secondary | ICD-10-CM | POA: Diagnosis not present

## 2014-02-22 DIAGNOSIS — J441 Chronic obstructive pulmonary disease with (acute) exacerbation: Secondary | ICD-10-CM | POA: Insufficient documentation

## 2014-02-22 DIAGNOSIS — J449 Chronic obstructive pulmonary disease, unspecified: Secondary | ICD-10-CM | POA: Diagnosis not present

## 2014-02-22 DIAGNOSIS — K219 Gastro-esophageal reflux disease without esophagitis: Secondary | ICD-10-CM | POA: Diagnosis not present

## 2014-02-22 DIAGNOSIS — R892 Abnormal level of other drugs, medicaments and biological substances in specimens from other organs, systems and tissues: Secondary | ICD-10-CM | POA: Insufficient documentation

## 2014-02-22 DIAGNOSIS — T460X1A Poisoning by cardiac-stimulant glycosides and drugs of similar action, accidental (unintentional), initial encounter: Secondary | ICD-10-CM | POA: Diagnosis not present

## 2014-02-22 DIAGNOSIS — R0602 Shortness of breath: Secondary | ICD-10-CM | POA: Insufficient documentation

## 2014-02-22 DIAGNOSIS — R0989 Other specified symptoms and signs involving the circulatory and respiratory systems: Secondary | ICD-10-CM

## 2014-02-22 DIAGNOSIS — I482 Chronic atrial fibrillation, unspecified: Secondary | ICD-10-CM

## 2014-02-22 DIAGNOSIS — Z79899 Other long term (current) drug therapy: Secondary | ICD-10-CM | POA: Insufficient documentation

## 2014-02-22 DIAGNOSIS — R0682 Tachypnea, not elsewhere classified: Secondary | ICD-10-CM

## 2014-02-22 DIAGNOSIS — Z86718 Personal history of other venous thrombosis and embolism: Secondary | ICD-10-CM | POA: Insufficient documentation

## 2014-02-22 DIAGNOSIS — Z7901 Long term (current) use of anticoagulants: Secondary | ICD-10-CM | POA: Insufficient documentation

## 2014-02-22 DIAGNOSIS — R0609 Other forms of dyspnea: Secondary | ICD-10-CM | POA: Diagnosis not present

## 2014-02-22 DIAGNOSIS — R0600 Dyspnea, unspecified: Secondary | ICD-10-CM

## 2014-02-22 DIAGNOSIS — R7889 Finding of other specified substances, not normally found in blood: Secondary | ICD-10-CM

## 2014-02-22 DIAGNOSIS — F0390 Unspecified dementia without behavioral disturbance: Secondary | ICD-10-CM | POA: Diagnosis not present

## 2014-02-22 DIAGNOSIS — M1990 Unspecified osteoarthritis, unspecified site: Secondary | ICD-10-CM | POA: Diagnosis not present

## 2014-02-22 DIAGNOSIS — J984 Other disorders of lung: Secondary | ICD-10-CM | POA: Diagnosis not present

## 2014-02-22 DIAGNOSIS — R0689 Other abnormalities of breathing: Secondary | ICD-10-CM

## 2014-02-22 DIAGNOSIS — I5032 Chronic diastolic (congestive) heart failure: Secondary | ICD-10-CM | POA: Diagnosis not present

## 2014-02-22 DIAGNOSIS — IMO0002 Reserved for concepts with insufficient information to code with codable children: Secondary | ICD-10-CM | POA: Insufficient documentation

## 2014-02-22 DIAGNOSIS — E873 Alkalosis: Secondary | ICD-10-CM | POA: Insufficient documentation

## 2014-02-22 DIAGNOSIS — R6889 Other general symptoms and signs: Secondary | ICD-10-CM | POA: Diagnosis not present

## 2014-02-22 LAB — I-STAT ARTERIAL BLOOD GAS, ED
ACID-BASE EXCESS: 19 mmol/L — AB (ref 0.0–2.0)
BICARBONATE: 42 meq/L — AB (ref 20.0–24.0)
O2 SAT: 97 %
PO2 ART: 73 mmHg — AB (ref 80.0–100.0)
TCO2: 43 mmol/L (ref 0–100)
pCO2 arterial: 39.7 mmHg (ref 35.0–45.0)
pH, Arterial: 7.632 (ref 7.350–7.450)

## 2014-02-22 LAB — CBC
HCT: 43.9 % (ref 36.0–46.0)
Hemoglobin: 14.3 g/dL (ref 12.0–15.0)
MCH: 30.7 pg (ref 26.0–34.0)
MCHC: 32.6 g/dL (ref 30.0–36.0)
MCV: 94.2 fL (ref 78.0–100.0)
Platelets: 308 10*3/uL (ref 150–400)
RBC: 4.66 MIL/uL (ref 3.87–5.11)
RDW: 16.9 % — ABNORMAL HIGH (ref 11.5–15.5)
WBC: 8.4 10*3/uL (ref 4.0–10.5)

## 2014-02-22 LAB — COMPREHENSIVE METABOLIC PANEL
ALBUMIN: 3.2 g/dL — AB (ref 3.5–5.2)
ALK PHOS: 82 U/L (ref 39–117)
ALT: 22 U/L (ref 0–35)
AST: 34 U/L (ref 0–37)
Anion gap: 16 — ABNORMAL HIGH (ref 5–15)
BILIRUBIN TOTAL: 0.6 mg/dL (ref 0.3–1.2)
BUN: 29 mg/dL — ABNORMAL HIGH (ref 6–23)
CHLORIDE: 87 meq/L — AB (ref 96–112)
CO2: 33 meq/L — AB (ref 19–32)
CREATININE: 0.78 mg/dL (ref 0.50–1.10)
Calcium: 9.6 mg/dL (ref 8.4–10.5)
GFR calc Af Amer: 85 mL/min — ABNORMAL LOW (ref >90)
GFR, EST NON AFRICAN AMERICAN: 73 mL/min — AB (ref >90)
Glucose, Bld: 84 mg/dL (ref 70–99)
POTASSIUM: 4.7 meq/L (ref 3.7–5.3)
Sodium: 136 mEq/L — ABNORMAL LOW (ref 137–147)
Total Protein: 7.3 g/dL (ref 6.0–8.3)

## 2014-02-22 LAB — URINALYSIS, ROUTINE W REFLEX MICROSCOPIC
Bilirubin Urine: NEGATIVE
GLUCOSE, UA: NEGATIVE mg/dL
Hgb urine dipstick: NEGATIVE
KETONES UR: NEGATIVE mg/dL
Leukocytes, UA: NEGATIVE
Nitrite: NEGATIVE
Protein, ur: NEGATIVE mg/dL
Specific Gravity, Urine: 1.015 (ref 1.005–1.030)
Urobilinogen, UA: 1 mg/dL (ref 0.0–1.0)
pH: 7.5 (ref 5.0–8.0)

## 2014-02-22 LAB — CBC WITH DIFFERENTIAL/PLATELET
BASOS PCT: 0 % (ref 0–1)
Basophils Absolute: 0 10*3/uL (ref 0.0–0.1)
Eosinophils Absolute: 0.1 10*3/uL (ref 0.0–0.7)
Eosinophils Relative: 1 % (ref 0–5)
HEMATOCRIT: 45.2 % (ref 36.0–46.0)
HEMOGLOBIN: 14.9 g/dL (ref 12.0–15.0)
LYMPHS PCT: 27 % (ref 12–46)
Lymphs Abs: 2.2 10*3/uL (ref 0.7–4.0)
MCH: 31.6 pg (ref 26.0–34.0)
MCHC: 33 g/dL (ref 30.0–36.0)
MCV: 95.8 fL (ref 78.0–100.0)
MONO ABS: 0.7 10*3/uL (ref 0.1–1.0)
Monocytes Relative: 8 % (ref 3–12)
NEUTROS ABS: 5.2 10*3/uL (ref 1.7–7.7)
NEUTROS PCT: 64 % (ref 43–77)
Platelets: 288 10*3/uL (ref 150–400)
RBC: 4.72 MIL/uL (ref 3.87–5.11)
RDW: 16.5 % — ABNORMAL HIGH (ref 11.5–15.5)
WBC: 8.3 10*3/uL (ref 4.0–10.5)

## 2014-02-22 LAB — I-STAT TROPONIN, ED: Troponin i, poc: 0.03 ng/mL (ref 0.00–0.08)

## 2014-02-22 LAB — BRAIN NATRIURETIC PEPTIDE: Brain Natriuretic Peptide: 121.6 pg/mL — ABNORMAL HIGH (ref 0.0–100.0)

## 2014-02-22 LAB — DIGOXIN LEVEL
Digoxin Level: 2.21 ng/mL — ABNORMAL HIGH (ref 0.8–2.0)
Digoxin Level: 2.2 ng/mL — ABNORMAL HIGH (ref 0.8–2.0)

## 2014-02-22 LAB — BASIC METABOLIC PANEL
BUN: 28 mg/dL — ABNORMAL HIGH (ref 6–23)
CO2: 38 mEq/L — ABNORMAL HIGH (ref 19–32)
Calcium: 9.6 mg/dL (ref 8.4–10.5)
Chloride: 84 mEq/L — ABNORMAL LOW (ref 96–112)
Creat: 0.8 mg/dL (ref 0.50–1.10)
Glucose, Bld: 117 mg/dL — ABNORMAL HIGH (ref 70–99)
Potassium: 4.1 mEq/L (ref 3.5–5.3)
Sodium: 136 mEq/L (ref 135–145)

## 2014-02-22 LAB — PRO B NATRIURETIC PEPTIDE: PRO B NATRI PEPTIDE: 2244 pg/mL — AB (ref 0–450)

## 2014-02-22 MED ORDER — FUROSEMIDE 10 MG/ML IJ SOLN
30.0000 mg | Freq: Once | INTRAMUSCULAR | Status: AC
  Administered 2014-02-22: 30 mg via INTRAVENOUS
  Filled 2014-02-22: qty 4

## 2014-02-22 NOTE — ED Notes (Signed)
Pt in gown on monitor at this time. Pt currently in a fib at a rate of 50. O2 sats at 99 on room air. Pt is alert and oriented. Pt tachypnic. Hx of COPD and CHF.

## 2014-02-22 NOTE — ED Notes (Signed)
[facility] called for transport; meal tray ordered by Gregor Hams

## 2014-02-22 NOTE — ED Notes (Signed)
Meal tray set up at bedside; pt awaiting [facility] for transportation

## 2014-02-22 NOTE — Progress Notes (Signed)
Jennifer Nielsen Date of Birth: [birth year] Medical Record #[id number]  History of Present Illness: Jennifer Nielsen is seen today for a work in visit. Seen for Dr. Meda Coffee. She is an 80 year old female who resides at [facility]. She has OA, chronic AF, dementia, GERD, COPD and chronic diastolic HF. She is on chronic anticoagulation. Broke her hip 2 years ago - and is wheelchair bound. Last echo from January of 2015 with EF of 55 to 60%.   Discharged from [facility] in April with respiratory failure. Noted to be a DNR.   She was seen here in August - wanted to discuss changing her Coumadin to a NOAC. Switched to [facility]. Felt to be volume overloaded as well - lasix started but then had ?allergic rash and was switched to Torsemide and metolazone. Also given Advair for her COPD.  Numerous phone calls since her last visit - has had worsening CO2 levels - 38 to 45 then to 55. She was noted to have had this back in April during her admission at that time with respiratory failure. Oxygen sats variable. Patient has refused oxygen. Does not use CPAP. No history of sleep study.  Staff at [facility] reported no breathing difficulties. Patient started refusing meals. Thus added to my schedule today.   Comes here today. Here alone initially but then joined by her daughter -  Jennifer Nielsen - this daughter has power of attorney. In a wheelchair. She is not able to stand. No weight able to be obtained. She is reportedly weighed every day at the facility. Her medicine list that was sent with her today continues to list demedex, zaroxolyn and lasix. Have had to clarify her medicine verbally over the phone - just on demedex apparently. Patient is resistant to my care. Tells me she is not short of breath - even though she . Denies chest pain. No swelling. Last reported weight was 194.8 which was Monday. Daughter is quite concerned about her panting breathing. Also has had prior UTIs. Daughter wishes for active treatment to continue. Patient is  telling me she will not wear a mask again and that "nothing is wrong".   Current Outpatient Prescriptions  Medication Sig Dispense Refill  . acetaminophen (TYLENOL) 500 MG tablet Take 500 mg by mouth 3 (three) times daily.       . budesonide-formoterol (SYMBICORT) 160-4.5 MCG/ACT inhaler Inhale 2 puffs into the lungs 2 (two) times daily.      . Calcium Carbonate-Vitamin D (CALTRATE 600+D PO) Take 1 tablet by mouth every morning. For calcium supplement      . digoxin (LANOXIN) 0.125 MG tablet Take 1 tablet (0.125 mg total) by mouth every other day.      . digoxin (LANOXIN) 0.25 MG tablet Take 0.25 mg by mouth every other day. Alternate between  0.125 and 0.250      . diltiazem (CARDIZEM CD) 120 MG 24 hr capsule Take 120 mg by mouth daily. For A-Fib      . divalproex (DEPAKOTE SPRINKLE) 125 MG capsule Take 125 mg by mouth 2 (two) times daily. For mood stabilzer      . Docusate Sodium (DSS) 100 MG CAPS Take 100 mg by mouth daily. For constipation      . furosemide (LASIX) 40 MG tablet Take 1 tablet (40 mg total) by mouth 2 (two) times daily.  90 tablet  3  . metolazone (ZAROXOLYN) 2.5 MG tablet Take 1 tablet (2.5 mg total) by mouth daily.  90 tablet  4  .  OLANZapine (ZYPREXA) 2.5 MG tablet Take 2.5 mg by mouth at bedtime.       Vladimir Faster Glycol-Propyl Glycol (SYSTANE OP) Place 1 drop into both eyes 2 (two) times daily. For dry eyes      . potassium chloride SA (K-DUR,KLOR-CON) 20 MEQ tablet Take 20 mEq by mouth daily. Dissolvable K-Dur      . Rivaroxaban (XARELTO) 20 MG TABS tablet Take 20 mg by mouth daily. For DVT      . torsemide (DEMADEX) 20 MG tablet Take 1 tablet (20 mg total) by mouth 2 (two) times daily.  180 tablet  4   No current facility-administered medications for this visit.    Allergies  Allergen Reactions  . Chocolate Itching    unknown  . Ciprofloxacin Itching    unknown  . Coffee Bean Extract [Coffea Arabica]     unknown  . Coffee Flavor Itching  . Gentamycin  [Gentamicin] Itching    unknown  . Imodium [Loperamide]     unknown  . Penicillins Itching    unknown  . Sulfa Antibiotics Itching    unknown  . Tea Itching    unknown    Past Medical History  Diagnosis Date  . Arrhythmia   . Arthritis   . Atrial fibrillation     Remains stable (As of 05/09/13)  . H/O blood clots   . Hyperlipemia   . Dementia     Remains stable & continues to function adequately in the current living environment with supervision. Has had little changes in behavior (AS OF 05/09/13)  . GERD (gastroesophageal reflux disease)     Stable (As of 05/09/13)  . Osteoarthritis     Denies pain (As of 05/09/13)  . CHF (congestive heart failure)     chronic diastolic  . COPD (chronic obstructive pulmonary disease)     w/exacerbation    Past Surgical History  Procedure Laterality Date  . Appendectomy    . Gallbladder surgery    . Hip surgery      Right-Metal plate   . Leg surgery      Left leg    History  Smoking status  . Former Smoker  Smokeless tobacco  . Never Used    History  Alcohol Use No    Family History  Problem Relation Age of Onset  . Colon cancer Neg Hx   . Heart disease Father   . Breast cancer Daughter     Review of Systems: The review of systems is per the HPI.  All other systems were reviewed and are negative.  Physical Exam: BP 140/70  Pulse 60  Ht 5\' 7"  (1.702 m)  SpO2 99% Patient is inappropriate in some of her answers - almost a little paranoid - confused at times. Rambling. She is panting with a respiratory rate in the 40's. At times you can hear her breathing from the doorway. She is emotional - crying one second and then smiling the next. She has tried to push me away but then will wink at me. She is in a wheelchair. Skin is warm and dry. Color is normal.  HEENT is unremarkable but her mucous membranes are quite dry - looks partched. Normocephalic/atraumatic. PERRL. Sclera are nonicteric. Neck is supple. No masses. No JVD.  Lungs are clear. Cardiac exam shows an irregular rhythm. Her rate is ok. Abdomen is obese but soft. Extremities are without edema. Gait not tested. No gross neurologic deficits noted but she seems confused to me.  Wt  Readings from Last 3 Encounters:  02/05/14 208 lb (94.348 kg)  01/29/14 208 lb 6.4 oz (94.53 kg)  12/26/13 205 lb 12.8 oz (93.35 kg)    LABORATORY DATA/PROCEDURES:         Lab Results  Component Value Date   WBC 9.2 09/25/2013   HGB 12.8 09/25/2013   HCT 41.0 09/25/2013   PLT 210 09/25/2013   GLUCOSE 89 09/25/2013   ALT 26 09/20/2013   AST 31 09/20/2013   NA 137 09/25/2013   K 4.3 09/25/2013   CL 89* 09/25/2013   CREATININE 0.49* 09/25/2013   BUN 25* 09/25/2013   CO2 40* 09/25/2013   TSH 2.44 10/29/2011   INR 1.60* 06/25/2013    BNP (last 3 results)  Recent Labs  06/25/13 1337 09/19/13 1258  PROBNP 3381.0* 4084.0*   Echo Study Conclusions from January 2015  - Left ventricle: The cavity size was normal. Wall thickness was increased in a pattern of mild LVH. Systolic function was normal. The estimated ejection fraction was in the range of 55% to 60%. Wall motion was normal; there were no regional wall motion abnormalities. - Aortic valve: Trivial regurgitation. - Left atrium: The atrium was mildly to moderately dilated. - Right ventricle: The cavity size was mildly dilated. - Right atrium: The atrium was mildly dilated. - Pulmonary arteries: PA peak pressure: 25mm Hg (S).    Assessment / Plan: 1. Tachypnea - daughter notes that this is clearly a change in her mentation - she saw her mother last night and reports that she was not acting this way whatsoever. Has had prior respiratory failure with chronically elevated CO2s - I suspect this is the issue. I have already sent stat labs - will send on to the ER for further evaluation.   2. Chronic atrial fib - on Xarelto  3. Presumed diastolic HF -  Looks like her weight is down. We have gotten a new medicine list -  only on the demedex 20 mg BID.   4. Hypercapnia - in the setting of COPD - I suspect this is the issue.   Daughter wishes for treatment. Will send on to the ER - she does have labs in process. EMS called and Triage alerted.   Patient is agreeable to this plan and will call if any problems develop in the interim.   Burtis Junes, RN, [place] [address] [place], [place]  [phone]

## 2014-02-22 NOTE — ED Provider Notes (Signed)
Medical screening examination/treatment/procedure(s) were conducted as a shared visit with non-physician practitioner(s) and myself.  I personally evaluated the patient during the encounter.   EKG Interpretation None     89yF sent for evaluation of dyspnea. Pt has no acute complaints though. Clinically was concern for HF and given dose lasix. Subsequent ABG with significant metabolic alkalosis. Diuretic recently changed to demadex and recent vomiting likely culprit. Pt more than likely chronic co2 retainer with compensatory bicarb retention.  Discussed with nephrology. Would hold diuretic for a few days.   Virgel Manifold, MD 03/04/14 1431

## 2014-02-22 NOTE — ED Notes (Signed)
Pt finished meal tray, requesting to go home; pt and family informed of delay in transportation. [facility] called again for transportation to [facility].

## 2014-02-22 NOTE — ED Provider Notes (Signed)
CSN: [id number]     Arrival date & time 02/22/14  1330 History   First MD Initiated Contact with Patient 02/22/14 1336     Chief Complaint  Patient presents with  . Shortness of Breath     (Consider location/radiation/quality/duration/timing/severity/associated sxs/prior Treatment) Patient is a 80 y.o. female presenting with shortness of breath.  Shortness of Breath  Jennifer Nielsen Is an 80 year old female with a past medical history of COPD, CHF, chronic rate-controlled atrial fibrillation who presents to the emergency department from her primary care doctor's office via ambulance for severe shortness of breath. According to her daughter who gives the history she complained to her physician about frequent urination 2 weeks ago was taken off her Lasix and switch to another medication. She began having vomiting the new medication and that was discontinued however she was not started back on her Lasix. Review of her home meds lists torsemide. The patient denies cough or symptoms of respiratory infection, shortness of breath has been worsening progressively. She denies any swelling in her ankles since worse than normal. He daughter states that she has had elevated CO2 levels at her SNF, [facility].   Past Medical History  Diagnosis Date  . Arrhythmia   . Arthritis   . Atrial fibrillation     Remains stable (As of 05/09/13)  . H/O blood clots   . Hyperlipemia   . Dementia     Remains stable & continues to function adequately in the current living environment with supervision. Has had little changes in behavior (AS OF 05/09/13)  . GERD (gastroesophageal reflux disease)     Stable (As of 05/09/13)  . Osteoarthritis     Denies pain (As of 05/09/13)  . CHF (congestive heart failure)     chronic diastolic  . COPD (chronic obstructive pulmonary disease)     w/exacerbation   Past Surgical History  Procedure Laterality Date  . Appendectomy    . Gallbladder surgery    . Hip surgery     Right-Metal plate   . Leg surgery      Left leg   Family History  Problem Relation Age of Onset  . Colon cancer Neg Hx   . Heart disease Father   . Breast cancer Daughter    History  Substance Use Topics  . Smoking status: Former Research scientist (life sciences)  . Smokeless tobacco: Never Used  . Alcohol Use: No   OB History   Grav Para Term Preterm Abortions TAB SAB Ect Mult Living                 Review of Systems  Respiratory: Positive for shortness of breath.     Ten systems reviewed and are negative for acute change, except as noted in the HPI.    Allergies  Chocolate; Ciprofloxacin; Coffee bean extract; Coffee flavor; Gentamycin; Imodium; Penicillins; Sulfa antibiotics; and Tea  Home Medications   Prior to Admission medications   Medication Sig Start Date End Date Taking? Authorizing Provider  acetaminophen (TYLENOL) 500 MG tablet Take 500 mg by mouth 3 (three) times daily.    Yes Historical Provider, MD  budesonide-formoterol (SYMBICORT) 160-4.5 MCG/ACT inhaler Inhale 2 puffs into the lungs 2 (two) times daily.   Yes Historical Provider, MD  Calcium Carbonate-Vitamin D (CALTRATE 600+D PO) Take 1 tablet by mouth every morning. For calcium supplement   Yes Historical Provider, MD  digoxin (LANOXIN) 0.125 MG tablet Take 1 tablet (0.125 mg total) by mouth every other day. 09/25/13  Yes Preetha  Broadus John, MD  digoxin (LANOXIN) 0.25 MG tablet Take 0.25 mg by mouth every other day. Alternate between  0.125 and 0.250   Yes Historical Provider, MD  diltiazem (CARDIZEM CD) 120 MG 24 hr capsule Take 120 mg by mouth daily. For A-Fib 07/02/13  Yes Bobby Rumpf York, PA-C  divalproex (DEPAKOTE SPRINKLE) 125 MG capsule Take 125 mg by mouth 2 (two) times daily. For mood stabilzer   Yes Historical Provider, MD  Docusate Sodium (DSS) 100 MG CAPS Take 100 mg by mouth daily. For constipation 07/02/13  Yes Marianne L York, PA-C  OLANZapine (ZYPREXA) 2.5 MG tablet Take 2.5 mg by mouth at bedtime.    Yes Historical  Provider, MD  Polyethyl Glycol-Propyl Glycol (SYSTANE OP) Place 1 drop into both eyes 2 (two) times daily. For dry eyes   Yes Historical Provider, MD  potassium chloride SA (K-DUR,KLOR-CON) 20 MEQ tablet Take 20 mEq by mouth 2 (two) times daily.   Yes Historical Provider, MD  Rivaroxaban (XARELTO) 20 MG TABS tablet Take 20 mg by mouth daily. For DVT   Yes Historical Provider, MD  torsemide (DEMADEX) 20 MG tablet Take 1 tablet (20 mg total) by mouth 2 (two) times daily. 02/05/14  Yes Dorothy Spark, MD  furosemide (LASIX) 40 MG tablet Take 40 mg by mouth 2 (two) times daily.    Historical Provider, MD   BP 122/44  Pulse 59  Temp(Src) 98 F (36.7 C) (Oral)  Resp 30  SpO2 98% Physical Exam  Nursing note and vitals reviewed. Constitutional: She is oriented to person, place, and time. She appears well-developed and well-nourished. She appears distressed.  Tachypnea to 42, moving little air  HENT:  Head: Normocephalic and atraumatic.  Eyes: Conjunctivae are normal. No scleral icterus.  Neck: Normal range of motion.  Cardiovascular: Normal rate, regular rhythm and normal heart sounds.  Exam reveals no gallop and no friction rub.   No murmur heard. Pulmonary/Chest: No respiratory distress. She has rales.  Coarse crackles from the bases to the mid scapular region  Abdominal: Soft. Bowel sounds are normal. She exhibits no distension and no mass. There is no tenderness. There is no guarding.  Neurological: She is alert and oriented to person, place, and time.  Skin: Skin is warm and dry. She is not diaphoretic.    ED Course  Procedures (including critical care time) Labs Review Labs Reviewed  CBC WITH DIFFERENTIAL - Abnormal; Notable for the following:    RDW 16.5 (*)    All other components within normal limits  COMPREHENSIVE METABOLIC PANEL - Abnormal; Notable for the following:    Sodium 136 (*)    Chloride 87 (*)    CO2 33 (*)    BUN 29 (*)    Albumin 3.2 (*)    GFR calc non Af  Amer 73 (*)    GFR calc Af Amer 85 (*)    Anion gap 16 (*)    All other components within normal limits  PRO B NATRIURETIC PEPTIDE - Abnormal; Notable for the following:    Pro B Natriuretic peptide (BNP) 2244.0 (*)    All other components within normal limits  DIGOXIN LEVEL - Abnormal; Notable for the following:    Digoxin Level 2.2 (*)    All other components within normal limits  I-STAT ARTERIAL BLOOD GAS, ED - Abnormal; Notable for the following:    pH, Arterial 7.632 (*)    pO2, Arterial 73.0 (*)    Bicarbonate 42.0 (*)  Acid-Base Excess 19.0 (*)    All other components within normal limits  URINALYSIS, ROUTINE W REFLEX MICROSCOPIC  I-STAT TROPOININ, ED    Imaging Review No results found.   EKG Interpretation None      Date: 02/22/2014  Rate: 54  Rhythm: atrial fibrillation  QRS Axis: normal  Intervals: normal  ST/T Wave abnormalities: normal  Conduction Disutrbances:none  Narrative Interpretation:   Old EKG Reviewed: unchanged   MDM   Final diagnoses:  SOB (shortness of breath)  Metabolic alkalosis  Elevated digoxin level    2:40 PM BP 122/44  Pulse 59  Temp(Src) 98 F (36.7 C) (Oral)  Resp 30  SpO2 98% Patient with Apparent CHF exacerbation. CXR is read by the radiologist as negative, however clinically I feel the patient clinically appears  To have CHF exacerbation.have poor effort and crackles. i have begin IV lasix, patient given foley cath.  3:24 PM Patient seen in shared visit with Dr Wilson Singer. She has not put out much urine and continues to breathe irregularly. The patient's daughter states that she always seems to breathe this way and that it has improved since she got to the ED. She does appear to have elevated dig levels. She seems to have a metabolic alkalosis which may be from her vomiting. Patient has history of chronically elevated Bicarb, however she normally retains CO2 and has a normal CO2 today. Differential includes H+ loss from  vomiting versus bicarb retention secaondary to diuretic use. Unfortunately, I gave patient IV lasix earlier as clinically she appeared to have a CHF exacerbation and this may worsen her alkalosis. I will call for admission of the patient. She may need IV acetazolamide. Her troponin is pending.   4:16 PM Negative troponin.  Her EKG is unchanged. I spoke with Dr. Clover Mealy who states that she is safe to be discharged with her alkalosis. She should be instructed to withhold her lasix and torsemide for the next 2 days. We will also have her withhold her digoxin Patient should follow with her doctor int he next 48-72 hours.Margarita Mail, PA-C 02/22/14 1622

## 2014-02-22 NOTE — Discharge Instructions (Signed)
Please do not take your, digoxin, lasix or torsemide for the next 2 days. After that you may begin taking it again.   Shortness of Breath Shortness of breath means you have trouble breathing. It could also mean that you have a medical problem. You should get immediate medical care for shortness of breath. CAUSES   Not enough oxygen in the air such as with high altitudes or a smoke-filled room.  Certain lung diseases, infections, or problems.  Heart disease or conditions, such as angina or heart failure.  Low red blood cells (anemia).  Poor physical fitness, which can cause shortness of breath when you exercise.  Chest or back injuries or stiffness.  Being overweight.  Smoking.  Anxiety, which can make you feel like you are not getting enough air. DIAGNOSIS  Serious medical problems can often be found during your physical exam. Tests may also be done to determine why you are having shortness of breath. Tests may include:  Chest X-rays.  Lung function tests.  Blood tests.  An electrocardiogram (ECG).  An ambulatory electrocardiogram. An ambulatory ECG records your heartbeat patterns over a 24-hour period.  Exercise testing.  A transthoracic echocardiogram (TTE). During echocardiography, sound waves are used to evaluate how blood flows through your heart.  A transesophageal echocardiogram (TEE).  Imaging scans. Your health care provider may not be able to find a cause for your shortness of breath after your exam. In this case, it is important to have a follow-up exam with your health care provider as directed.  TREATMENT  Treatment for shortness of breath depends on the cause of your symptoms and can vary greatly. HOME CARE INSTRUCTIONS   Do not smoke. Smoking is a common cause of shortness of breath. If you smoke, ask for help to quit.  Avoid being around chemicals or things that may bother your breathing, such as paint fumes and dust.  Rest as needed. Slowly  resume your usual activities.  If medicines were prescribed, take them as directed for the full length of time directed. This includes oxygen and any inhaled medicines.  Keep all follow-up appointments as directed by your health care provider. SEEK MEDICAL CARE IF:   Your condition does not improve in the time expected.  You have a hard time doing your normal activities even with rest.  You have any new symptoms. SEEK IMMEDIATE MEDICAL CARE IF:   Your shortness of breath gets worse.  You feel light-headed, faint, or develop a cough not controlled with medicines.  You start coughing up blood.  You have pain with breathing.  You have chest pain or pain in your arms, shoulders, or abdomen.  You have a fever.  You are unable to walk up stairs or exercise the way you normally do. MAKE SURE YOU:  Understand these instructions.  Will watch your condition.  Will get help right away if you are not doing well or get worse. Document Released: 02/23/2001 Document Revised: 06/05/2013 Document Reviewed: 08/16/2011 [facility] Patient Information 2015 [place], [place]. This information is not intended to replace advice given to you by your health care provider. Make sure you discuss any questions you have with your health care provider.  Pursed Lip Breathing Some long-term respiratory conditions like COPD and asthma can make it hard to release all the air in your lungs when you breathe out (exhale). This can result in oxygen-poor air building up in your lungs (air trapping) and make it hard to fill your lungs with fresh air during  each breath. This can also cause you to feel short of breath.  Pursed lip breathing can help to relieve some of this shortness of breath. By keeping your airways open for a longer amount of time during your breath out, you can empty more air out of your lungs. This will make more space for fresh air during the next breath in (inhalation).  HOW TO PERFORM PURSED LIP  BREATHING 1. Close your mouth. 2. Breathe in through your nose, taking a normal-sized breath. The rate of breathing in may be your normal rate, or you can try breathing in with a slow count to two or three if you feel you are not getting enough air. 3. Pucker your lips as if you were going to whistle. 4. Gently tighten your stomach muscles to help push the air out. 5. Breathe out slowly through your pursed lips. You should breathe out at least twice as long as you breathe in. 6.  Be sure you breathe out all of the air, but do not force the air out. GET HELP IF: Your shortness of breath gets worse. GET HELP RIGHT AWAY IF:  You are very short of breath. Document Released: 03/09/2008 Document Revised: 10/15/2013 Document Reviewed: 01/10/2013 [facility] Patient Information 2015 [place], [place]. This information is not intended to replace advice given to you by your health care provider. Make sure you discuss any questions you have with your health care provider.

## 2014-02-22 NOTE — ED Notes (Signed)
PA, Margarita Mail at bedside at this time.

## 2014-02-22 NOTE — ED Notes (Addendum)
Per EMS pt was sent from physicians office because of shortness of breath. Pt has hx of atrial fibrillation, COPD, CHF. Per EMS pulse is irregular at 55. Pt sats at 99% on room air per EMS, also hypercapneic. Per EMS pt is in atrial fib at this time. Jearld Shines, RN at bedside at this time putting pt in gown and hooking to monitor. Pt is tachypnic at this time.

## 2014-02-22 NOTE — ED Notes (Signed)
Pt resting comfortably. Discharge instructions reviewed with patient and pt daughter. Foley and IV discontinued.

## 2014-02-22 NOTE — ED Notes (Signed)
Pt informed of delay with [facility]; [facility] reports "being backed up"

## 2014-02-25 DIAGNOSIS — Z79899 Other long term (current) drug therapy: Secondary | ICD-10-CM | POA: Diagnosis not present

## 2014-02-25 DIAGNOSIS — D649 Anemia, unspecified: Secondary | ICD-10-CM | POA: Diagnosis not present

## 2014-02-25 LAB — BASIC METABOLIC PANEL
BUN: 20 mg/dL (ref 4–21)
CREATININE: 0.6 mg/dL (ref 0.5–1.1)
GLUCOSE: 83 mg/dL
POTASSIUM: 3.3 mmol/L — AB (ref 3.4–5.3)
SODIUM: 134 mmol/L — AB (ref 137–147)

## 2014-02-26 ENCOUNTER — Non-Acute Institutional Stay (SKILLED_NURSING_FACILITY): Payer: Medicare Other | Admitting: Internal Medicine

## 2014-02-26 DIAGNOSIS — F0281 Dementia in other diseases classified elsewhere with behavioral disturbance: Secondary | ICD-10-CM | POA: Diagnosis not present

## 2014-02-26 DIAGNOSIS — J439 Emphysema, unspecified: Secondary | ICD-10-CM

## 2014-02-26 DIAGNOSIS — I5032 Chronic diastolic (congestive) heart failure: Secondary | ICD-10-CM | POA: Diagnosis not present

## 2014-02-26 DIAGNOSIS — F0390 Unspecified dementia without behavioral disturbance: Secondary | ICD-10-CM | POA: Diagnosis not present

## 2014-02-26 DIAGNOSIS — J438 Other emphysema: Secondary | ICD-10-CM | POA: Diagnosis not present

## 2014-02-26 DIAGNOSIS — F02818 Dementia in other diseases classified elsewhere, unspecified severity, with other behavioral disturbance: Secondary | ICD-10-CM | POA: Diagnosis not present

## 2014-02-26 DIAGNOSIS — I4891 Unspecified atrial fibrillation: Secondary | ICD-10-CM | POA: Diagnosis not present

## 2014-02-26 DIAGNOSIS — I482 Chronic atrial fibrillation, unspecified: Secondary | ICD-10-CM

## 2014-02-26 DIAGNOSIS — I509 Heart failure, unspecified: Secondary | ICD-10-CM

## 2014-02-26 DIAGNOSIS — F22 Delusional disorders: Secondary | ICD-10-CM | POA: Diagnosis not present

## 2014-02-27 NOTE — ED Provider Notes (Signed)
Medical screening examination/treatment/procedure(s) were conducted as a shared visit with non-physician practitioner(s) and myself.  I personally evaluated the patient during the encounter.   EKG Interpretation   Date/Time:  Friday February 22 2014 14:11:43 EDT Ventricular Rate:  54 PR Interval:    QRS Duration: 87 QT Interval:  393 QTC Calculation: 372 R Axis:   52 Text Interpretation:  Atrial fibrillation Low voltage, precordial leads  Minimal ST depression, anterolateral leads ED PHYSICIAN INTERPRETATION  AVAILABLE IN [facility] Confirmed by TEST, Record ([id number]) on  02/24/2014 1:12:06 PM       Virgel Manifold, MD 02/27/14 1156

## 2014-02-28 DIAGNOSIS — J439 Emphysema, unspecified: Secondary | ICD-10-CM | POA: Insufficient documentation

## 2014-02-28 NOTE — Progress Notes (Signed)
HISTORY & PHYSICAL  DATE: 02/26/2014   FACILITY: [facility]  LEVEL OF CARE: SNF (31)  ALLERGIES:  Allergies  Allergen Reactions  . Chocolate Itching  . Ciprofloxacin Itching  . Coffee Bean Extract [Coffea Arabica] Itching  . Coffee Flavor Itching  . Gentamycin [Gentamicin] Itching  . Imodium [Loperamide] Other (See Comments)    unknown  . Penicillins Itching  . Sulfa Antibiotics Itching  . Tea Itching    CHIEF COMPLAINT:  Manage atrial fibrillation, COPD and CHF  HISTORY OF PRESENT ILLNESS: 80 year old Caucasian female was hospitalized secondary to acute respiratory failure. After a hospitalization patient is readmitted back to the facility for long-term care and management.  ATRIAL FIBRILLATION: the patients atrial fibrillation remains stable.  The patient denies DOE, tachycardia, orthopnea, transient neurological sx, pedal edema, palpitations, & PNDs.  No complications noted from the medications currently being used.  COPD: the COPD remains stable.  Pt denies sob, cough, wheezing or declining exercise tolerance.  No complications from the medications presently being used.  CHF:The patient does not relate significant weight changes, denies sob, DOE, orthopnea, PNDs, pedal edema, palpitations or chest pain.  CHF remains stable.  No complications form the medications being used.  PAST MEDICAL HISTORY :  Past Medical History  Diagnosis Date  . Arrhythmia   . Arthritis   . Atrial fibrillation     Remains stable (As of 05/09/13)  . H/O blood clots   . Hyperlipemia   . Dementia     Remains stable & continues to function adequately in the current living environment with supervision. Has had little changes in behavior (AS OF 05/09/13)  . GERD (gastroesophageal reflux disease)     Stable (As of 05/09/13)  . Osteoarthritis     Denies pain (As of 05/09/13)  . CHF (congestive heart failure)     chronic diastolic  . COPD (chronic obstructive  pulmonary disease)     w/exacerbation    PAST SURGICAL HISTORY: Past Surgical History  Procedure Laterality Date  . Appendectomy    . Gallbladder surgery    . Hip surgery      Right-Metal plate   . Leg surgery      Left leg    SOCIAL HISTORY:  reports that she has quit smoking. She has never used smokeless tobacco. She reports that she does not drink alcohol or use illicit drugs.  FAMILY HISTORY:  Family History  Problem Relation Age of Onset  . Colon cancer Neg Hx   . Heart disease Father   . Breast cancer Daughter     CURRENT MEDICATIONS: Reviewed per MAR/see medication list  REVIEW OF SYSTEMS:  See HPI otherwise 14 point ROS is negative.  PHYSICAL EXAMINATION  VS:  See VS section  GENERAL: no acute distress, moderately obese body habitus EYES: conjunctivae normal, sclerae normal, normal eye lids MOUTH/THROAT: lips without lesions,no lesions in the mouth,tongue is without lesions,uvula elevates in midline NECK: supple, trachea midline, no neck masses, no thyroid tenderness, no thyromegaly LYMPHATICS: no LAN in the neck, no supraclavicular LAN RESPIRATORY: breathing is even & unlabored, BS CTAB CARDIAC: Heart rate is irregularly irregular, no murmur,no extra heart sounds, no edema GI:  ABDOMEN: abdomen soft, normal BS, no masses, no tenderness  LIVER/SPLEEN: no hepatomegaly, no splenomegaly MUSCULOSKELETAL: HEAD: normal to inspection  EXTREMITIES: LEFT UPPER EXTREMITY: Moderate range of motion, decreased strength & tone RIGHT UPPER EXTREMITY: Moderate range of motion, decreased strength & tone LEFT  LOWER EXTREMITY:  range of motion unable to perform, minimal strength & tone RIGHT LOWER EXTREMITY: range of motion unable to perform , minimal strength & tone PSYCHIATRIC: the patient is alert & oriented to person, affect & behavior appropriate  LABS/RADIOLOGY:  Labs reviewed: Basic Metabolic Panel:  Recent Labs  09/25/13 0410 02/22/14 1214 02/22/14 1417  NA  137 136 136*  K 4.3 4.1 4.7  CL 89* 84* 87*  CO2 40* 38* 33*  GLUCOSE 89 117* 84  BUN 25* 28* 29*  CREATININE 0.49* 0.80 0.78  CALCIUM 8.4 9.6 9.6   Liver Function Tests:  Recent Labs  09/19/13 1258 09/20/13 0318 02/22/14 1417  AST 40* 31 34  ALT 26 26 22   ALKPHOS 85 80 82  BILITOT 0.5 0.4 0.6  PROT 7.3 7.2 7.3  ALBUMIN 3.1* 3.0* 3.2*   CBC:  Recent Labs  06/25/13 1135  09/19/13 1258  09/25/13 0410 02/22/14 1214 02/22/14 1417  WBC 8.0  < > 6.2  < > 9.2 8.4 8.3  NEUTROABS 5.9  --  3.7  --   --   --  5.2  HGB 13.7  < > 13.2  < > 12.8 14.3 14.9  HCT 41.3  < > 41.8  < > 41.0 43.9 45.2  MCV 90.2  < > 99.8  < > 97.6 94.2 95.8  PLT 357  < > 224  < > 210 308 288  < > = values in this interval not displayed.  Cardiac Enzymes:  Recent Labs  06/25/13 2115 06/26/13 0235 06/26/13 0840  TROPONINI <0.30 <0.30 <0.30   CBG:  Recent Labs  09/20/13 0822  GLUCAP 138*    PORTABLE CHEST - 1 VIEW   COMPARISON:  09/20/2013   FINDINGS: The heart size and mediastinal contours are within normal limits. Both lungs are clear. The visualized skeletal structures are unremarkable.   IMPRESSION: No active disease Transthoracic Echocardiography  Patient:    Scarleth, Brame MR #:       [id number] Study Date: 06/26/2013 Gender:     F Age:        41 Height:     160cm Weight:     102.7kg BSA:        2.81m^2 Pt. Status: Room:       D31C    ATTENDING    Debbe Odea  SONOGRAPHER  Tresa Res, RDCS  ADMITTING    Pearla Dubonnet, Robb Matar  PERFORMING   Chmg, Inpatient cc:  ------------------------------------------------------------ LV EF: 55% -   60%  ------------------------------------------------------------ Indications:      Dyspnea 786.09.  ------------------------------------------------------------ History:   PMH:   Atrial fibrillation.  Congestive heart failure.  Risk factors:  Former tobacco  use.  ------------------------------------------------------------ Study Conclusions  - Left ventricle: The cavity size was normal. Wall thickness   was increased in a pattern of mild LVH. Systolic function   was normal. The estimated ejection fraction was in the   range of 55% to 60%. Wall motion was normal; there were no   regional wall motion abnormalities. - Aortic valve: Trivial regurgitation. - Left atrium: The atrium was mildly to moderately dilated. - Right ventricle: The cavity size was mildly dilated. - Right atrium: The atrium was mildly dilated. - Pulmonary arteries: PA peak pressure: 40mm Hg (S). Impressions:  - No prior study for comparison. Transthoracic echocardiography.  M-mode, complete 2D, spectral Doppler, and color Doppler.  Height:  Height: 160cm. Height: 63in.  Weight:  Weight: 102.7kg. Weight: 226lb.  Body mass index:  BMI: 40.1kg/m^2.  Body surface area:    BSA: 2.75m^2.  Blood pressure:     102/65.  Patient status:  Inpatient.  Location:  ICU/CCU  ------------------------------------------------------------  ------------------------------------------------------------ Left ventricle:  The cavity size was normal. Wall thickness was increased in a pattern of mild LVH. Systolic function was normal. The estimated ejection fraction was in the range of 55% to 60%. Wall motion was normal; there were no regional wall motion abnormalities. The study was not technically sufficient to allow evaluation of LV diastolic dysfunction due to atrial fibrillation.  ------------------------------------------------------------ Aortic valve:   Trileaflet; normal thickness leaflets. Mobility was not restricted.  Doppler:  Transvalvular velocity was within the normal range. There was no stenosis.  Trivial regurgitation.  ------------------------------------------------------------ Aorta:  Aortic root: The aortic root was normal in  size.  ------------------------------------------------------------ Mitral valve:   Structurally normal valve.   Mobility was not restricted.  Doppler:  Transvalvular velocity was within the normal range. There was no evidence for stenosis.  No regurgitation.    Peak gradient: 79mm Hg (D).  ------------------------------------------------------------ Left atrium:  The atrium was mildly to moderately dilated.   ------------------------------------------------------------ Right ventricle:  The cavity size was mildly dilated. Systolic function was normal.  ------------------------------------------------------------ Pulmonic valve:    Doppler:  Transvalvular velocity was within the normal range. There was no evidence for stenosis.   ------------------------------------------------------------ Tricuspid valve:   Structurally normal valve.    Doppler: Transvalvular velocity was within the normal range.  Trivial regurgitation.  ------------------------------------------------------------ Pulmonary artery:   Systolic pressure was within the normal range.  ------------------------------------------------------------ Right atrium:  The atrium was mildly dilated.  ------------------------------------------------------------ Pericardium:  There was no pericardial effusion.  ------------------------------------------------------------  2D measurements        Normal  Doppler               Normal Left ventricle                 measurements LVID ED,   43.6 mm     43-52   Main pulmonary chord,                         artery PLAX                           Pressure, S    41 mm  =30 LVID ES,   32.5 mm     23-38                     Hg chord,                         Left ventricle PLAX                           Ea, lat      10.6 cm/ ------- FS, chord,   25 %      >29     ann, tiss         s PLAX                           DP LVPW, ED   11.8 mm     ------  E/Ea, lat    9.43      -------  IVS/LVPW   0.94        <1.3    ann, tiss ratio, ED                      DP Ventricular septum             Ea, med      8.34 cm/ ------- IVS, ED    11.1 mm     ------  ann, tiss         s Aorta                          DP Root diam,   28 mm     ------  E/Ea, med   11.99     ------- ED                             ann, tiss Left atrium                    DP AP dim       46 mm     ------  Mitral valve AP dim      2.1 cm/m^2 <2.2    Peak E vel    100 cm/ ------- index                                            s Right ventricle                Deceleratio   189 ms  150-230 RVID ED,   47.8 mm     19-38   n time PLAX                           Peak            4 mm  -------                                gradient, D       Hg                                Tricuspid valve                                Regurg peak   280 cm/ -------                                vel               s                                Peak RV-RA     31 mm  -------                                gradient, S       Hg  Max regurg    280 cm/ -------                                vel               s                                Systemic veins                                Estimated      10 mm  -------                                CVP               Hg                                Right ventricle                                Pressure, S    41 mm  <30                                                  Hg   ASSESSMENT/PLAN:  Atrial fibrillation-rate controlled COPD-compensated CHF-compensated Dementia-stable Hypokalemia-continue supplementation Constipation-well controlled  I have reviewed patient's medical records received at admission/from hospitalization.  CPT CODE: 53976  Lisanne Ponce Y Adriel Desrosier, [place] [phone]

## 2014-03-08 DIAGNOSIS — F0281 Dementia in other diseases classified elsewhere with behavioral disturbance: Secondary | ICD-10-CM | POA: Diagnosis not present

## 2014-03-08 DIAGNOSIS — F329 Major depressive disorder, single episode, unspecified: Secondary | ICD-10-CM | POA: Diagnosis not present

## 2014-03-08 DIAGNOSIS — F3289 Other specified depressive episodes: Secondary | ICD-10-CM | POA: Diagnosis not present

## 2014-03-08 DIAGNOSIS — F02818 Dementia in other diseases classified elsewhere, unspecified severity, with other behavioral disturbance: Secondary | ICD-10-CM | POA: Diagnosis not present

## 2014-03-08 DIAGNOSIS — F22 Delusional disorders: Secondary | ICD-10-CM | POA: Diagnosis not present

## 2014-03-12 DIAGNOSIS — F02818 Dementia in other diseases classified elsewhere, unspecified severity, with other behavioral disturbance: Secondary | ICD-10-CM | POA: Diagnosis not present

## 2014-03-12 DIAGNOSIS — F22 Delusional disorders: Secondary | ICD-10-CM | POA: Diagnosis not present

## 2014-03-12 DIAGNOSIS — F0281 Dementia in other diseases classified elsewhere with behavioral disturbance: Secondary | ICD-10-CM | POA: Diagnosis not present

## 2014-03-29 DIAGNOSIS — F328 Other depressive episodes: Secondary | ICD-10-CM | POA: Diagnosis not present

## 2014-03-29 DIAGNOSIS — F0281 Dementia in other diseases classified elsewhere with behavioral disturbance: Secondary | ICD-10-CM | POA: Diagnosis not present

## 2014-03-29 DIAGNOSIS — F22 Delusional disorders: Secondary | ICD-10-CM | POA: Diagnosis not present

## 2014-04-08 ENCOUNTER — Encounter: Payer: Self-pay | Admitting: Cardiology

## 2014-04-08 ENCOUNTER — Ambulatory Visit (INDEPENDENT_AMBULATORY_CARE_PROVIDER_SITE_OTHER): Payer: Medicare Other | Admitting: Cardiology

## 2014-04-08 VITALS — BP 142/72 | HR 76

## 2014-04-08 DIAGNOSIS — J441 Chronic obstructive pulmonary disease with (acute) exacerbation: Secondary | ICD-10-CM

## 2014-04-08 DIAGNOSIS — I482 Chronic atrial fibrillation, unspecified: Secondary | ICD-10-CM

## 2014-04-08 DIAGNOSIS — E785 Hyperlipidemia, unspecified: Secondary | ICD-10-CM | POA: Diagnosis not present

## 2014-04-08 DIAGNOSIS — R0602 Shortness of breath: Secondary | ICD-10-CM

## 2014-04-08 DIAGNOSIS — I5032 Chronic diastolic (congestive) heart failure: Secondary | ICD-10-CM

## 2014-04-08 DIAGNOSIS — I5033 Acute on chronic diastolic (congestive) heart failure: Secondary | ICD-10-CM

## 2014-04-08 NOTE — Patient Instructions (Signed)
Your physician recommends that you continue on your current medications as directed. Please refer to the Current Medication list given to you today.   Your physician recommends that you return for lab work in: IN 2 MONTHS AT [facility] (CMP AND BNP)   Your physician recommends that you schedule a follow-up appointment in: 2 [facility] AS WELL

## 2014-04-08 NOTE — Progress Notes (Signed)
Patient ID: Jennifer Nielsen, female   DOB: [birth year], 80 y.o.   MRN: [id number]   Jennifer Nielsen Date of Birth: 06-Sep-1924 Medical Record #[id number]  History of Present Illness: [name] is seen today for a work in visit. Seen for Dr. Meda Coffee. She is an 80 year old female who resides at [facility]. She has OA, chronic AF, dementia, GERD, COPD and chronic diastolic HF. She is on chronic anticoagulation. Broke her hip 2 years ago - and is wheelchair bound. Last echo from January of 2015 with EF of 55 to 60%.   Discharged from [facility] in April with respiratory failure. Noted to be a DNR.   She was seen here in August - wanted to discuss changing her Coumadin to a NOAC. Switched to [facility]. Felt to be volume overloaded as well - lasix started but then had ?allergic rash and was switched to Torsemide and metolazone. Also given Advair for her COPD.  Numerous phone calls since her last visit - has had worsening CO2 levels - 38 to 45 then to 55. She was noted to have had this back in April during her admission at that time with respiratory failure. Oxygen sats variable. Patient has refused oxygen. Does not use CPAP. No history of sleep study.  Staff at [facility] reported no breathing difficulties. Patient started refusing meals. Thus added to my schedule today.   Comes here today. Here alone initially but then joined by her daughter -  [name] - this daughter has power of attorney. In a wheelchair. She is not able to stand. No weight able to be obtained. She is reportedly weighed every day at the facility. Her medicine list that was sent with her today continues to list demedex, zaroxolyn and lasix. Have had to clarify her medicine verbally over the phone - just on demedex apparently. Patient is resistant to my care. Tells me she is not short of breath - even though she . Denies chest pain. No swelling. Last reported weight was 194.8 which was Monday. Daughter is quite concerned about her panting breathing. Also has had  prior UTIs. Daughter wishes for active treatment to continue. Patient is telling me she will not wear a mask again and that "nothing is wrong".   04/08/2014 - the patient feels well, she is at her baseline weight - 205 lbs. She has persistent LE edema that doesn't hurt. Her mentation is significantly improved. No dizziness or syncope.   Current Outpatient Prescriptions  Medication Sig Dispense Refill  . acetaminophen (TYLENOL) 500 MG tablet Take 500 mg by mouth 3 (three) times daily.       . budesonide-formoterol (SYMBICORT) 160-4.5 MCG/ACT inhaler Inhale 2 puffs into the lungs 2 (two) times daily.      . Calcium Carbonate-Vitamin D (CALTRATE 600+D PO) Take 1 tablet by mouth every morning. For calcium supplement      . digoxin (LANOXIN) 0.125 MG tablet Take 1 tablet (0.125 mg total) by mouth every other day.      . digoxin (LANOXIN) 0.25 MG tablet Take 0.25 mg by mouth every other day. Alternate between  0.125 and 0.250      . diltiazem (CARDIZEM CD) 120 MG 24 hr capsule Take 120 mg by mouth daily. For A-Fib      . divalproex (DEPAKOTE SPRINKLE) 125 MG capsule Take 125 mg by mouth 2 (two) times daily. For mood stabilzer      . Docusate Sodium (DSS) 100 MG CAPS Take 100 mg by mouth daily. For constipation      .  furosemide (LASIX) 40 MG tablet Take 40 mg by mouth 2 (two) times daily.      . metolazone (ZAROXOLYN) 2.5 MG tablet Take 2.5 mg by mouth daily.      Marland Kitchen OLANZapine (ZYPREXA) 2.5 MG tablet Take 2.5 mg by mouth at bedtime.       Vladimir Faster Glycol-Propyl Glycol (SYSTANE OP) Place 1 drop into both eyes 2 (two) times daily. For dry eyes      . Rivaroxaban (XARELTO) 20 MG TABS tablet Take 20 mg by mouth daily. For DVT      . torsemide (DEMADEX) 20 MG tablet Take 1 tablet (20 mg total) by mouth 2 (two) times daily.  180 tablet  4   No current facility-administered medications for this visit.    Allergies  Allergen Reactions  . Chocolate Itching  . Ciprofloxacin Itching  . Coffee Bean  Extract [Coffea Arabica] Itching  . Coffee Flavor Itching  . Gentamycin [Gentamicin] Itching  . Imodium [Loperamide] Other (See Comments)    unknown  . Penicillins Itching  . Sulfa Antibiotics Itching  . Tea Itching    Past Medical History  Diagnosis Date  . Arrhythmia   . Arthritis   . Atrial fibrillation     Remains stable (As of 05/09/13)  . H/O blood clots   . Hyperlipemia   . Dementia     Remains stable & continues to function adequately in the current living environment with supervision. Has had little changes in behavior (AS OF 05/09/13)  . GERD (gastroesophageal reflux disease)     Stable (As of 05/09/13)  . Osteoarthritis     Denies pain (As of 05/09/13)  . CHF (congestive heart failure)     chronic diastolic  . COPD (chronic obstructive pulmonary disease)     w/exacerbation    Past Surgical History  Procedure Laterality Date  . Appendectomy    . Gallbladder surgery    . Hip surgery      Right-Metal plate   . Leg surgery      Left leg    History  Smoking status  . Former Smoker  Smokeless tobacco  . Never Used    History  Alcohol Use No    Family History  Problem Relation Age of Onset  . Colon cancer Neg Hx   . Heart disease Father   . Breast cancer Daughter     Review of Systems: The review of systems is per the HPI.  All other systems were reviewed and are negative.  Physical Exam: BP 142/72  Pulse 76 General: Pleasant, NAD  Psych: Normal affect.  Neuro: Alert and oriented X 3. Moves all extremities spontaneously.  HEENT: Normal  Neck: Supple without bruits or JVD.  Lungs: Resp regular and unlabored, crackles at bases.  Heart: RRR no s3, s4, or murmurs.  Abdomen: Soft, non-tender, non-distended, BS + x 4.  Extremities: No clubbing, cyanosis, mild pitting edema up to the mid thighs B/L. DP/PT/Radials 2+ and equal bilaterally.   Wt Readings from Last 3 Encounters:  02/05/14 208 lb (94.348 kg)  01/29/14 208 lb 6.4 oz (94.53 kg)    12/26/13 205 lb 12.8 oz (93.35 kg)    LABORATORY DATA/PROCEDURES:         Lab Results  Component Value Date   WBC 8.3 02/22/2014   HGB 14.9 02/22/2014   HCT 45.2 02/22/2014   PLT 288 02/22/2014   GLUCOSE 84 02/22/2014   ALT 22 02/22/2014   AST 34 02/22/2014  NA 136* 02/22/2014   K 4.7 02/22/2014   CL 87* 02/22/2014   CREATININE 0.78 02/22/2014   BUN 29* 02/22/2014   CO2 33* 02/22/2014   TSH 2.44 10/29/2011   INR 1.60* 06/25/2013    BNP (last 3 results)  Recent Labs  06/25/13 1337 09/19/13 1258 02/22/14 1417  PROBNP 3381.0* 4084.0* 2244.0*   Echo Study Conclusions from January 2015  - Left ventricle: The cavity size was normal. Wall thickness was increased in a pattern of mild LVH. Systolic function was normal. The estimated ejection fraction was in the range of 55% to 60%. Wall motion was normal; there were no regional wall motion abnormalities. - Aortic valve: Trivial regurgitation. - Left atrium: The atrium was mildly to moderately dilated. - Right ventricle: The cavity size was mildly dilated. - Right atrium: The atrium was mildly dilated. - Pulmonary arteries: PA peak pressure: 15mm Hg (S).    Assessment / Plan:  1. Tachypnea - improved with improved hypercapnea- after redusing diuretic dose.   2. Chronic atrial fib - on Xarelto, no bleeding.  3. Diastolic HF - at her baseline 205 lbs today with persistant LE edema, advised to elevated her legs and use compression stockings.  Continue the same diuretic management.  4. Hypercapnia - in the setting of COPD - I suspect this is the issue.   Follow up in 2 months with CMP and BNP.  Dorothy Spark 04/08/2014

## 2014-04-09 DIAGNOSIS — F22 Delusional disorders: Secondary | ICD-10-CM | POA: Diagnosis not present

## 2014-04-09 DIAGNOSIS — F0281 Dementia in other diseases classified elsewhere with behavioral disturbance: Secondary | ICD-10-CM | POA: Diagnosis not present

## 2014-04-18 ENCOUNTER — Encounter: Payer: Self-pay | Admitting: Adult Health

## 2014-04-18 ENCOUNTER — Non-Acute Institutional Stay (SKILLED_NURSING_FACILITY): Payer: Medicare Other | Admitting: Adult Health

## 2014-04-18 DIAGNOSIS — F29 Unspecified psychosis not due to a substance or known physiological condition: Secondary | ICD-10-CM

## 2014-04-18 DIAGNOSIS — I482 Chronic atrial fibrillation, unspecified: Secondary | ICD-10-CM

## 2014-04-18 DIAGNOSIS — I5032 Chronic diastolic (congestive) heart failure: Secondary | ICD-10-CM | POA: Diagnosis not present

## 2014-04-18 DIAGNOSIS — F0390 Unspecified dementia without behavioral disturbance: Secondary | ICD-10-CM | POA: Diagnosis not present

## 2014-04-18 NOTE — Progress Notes (Signed)
Patient ID: Jennifer Nielsen, female   DOB: May 31, 1925, 80 y.o.   MRN: [id number]   Place of Service: [facility]  Allergies  Allergen Reactions  . Chocolate Itching  . Ciprofloxacin Itching  . Coffee Bean Extract [Coffea Arabica] Itching  . Coffee Flavor Itching  . Gentamycin [Gentamicin] Itching  . Imodium [Loperamide] Other (See Comments)    unknown  . Penicillins Itching  . Sulfa Antibiotics Itching  . Tea Itching    Code Status: DNR  Goals of Care: Comfort and Quality of Life/Long term care  Chief Complaint  Patient presents with  . Medical Management of Chronic Issues    depression, dementia, afib, psychosis    HPI 80 y.o. female with PMH of senile dementia, chronic afib, chronic diastolic HF, psychosis, among many others is being seen for a routine visit for management of chronic medical issues. No complaints verbalized from patient. Weight stable. No change in behaviors or functional status. No fall or skin issues reported. No concerns from staff.   Review of Systems Constitutional: Negative for fever or chills HENT: Negative for cough, congestion, and sore throat Eyes: Negative for eye pain and visual disturbance  Cardiovascular: Negative for chest pain and leg swelling Respiratory: Negative cough, shortness of breath, and wheezing.  Gastrointestinal: Negative for nausea and vomiting. Negative for abdominal pain, diarrhea and constipation.  Musculoskeletal: Negative for back pain Neurological: Negative for dizziness and headache.  Skin: Negative for rash and wound.   Psychiatric: Negative for depression   Past Medical History  Diagnosis Date  . Arrhythmia   . Arthritis   . Atrial fibrillation     Remains stable (As of 05/09/13)  . H/O blood clots   . Hyperlipemia   . Dementia     Remains stable & continues to function adequately in the current living environment with supervision. Has had little changes in behavior (AS OF 05/09/13)  . GERD  (gastroesophageal reflux disease)     Stable (As of 05/09/13)  . Osteoarthritis     Denies pain (As of 05/09/13)  . CHF (congestive heart failure)     chronic diastolic  . COPD (chronic obstructive pulmonary disease)     w/exacerbation    Past Surgical History  Procedure Laterality Date  . Appendectomy    . Gallbladder surgery    . Hip surgery      Right-Metal plate   . Leg surgery      Left leg    History   Social History  . Marital Status: Single    Spouse Name: N/A    Number of Children: 2  . Years of Education: N/A   Occupational History  . Retired     Pharmacist, hospital   Social History Main Topics  . Smoking status: Former Research scientist (life sciences)  . Smokeless tobacco: Never Used  . Alcohol Use: No  . Drug Use: No  . Sexual Activity: No   Other Topics Concern  . Not on file   Social History Narrative   No caffeine       Medication List       This list is accurate as of: 04/18/14  3:56 PM.  Always use your most recent med list.               budesonide-formoterol 160-4.5 MCG/ACT inhaler  Commonly known as:  SYMBICORT  Inhale 2 puffs into the lungs 2 (two) times daily.     CALTRATE 600+D PO  Take 1 tablet by mouth every morning.  For calcium supplement     digoxin 0.25 MG tablet  Commonly known as:  LANOXIN  Take 0.25 mg by mouth every other day. Alternate between  0.125 and 0.250     digoxin 0.125 MG tablet  Commonly known as:  LANOXIN  Take 0.125 mg by mouth every other day.     diltiazem 120 MG 24 hr capsule  Commonly known as:  CARDIZEM CD  Take 120 mg by mouth daily. For A-Fib     divalproex 125 MG capsule  Commonly known as:  DEPAKOTE SPRINKLE  Take 125 mg by mouth 2 (two) times daily. For mood stabilzer     DSS 100 MG Caps  Take 100 mg by mouth daily. For constipation     furosemide 40 MG tablet  Commonly known as:  LASIX  Take 40 mg by mouth 2 (two) times daily.     metolazone 2.5 MG tablet  Commonly known as:  ZAROXOLYN  Take 2.5 mg by mouth  daily.     OLANZapine 2.5 MG tablet  Commonly known as:  ZYPREXA  Take 2.5 mg by mouth at bedtime.     potassium chloride SA 20 MEQ tablet  Commonly known as:  K-DUR,KLOR-CON  Take 20 mEq by mouth 2 (two) times daily.     SYSTANE OP  Place 1 drop into both eyes 2 (two) times daily. For dry eyes     torsemide 20 MG tablet  Commonly known as:  DEMADEX  Take 1 tablet (20 mg total) by mouth 2 (two) times daily.     TYLENOL 500 MG tablet  Generic drug:  acetaminophen  Take 500 mg by mouth 3 (three) times daily.     XARELTO 20 MG Tabs tablet  Generic drug:  rivaroxaban  Take 20 mg by mouth daily. For DVT        Physical Exam Filed Vitals:   04/18/14 1543  BP: 116/64  Pulse: 80  Temp: 97.5 F (36.4 C)  Resp: 20  Body mass index is 31.38 kg/(m^2).  Constitutional: WDWN elderly female in no acute distress. Conversant. Pleasant.  HEENT: Normocephalic and atraumatic. PERRL. EOM intact. No icterus. Oral mucosa moist. Posterior pharynx clear of any exudate or lesions.  Neck: Supple and nontender. No lymphadenopathy, masses, or thyromegaly. No JVD or carotid bruits. Cardiac: Normal S1, S2. Regular rate irregular rhythm without appreciable murmurs, rubs, or gallops. Distal pulses intact. 1+ pitting dependent edema Lungs: No respiratory distress. Breath sounds clear bilaterally without rales, rhonchi, or wheezes. Abdomen: Audible bowel sounds in all quadrants. Obese, soft, nontender, nondistended. No palpable mass.  Musculoskeletal: Able to move all extremities. No joint erythema or tenderness. Skin: Warm and dry. No rash noted. No erythema.  Neurological: Alert and oriented to person.  Psychiatric: Appropriate mood and affect.   Labs Reviewed CBC Latest Ref Rng 02/22/2014 02/22/2014 09/25/2013  WBC 4.0 - 10.5 K/uL 8.3 8.4 9.2  Hemoglobin 12.0 - 15.0 g/dL 14.9 14.3 12.8  Hematocrit 36.0 - 46.0 % 45.2 43.9 41.0  Platelets 150 - 400 K/uL 288 308 210   CMP     Component Value  Date/Time   NA 134* 02/25/2014   NA 136* 02/22/2014 1417   K 3.3* 02/25/2014   CL 87* 02/22/2014 1417   CO2 33* 02/22/2014 1417   GLUCOSE 84 02/22/2014 1417   BUN 20 02/25/2014   BUN 29* 02/22/2014 1417   CREATININE 0.6 02/25/2014   CREATININE 0.78 02/22/2014 1417   CREATININE 0.80 02/22/2014 1214  CALCIUM 9.6 02/22/2014 1417   PROT 7.3 02/22/2014 1417   ALBUMIN 3.2* 02/22/2014 1417   AST 34 02/22/2014 1417   ALT 22 02/22/2014 1417   ALKPHOS 82 02/22/2014 1417   BILITOT 0.6 02/22/2014 1417   GFRNONAA 73* 02/22/2014 1417   GFRAA 85* 02/22/2014 1417    Lab Results  Component Value Date   DIGOXIN 2.2* 02/22/2014   02/25/14  Digoxin 1.3  Lab Results  Component Value Date   VALPROATE 57.2 09/19/2013   11/26/13 Valproate 46.3   Assessment & Plan 1. Chronic diastolic congestive heart failure Stable. Appears euvolemic on exam. Continue diuretic therapy with lasix 40mg  bid, torsemide 20mg  bid, and metolazone 2.5mg  daily. Continue 73mEq of potassium BID for hypokalemia. Continue monitoring weight on Mon and Wed. Will check CMP. Continue to monitor  2. Chronic atrial fibrillation Stable. Rate controlled. Continue xarelto 20mg  daily, digoxin 0.25mg  and 0.125mg  alternating every other day, and diltizem 120mg  daily. Continue to monitor.   3. Psychosis, unspecified psychosis type Stable. No change in behaviors reported. Continue zyprexa 5mg  daily and Depakote 125mg  BID. Last depakote level subtherapeutic on 11/26/13. Will recheck depakote level and adjust dosage accordingly. Continue to monitor for change in behaviors.  4. Senile dementia, uncomplicated Persists. Continue to monitor for change in behaviors. Continue fall risk and pressure ulcer prophylaxis.    Family/Staff Communication Plan of care discuss with nursing staff. Nursing staff verbalize understanding and agree with plan of care. No additional questions or concerns reported.    Arthur Holms, MSN, AGNP-C [address], [place] [address] [phone] [8am-5pm] After hours: [phone]  I have personally reviewed this note and agreed with plan of care.  Monina C. Concrete - NP [employer] [phone]

## 2014-04-19 DIAGNOSIS — Z79899 Other long term (current) drug therapy: Secondary | ICD-10-CM | POA: Diagnosis not present

## 2014-04-19 DIAGNOSIS — D649 Anemia, unspecified: Secondary | ICD-10-CM | POA: Diagnosis not present

## 2014-05-02 DIAGNOSIS — M25551 Pain in right hip: Secondary | ICD-10-CM | POA: Diagnosis not present

## 2014-05-15 DIAGNOSIS — F0390 Unspecified dementia without behavioral disturbance: Secondary | ICD-10-CM | POA: Diagnosis not present

## 2014-05-15 DIAGNOSIS — F39 Unspecified mood [affective] disorder: Secondary | ICD-10-CM | POA: Diagnosis not present

## 2014-05-15 DIAGNOSIS — F29 Unspecified psychosis not due to a substance or known physiological condition: Secondary | ICD-10-CM | POA: Diagnosis not present

## 2014-05-28 DIAGNOSIS — F0390 Unspecified dementia without behavioral disturbance: Secondary | ICD-10-CM | POA: Diagnosis not present

## 2014-05-28 DIAGNOSIS — F39 Unspecified mood [affective] disorder: Secondary | ICD-10-CM | POA: Diagnosis not present

## 2014-05-28 DIAGNOSIS — F29 Unspecified psychosis not due to a substance or known physiological condition: Secondary | ICD-10-CM | POA: Diagnosis not present

## 2014-06-10 DIAGNOSIS — H6122 Impacted cerumen, left ear: Secondary | ICD-10-CM | POA: Diagnosis not present

## 2014-06-17 ENCOUNTER — Other Ambulatory Visit (INDEPENDENT_AMBULATORY_CARE_PROVIDER_SITE_OTHER): Payer: Medicare Other | Admitting: *Deleted

## 2014-06-17 ENCOUNTER — Ambulatory Visit (INDEPENDENT_AMBULATORY_CARE_PROVIDER_SITE_OTHER): Payer: Medicare Other | Admitting: Cardiology

## 2014-06-17 ENCOUNTER — Encounter: Payer: Self-pay | Admitting: Cardiology

## 2014-06-17 VITALS — BP 108/62 | HR 59

## 2014-06-17 DIAGNOSIS — I5032 Chronic diastolic (congestive) heart failure: Secondary | ICD-10-CM | POA: Diagnosis not present

## 2014-06-17 DIAGNOSIS — E876 Hypokalemia: Secondary | ICD-10-CM

## 2014-06-17 DIAGNOSIS — I509 Heart failure, unspecified: Secondary | ICD-10-CM | POA: Diagnosis not present

## 2014-06-17 DIAGNOSIS — E871 Hypo-osmolality and hyponatremia: Secondary | ICD-10-CM

## 2014-06-17 DIAGNOSIS — J441 Chronic obstructive pulmonary disease with (acute) exacerbation: Secondary | ICD-10-CM | POA: Diagnosis not present

## 2014-06-17 DIAGNOSIS — I482 Chronic atrial fibrillation, unspecified: Secondary | ICD-10-CM

## 2014-06-17 DIAGNOSIS — R0602 Shortness of breath: Secondary | ICD-10-CM

## 2014-06-17 DIAGNOSIS — J9602 Acute respiratory failure with hypercapnia: Secondary | ICD-10-CM | POA: Diagnosis not present

## 2014-06-17 DIAGNOSIS — I5033 Acute on chronic diastolic (congestive) heart failure: Secondary | ICD-10-CM

## 2014-06-17 DIAGNOSIS — E785 Hyperlipidemia, unspecified: Secondary | ICD-10-CM | POA: Diagnosis not present

## 2014-06-17 LAB — COMPREHENSIVE METABOLIC PANEL
ALT: 15 U/L (ref 0–35)
AST: 17 U/L (ref 0–37)
Albumin: 3.6 g/dL (ref 3.5–5.2)
Alkaline Phosphatase: 68 U/L (ref 39–117)
BUN: 25 mg/dL — ABNORMAL HIGH (ref 6–23)
CO2: 40 mEq/L — ABNORMAL HIGH (ref 19–32)
Calcium: 9.2 mg/dL (ref 8.4–10.5)
Chloride: 94 mEq/L — ABNORMAL LOW (ref 96–112)
Creatinine, Ser: 0.8 mg/dL (ref 0.4–1.2)
GFR: 71.95 mL/min (ref >60.00)
Glucose, Bld: 82 mg/dL (ref 70–99)
Potassium: 4.3 mEq/L (ref 3.5–5.1)
Sodium: 143 mEq/L (ref 135–145)
Total Bilirubin: 0.5 mg/dL (ref 0.2–1.2)
Total Protein: 7.1 g/dL (ref 6.0–8.3)

## 2014-06-17 LAB — BRAIN NATRIURETIC PEPTIDE: Pro B Natriuretic peptide (BNP): 170 pg/mL — ABNORMAL HIGH (ref 0.0–100.0)

## 2014-06-17 NOTE — Patient Instructions (Signed)
Your physician recommends that you continue on your current medications as directed. Please refer to the Current Medication list given to you today.    Your physician recommends that you return for lab work in: TODAY (BNP AND BMET)

## 2014-06-17 NOTE — Progress Notes (Signed)
Patient ID: Jennifer Nielsen, female   DOB: February 15, 1925, 81 y.o.   MRN: [id number]     C[place]elite Violet Date of Birth: 02/21/1925 Medical Record #[id number]  History of Present Illness: [name] is seen today for a work in visit. Seen for Dr. Meda Coffee. She is an 81 year old female who resides at [facility]. She has OA, chronic AF, dementia, GERD, COPD and chronic diastolic HF. She is on chronic anticoagulation. Broke her hip 2 years ago - and is wheelchair bound. Last echo from January of 2015 with EF of 55 to 60%.   Discharged from [facility] in April with respiratory failure. Noted to be a DNR.   She was seen here in August - wanted to discuss changing her Coumadin to a NOAC. Switched to [facility]. Felt to be volume overloaded as well - lasix started but then had ?allergic rash and was switched to Torsemide and metolazone. Also given Advair for her COPD.  Numerous phone calls since her last visit - has had worsening CO2 levels - 38 to 45 then to 55. She was noted to have had this back in April during her admission at that time with respiratory failure. Oxygen sats variable. Patient has refused oxygen. Does not use CPAP. No history of sleep study.  Staff at [facility] reported no breathing difficulties. Patient started refusing meals. Thus added to my schedule today.   Comes here today. Here alone initially but then joined by her daughter -  [name] - this daughter has power of attorney. In a wheelchair. She is not able to stand. No weight able to be obtained. She is reportedly weighed every day at the facility. Her medicine list that was sent with her today continues to list demedex, zaroxolyn and lasix. Have had to clarify her medicine verbally over the phone - just on demedex apparently. Patient is resistant to my care. Tells me she is not short of breath - even though she . Denies chest pain. No swelling. Last reported weight was 194.8 which was Monday. Daughter is quite concerned about her panting breathing. Also has  had prior UTIs. Daughter wishes for active treatment to continue. Patient is telling me she will not wear a mask again and that "nothing is wrong".   04/08/2014 - the patient feels well, she is at her baseline weight - 205 lbs. She has persistent LE edema that doesn't hurt. Her mentation is significantly improved. No dizziness or syncope.  06/17/2014 - the patient returns after 6 weeks, she continues taking chlor dose of diuretics and wears compression stockings almost all the time. Her lower extremity edema has improved. She doesn't elevate her legs all the time. She denies any orthopnea, paroxysmal nocturnal dyspnea. She remains in wheelchair and doesn't feel short of breath at rest.   Current Outpatient Prescriptions  Medication Sig Dispense Refill  . acetaminophen (TYLENOL) 500 MG tablet Take 500 mg by mouth 3 (three) times daily.     . budesonide-formoterol (SYMBICORT) 160-4.5 MCG/ACT inhaler Inhale 2 puffs into the lungs 2 (two) times daily.    . Calcium Carbonate-Vitamin D (CALTRATE 600+D PO) Take 1 tablet by mouth every morning. For calcium supplement    . digoxin (LANOXIN) 0.125 MG tablet Take 0.125 mg by mouth every other day.    . digoxin (LANOXIN) 0.25 MG tablet Take 0.25 mg by mouth every other day. Alternate between  0.125 and 0.250    . diltiazem (CARDIZEM CD) 120 MG 24 hr capsule Take 120 mg by mouth daily.  For A-Fib    . divalproex (DEPAKOTE SPRINKLE) 125 MG capsule Take 125 mg by mouth 2 (two) times daily. For mood stabilzer    . Docusate Sodium (DSS) 100 MG CAPS Take 100 mg by mouth daily. For constipation    . furosemide (LASIX) 40 MG tablet Take 40 mg by mouth 2 (two) times daily.    . metolazone (ZAROXOLYN) 2.5 MG tablet Take 2.5 mg by mouth daily.    Marland Kitchen OLANZapine (ZYPREXA) 5 MG tablet Take 5 mg by mouth at bedtime.    Vladimir Faster Glycol-Propyl Glycol (SYSTANE OP) Place 1 drop into both eyes 2 (two) times daily. For dry eyes    . potassium chloride SA (K-DUR,KLOR-CON) 20  MEQ tablet Take 20 mEq by mouth 2 (two) times daily.    . Rivaroxaban (XARELTO) 20 MG TABS tablet Take 20 mg by mouth daily. For DVT    . torsemide (DEMADEX) 20 MG tablet Take 1 tablet (20 mg total) by mouth 2 (two) times daily. 180 tablet 4   No current facility-administered medications for this visit.    Allergies  Allergen Reactions  . Chocolate Itching  . Ciprofloxacin Itching  . Coffee Bean Extract [Coffea Arabica] Itching  . Coffee Flavor Itching  . Gentamycin [Gentamicin] Itching  . Imodium [Loperamide] Other (See Comments)    unknown  . Penicillins Itching  . Sulfa Antibiotics Itching  . Tea Itching    Past Medical History  Diagnosis Date  . Arrhythmia   . Arthritis   . Atrial fibrillation     Remains stable (As of 05/09/13)  . H/O blood clots   . Hyperlipemia   . Dementia     Remains stable & continues to function adequately in the current living environment with supervision. Has had little changes in behavior (AS OF 05/09/13)  . GERD (gastroesophageal reflux disease)     Stable (As of 05/09/13)  . Osteoarthritis     Denies pain (As of 05/09/13)  . CHF (congestive heart failure)     chronic diastolic  . COPD (chronic obstructive pulmonary disease)     w/exacerbation    Past Surgical History  Procedure Laterality Date  . Appendectomy    . Gallbladder surgery    . Hip surgery      Right-Metal plate   . Leg surgery      Left leg    History  Smoking status  . Former Smoker  Smokeless tobacco  . Never Used    History  Alcohol Use No    Family History  Problem Relation Age of Onset  . Colon cancer Neg Hx   . Heart disease Father   . Breast cancer Daughter     Review of Systems: The review of systems is per the HPI.  All other systems were reviewed and are negative.  Physical Exam: BP 108/62 mmHg  Pulse 59  Ht   Wt  General: Pleasant, NAD  Psych: Normal affect.  Neuro: Alert and oriented X 3. Moves all extremities spontaneously.    HEENT: Normal  Neck: Supple without bruits or JVD.  Lungs: Resp regular and unlabored, crackles at bases.  Heart: RRR no s3, s4, or murmurs.  Abdomen: Soft, non-tender, non-distended, BS + x 4.  Extremities: No clubbing, cyanosis, mild pitting edema up to the mid thighs B/L. DP/PT/Radials 2+ and equal bilaterally.   Wt Readings from Last 3 Encounters:  04/18/14 200 lb 6.4 oz (90.901 kg)  02/05/14 208 lb (94.348 kg)  01/29/14 208  lb 6.4 oz (94.53 kg)    LABORATORY DATA/PROCEDURES:         Lab Results  Component Value Date   WBC 8.3 02/22/2014   HGB 14.9 02/22/2014   HCT 45.2 02/22/2014   PLT 288 02/22/2014   GLUCOSE 84 02/22/2014   ALT 22 02/22/2014   AST 34 02/22/2014   NA 134* 02/25/2014   K 3.3* 02/25/2014   CL 87* 02/22/2014   CREATININE 0.6 02/25/2014   BUN 20 02/25/2014   CO2 33* 02/22/2014   TSH 2.44 10/29/2011   INR 1.60* 06/25/2013    BNP (last 3 results)  Recent Labs  06/25/13 1337 09/19/13 1258 02/22/14 1417  PROBNP 3381.0* 4084.0* 2244.0*   Echo Study Conclusions from January 2015  - Left ventricle: The cavity size was normal. Wall thickness was increased in a pattern of mild LVH. Systolic function was normal. The estimated ejection fraction was in the range of 55% to 60%. Wall motion was normal; there were no regional wall motion abnormalities. - Aortic valve: Trivial regurgitation. - Left atrium: The atrium was mildly to moderately dilated. - Right ventricle: The cavity size was mildly dilated. - Right atrium: The atrium was mildly dilated. - Pulmonary arteries: PA peak pressure: 68mm Hg (S).    Assessment / Plan:  1. Tachypnea - improved with improved hypercapnea- after redusing diuretic dose.   2. Chronic atrial fib - on Xarelto, no bleeding, rate controlled  3. Diastolic HF - at her baseline 205 lbs today with persistant LE edema, advised to elevated her legs and use compression stockings. Continue the same diuretic  management.  4. Hypercapnia - in the setting of COPD - I suspect this is the issue.   Follow up in BMP and BNP today. Follow-up in 6 months.  Dorothy Spark 06/17/2014

## 2014-06-18 ENCOUNTER — Encounter: Payer: Self-pay | Admitting: Adult Health

## 2014-06-18 ENCOUNTER — Non-Acute Institutional Stay (SKILLED_NURSING_FACILITY): Payer: Medicare Other | Admitting: Adult Health

## 2014-06-18 DIAGNOSIS — I5032 Chronic diastolic (congestive) heart failure: Secondary | ICD-10-CM | POA: Diagnosis not present

## 2014-06-18 DIAGNOSIS — J449 Chronic obstructive pulmonary disease, unspecified: Secondary | ICD-10-CM

## 2014-06-18 DIAGNOSIS — E876 Hypokalemia: Secondary | ICD-10-CM

## 2014-06-18 DIAGNOSIS — F29 Unspecified psychosis not due to a substance or known physiological condition: Secondary | ICD-10-CM | POA: Diagnosis not present

## 2014-06-18 DIAGNOSIS — K5900 Constipation, unspecified: Secondary | ICD-10-CM

## 2014-06-18 DIAGNOSIS — F0390 Unspecified dementia without behavioral disturbance: Secondary | ICD-10-CM | POA: Diagnosis not present

## 2014-06-18 DIAGNOSIS — I482 Chronic atrial fibrillation, unspecified: Secondary | ICD-10-CM

## 2014-06-18 NOTE — Progress Notes (Signed)
Patient ID: Jennifer Nielsen, female   DOB: [birth year], 81 y.o.   MRN: [id number]   06/18/2014  Facility:  Nursing Home Location:  [place] Room Number: 5465-6 LEVEL OF CARE:  SNF (31)  Routine Visit  Chief Complaint  Patient presents with  . Medical Management of Chronic Issues    Atrial fibrillation, dementia, psychoses, COPD, CHF, constipation and hypokalemia    HISTORY OF PRESENT ILLNESS:  This is an 81 year old female who is a long-term resident at [facility]. She is alert to self. Patient is pleasant. She denies having shortness of breath nor palpitations. Has significant medical history of COPD, CHF and atrial fibrillation. Last month she was started on Zyprexa due to psychoses.  PAST MEDICAL HISTORY:  Past Medical History  Diagnosis Date  . Arrhythmia   . Arthritis   . Atrial fibrillation     Remains stable (As of 05/09/13)  . H/O blood clots   . Hyperlipemia   . Dementia     Remains stable & continues to function adequately in the current living environment with supervision. Has had little changes in behavior (AS OF 05/09/13)  . GERD (gastroesophageal reflux disease)     Stable (As of 05/09/13)  . Osteoarthritis     Denies pain (As of 05/09/13)  . CHF (congestive heart failure)     chronic diastolic  . COPD (chronic obstructive pulmonary disease)     w/exacerbation    CURRENT MEDICATIONS: Reviewed per MAR/see medication list  Allergies  Allergen Reactions  . Chocolate Itching  . Ciprofloxacin Itching  . Coffee Bean Extract [Coffea Arabica] Itching  . Coffee Flavor Itching  . Gentamycin [Gentamicin] Itching  . Imodium [Loperamide] Other (See Comments)    unknown  . Penicillins Itching  . Sulfa Antibiotics Itching  . Tea Itching     REVIEW OF SYSTEMS:  GENERAL: no fatigue, no weight changes, no fever, chills or weakness RESPIRATORY: no cough, SOB, DOE, wheezing, hemoptysis CARDIAC: no chest pain, or palpitations GI: no  abdominal pain, diarrhea, constipation, heart burn, nausea or vomiting  PHYSICAL EXAMINATION  GENERAL: no acute distress, normal body habitus EYES: conjunctivae normal, sclerae normal, normal eye lids NECK: supple, trachea midline, no neck masses, no thyroid tenderness, no thyromegaly LYMPHATICS: no LAN in the neck, no supraclavicular LAN RESPIRATORY: breathing is even & unlabored, BS CTAB CARDIAC: Irregularly irregular, no murmur,no extra heart sounds, BLE edema 2+ GI: abdomen soft, normal BS, no masses, no tenderness, no hepatomegaly, no splenomegaly EXTREMITIES: Uses wheelchair; able to move all 4 extremities PSYCHIATRIC: the patient is alert & oriented to person, affect & behavior appropriate  LABS/RADIOLOGY: Labs reviewed: Basic Metabolic Panel:  Recent Labs  02/22/14 1214 02/22/14 1417 02/25/14 06/17/14 1050  NA 136 136* 134* 143  K 4.1 4.7 3.3* 4.3  CL 84* 87*  --  94*  CO2 38* 33*  --  40*  GLUCOSE 117* 84  --  82  BUN 28* 29* 20 25*  CREATININE 0.80 0.78 0.6 0.8  CALCIUM 9.6 9.6  --  9.2   Liver Function Tests:  Recent Labs  09/20/13 0318 02/22/14 1417 06/17/14 1050  AST 31 34 17  ALT 26 22 15   ALKPHOS 80 82 68  BILITOT 0.4 0.6 0.5  PROT 7.2 7.3 7.1  ALBUMIN 3.0* 3.2* 3.6   CBC:  Recent Labs  06/25/13 1135  09/19/13 1258  09/25/13 0410 02/22/14 1214 02/22/14 1417  WBC 8.0  < > 6.2  < >  9.2 8.4 8.3  NEUTROABS 5.9  --  3.7  --   --   --  5.2  HGB 13.7  < > 13.2  < > 12.8 14.3 14.9  HCT 41.3  < > 41.8  < > 41.0 43.9 45.2  MCV 90.2  < > 99.8  < > 97.6 94.2 95.8  PLT 357  < > 224  < > 210 308 288  < > = values in this interval not displayed.  Cardiac Enzymes:  Recent Labs  06/25/13 2115 06/26/13 0235 06/26/13 0840  TROPONINI <0.30 <0.30 <0.30   CBG:  Recent Labs  09/20/13 0822  GLUCAP 138*    ASSESSMENT/PLAN:  Chronic atrial fibrillation - rate controlled; continue digoxin 0.125 mg 1 tab by mouth every other day and digoxin 0.25 mg  by mouth every other day Dementia - stable Psychosis - mood is stable; continue Zyprexa 5 mg by mouth daily at bedtime and Depakote 125 mg by mouth twice a day Chronic diastolic CHF - continue Zaroxolyn 2.5 mg by mouth daily, Demadex 20 mg by mouth twice a day and Lasix 40 mg by mouth twice a day Constipation - continue Colace 100 mg by mouth daily Hypokalemia - continue K-Dur 20 M EQ ER 1 tab by mouth twice a day  Goals of care:   long-term care    Labs/test ordered:  BMP in 1 week      [facility], [place] [phone]

## 2014-06-25 DIAGNOSIS — D649 Anemia, unspecified: Secondary | ICD-10-CM | POA: Diagnosis not present

## 2014-07-29 DIAGNOSIS — I509 Heart failure, unspecified: Secondary | ICD-10-CM | POA: Diagnosis not present

## 2014-07-29 DIAGNOSIS — R0602 Shortness of breath: Secondary | ICD-10-CM | POA: Diagnosis not present

## 2014-08-06 DIAGNOSIS — N390 Urinary tract infection, site not specified: Secondary | ICD-10-CM | POA: Diagnosis not present

## 2014-08-11 ENCOUNTER — Inpatient Hospital Stay (HOSPITAL_COMMUNITY)
Admission: EM | Admit: 2014-08-11 | Discharge: 2014-08-20 | DRG: 871 | Disposition: A | Discharge status: Skilled Nursing Facility | Payer: Medicare Other | Attending: Internal Medicine | Admitting: Internal Medicine

## 2014-08-11 ENCOUNTER — Inpatient Hospital Stay (HOSPITAL_COMMUNITY): Payer: Medicare Other

## 2014-08-11 ENCOUNTER — Encounter (HOSPITAL_COMMUNITY): Payer: Self-pay

## 2014-08-11 ENCOUNTER — Emergency Department (HOSPITAL_COMMUNITY): Payer: Medicare Other

## 2014-08-11 DIAGNOSIS — A419 Sepsis, unspecified organism: Secondary | ICD-10-CM | POA: Diagnosis not present

## 2014-08-11 DIAGNOSIS — J811 Chronic pulmonary edema: Secondary | ICD-10-CM | POA: Diagnosis not present

## 2014-08-11 DIAGNOSIS — Z66 Do not resuscitate: Secondary | ICD-10-CM | POA: Diagnosis present

## 2014-08-11 DIAGNOSIS — Z7401 Bed confinement status: Secondary | ICD-10-CM | POA: Diagnosis not present

## 2014-08-11 DIAGNOSIS — F0390 Unspecified dementia without behavioral disturbance: Secondary | ICD-10-CM

## 2014-08-11 DIAGNOSIS — Z880 Allergy status to penicillin: Secondary | ICD-10-CM | POA: Diagnosis not present

## 2014-08-11 DIAGNOSIS — I509 Heart failure, unspecified: Secondary | ICD-10-CM

## 2014-08-11 DIAGNOSIS — I482 Chronic atrial fibrillation, unspecified: Secondary | ICD-10-CM | POA: Insufficient documentation

## 2014-08-11 DIAGNOSIS — J90 Pleural effusion, not elsewhere classified: Secondary | ICD-10-CM | POA: Diagnosis not present

## 2014-08-11 DIAGNOSIS — Z87891 Personal history of nicotine dependence: Secondary | ICD-10-CM | POA: Diagnosis not present

## 2014-08-11 DIAGNOSIS — B9689 Other specified bacterial agents as the cause of diseases classified elsewhere: Secondary | ICD-10-CM | POA: Diagnosis not present

## 2014-08-11 DIAGNOSIS — J9600 Acute respiratory failure, unspecified whether with hypoxia or hypercapnia: Secondary | ICD-10-CM | POA: Diagnosis not present

## 2014-08-11 DIAGNOSIS — R0602 Shortness of breath: Secondary | ICD-10-CM | POA: Diagnosis not present

## 2014-08-11 DIAGNOSIS — E876 Hypokalemia: Secondary | ICD-10-CM | POA: Diagnosis present

## 2014-08-11 DIAGNOSIS — R40242 Glasgow coma scale score 9-12: Secondary | ICD-10-CM | POA: Diagnosis present

## 2014-08-11 DIAGNOSIS — Z91018 Allergy to other foods: Secondary | ICD-10-CM | POA: Diagnosis not present

## 2014-08-11 DIAGNOSIS — R32 Unspecified urinary incontinence: Secondary | ICD-10-CM | POA: Diagnosis present

## 2014-08-11 DIAGNOSIS — N390 Urinary tract infection, site not specified: Secondary | ICD-10-CM

## 2014-08-11 DIAGNOSIS — R159 Full incontinence of feces: Secondary | ICD-10-CM | POA: Diagnosis present

## 2014-08-11 DIAGNOSIS — Z86718 Personal history of other venous thrombosis and embolism: Secondary | ICD-10-CM | POA: Diagnosis not present

## 2014-08-11 DIAGNOSIS — J9601 Acute respiratory failure with hypoxia: Secondary | ICD-10-CM

## 2014-08-11 DIAGNOSIS — J9602 Acute respiratory failure with hypercapnia: Secondary | ICD-10-CM | POA: Diagnosis not present

## 2014-08-11 DIAGNOSIS — R41841 Cognitive communication deficit: Secondary | ICD-10-CM | POA: Diagnosis not present

## 2014-08-11 DIAGNOSIS — I517 Cardiomegaly: Secondary | ICD-10-CM | POA: Diagnosis not present

## 2014-08-11 DIAGNOSIS — E785 Hyperlipidemia, unspecified: Secondary | ICD-10-CM | POA: Diagnosis present

## 2014-08-11 DIAGNOSIS — Z881 Allergy status to other antibiotic agents status: Secondary | ICD-10-CM | POA: Diagnosis not present

## 2014-08-11 DIAGNOSIS — I5033 Acute on chronic diastolic (congestive) heart failure: Secondary | ICD-10-CM | POA: Diagnosis not present

## 2014-08-11 DIAGNOSIS — T502X5A Adverse effect of carbonic-anhydrase inhibitors, benzothiadiazides and other diuretics, initial encounter: Secondary | ICD-10-CM | POA: Diagnosis present

## 2014-08-11 DIAGNOSIS — R278 Other lack of coordination: Secondary | ICD-10-CM | POA: Diagnosis not present

## 2014-08-11 DIAGNOSIS — Z882 Allergy status to sulfonamides status: Secondary | ICD-10-CM | POA: Diagnosis not present

## 2014-08-11 DIAGNOSIS — Z515 Encounter for palliative care: Secondary | ICD-10-CM | POA: Diagnosis not present

## 2014-08-11 DIAGNOSIS — J449 Chronic obstructive pulmonary disease, unspecified: Secondary | ICD-10-CM | POA: Diagnosis present

## 2014-08-11 DIAGNOSIS — J9622 Acute and chronic respiratory failure with hypercapnia: Secondary | ICD-10-CM | POA: Diagnosis present

## 2014-08-11 DIAGNOSIS — J9811 Atelectasis: Secondary | ICD-10-CM | POA: Diagnosis not present

## 2014-08-11 DIAGNOSIS — Z9981 Dependence on supplemental oxygen: Secondary | ICD-10-CM | POA: Diagnosis not present

## 2014-08-11 DIAGNOSIS — Z7901 Long term (current) use of anticoagulants: Secondary | ICD-10-CM | POA: Diagnosis not present

## 2014-08-11 DIAGNOSIS — M6281 Muscle weakness (generalized): Secondary | ICD-10-CM | POA: Diagnosis not present

## 2014-08-11 DIAGNOSIS — I4891 Unspecified atrial fibrillation: Secondary | ICD-10-CM | POA: Diagnosis present

## 2014-08-11 DIAGNOSIS — R1312 Dysphagia, oropharyngeal phase: Secondary | ICD-10-CM | POA: Diagnosis not present

## 2014-08-11 DIAGNOSIS — R0902 Hypoxemia: Secondary | ICD-10-CM

## 2014-08-11 DIAGNOSIS — E875 Hyperkalemia: Secondary | ICD-10-CM | POA: Diagnosis present

## 2014-08-11 DIAGNOSIS — E874 Mixed disorder of acid-base balance: Secondary | ICD-10-CM | POA: Diagnosis present

## 2014-08-11 DIAGNOSIS — Z993 Dependence on wheelchair: Secondary | ICD-10-CM

## 2014-08-11 DIAGNOSIS — K219 Gastro-esophageal reflux disease without esophagitis: Secondary | ICD-10-CM | POA: Diagnosis present

## 2014-08-11 DIAGNOSIS — G9340 Encephalopathy, unspecified: Secondary | ICD-10-CM

## 2014-08-11 DIAGNOSIS — R069 Unspecified abnormalities of breathing: Secondary | ICD-10-CM | POA: Diagnosis not present

## 2014-08-11 DIAGNOSIS — M1990 Unspecified osteoarthritis, unspecified site: Secondary | ICD-10-CM | POA: Diagnosis present

## 2014-08-11 DIAGNOSIS — Z9049 Acquired absence of other specified parts of digestive tract: Secondary | ICD-10-CM | POA: Diagnosis present

## 2014-08-11 LAB — CBC WITH DIFFERENTIAL/PLATELET
Basophils Absolute: 0 10*3/uL (ref 0.0–0.1)
Basophils Relative: 0 % (ref 0–1)
EOS PCT: 0 % (ref 0–5)
Eosinophils Absolute: 0 10*3/uL (ref 0.0–0.7)
HCT: 45.9 % (ref 36.0–46.0)
Hemoglobin: 14.3 g/dL (ref 12.0–15.0)
LYMPHS ABS: 1.1 10*3/uL (ref 0.7–4.0)
LYMPHS PCT: 6 % — AB (ref 12–46)
MCH: 31 pg (ref 26.0–34.0)
MCHC: 31.2 g/dL (ref 30.0–36.0)
MCV: 99.6 fL (ref 78.0–100.0)
MONO ABS: 2.4 10*3/uL — AB (ref 0.1–1.0)
MONOS PCT: 12 % (ref 3–12)
NEUTROS ABS: 16.2 10*3/uL — AB (ref 1.7–7.7)
Neutrophils Relative %: 82 % — ABNORMAL HIGH (ref 43–77)
Platelets: 413 10*3/uL — ABNORMAL HIGH (ref 150–400)
RBC: 4.61 MIL/uL (ref 3.87–5.11)
RDW: 16.3 % — ABNORMAL HIGH (ref 11.5–15.5)
WBC: 19.7 10*3/uL — ABNORMAL HIGH (ref 4.0–10.5)

## 2014-08-11 LAB — URINALYSIS, ROUTINE W REFLEX MICROSCOPIC
Bilirubin Urine: NEGATIVE
Glucose, UA: NEGATIVE mg/dL
Ketones, ur: NEGATIVE mg/dL
Nitrite: POSITIVE — AB
Protein, ur: 30 mg/dL — AB
Specific Gravity, Urine: 1.016 (ref 1.005–1.030)
Urobilinogen, UA: 1 mg/dL (ref 0.0–1.0)
pH: 5.5 (ref 5.0–8.0)

## 2014-08-11 LAB — I-STAT TROPONIN, ED: Troponin i, poc: 0.04 ng/mL (ref 0.00–0.08)

## 2014-08-11 LAB — BLOOD GAS, ARTERIAL
Acid-Base Excess: 8.9 mmol/L — ABNORMAL HIGH (ref 0.0–2.0)
Bicarbonate: 35.8 mEq/L — ABNORMAL HIGH (ref 20.0–24.0)
Drawn by: 331471
O2 Content: 4.5 L/min
O2 SAT: 96.8 %
PO2 ART: 95.8 mmHg (ref 80.0–100.0)
Patient temperature: 98.6
TCO2: 31.2 mmol/L (ref 0–100)
pCO2 arterial: 59.6 mmHg (ref 35.0–45.0)
pH, Arterial: 7.396 (ref 7.350–7.450)

## 2014-08-11 LAB — BRAIN NATRIURETIC PEPTIDE: B Natriuretic Peptide: 25.7 pg/mL (ref 0.0–100.0)

## 2014-08-11 LAB — COMPREHENSIVE METABOLIC PANEL
ALBUMIN: 2.8 g/dL — AB (ref 3.5–5.2)
ALT: 15 U/L (ref 0–35)
AST: 23 U/L (ref 0–37)
Alkaline Phosphatase: 89 U/L (ref 39–117)
Anion gap: 11 (ref 5–15)
BILIRUBIN TOTAL: 0.8 mg/dL (ref 0.3–1.2)
BUN: 26 mg/dL — ABNORMAL HIGH (ref 6–23)
CALCIUM: 8.6 mg/dL (ref 8.4–10.5)
CHLORIDE: 95 mmol/L — AB (ref 96–112)
CO2: 34 mmol/L — ABNORMAL HIGH (ref 19–32)
CREATININE: 0.77 mg/dL (ref 0.50–1.10)
GFR calc Af Amer: 84 mL/min — ABNORMAL LOW (ref >90)
GFR calc non Af Amer: 73 mL/min — ABNORMAL LOW (ref >90)
Glucose, Bld: 151 mg/dL — ABNORMAL HIGH (ref 70–99)
Potassium: 4.8 mmol/L (ref 3.5–5.1)
SODIUM: 140 mmol/L (ref 135–145)
Total Protein: 7 g/dL (ref 6.0–8.3)

## 2014-08-11 LAB — LACTIC ACID, PLASMA: Lactic Acid, Venous: 1.5 mmol/L (ref 0.5–2.0)

## 2014-08-11 LAB — DIGOXIN LEVEL: Digoxin Level: 0.7 ng/mL — ABNORMAL LOW (ref 0.8–2.0)

## 2014-08-11 LAB — URINE MICROSCOPIC-ADD ON

## 2014-08-11 LAB — AMMONIA: AMMONIA: 22 umol/L (ref 11–32)

## 2014-08-11 MED ORDER — DEXTROSE 5 % IV SOLN
1.0000 g | Freq: Two times a day (BID) | INTRAVENOUS | Status: AC
  Administered 2014-08-12 – 2014-08-19 (×14): 1 g via INTRAVENOUS
  Filled 2014-08-11 (×16): qty 1

## 2014-08-11 MED ORDER — DIVALPROEX SODIUM 125 MG PO CPSP
125.0000 mg | ORAL_CAPSULE | Freq: Two times a day (BID) | ORAL | Status: DC
  Administered 2014-08-12 – 2014-08-18 (×12): 125 mg via ORAL
  Filled 2014-08-11 (×15): qty 1

## 2014-08-11 MED ORDER — SODIUM CHLORIDE 0.9 % IJ SOLN
3.0000 mL | Freq: Two times a day (BID) | INTRAMUSCULAR | Status: DC
  Administered 2014-08-12 – 2014-08-20 (×15): 3 mL via INTRAVENOUS

## 2014-08-11 MED ORDER — METOLAZONE 2.5 MG PO TABS: 2.5000 mg | ORAL_TABLET | Freq: Every day | ORAL | Status: DC

## 2014-08-11 MED ORDER — ONDANSETRON HCL 4 MG PO TABS: 4.0000 mg | ORAL_TABLET | Freq: Four times a day (QID) | ORAL | Status: DC | PRN

## 2014-08-11 MED ORDER — FUROSEMIDE 10 MG/ML IJ SOLN
60.0000 mg | Freq: Once | INTRAMUSCULAR | Status: AC
  Administered 2014-08-11: 60 mg via INTRAVENOUS
  Filled 2014-08-11: qty 8

## 2014-08-11 MED ORDER — ACETAMINOPHEN 650 MG RE SUPP: 650.0000 mg | Freq: Four times a day (QID) | RECTAL | Status: DC | PRN

## 2014-08-11 MED ORDER — DEXTROSE 5 % IV SOLN
1.0000 g | Freq: Once | INTRAVENOUS | Status: AC
  Administered 2014-08-11: 1 g via INTRAVENOUS
  Filled 2014-08-11: qty 10

## 2014-08-11 MED ORDER — METHYLPREDNISOLONE SODIUM SUCC 125 MG IJ SOLR
125.0000 mg | Freq: Once | INTRAMUSCULAR | Status: AC
  Administered 2014-08-11: 125 mg via INTRAVENOUS
  Filled 2014-08-11: qty 2

## 2014-08-11 MED ORDER — ACETAMINOPHEN 325 MG PO TABS: 650.0000 mg | ORAL_TABLET | Freq: Four times a day (QID) | ORAL | Status: DC | PRN

## 2014-08-11 MED ORDER — BUDESONIDE-FORMOTEROL FUMARATE 160-4.5 MCG/ACT IN AERO
2.0000 | INHALATION_SPRAY | Freq: Two times a day (BID) | RESPIRATORY_TRACT | Status: DC
  Administered 2014-08-12 – 2014-08-20 (×16): 2 via RESPIRATORY_TRACT
  Filled 2014-08-11: qty 6

## 2014-08-11 MED ORDER — ACETAMINOPHEN 500 MG PO TABS: 1000.0000 mg | ORAL_TABLET | Freq: Once | ORAL | Status: DC

## 2014-08-11 MED ORDER — DILTIAZEM HCL ER COATED BEADS 120 MG PO CP24
120.0000 mg | ORAL_CAPSULE | Freq: Every day | ORAL | Status: DC
  Administered 2014-08-12 – 2014-08-20 (×8): 120 mg via ORAL
  Filled 2014-08-11 (×9): qty 1

## 2014-08-11 MED ORDER — TORSEMIDE 20 MG PO TABS
20.0000 mg | ORAL_TABLET | Freq: Two times a day (BID) | ORAL | Status: DC
  Filled 2014-08-11: qty 1

## 2014-08-11 MED ORDER — ONDANSETRON HCL 4 MG/2ML IJ SOLN: 4.0000 mg | Freq: Four times a day (QID) | INTRAMUSCULAR | Status: DC | PRN

## 2014-08-11 MED ORDER — SODIUM CHLORIDE 0.9 % IV SOLN
750.0000 mg | Freq: Two times a day (BID) | INTRAVENOUS | Status: DC
  Filled 2014-08-11 (×2): qty 750

## 2014-08-11 NOTE — ED Notes (Signed)
Bed: YI50 Expected date: 08/11/14 Expected time: 4:59 PM Means of arrival: Ambulance Comments: resp diff

## 2014-08-11 NOTE — ED Notes (Signed)
Courtney Foriucci, P.A. Present when v.s. Obtained and is aware of resp. Rate.

## 2014-08-11 NOTE — Progress Notes (Signed)
ANTIBIOTIC CONSULT NOTE - INITIAL  Pharmacy Consult for Cefepime/Vancomycin Indication: Sepsis  Allergies  Allergen Reactions  . Chocolate Itching  . Ciprofloxacin Itching  . Coffee Bean Extract [Coffea Arabica] Itching  . Coffee Flavor Itching  . Gentamycin [Gentamicin] Itching  . Imodium [Loperamide] Other (See Comments)    unknown  . Penicillins Itching  . Sulfa Antibiotics Itching  . Tea Itching    Patient Measurements:   Wt=92 kg   Vital Signs: Temp: 101.1 F (38.4 C) (02/28 1759) Temp Source: Rectal (02/28 1759) BP: 122/69 mmHg (02/28 2215) Pulse Rate: 93 (02/28 2216) Intake/Output from previous day:   Intake/Output from this shift:    Labs:  Recent Labs  08/11/14 1740  WBC 19.7*  HGB 14.3  PLT 413*  CREATININE 0.77   CrCl cannot be calculated (Unknown ideal weight.). No results for input(s): VANCOTROUGH, VANCOPEAK, VANCORANDOM, GENTTROUGH, GENTPEAK, GENTRANDOM, TOBRATROUGH, TOBRAPEAK, TOBRARND, AMIKACINPEAK, AMIKACINTROU, AMIKACIN in the last 72 hours.   Microbiology: No results found for this or any previous visit (from the past 720 hour(s)).  Medical History: Past Medical History  Diagnosis Date  . Arrhythmia   . Arthritis   . Atrial fibrillation     Remains stable (As of 05/09/13)  . H/O blood clots   . Hyperlipemia   . Dementia     Remains stable & continues to function adequately in the current living environment with supervision. Has had little changes in behavior (AS OF 05/09/13)  . GERD (gastroesophageal reflux disease)     Stable (As of 05/09/13)  . Osteoarthritis     Denies pain (As of 05/09/13)  . CHF (congestive heart failure)     chronic diastolic  . COPD (chronic obstructive pulmonary disease)     w/exacerbation    Medications:  Scheduled:  . acetaminophen  1,000 mg Oral Once  . budesonide-formoterol  2 puff Inhalation BID  . [START ON 08/12/2014] diltiazem  120 mg Oral Daily  . [START ON 08/12/2014] divalproex  125 mg  Oral BID  . [START ON 08/12/2014] metolazone  2.5 mg Oral Daily  . sodium chloride  3 mL Intravenous Q12H  . [START ON 08/12/2014] torsemide  20 mg Oral BID   Infusions:  . ceFEPime (MAXIPIME) IV    . vancomycin (VANCOCIN) 750 mg IVPB     Assessment: 6 yoF c/o SOB/wheezing.  Cefepime and Vancomycin per Rx for Sepsis.   Goal of Therapy:  Vancomycin trough level 15-20 mcg/ml  Plan:   Cefepime 1Gm IV q12h  Vancomycin 750mg  IV q12h  F/u SCr/cultures/levels  Dorrene German 08/11/2014,11:32 PM

## 2014-08-11 NOTE — ED Notes (Signed)
Patient transported to CT 

## 2014-08-11 NOTE — ED Provider Notes (Signed)
CSN: [id number]     Arrival date & time 08/11/14  1703 History   First MD Initiated Contact with Patient 08/11/14 1706     Chief Complaint  Patient presents with  . Shortness of Breath   HPI  Patient is an 81 year old female with past medical history of atrial fibrillation, CHF, COPD, and dementia presents to the emergency room via EMS from a skilled nursing facility for evaluation of shortness of breath 2 days. Patient's family states that they went to visit her yesterday and she seemed to have some accessory muscle usage last night. When they went to visit her today she had increased difficulty with breathing. Patient does have a history of COPD and uses oxygen as needed. She does not like using oxygen. Patient is not baseline on oxygen chronically. Daughters report that she has not been coughing. Patient is currently on blood thinners. There report a history of DVT remotely. Patient does have a DO NOT RESUSCITATE on file. Patient's family is okay with the use of BiPAP.   Past Medical History  Diagnosis Date  . Arrhythmia   . Arthritis   . Atrial fibrillation     Remains stable (As of 05/09/13)  . H/O blood clots   . Hyperlipemia   . Dementia     Remains stable & continues to function adequately in the current living environment with supervision. Has had little changes in behavior (AS OF 05/09/13)  . GERD (gastroesophageal reflux disease)     Stable (As of 05/09/13)  . Osteoarthritis     Denies pain (As of 05/09/13)  . CHF (congestive heart failure)     chronic diastolic  . COPD (chronic obstructive pulmonary disease)     w/exacerbation   Past Surgical History  Procedure Laterality Date  . Appendectomy    . Gallbladder surgery    . Hip surgery      Right-Metal plate   . Leg surgery      Left leg   Family History  Problem Relation Age of Onset  . Colon cancer Neg Hx   . Heart disease Father   . Breast cancer Daughter    History  Substance Use Topics  . Smoking status:  Former Research scientist (life sciences)  . Smokeless tobacco: Never Used  . Alcohol Use: No   OB History    No data available     Review of Systems L5 caveat dementia   Allergies  Chocolate; Ciprofloxacin; Coffee bean extract; Coffee flavor; Gentamycin; Imodium; Penicillins; Sulfa antibiotics; and Tea  Home Medications   Prior to Admission medications   Medication Sig Start Date End Date Taking? Authorizing Provider  acetaminophen (TYLENOL) 500 MG tablet Take 500 mg by mouth 3 (three) times daily.    Yes Historical Provider, MD  budesonide-formoterol (SYMBICORT) 160-4.5 MCG/ACT inhaler Inhale 2 puffs into the lungs 2 (two) times daily.   Yes Historical Provider, MD  Calcium Carbonate-Vitamin D (CALTRATE 600+D PO) Take 1 tablet by mouth every morning. For calcium supplement   Yes Historical Provider, MD  digoxin (LANOXIN) 0.125 MG tablet Take 0.125 mg by mouth every other day.   Yes Historical Provider, MD  digoxin (LANOXIN) 0.25 MG tablet Take 0.25 mg by mouth every other day. Alternate between  0.125 and 0.250   Yes Historical Provider, MD  diltiazem (CARDIZEM CD) 120 MG 24 hr capsule Take 120 mg by mouth daily. For A-Fib 07/02/13  Yes Bobby Rumpf York, PA-C  divalproex (DEPAKOTE SPRINKLE) 125 MG capsule Take 125 mg by mouth  2 (two) times daily. For mood stabilzer   Yes Historical Provider, MD  Docusate Sodium (DSS) 100 MG CAPS Take 100 mg by mouth daily. For constipation 07/02/13  Yes Bobby Rumpf York, PA-C  furosemide (LASIX) 40 MG tablet Take 40 mg by mouth 2 (two) times daily.   Yes Historical Provider, MD  metolazone (ZAROXOLYN) 2.5 MG tablet Take 2.5 mg by mouth daily.   Yes Historical Provider, MD  OLANZapine (ZYPREXA) 5 MG tablet Take 5 mg by mouth at bedtime.   Yes Historical Provider, MD  Polyethyl Glycol-Propyl Glycol (SYSTANE OP) Place 1 drop into both eyes 2 (two) times daily. For dry eyes   Yes Historical Provider, MD  potassium chloride SA (K-DUR,KLOR-CON) 20 MEQ tablet Take 20 mEq by mouth 2 (two)  times daily.   Yes Historical Provider, MD  Rivaroxaban (XARELTO) 20 MG TABS tablet Take 20 mg by mouth daily. For DVT   Yes Historical Provider, MD  torsemide (DEMADEX) 20 MG tablet Take 1 tablet (20 mg total) by mouth 2 (two) times daily. 02/05/14  Yes Dorothy Spark, MD   BP 116/72 mmHg  Pulse 109  Temp(Src) 101.1 F (38.4 C) (Rectal)  Resp 37  SpO2 95% Physical Exam  Constitutional: She appears well-developed and well-nourished. No distress.  HENT:  Head: Normocephalic and atraumatic.  Mouth/Throat: Oropharynx is clear and moist. No oropharyngeal exudate.  Eyes: Conjunctivae and EOM are normal. Pupils are equal, round, and reactive to light. No scleral icterus.  Neck: Normal range of motion. Neck supple. No JVD present. No thyromegaly present.  Cardiovascular: Normal rate, regular rhythm and intact distal pulses.  Exam reveals no gallop and no friction rub.   No murmur heard. Pulmonary/Chest: Accessory muscle usage present. Tachypnea noted. She is in respiratory distress. She has decreased breath sounds in the right lower field and the left lower field. She has no wheezes. She has no rales.  Abdominal: Soft. Bowel sounds are normal. She exhibits no distension and no mass. There is no tenderness. There is no rebound and no guarding.  Musculoskeletal: Normal range of motion.  Lymphadenopathy:    She has no cervical adenopathy.  Neurological: She is alert.  Patient moves all 4 extremities. Sensation to light touch is intact. Patient follows commands.  Skin: Skin is warm and dry. She is not diaphoretic.  Psychiatric: She has a normal mood and affect. Her behavior is normal. Judgment and thought content normal.  Nursing note and vitals reviewed.   ED Course  Procedures (including critical care time) Labs Review Labs Reviewed  CBC WITH DIFFERENTIAL/PLATELET - Abnormal; Notable for the following:    WBC 19.7 (*)    RDW 16.3 (*)    Platelets 413 (*)    Neutrophils Relative % 82  (*)    Neutro Abs 16.2 (*)    Lymphocytes Relative 6 (*)    Monocytes Absolute 2.4 (*)    All other components within normal limits  COMPREHENSIVE METABOLIC PANEL - Abnormal; Notable for the following:    Chloride 95 (*)    CO2 34 (*)    Glucose, Bld 151 (*)    BUN 26 (*)    Albumin 2.8 (*)    GFR calc non Af Amer 73 (*)    GFR calc Af Amer 84 (*)    All other components within normal limits  BLOOD GAS, ARTERIAL - Abnormal; Notable for the following:    pCO2 arterial 59.6 (*)    Bicarbonate 35.8 (*)  Acid-Base Excess 8.9 (*)    All other components within normal limits  URINALYSIS, ROUTINE W REFLEX MICROSCOPIC - Abnormal; Notable for the following:    Color, Urine AMBER (*)    APPearance CLOUDY (*)    Hgb urine dipstick SMALL (*)    Protein, ur 30 (*)    Nitrite POSITIVE (*)    Leukocytes, UA LARGE (*)    All other components within normal limits  URINE MICROSCOPIC-ADD ON - Abnormal; Notable for the following:    Bacteria, UA MANY (*)    All other components within normal limits  BRAIN NATRIURETIC PEPTIDE  I-STAT TROPOININ, ED    Imaging Review Dg Chest Portable 1 View  08/11/2014   CLINICAL DATA:  Shortness of breath, weakness, lethargy  EXAM: PORTABLE CHEST - 1 VIEW  COMPARISON:  02/22/2014  FINDINGS: Cardiomegaly with pulmonary vascular congestion and possible mild interstitial edema. Suspected layering left pleural effusion. No pneumothorax.  IMPRESSION: Cardiomegaly with possible mild interstitial edema.  Suspected layering left pleural effusion.   Electronically Signed   By: [name] M.D.   On: 08/11/2014 18:26     EKG Interpretation   Date/Time:  Sunday August 11 2014 17:22:53 EST Ventricular Rate:  111 PR Interval:    QRS Duration: 80 QT Interval:  286 QTC Calculation: 389 R Axis:   67 Text Interpretation:  Atrial fibrillation Low voltage, precordial leads  Nonspecific T abnormalities, lateral leads Baseline wander in lead(s) V4  Confirmed by  COOK  MD, BRIAN ([id number]) on 08/11/2014 6:19:28 PM      MDM   Final diagnoses:  Acute on chronic congestive heart failure, unspecified congestive heart failure type  UTI (lower urinary tract infection)  Chronic atrial fibrillation   Patient is an 81 year old female who presents emergency room for evaluation of shortness of breath. Physical exam reveals a tachypneic patient with accessory muscle usage. Patient is febrile here. Chest x-ray reveals layering pleural effusions with interstitial edema. EKG shows baseline wander with nonspecific T-wave abnormalities and A. fib which is chronic for the patient. He shows a leukocytosis of nearly 20,000. ABG reveals makes respiratory alkalosis with metabolic acidosis. UA does show UTI. We'll send for culture. BNP unremarkable. I-STAT troponin negative. Suspect that this is a combination of urinary tract infection in addition to CHF exacerbation.  Ceftriaxone here. Patient given Tylenol. Patient also given 60 mg of Lasix. I spoken with the hospitalist Dr Posey Pronto who will come to see the patient and determine whether they need telemetry versus stepdown. Patient seen by and discussed with Dr. Lacinda Axon who agrees the above workup and plan.    Cherylann Parr, PA-C 08/11/14 2129  Nat Christen, MD 08/14/14 1534

## 2014-08-11 NOTE — H&P (Addendum)
[facility] History and Physical  Patient: Jennifer Nielsen  MRN: [id number]  DOB: 08/28/1924  DOS: the patient was seen and examined on 08/11/2014 PCP: [name], MD  Chief Complaint: Shortness of breath and fatigue  HPI: Jennifer Nielsen is a 81 y.o. female with Past medical history of atrial fibrillation, on Xarelto, GERD, chronic diastolic CHF, dementia, COPD not on oxygen, bedbound. The patient is presenting with complaints of progressively worsening fatigue as well as shortness of breath. The history was up and from patient's daughter who are at bedside and are visiting the patient on a regular daily basis. Since last one month they have noticed that the patient has been becoming more and more fatigue and lethargic. Increasingly sleepy easily. There was no fall no trauma no injury. Patient at her baseline one month ago was fairly communicative. Still remain bedbound and wheelchair-bound. Patient did not complain of any chest pain any abdominal pain does not have any constipation does not have any vomiting did not complain of any burning. At her baseline she is incontinent of bowel and urine. There is no recent change in her medications as per the family. Since last to this the patient has been more short of breath and says it has been worsening and she is more lethargic they brought her to the hospital.  The patient is coming from ALF. And at her baseline dependent for most of her ADL.  Review of Systems: as mentioned in the history of present illness.  A Comprehensive review of the other systems is negative.  Past Medical History  Diagnosis Date  . Arrhythmia   . Arthritis   . Atrial fibrillation     Remains stable (As of 05/09/13)  . H/O blood clots   . Hyperlipemia   . Dementia     Remains stable & continues to function adequately in the current living environment with supervision. Has had little changes in behavior (AS OF 05/09/13)  . GERD (gastroesophageal reflux disease)      Stable (As of 05/09/13)  . Osteoarthritis     Denies pain (As of 05/09/13)  . CHF (congestive heart failure)     chronic diastolic  . COPD (chronic obstructive pulmonary disease)     w/exacerbation   Past Surgical History  Procedure Laterality Date  . Appendectomy    . Gallbladder surgery    . Hip surgery      Right-Metal plate   . Leg surgery      Left leg   Social History:  reports that she has quit smoking. She has never used smokeless tobacco. She reports that she does not drink alcohol or use illicit drugs.  Allergies  Allergen Reactions  . Chocolate Itching  . Ciprofloxacin Itching  . Coffee Bean Extract [Coffea Arabica] Itching  . Coffee Flavor Itching  . Gentamycin [Gentamicin] Itching  . Imodium [Loperamide] Other (See Comments)    unknown  . Penicillins Itching  . Sulfa Antibiotics Itching  . Tea Itching    Family History  Problem Relation Age of Onset  . Colon cancer Neg Hx   . Heart disease Father   . Breast cancer Daughter     Prior to Admission medications   Medication Sig Start Date End Date Taking? Authorizing Provider  acetaminophen (TYLENOL) 500 MG tablet Take 500 mg by mouth 3 (three) times daily.    Yes Historical Provider, MD  budesonide-formoterol (SYMBICORT) 160-4.5 MCG/ACT inhaler Inhale 2 puffs into the lungs 2 (two) times daily.   Yes Historical  Provider, MD  Calcium Carbonate-Vitamin D (CALTRATE 600+D PO) Take 1 tablet by mouth every morning. For calcium supplement   Yes Historical Provider, MD  digoxin (LANOXIN) 0.125 MG tablet Take 0.125 mg by mouth every other day.   Yes Historical Provider, MD  digoxin (LANOXIN) 0.25 MG tablet Take 0.25 mg by mouth every other day. Alternate between  0.125 and 0.250   Yes Historical Provider, MD  diltiazem (CARDIZEM CD) 120 MG 24 hr capsule Take 120 mg by mouth daily. For A-Fib 07/02/13  Yes Bobby Rumpf York, PA-C  divalproex (DEPAKOTE SPRINKLE) 125 MG capsule Take 125 mg by mouth 2 (two) times daily.  For mood stabilzer   Yes Historical Provider, MD  Docusate Sodium (DSS) 100 MG CAPS Take 100 mg by mouth daily. For constipation 07/02/13  Yes Bobby Rumpf York, PA-C  metolazone (ZAROXOLYN) 2.5 MG tablet Take 2.5 mg by mouth daily.   Yes Historical Provider, MD  OLANZapine (ZYPREXA) 5 MG tablet Take 5 mg by mouth at bedtime.   Yes Historical Provider, MD  Polyethyl Glycol-Propyl Glycol (SYSTANE OP) Place 1 drop into both eyes 2 (two) times daily. For dry eyes   Yes Historical Provider, MD  potassium chloride SA (K-DUR,KLOR-CON) 20 MEQ tablet Take 20 mEq by mouth 2 (two) times daily.   Yes Historical Provider, MD  Rivaroxaban (XARELTO) 20 MG TABS tablet Take 20 mg by mouth daily. For DVT   Yes Historical Provider, MD  torsemide (DEMADEX) 20 MG tablet Take 1 tablet (20 mg total) by mouth 2 (two) times daily. 02/05/14  Yes Dorothy Spark, MD    Physical Exam: Filed Vitals:   08/11/14 2130 08/11/14 2200 08/11/14 2215 08/11/14 2216  BP: 136/86 143/74 122/69   Pulse: 88 91 105 93  Temp:      TempSrc:      Resp:      SpO2: 96% 93% 93% 94%    General: Alert, Awake and Oriented to Person. Appear in moderate distress Follows command, does not on answers appropriately Eyes: PERRL ENT: Oral Mucosa clear moist. Neck: difficult to assess JVD Cardiovascular: S1 and S2 Present, no Murmur, Peripheral Pulses Present Respiratory: Bilateral Air entry equal and Decreased, basal Crackles, no wheezes Abdomen: Bowel Sound present, Soft and non tender Skin: no Rash Extremities: bilateral Pedal edema, no calf tenderness Neurologic: Grossly no focal neuro deficit other than generalized weakness and lack of orientation and attention.  Labs on Admission:  CBC:  Recent Labs Lab 08/11/14 1740  WBC 19.7*  NEUTROABS 16.2*  HGB 14.3  HCT 45.9  MCV 99.6  PLT 413*    CMP     Component Value Date/Time   NA 140 08/11/2014 1740   NA 134* 02/25/2014   K 4.8 08/11/2014 1740   CL 95* 08/11/2014 1740    CO2 34* 08/11/2014 1740   GLUCOSE 151* 08/11/2014 1740   BUN 26* 08/11/2014 1740   BUN 20 02/25/2014   CREATININE 0.77 08/11/2014 1740   CREATININE 0.6 02/25/2014   CREATININE 0.80 02/22/2014 1214   CALCIUM 8.6 08/11/2014 1740   PROT 7.0 08/11/2014 1740   ALBUMIN 2.8* 08/11/2014 1740   AST 23 08/11/2014 1740   ALT 15 08/11/2014 1740   ALKPHOS 89 08/11/2014 1740   BILITOT 0.8 08/11/2014 1740   GFRNONAA 73* 08/11/2014 1740   GFRAA 84* 08/11/2014 1740    No results for input(s): LIPASE, AMYLASE in the last 168 hours.  No results for input(s): CKTOTAL, CKMB, CKMBINDEX, TROPONINI in the last  168 hours. BNP (last 3 results)  Recent Labs  08/11/14 1740  BNP 25.7    ProBNP (last 3 results)  Recent Labs  09/19/13 1258 02/22/14 1417 06/17/14 1050  PROBNP 4084.0* 2244.0* 170.0*     Radiological Exams on Admission: Dg Chest Portable 1 View  08/11/2014   CLINICAL DATA:  Shortness of breath, weakness, lethargy  EXAM: PORTABLE CHEST - 1 VIEW  COMPARISON:  02/22/2014  FINDINGS: Cardiomegaly with pulmonary vascular congestion and possible mild interstitial edema. Suspected layering left pleural effusion. No pneumothorax.  IMPRESSION: Cardiomegaly with possible mild interstitial edema.  Suspected layering left pleural effusion.   Electronically Signed   By: [name] M.D.   On: 08/11/2014 18:26    EKG: Independently reviewed. atrial fibrillation, rate controlled.  Assessment/Plan Principal Problem:   Sepsis Active Problems:   Long term current use of anticoagulant therapy   Atrial fibrillation   Dementia   Acute on chronic diastolic heart failure   Acute respiratory failure with hypoxia and hypercarbia   UTI (lower urinary tract infection)   Acute encephalopathy   Wheelchair dependent   1. Sepsis The pt has evidence of source of infection in urine and possible respiratory infection as well. She has Hypoxia requiring 4-5 LPM of O2,  Partial Pressure of Oxygen 12.8  kPa, Fraction of Inhaled O2 38% GCS 12, Suggesting SOFA score of 4. She will be treated with vancomycin and cefepime and will be admitted to stepdown.  Check valproate level, ammonia level and digoxin level.  2.acute on chronic diastolic CHF Will use bipap as needed  She has received lasix and will monitor ins and out.  3.acute respiratory failure Will continue with abx and add solumedrol  4.atrial fibrillation Continue xarelto Continue digoxin  Advance goals of care discussion: DNR DNI  DVT Prophylaxis: on chronic therapeutic anticoagulation. Nutrition: npo except medication  Family Communication: family was present at bedside, opportunity was given to ask question and all questions were answered satisfactorily at the time of interview. Disposition: Admitted to inpatient in step-down unit.  Author: Berle Mull, MD [facility] Pager: [phone] 08/11/2014, 10:28 PM    Addendum: Pt's mental status has declined and now she is not following commands Also was hypoxic to 70s Placed on bipap and will check ABG at 1 am Daughter informed. Prognosis guarded.  Ritter Helsley 12:52 AM 08/12/2014   If 7PM-7AM, please contact night-coverage [email or web address] Password [email or web address]

## 2014-08-11 NOTE — ED Notes (Signed)
Staff at her nursing home [facility]) found pt. To be short of breath and wheezing today--phoned EMS.  She arrives tachypneic, but in no distress.  Her daughters arrive shortly after her arrival.

## 2014-08-12 ENCOUNTER — Encounter (HOSPITAL_COMMUNITY): Payer: Self-pay

## 2014-08-12 DIAGNOSIS — Z515 Encounter for palliative care: Secondary | ICD-10-CM

## 2014-08-12 LAB — CBC WITH DIFFERENTIAL/PLATELET
BASOS ABS: 0 10*3/uL (ref 0.0–0.1)
Basophils Relative: 0 % (ref 0–1)
EOS PCT: 0 % (ref 0–5)
Eosinophils Absolute: 0 10*3/uL (ref 0.0–0.7)
HCT: 44.5 % (ref 36.0–46.0)
HEMOGLOBIN: 13.4 g/dL (ref 12.0–15.0)
Lymphocytes Relative: 7 % — ABNORMAL LOW (ref 12–46)
Lymphs Abs: 0.9 10*3/uL (ref 0.7–4.0)
MCH: 30.3 pg (ref 26.0–34.0)
MCHC: 30.1 g/dL (ref 30.0–36.0)
MCV: 100.7 fL — ABNORMAL HIGH (ref 78.0–100.0)
MONO ABS: 0.4 10*3/uL (ref 0.1–1.0)
Monocytes Relative: 3 % (ref 3–12)
Neutro Abs: 11.6 10*3/uL — ABNORMAL HIGH (ref 1.7–7.7)
Neutrophils Relative %: 90 % — ABNORMAL HIGH (ref 43–77)
Platelets: 362 10*3/uL (ref 150–400)
RBC: 4.42 MIL/uL (ref 3.87–5.11)
RDW: 16.2 % — ABNORMAL HIGH (ref 11.5–15.5)
WBC: 12.9 10*3/uL — ABNORMAL HIGH (ref 4.0–10.5)

## 2014-08-12 LAB — TSH: TSH: 4.094 u[IU]/mL (ref 0.350–4.500)

## 2014-08-12 LAB — COMPREHENSIVE METABOLIC PANEL
ALK PHOS: 84 U/L (ref 39–117)
ALT: 12 U/L (ref 0–35)
AST: 22 U/L (ref 0–37)
Albumin: 2.6 g/dL — ABNORMAL LOW (ref 3.5–5.2)
Anion gap: 12 (ref 5–15)
BUN: 28 mg/dL — AB (ref 6–23)
CHLORIDE: 95 mmol/L — AB (ref 96–112)
CO2: 33 mmol/L — ABNORMAL HIGH (ref 19–32)
CREATININE: 0.72 mg/dL (ref 0.50–1.10)
Calcium: 8.5 mg/dL (ref 8.4–10.5)
GFR calc non Af Amer: 74 mL/min — ABNORMAL LOW (ref >90)
GFR, EST AFRICAN AMERICAN: 86 mL/min — AB (ref >90)
Glucose, Bld: 178 mg/dL — ABNORMAL HIGH (ref 70–99)
POTASSIUM: 4.6 mmol/L (ref 3.5–5.1)
Sodium: 140 mmol/L (ref 135–145)
TOTAL PROTEIN: 6.8 g/dL (ref 6.0–8.3)
Total Bilirubin: 0.8 mg/dL (ref 0.3–1.2)

## 2014-08-12 LAB — BLOOD GAS, ARTERIAL
ACID-BASE EXCESS: 8.9 mmol/L — AB (ref 0.0–2.0)
ACID-BASE EXCESS: 10.5 mmol/L — AB (ref 0.0–2.0)
Bicarbonate: 35.5 mEq/L — ABNORMAL HIGH (ref 20.0–24.0)
Bicarbonate: 37.2 mEq/L — ABNORMAL HIGH (ref 20.0–24.0)
DELIVERY SYSTEMS: POSITIVE
DRAWN BY: 308601
Delivery systems: POSITIVE
Drawn by: 308601
EXPIRATORY PAP: 6
EXPIRATORY PAP: 6
FIO2: 1 %
FIO2: 1 %
INSPIRATORY PAP: 16
Inspiratory PAP: 16
MODE: POSITIVE
Mode: POSITIVE
O2 Saturation: 99.2 %
O2 Saturation: 99.1 %
PATIENT TEMPERATURE: 98.6
PATIENT TEMPERATURE: 98.6
PCO2 ART: 58.7 mmHg — AB (ref 35.0–45.0)
PH ART: 7.409 (ref 7.350–7.450)
TCO2: 31.3 mmol/L (ref 0–100)
TCO2: 32.8 mmol/L (ref 0–100)
pCO2 arterial: 60.1 mmHg (ref 35.0–45.0)
pH, Arterial: 7.399 (ref 7.350–7.450)
pO2, Arterial: 158 mmHg — ABNORMAL HIGH (ref 80.0–100.0)
pO2, Arterial: 153 mmHg — ABNORMAL HIGH (ref 80.0–100.0)

## 2014-08-12 LAB — PROTIME-INR
INR: 1.24 (ref 0.00–1.49)
Prothrombin Time: 15.7 seconds — ABNORMAL HIGH (ref 11.6–15.2)

## 2014-08-12 LAB — VALPROIC ACID LEVEL: Valproic Acid Lvl: 28.8 ug/mL — ABNORMAL LOW (ref 50.0–100.0)

## 2014-08-12 LAB — MRSA PCR SCREENING: MRSA BY PCR: NEGATIVE

## 2014-08-12 LAB — LACTIC ACID, PLASMA: LACTIC ACID, VENOUS: 1.3 mmol/L (ref 0.5–2.0)

## 2014-08-12 MED ORDER — RIVAROXABAN 20 MG PO TABS
20.0000 mg | ORAL_TABLET | ORAL | Status: DC
  Administered 2014-08-12 – 2014-08-20 (×9): 20 mg via ORAL
  Filled 2014-08-12 (×13): qty 1

## 2014-08-12 MED ORDER — FUROSEMIDE 10 MG/ML IJ SOLN
60.0000 mg | Freq: Every day | INTRAMUSCULAR | Status: DC
  Administered 2014-08-12 – 2014-08-13 (×2): 60 mg via INTRAVENOUS
  Filled 2014-08-12 (×2): qty 6

## 2014-08-12 MED ORDER — VANCOMYCIN HCL IN DEXTROSE 750-5 MG/150ML-% IV SOLN
750.0000 mg | Freq: Two times a day (BID) | INTRAVENOUS | Status: DC
  Administered 2014-08-12 – 2014-08-14 (×5): 750 mg via INTRAVENOUS
  Filled 2014-08-12 (×5): qty 150

## 2014-08-12 MED ORDER — METHYLPREDNISOLONE SODIUM SUCC 125 MG IJ SOLR
60.0000 mg | Freq: Two times a day (BID) | INTRAMUSCULAR | Status: DC
  Administered 2014-08-12 – 2014-08-14 (×6): 60 mg via INTRAVENOUS
  Filled 2014-08-12 (×6): qty 2

## 2014-08-12 MED ORDER — CHLORHEXIDINE GLUCONATE 0.12 % MT SOLN
15.0000 mL | Freq: Two times a day (BID) | OROMUCOSAL | Status: DC
Start: 2014-08-12 — End: 2014-08-20
  Administered 2014-08-12 – 2014-08-20 (×15): 15 mL via OROMUCOSAL
  Filled 2014-08-12 (×16): qty 15

## 2014-08-12 MED ORDER — CETYLPYRIDINIUM CHLORIDE 0.05 % MT LIQD
7.0000 mL | Freq: Two times a day (BID) | OROMUCOSAL | Status: DC
  Administered 2014-08-12 – 2014-08-19 (×15): 7 mL via OROMUCOSAL

## 2014-08-12 NOTE — Progress Notes (Signed)
RT removed PT from BiPAP- PT is awake and oriented to date and place. RN aware PT is on 4 lpm [place]- Sp02 92%, HR 74, rr25, BP 125/64.

## 2014-08-12 NOTE — Progress Notes (Signed)
CRITICAL VALUE ALERT  Critical value received: CO2 58.7   Date of notification: 2/29/2015  Time of notification:  0115  Critical value read back:Yes.    Nurse who received alert: Druscilla Brownie   MD notified (1st page):  Alene Mires  Time of first page:  0118  MD notified (2nd page):  Time of second page:  Responding MD:  Alene Mires  Time MD responded: [phone]

## 2014-08-12 NOTE — Progress Notes (Signed)
Received pt from ED. Pt only responded to sternal rub and on nasal cannula 2L. Pt tachypnic with use of accessory muscles. Respiratory called and BiPAP started. MD aware of pt acute change in mental status. No other orders given. Will continue to monitor.

## 2014-08-12 NOTE — Progress Notes (Signed)
ANTICOAGULATION CONSULT NOTE - Initial Consult  Pharmacy Consult for Xarelto Indication: A-fib  Allergies  Allergen Reactions  . Chocolate Itching  . Ciprofloxacin Itching  . Coffee Bean Extract [Coffea Arabica] Itching  . Coffee Flavor Itching  . Gentamycin [Gentamicin] Itching  . Imodium [Loperamide] Other (See Comments)    unknown  . Penicillins Itching  . Sulfa Antibiotics Itching  . Tea Itching    Patient Measurements: Height: 5\' 4"  (162.6 cm) Weight: 190 lb 0.6 oz (86.2 kg) IBW/kg (Calculated) : 54.7   Vital Signs: Temp: 97.7 F (36.5 C) (02/29 0004) Temp Source: Axillary (02/29 0004) BP: 124/72 mmHg (02/29 0007) Pulse Rate: 83 (02/29 0007)  Labs:  Recent Labs  08/11/14 1740  HGB 14.3  HCT 45.9  PLT 413*  CREATININE 0.77    Estimated Creatinine Clearance: 51.6 mL/min (by C-G formula based on Cr of 0.77).   Medical History: Past Medical History  Diagnosis Date  . Arrhythmia   . Arthritis   . Atrial fibrillation     Remains stable (As of 05/09/13)  . H/O blood clots   . Hyperlipemia   . Dementia     Remains stable & continues to function adequately in the current living environment with supervision. Has had little changes in behavior (AS OF 05/09/13)  . GERD (gastroesophageal reflux disease)     Stable (As of 05/09/13)  . Osteoarthritis     Denies pain (As of 05/09/13)  . CHF (congestive heart failure)     chronic diastolic  . COPD (chronic obstructive pulmonary disease)     w/exacerbation    Medications:  Scheduled:  . antiseptic oral rinse  7 mL Mouth Rinse q12n4p  . budesonide-formoterol  2 puff Inhalation BID  . ceFEPime (MAXIPIME) IV  1 g Intravenous Q12H  . chlorhexidine  15 mL Mouth Rinse BID  . diltiazem  120 mg Oral Daily  . divalproex  125 mg Oral BID  . furosemide  60 mg Intravenous Daily  . methylPREDNISolone (SOLU-MEDROL) injection  60 mg Intravenous Q12H  . rivaroxaban  20 mg Oral Q24H  . sodium chloride  3 mL Intravenous  Q12H  . vancomycin (VANCOCIN) 750 mg IVPB  750 mg Intravenous Q12H   Infusions:    Assessment: 53 yoF admitted with SOB and fatigue, on chronic Xarelto for Afib.  Goal of Therapy:  Anticoagulation   Plan:   Xarelto 20mg  daily  F/u scr/s/s bleeding  Dorrene German 08/12/2014,1:56 AM

## 2014-08-12 NOTE — Consult Note (Signed)
Palliative Medicine Team at [facility]  Date: 08/12/2014   Patient Name: Jennifer Nielsen  DOB: 03-21-1925  MRN: [id number]  Age / Sex: 81 y.o., female   PCP: Jennifer Pardon, MD Referring Physician: Bonnielee Haff, MD  Active Problems: Principal Problem:   Sepsis Active Problems:   Long term current use of anticoagulant therapy   Atrial fibrillation   Dementia   Acute on chronic diastolic heart failure   Acute respiratory failure with hypoxia and hypercarbia   UTI (lower urinary tract infection)   Acute encephalopathy   Wheelchair dependent   HPI/Reason for Consultation: [name] is a 81 y.o. female who has lived for the past 3 years at [facility] in long term care-history provided by her daughter at bedside. Mostly wheel chair bound but daughter says she is mentally pretty sharp most of the time and satisfied by her QOL as far as she knows. They are pleased with the care there but for the past few weeks [name] has been "not herself". More lethargic, sleeping more. Her daughter thinks that the UTI could have been detected sooner. She tells me her mother has been "this bad off" before-even with bipap and always pulls through and gets better. They are expecting complete recovery. PMT consulted for goals of care. She is currently almost completely unresponsive even to deep sternal rub on the bipap-high risk for aspiration and complications.  Participants in Discussion: HCPOA: yes   Advance Directive:    Code Status Orders        Start     Ordered   08/11/14 2228  Do not attempt resuscitation (DNR)   Continuous    Question Answer Comment  In the event of cardiac or respiratory ARREST Do not call a "code blue"   In the event of cardiac or respiratory ARREST Do not perform Intubation, CPR, defibrillation or ACLS   In the event of cardiac or respiratory ARREST Use medication by any route, position, wound care, and other measures to relive pain and suffering. May use oxygen, suction  and manual treatment of airway obstruction as needed for comfort.      08/11/14 2228       @ADVDIR @  I have reviewed the medical record, interviewed the patient and family, and examined the patient. The following aspects are pertinent.  Past Medical History  Diagnosis Date  . Arrhythmia   . Arthritis   . Atrial fibrillation     Remains stable (As of 05/09/13)  . H/O blood clots   . Hyperlipemia   . Dementia     Remains stable & continues to function adequately in the current living environment with supervision. Has had little changes in behavior (AS OF 05/09/13)  . GERD (gastroesophageal reflux disease)     Stable (As of 05/09/13)  . Osteoarthritis     Denies pain (As of 05/09/13)  . CHF (congestive heart failure)     chronic diastolic  . COPD (chronic obstructive pulmonary disease)     w/exacerbation   History   Social History  . Marital Status: Single    Spouse Name: N/A  . Number of Children: 2  . Years of Education: N/A   Occupational History  . Retired     Pharmacist, hospital   Social History Main Topics  . Smoking status: Former Research scientist (life sciences)  . Smokeless tobacco: Never Used  . Alcohol Use: No  . Drug Use: No  . Sexual Activity: No   Other Topics Concern  . None   Social  History Narrative   No caffeine    Family History  Problem Relation Age of Onset  . Colon cancer Neg Hx   . Heart disease Father   . Breast cancer Daughter    Scheduled Meds: . antiseptic oral rinse  7 mL Mouth Rinse q12n4p  . budesonide-formoterol  2 puff Inhalation BID  . ceFEPime (MAXIPIME) IV  1 g Intravenous Q12H  . chlorhexidine  15 mL Mouth Rinse BID  . diltiazem  120 mg Oral Daily  . divalproex  125 mg Oral BID  . furosemide  60 mg Intravenous Daily  . methylPREDNISolone (SOLU-MEDROL) injection  60 mg Intravenous Q12H  . rivaroxaban  20 mg Oral Q24H  . sodium chloride  3 mL Intravenous Q12H  . vancomycin  750 mg Intravenous Q12H   Continuous Infusions:  PRN Meds:.acetaminophen  **OR** acetaminophen, ondansetron **OR** ondansetron (ZOFRAN) IV Allergies  Allergen Reactions  . Chocolate Itching  . Ciprofloxacin Itching  . Coffee Bean Extract [Coffea Arabica] Itching  . Coffee Flavor Itching  . Gentamycin [Gentamicin] Itching  . Imodium [Loperamide] Other (See Comments)    unknown  . Penicillins Itching  . Sulfa Antibiotics Itching  . Tea Itching   CBC:    Component Value Date/Time   WBC 12.9* 08/12/2014 0356   HGB 13.4 08/12/2014 0356   HCT 44.5 08/12/2014 0356   PLT 362 08/12/2014 0356   MCV 100.7* 08/12/2014 0356   NEUTROABS 11.6* 08/12/2014 0356   LYMPHSABS 0.9 08/12/2014 0356   MONOABS 0.4 08/12/2014 0356   EOSABS 0.0 08/12/2014 0356   BASOSABS 0.0 08/12/2014 0356   Comprehensive Metabolic Panel:    Component Value Date/Time   NA 140 08/12/2014 0356   NA 134* 02/25/2014   K 4.6 08/12/2014 0356   CL 95* 08/12/2014 0356   CO2 33* 08/12/2014 0356   BUN 28* 08/12/2014 0356   BUN 20 02/25/2014   CREATININE 0.72 08/12/2014 0356   CREATININE 0.6 02/25/2014   CREATININE 0.80 02/22/2014 1214   GLUCOSE 178* 08/12/2014 0356   CALCIUM 8.5 08/12/2014 0356   AST 22 08/12/2014 0356   ALT 12 08/12/2014 0356   ALKPHOS 84 08/12/2014 0356   BILITOT 0.8 08/12/2014 0356   PROT 6.8 08/12/2014 0356   ALBUMIN 2.6* 08/12/2014 0356    Vital Signs: BP 124/56 mmHg  Pulse 86  Temp(Src) 97.6 F (36.4 C) (Axillary)  Resp 0  Ht 5\' 4"  (1.626 m)  Wt 86.2 kg (190 lb 0.6 oz)  BMI 32.60 kg/m2  SpO2 96% Filed Weights   08/12/14 0007  Weight: 86.2 kg (190 lb 0.6 oz)   02/28 0701 - 02/29 0700 In: 57 [IV Piggyback:50] Out: 400 [Urine:400]  Physical Exam:  General Appearance: Critically Ill HEENT: Bipap Lungs: decreased breath sounds CV: tachycardia 2/6 diastolic murmur heard  Abdomen:tense but not guarding  Extremities:+edema Skin:no lesions Psych: unable to assess  Summary of Established Goals of Care and Medical Treatment Preferences  Primary  Diagnoses  1. Sepsis, UTI 2. Respiratory failure 3. Encephalopathy  Active Symptoms: 1. Dyspnea 2. Altered Mental status-hypoactive delirium is possible  Psycho-social/Spiritual:  Two daughters, one at bedside.  Prognosis: Overall very poor prognosis in Sepsis given her baseline functional status    Palliative Performance Scale: 10%  Recommendations:  1. Code Status: DNR 2. Scope of Treatment:   1. Desires full scope medical treatment for the next 24 hours at least and then readdress goals of care. 2. I discussed the concept of a natural death and making  sure we are doing things to her that she would want given her baseline decline and condition-daughter is processing but not a lot of conversation or thoughts on this from her daughter-she tells me that her mother "isn't really chronically ill"- she required SNF due to several hip fractures that never improved. 3. Symptom Management: none needed for now- profoundly altered mental status, no non-verbal signs of suffering. 4. Palliative Prophylaxis: bowel regimen 5. Disposition: unclear at this point we will follow closely, if condition deteriorates or she does not improve I would recommend hospice care.   Time In: 9AM Time Out: 9:35AM Time Total: 35 minutes Greater than 50%  of this time was spent counseling and coordinating care related to the above assessment and plan.  Signed by: Roma Schanz, DO  08/12/2014, 12:43 PM  Please contact Palliative Medicine Team phone at [phone] for questions and concerns.

## 2014-08-12 NOTE — Progress Notes (Signed)
Pt currently on 31% VM and tolerating well at this time, pt is in no respiratory distress but is mildly tachypneic.  Pt is resting comfortably and is alet and following commands.  RT will hold BIPAP at this time, RT to monitor and assess throughout the night.

## 2014-08-12 NOTE — Progress Notes (Signed)
RT assessed PT for possible BiPAP removal. PT is lethargic and has minimal response to sternal rub. RN aware.

## 2014-08-12 NOTE — Care Management Note (Signed)
CARE MANAGEMENT NOTE 08/12/2014  Patient:  MARYJEAN, CORPENING   Account Number:  [id number]  Date Initiated:  08/12/2014  Documentation initiated by:  DAVIS,RHONDA  Subjective/Objective Assessment:   resp distress requiring bipap     Action/Plan:   home when stable   Anticipated DC Date:  08/15/2014   Anticipated DC Plan:  HOME/SELF CARE  In-house referral  NA      DC Planning Services  NA      [facility] Choice  NA   Choice offered to / List presented to:  NA   DME arranged  NA      DME agency  NA     [place] arranged  NA      [place] agency  NA   Status of service:  In process, will continue to follow Medicare Important Message given?   (If response is "NO", the following Medicare IM given date fields will be blank) Date Medicare IM given:   Medicare IM given by:   Date Additional Medicare IM given:   Additional Medicare IM given by:    Discharge Disposition:    Per UR Regulation:  Reviewed for med. necessity/level of care/duration of stay  If discussed at [place] of Stay Meetings, dates discussed:    Comments:  Feb. 29 2016/Rhonda L. Rosana Hoes, RN, BSN, CCM. Case Management [place] [phone] No discharge needs present of time of review.

## 2014-08-12 NOTE — Progress Notes (Signed)
[facility] PROGRESS NOTE  Jennifer Nielsen SWN:[id number] DOB: 05-09-1925 DOA: 08/11/2014  PCP: [name], MD  Brief HPI: 81 year old Caucasian female with a past medical history of atrial fibrillation, COPD, presents with progressive shortness of breath. She was found to have a UTI along with acute diastolic heart failure. She was admitted to the hospital for further management.  Past medical history:  Past Medical History  Diagnosis Date  . Arrhythmia   . Arthritis   . Atrial fibrillation     Remains stable (As of 05/09/13)  . H/O blood clots   . Hyperlipemia   . Dementia     Remains stable & continues to function adequately in the current living environment with supervision. Has had little changes in behavior (AS OF 05/09/13)  . GERD (gastroesophageal reflux disease)     Stable (As of 05/09/13)  . Osteoarthritis     Denies pain (As of 05/09/13)  . CHF (congestive heart failure)     chronic diastolic  . COPD (chronic obstructive pulmonary disease)     w/exacerbation    Consultants: None  Procedures: None  Antibiotics: Vancomycin and cefepime 2/28  Subjective: Patient lethargic on the BiPAP. Not a very easily arousable. No history available from her. But the daughters at the bedside.  Objective: Vital Signs  Filed Vitals:   08/12/14 0304 08/12/14 0400 08/12/14 0500 08/12/14 0602  BP: 112/58 119/50 123/71 126/61  Pulse: 89 84 176 87  Temp:  98 F (36.7 C)    TempSrc:  Axillary    Resp: 18 22 22 22   Height:      Weight:      SpO2: 97% 98% 97% 95%    Intake/Output Summary (Last 24 hours) at 08/12/14 0727 Last data filed at 08/12/14 0600  Gross per 24 hour  Intake     50 ml  Output    400 ml  Net   -350 ml   Filed Weights   08/12/14 0007  Weight: 86.2 kg (190 lb 0.6 oz)    General appearance: Lethargic. Not easily arousable. Resp: Decreased air entry at the bases without any wheezing. No definite crackles. Cardio: regular rate and rhythm, S1, S2  normal, no murmur, click, rub or gallop GI: soft, non-tender; bowel sounds normal; no masses,  no organomegaly Extremities: extremities normal, atraumatic, no cyanosis or edema Neurologic: Lethargic. Not arousable. Unable to fully assess neurological status.  Lab Results:  Basic Metabolic Panel:  Recent Labs Lab 08/11/14 1740 08/12/14 0356  NA 140 140  K 4.8 4.6  CL 95* 95*  CO2 34* 33*  GLUCOSE 151* 178*  BUN 26* 28*  CREATININE 0.77 0.72  CALCIUM 8.6 8.5   Liver Function Tests:  Recent Labs Lab 08/11/14 1740 08/12/14 0356  AST 23 22  ALT 15 12  ALKPHOS 89 84  BILITOT 0.8 0.8  PROT 7.0 6.8  ALBUMIN 2.8* 2.6*    Recent Labs Lab 08/11/14 2251  AMMONIA 22   CBC:  Recent Labs Lab 08/11/14 1740 08/12/14 0356  WBC 19.7* 12.9*  NEUTROABS 16.2* 11.6*  HGB 14.3 13.4  HCT 45.9 44.5  MCV 99.6 100.7*  PLT 413* 362   BNP (last 3 results)  Recent Labs  08/11/14 1740  BNP 25.7     Recent Results (from the past 240 hour(s))  MRSA PCR Screening     Status: None   Collection Time: 08/12/14 12:04 AM  Result Value Ref Range Status   MRSA by PCR NEGATIVE NEGATIVE Final  Comment:        The GeneXpert MRSA Assay (FDA approved for NASAL specimens only), is one component of a comprehensive MRSA colonization surveillance program. It is not intended to diagnose MRSA infection nor to guide or monitor treatment for MRSA infections.       Studies/Results: Ct Head Wo Contrast  08/12/2014   CLINICAL DATA:  Acute encephalopathy.  History of dementia.  EXAM: CT HEAD WITHOUT CONTRAST  TECHNIQUE: Contiguous axial images were obtained from the base of the skull through the vertex without intravenous contrast.  COMPARISON:  04/28/2010  FINDINGS: Skull and Sinuses:Negative for fracture or destructive process. There are small nonobstructive osteomas within the left sphenoid and right ethmoid sinuses.  Orbits: No acute abnormality.  Brain: No evidence of acute  infarction, hemorrhage, hydrocephalus, or mass lesion/mass effect. Generalized brain atrophy which is fairly typical for age, but new from 2011. Small vessel ischemic change which is mild and stable -gliosis best visualized around the frontal horns of the lateral ventricles.  IMPRESSION: 1. No acute intracranial disease. 2. Brain atrophy which has developed since 2011.   Electronically Signed   By: [name] M.D.   On: 08/12/2014 00:05   Dg Chest Portable 1 View  08/11/2014   CLINICAL DATA:  Shortness of breath, weakness, lethargy  EXAM: PORTABLE CHEST - 1 VIEW  COMPARISON:  02/22/2014  FINDINGS: Cardiomegaly with pulmonary vascular congestion and possible mild interstitial edema. Suspected layering left pleural effusion. No pneumothorax.  IMPRESSION: Cardiomegaly with possible mild interstitial edema.  Suspected layering left pleural effusion.   Electronically Signed   By: [name] M.D.   On: 08/11/2014 18:26    Medications:  Scheduled: . antiseptic oral rinse  7 mL Mouth Rinse q12n4p  . budesonide-formoterol  2 puff Inhalation BID  . ceFEPime (MAXIPIME) IV  1 g Intravenous Q12H  . chlorhexidine  15 mL Mouth Rinse BID  . diltiazem  120 mg Oral Daily  . divalproex  125 mg Oral BID  . furosemide  60 mg Intravenous Daily  . methylPREDNISolone (SOLU-MEDROL) injection  60 mg Intravenous Q12H  . rivaroxaban  20 mg Oral Q24H  . sodium chloride  3 mL Intravenous Q12H  . vancomycin  750 mg Intravenous Q12H   Continuous:  TKP:[id number] **OR** acetaminophen, ondansetron **OR** ondansetron (ZOFRAN) IV  Assessment/Plan:  Principal Problem:   Sepsis Active Problems:   Long term current use of anticoagulant therapy   Atrial fibrillation   Dementia   Acute on chronic diastolic heart failure   Acute respiratory failure with hypoxia and hypercarbia   UTI (lower urinary tract infection)   Acute encephalopathy   Wheelchair dependent     UTI with Sepsis Continue broad-spectrum  antibiotics for now. There was also concern about possible respiratory infection as well. She was apparently hypoxic in the emergency department. Await urine cultures. Blood pressure stable. Lactic acid is normal.   Acute encephalopathy CT head showed brain atrophy but nothing else that was acute. Her altered mental status is most likely secondary to acute infection. Continue to monitor closely for now. Hypercapnia on ABG is most likely due to her history of COPD. Her pH is normal.  Acute on chronic diastolic CHF BiPAP as needed. Continue with intravenous Lasix. Strict ins and outs. Echocardiogram from January 2015 showed normal EF. No need to repeat  Acute respiratory failure Most likely secondary to the above. Continue to monitor closely. Continue steroids for now. Continue Lasix.   History of COPD As above. Continue  inhalers  History of atrial fibrillation Continue xarelto. Continue diltiazem. Monitor on telemetry.  Questionable history of dementia Daughter did not admit to the same. Previous notes have suggested this condition. She is on Depakote. Levels were nontoxic.   DVT Prophylaxis: On Xarelto     Code Status: DO NOT RESUSCITATE  Family Communication: Discussed with daughters Disposition Plan: Not ready for discharge. Will remain in stepdown.    LOS: 1 day   [place] [facility] Pager [phone] 08/12/2014, 7:27 AM  If 7PM-7AM, please contact night-coverage at [email or web address], password [facility]

## 2014-08-13 ENCOUNTER — Inpatient Hospital Stay (HOSPITAL_COMMUNITY): Payer: Medicare Other

## 2014-08-13 LAB — CBC
HCT: 42.6 % (ref 36.0–46.0)
HEMOGLOBIN: 13.1 g/dL (ref 12.0–15.0)
MCH: 30.6 pg (ref 26.0–34.0)
MCHC: 30.8 g/dL (ref 30.0–36.0)
MCV: 99.5 fL (ref 78.0–100.0)
PLATELETS: 405 10*3/uL — AB (ref 150–400)
RBC: 4.28 MIL/uL (ref 3.87–5.11)
RDW: 15.9 % — ABNORMAL HIGH (ref 11.5–15.5)
WBC: 10.6 10*3/uL — ABNORMAL HIGH (ref 4.0–10.5)

## 2014-08-13 LAB — BASIC METABOLIC PANEL
ANION GAP: 9 (ref 5–15)
BUN: 42 mg/dL — ABNORMAL HIGH (ref 6–23)
CHLORIDE: 96 mmol/L (ref 96–112)
CO2: 35 mmol/L — ABNORMAL HIGH (ref 19–32)
Calcium: 8.4 mg/dL (ref 8.4–10.5)
Creatinine, Ser: 0.58 mg/dL (ref 0.50–1.10)
GFR calc non Af Amer: 80 mL/min — ABNORMAL LOW (ref >90)
Glucose, Bld: 164 mg/dL — ABNORMAL HIGH (ref 70–99)
Potassium: 4 mmol/L (ref 3.5–5.1)
SODIUM: 140 mmol/L (ref 135–145)

## 2014-08-13 MED ORDER — FUROSEMIDE 10 MG/ML IJ SOLN
40.0000 mg | Freq: Every day | INTRAMUSCULAR | Status: DC
  Administered 2014-08-14: 40 mg via INTRAVENOUS
  Filled 2014-08-13: qty 4

## 2014-08-13 NOTE — Progress Notes (Signed)
CSW received referral that pt admitted from [facility].   CSW reviewed chart and noted that pt currently on venti mask, total care, and palliative medicine MD involved for goals of care.   CSW to complete full psychosocial assessment when further goals are clarified and disposition planning appropriate.  CSW to continue to follow.  Alison Murray, MSW, [place] [employer] [phone]

## 2014-08-13 NOTE — Clinical Documentation Improvement (Signed)
HPI: Noted pt. at her baseline dependent for most of her ADL.Still remain bedbound and wheelchair-bound. At her baseline she is incontinent of bowel and urine. OT: Evaluation "Spoke with PT who reports pt is total care with ADL so therefore no acute OT needs"   Please clarify if patient could meet any specific condition.  Possible Conditions _______________Functional Quadriplegia  Other Not able to determine  Risk Factors:   Dementia, lives in ALF, COPD, Arthritis, Acute respiratory failure   Thank you,  Philippa Chester ,RN Clinical Documentation Specialist:  [place] [employer]

## 2014-08-13 NOTE — Evaluation (Signed)
Physical Therapy Evaluation Patient Details Name: Anistyn Graddy MRN: [id number] DOB: 1924-07-20 Today's Date: 08/13/2014   History of Present Illness  Jennifer Nielsen is a 81 y.o. female with Past medical history of atrial fibrillation, on Xarelto, GERD, chronic diastolic CHF, dementia, COPD not on oxygen, bedbound. Admitted 08/11/14 with SOB, decreased rsponsiveness,  hypoxia, CHF, sepsis., UTI. Patient is long term resident of [facility], bed/WC bound after  multiple hip fractures. initially required BiPAP, NRB,.  Clinical Impression  Patient  Tolerated sitting at the edge of the bed x  ~ 10 minutes with assist. Sats 87-100% on Partial NRB. RR 22-40,  Patient will benefit from PT while in acute care to address problems listed in note below. Chart indicates that patient was total care PTA.     Follow Up Recommendations No PT follow up;SNF (? if patient qualifies for skilled PT at SNF if required total assist/lift for  transfers PTA, SNF will need to assess. )    Equipment Recommendations  None recommended by PT    Recommendations for Other Services       Precautions / Restrictions Precautions Precautions: Fall Precaution Comments: monitor VS      Mobility  Bed Mobility Overal bed mobility: Needs Assistance;+2 for physical assistance;+ 2 for safety/equipment Bed Mobility: Supine to Sit;Sit to Supine     Supine to sit: Total assist;HOB elevated Sit to supine: Total assist;+2 for physical assistance;+2 for safety/equipment   General bed mobility comments: slide sheet and bed pad used to move to edge of bed,  then assisted with trunk and legs  back into bed.  Transfers                 General transfer comment: requires mechanical lift, did not  test  Ambulation/Gait                Stairs            Wheelchair Mobility    Modified Rankin (Stroke Patients Only)       Balance Overall balance assessment: Needs assistance;History of Falls Sitting-balance  support: Feet supported;Bilateral upper extremity supported Sitting balance-Leahy Scale: Poor Sitting balance - Comments: intermittently able to support self with cues to lean forward. sat x 10 minutes. Postural control: Posterior lean                                   Pertinent Vitals/Pain Pain Assessment: No/denies pain    Home Living Family/patient expects to be discharged to:: Skilled nursing facility                      Prior Function Level of Independence: Needs assistance   Gait / Transfers Assistance Needed: nonambulatory     Comments: patient states  2 person assist OOB, [place], no family present to confirm     Hand Dominance        Extremity/Trunk Assessment   Upper Extremity Assessment: RUE deficits/detail;Generalized weakness RUE Deficits / Details: decreased shoulder elevation         Lower Extremity Assessment: RLE deficits/detail;LLE deficits/detail RLE Deficits / Details: grossly 2+ , did not attempt standing       Communication      Cognition Arousal/Alertness: Awake/alert Behavior During Therapy: WFL for tasks assessed/performed Overall Cognitive Status: History of cognitive impairments - at baseline Area of Impairment: Orientation Orientation Level: Time  General Comments: stated she was in the hospital.    General Comments      Exercises        Assessment/Plan    PT Assessment Patient needs continued PT services  PT Diagnosis Generalized weakness   PT Problem List Decreased strength;Decreased activity tolerance;Decreased mobility;Decreased balance;Cardiopulmonary status limiting activity;Decreased knowledge of precautions  PT Treatment Interventions Functional mobility training;Therapeutic activities;Balance training;Therapeutic exercise;Patient/family education   PT Goals (Current goals can be found in the Care Plan section) Acute Rehab PT Goals PT Goal Formulation: Patient unable to  participate in goal setting Time For Goal Achievement: 08/27/14 Potential to Achieve Goals: Fair    Frequency Min 2X/week   Barriers to discharge        Co-evaluation               End of Session Equipment Utilized During Treatment: Oxygen (partial NRB) Activity Tolerance: Patient tolerated treatment well Patient left: in bed;with call bell/phone within reach;with bed alarm set Nurse Communication: Mobility status;Need for lift equipment  Bed pad soaked with urine/has Foley.         Time: 6063-0160 PT Time Calculation (min) (ACUTE ONLY): 38 min   Charges:   PT Evaluation $Initial PT Evaluation Tier I: 1 Procedure PT Treatments $Therapeutic Activity: 23-37 mins   PT G Codes:        Claretha Cooper 08/13/2014, 9:12 AM Tresa Endo PT [phone]

## 2014-08-13 NOTE — Progress Notes (Signed)
[facility] PROGRESS NOTE  Jennifer Nielsen ZHG:[id number] DOB: Sep 18, 1924 DOA: 08/11/2014  PCP: [name], MD  Brief HPI: 81 year old Caucasian female with a past medical history of atrial fibrillation, COPD, presents with progressive shortness of breath. She was found to have a UTI along with acute diastolic heart failure. She was admitted to the hospital for further management.  Past medical history:  Past Medical History  Diagnosis Date  . Arrhythmia   . Arthritis   . Atrial fibrillation     Remains stable (As of 05/09/13)  . H/O blood clots   . Hyperlipemia   . Dementia     Remains stable & continues to function adequately in the current living environment with supervision. Has had little changes in behavior (AS OF 05/09/13)  . GERD (gastroesophageal reflux disease)     Stable (As of 05/09/13)  . Osteoarthritis     Denies pain (As of 05/09/13)  . CHF (congestive heart failure)     chronic diastolic  . COPD (chronic obstructive pulmonary disease)     w/exacerbation    Consultants: None  Procedures: None  Antibiotics: Vancomycin and cefepime 2/28  Subjective: Patient more awake and alert today. She is now on a Ventimask. Did not require BiPAP overnight.   Objective: Vital Signs  Filed Vitals:   08/13/14 0200 08/13/14 0400 08/13/14 0401 08/13/14 0600  BP: 119/42 102/39  108/54  Pulse: 62 33  63  Temp:   97.1 F (36.2 C)   TempSrc:   Axillary   Resp: 32 33  34  Height:      Weight:   87.4 kg (192 lb 10.9 oz)   SpO2: 96% 100%  96%    Intake/Output Summary (Last 24 hours) at 08/13/14 0731 Last data filed at 08/13/14 0600  Gross per 24 hour  Intake    400 ml  Output    815 ml  Net   -415 ml   Filed Weights   08/12/14 0007 08/13/14 0401  Weight: 86.2 kg (190 lb 0.6 oz) 87.4 kg (192 lb 10.9 oz)    General appearance: More arousable today. Was able to answer questions. Resp: Decreased air entry at the bases without any wheezing. No definite  crackles. Cardio: regular rate and rhythm, S1, S2 normal, no murmur, click, rub or gallop GI: soft, non-tender; bowel sounds normal; no masses,  no organomegaly Extremities: extremities normal, atraumatic, no cyanosis or edema Neurologic: Awake and alert. Moving all extremities.  Lab Results:  Basic Metabolic Panel:  Recent Labs Lab 08/11/14 1740 08/12/14 0356 08/13/14 0358  NA 140 140 140  K 4.8 4.6 4.0  CL 95* 95* 96  CO2 34* 33* 35*  GLUCOSE 151* 178* 164*  BUN 26* 28* 42*  CREATININE 0.77 0.72 0.58  CALCIUM 8.6 8.5 8.4   Liver Function Tests:  Recent Labs Lab 08/11/14 1740 08/12/14 0356  AST 23 22  ALT 15 12  ALKPHOS 89 84  BILITOT 0.8 0.8  PROT 7.0 6.8  ALBUMIN 2.8* 2.6*    Recent Labs Lab 08/11/14 2251  AMMONIA 22   CBC:  Recent Labs Lab 08/11/14 1740 08/12/14 0356 08/13/14 0358  WBC 19.7* 12.9* 10.6*  NEUTROABS 16.2* 11.6*  --   HGB 14.3 13.4 13.1  HCT 45.9 44.5 42.6  MCV 99.6 100.7* 99.5  PLT 413* 362 405*   BNP (last 3 results)  Recent Labs  08/11/14 1740  BNP 25.7     Recent Results (from the past 240 hour(s))  MRSA  PCR Screening     Status: None   Collection Time: 08/12/14 12:04 AM  Result Value Ref Range Status   MRSA by PCR NEGATIVE NEGATIVE Final    Comment:        The GeneXpert MRSA Assay (FDA approved for NASAL specimens only), is one component of a comprehensive MRSA colonization surveillance program. It is not intended to diagnose MRSA infection nor to guide or monitor treatment for MRSA infections.       Studies/Results: Ct Head Wo Contrast  08/12/2014   CLINICAL DATA:  Acute encephalopathy.  History of dementia.  EXAM: CT HEAD WITHOUT CONTRAST  TECHNIQUE: Contiguous axial images were obtained from the base of the skull through the vertex without intravenous contrast.  COMPARISON:  04/28/2010  FINDINGS: Skull and Sinuses:Negative for fracture or destructive process. There are small nonobstructive osteomas within  the left sphenoid and right ethmoid sinuses.  Orbits: No acute abnormality.  Brain: No evidence of acute infarction, hemorrhage, hydrocephalus, or mass lesion/mass effect. Generalized brain atrophy which is fairly typical for age, but new from 2011. Small vessel ischemic change which is mild and stable -gliosis best visualized around the frontal horns of the lateral ventricles.  IMPRESSION: 1. No acute intracranial disease. 2. Brain atrophy which has developed since 2011.   Electronically Signed   By: [name] M.D.   On: 08/12/2014 00:05   Dg Chest Portable 1 View  08/11/2014   CLINICAL DATA:  Shortness of breath, weakness, lethargy  EXAM: PORTABLE CHEST - 1 VIEW  COMPARISON:  02/22/2014  FINDINGS: Cardiomegaly with pulmonary vascular congestion and possible mild interstitial edema. Suspected layering left pleural effusion. No pneumothorax.  IMPRESSION: Cardiomegaly with possible mild interstitial edema.  Suspected layering left pleural effusion.   Electronically Signed   By: [name] M.D.   On: 08/11/2014 18:26    Medications:  Scheduled: . antiseptic oral rinse  7 mL Mouth Rinse q12n4p  . budesonide-formoterol  2 puff Inhalation BID  . ceFEPime (MAXIPIME) IV  1 g Intravenous Q12H  . chlorhexidine  15 mL Mouth Rinse BID  . diltiazem  120 mg Oral Daily  . divalproex  125 mg Oral BID  . furosemide  60 mg Intravenous Daily  . methylPREDNISolone (SOLU-MEDROL) injection  60 mg Intravenous Q12H  . rivaroxaban  20 mg Oral Q24H  . sodium chloride  3 mL Intravenous Q12H  . vancomycin  750 mg Intravenous Q12H   Continuous:  WIO:[id number] **OR** acetaminophen, ondansetron **OR** ondansetron (ZOFRAN) IV  Assessment/Plan:  Principal Problem:   Sepsis Active Problems:   Long term current use of anticoagulant therapy   Atrial fibrillation   Dementia   Acute on chronic diastolic heart failure   Acute respiratory failure with hypoxia and hypercarbia   UTI (lower urinary tract  infection)   Acute encephalopathy   Wheelchair dependent     UTI with Sepsis Continue broad-spectrum antibiotics for now. There was also concern about possible respiratory infection as well. She was apparently hypoxic in the emergency department. It appears that a urine culture was not sent at the time of admission. We will order one for today, although she has received antibiotics. Blood cultures are pending. Blood pressure stable. Lactic acid is normal. WBC is better. Narrow antibiotic spectrum once culture data is available.  Acute encephalopathy Mental status improved today. CT head showed brain atrophy but nothing else that was acute. Her altered mental status is most likely secondary to acute infection. Hypercapnia on ABG is most likely  due to her history of COPD and is chronic. Her pH is normal.  Acute on chronic diastolic CHF Has not required BiPAP overnight. Continue with intravenous Lasix. Strict ins and outs. Echocardiogram from January 2015 showed normal EF. No need to repeat.  Acute respiratory failure with hypoxia Most likely secondary to the above. Continue to monitor closely. Continue steroids for now. Continue Lasix. Repeat chest x-ray.  History of COPD As above. Continue inhalers  History of atrial fibrillation Continue xarelto. Continue diltiazem. Monitor on telemetry. Heart rate is reasonably well controlled.  Questionable history of dementia Daughter did not admit to the same. Previous notes have suggested this condition. She is on Depakote. Levels were nontoxic. She has generalized weakness. PT and OT to follow.  Palliative medicine is also following.  DVT Prophylaxis: On Xarelto     Code Status: DO NOT RESUSCITATE  Family Communication: Discussed with daughters Disposition Plan: Transfer to floor once oxygen requirements have decreased some more.    LOS: 2 days   [place] [facility] Pager [phone] 08/13/2014, 7:31 AM  If 7PM-7AM, please  contact night-coverage at [email or web address], password [facility]

## 2014-08-13 NOTE — Progress Notes (Signed)
OT Cancellation Note  Patient Details Name: Jennifer Nielsen MRN: [id number] DOB: 12/19/1924   Cancelled Treatment:    Reason Eval/Treat Not Completed: Other (comment) Spoke with PT who reports pt is total care with ADL so therefore no acute OT needs. Will sign off.  [place], [place] 08/13/2014, 9:00 AM

## 2014-08-13 NOTE — Progress Notes (Signed)
Pt is currently on 45% Venturi mask and shows no signs of increase WOB but is mildly tachypneic. Pt states she is comfortable and does not need Bipap at this time. RT will continue to monitor.

## 2014-08-14 LAB — BASIC METABOLIC PANEL
ANION GAP: 9 (ref 5–15)
BUN: 42 mg/dL — ABNORMAL HIGH (ref 6–23)
CHLORIDE: 94 mmol/L — AB (ref 96–112)
CO2: 36 mmol/L — AB (ref 19–32)
Calcium: 8.3 mg/dL — ABNORMAL LOW (ref 8.4–10.5)
Creatinine, Ser: 0.52 mg/dL (ref 0.50–1.10)
GFR calc Af Amer: >90 mL/min (ref >90)
GFR calc non Af Amer: 83 mL/min — ABNORMAL LOW (ref >90)
Glucose, Bld: 140 mg/dL — ABNORMAL HIGH (ref 70–99)
Potassium: 4.4 mmol/L (ref 3.5–5.1)
Sodium: 139 mmol/L (ref 135–145)

## 2014-08-14 LAB — CBC
HEMATOCRIT: 42.3 % (ref 36.0–46.0)
Hemoglobin: 13.1 g/dL (ref 12.0–15.0)
MCH: 30.5 pg (ref 26.0–34.0)
MCHC: 31 g/dL (ref 30.0–36.0)
MCV: 98.4 fL (ref 78.0–100.0)
PLATELETS: 383 10*3/uL (ref 150–400)
RBC: 4.3 MIL/uL (ref 3.87–5.11)
RDW: 15.5 % (ref 11.5–15.5)
WBC: 8.4 10*3/uL (ref 4.0–10.5)

## 2014-08-14 LAB — URINE CULTURE
Colony Count: NO GROWTH
Culture: NO GROWTH

## 2014-08-14 MED ORDER — FUROSEMIDE 10 MG/ML IJ SOLN
40.0000 mg | Freq: Two times a day (BID) | INTRAMUSCULAR | Status: DC
  Administered 2014-08-14 – 2014-08-16 (×4): 40 mg via INTRAVENOUS
  Filled 2014-08-14 (×4): qty 4

## 2014-08-14 NOTE — Progress Notes (Signed)
Clinical Social Work Department BRIEF PSYCHOSOCIAL ASSESSMENT 08/14/2014  Patient:  Jennifer Nielsen, Jennifer Nielsen     Account Number:  [id number]     Admit date:  08/11/2014  Clinical Social Worker:  Maryln Manuel  Date/Time:  08/14/2014 01:00 PM  Referred by:  Physician  Date Referred:  08/14/2014 Referred for  SNF Placement   Other Referral:   Interview type:  Patient Other interview type:   and patient daughter, Jennifer Nielsen    PSYCHOSOCIAL DATA Living Status:  FACILITY Admitted from facility:  [facility] Level of care:  [place] Primary support name:  Jennifer Nielsen/daughter/[phone] Primary support relationship to patient:  CHILD, ADULT Degree of support available:   strong    CURRENT CONCERNS Current Concerns  Post-Acute Placement   Other Concerns:    SOCIAL WORK ASSESSMENT / PLAN CSW received referral that pt admitted from [facility].    CSW met with pt and pt daughter, Jennifer Half at bedside. Pt was on a venturi mask this morning, but now on 6L O2. CSW introduced self and explained role. Pt daughter confirmed that pt is a resident at [facility]. Pt daughter shared that pt is a long term resident at [facility] and has been at facility for 3 to 4 years. CSW provided support to pt daughter as pt daughter discussed that she feels that pt is slowly progressing. Pt daughter shared that pt has been doing better with her oxygen levels during the day, but has still been having difficulties with her oxygen levels at night. Pt daughter shared that [facility] has taken good care of pt and pt daughter is agreeable to providing [facility] with updates.    CSW completed FL2 and sent clinicals to [facility]. CSW spoke with [facility] who stated that pt is a long term private pay resident at the facility. Per [facility], pt was essentially total care prior to admission, but was able to feed herself.    CSW to continue to follow to provide support and assist with  disposition needs as appropriate.   Assessment/plan status:  Psychosocial Support/Ongoing Assessment of Needs Other assessment/ plan:   discharge planning   Information/referral to community resources:   Referral back to [place]: Pt alert and oriented x 2. Pt daughter supportive and actively involved in pt care. Pt and pt family has been satisfied with pt care at [facility]. Pt daughter anxious to see how pt progresses surrounding her oxygen needs. Support provided.   Alison Murray, MSW, [place] Work [phone]

## 2014-08-14 NOTE — Progress Notes (Signed)
PROGRESS NOTE  Jennifer Nielsen HWE:[id number] DOB: 1924/07/01 DOA: 08/11/2014 PCP: [name], MD  Assessment/Plan: UTI with Sepsis Continue broad-spectrum antibiotics for now. There was also concern about possible respiratory infection as well. She was apparently hypoxic in the emergency department. It appears that a urine culture was not sent at the time of admission. -as expected urine culture is neg Blood cultures are neg. -Blood pressure stable. Lactic acid is normal. WBC is better.  -d/c vanco, continue cefepime  Acute encephalopathy Mental status improved. CT head showed brain atrophy but nothing else that was acute.  Her altered mental status is most likely secondary to acute infection and hypercapnea.  Hypercapnia on ABG is most likely due to her history of COPD and is chronic. Her pH is normal.  Acute on chronic diastolic CHF Has not required BiPAP overnight. Continue with intravenous Lasix. Strict ins and outs. Echocardiogram from January 2015 showed normal EF. No need to repeat. -neg 1.1L -question accuracy of weights -continue IV lasix-->increase to bid -pt takes lasix 40mg  po bid at home -repeat echo  Acute respiratory failure with hypoxia Most likely secondary to the above. Continue to monitor closely. Continue steroids for now. Continue Lasix. -weaned to 4L during the day  History of COPD As above. Continue inhalers D/c solumedrol--no wheezing  History of atrial fibrillation Continue xarelto. Continue diltiazem. Monitor on telemetry. Heart rate is reasonably well controlled.  Questionable history of dementia Daughter did not admit to the same. Previous notes have suggested this condition. She is on Depakote. Levels were nontoxic. She has generalized weakness. PT and OT to follow.  Palliative medicine is also following.  DVT Prophylaxis: On Xarelto  Code Status: DO NOT RESUSCITATE  Family Communication: Discussed with daughters Disposition Plan:  Transfer to floor once oxygen requirements have decreased some more.      Procedures/Studies: Ct Head Wo Contrast  08/12/2014   CLINICAL DATA:  Acute encephalopathy.  History of dementia.  EXAM: CT HEAD WITHOUT CONTRAST  TECHNIQUE: Contiguous axial images were obtained from the base of the skull through the vertex without intravenous contrast.  COMPARISON:  04/28/2010  FINDINGS: Skull and Sinuses:Negative for fracture or destructive process. There are small nonobstructive osteomas within the left sphenoid and right ethmoid sinuses.  Orbits: No acute abnormality.  Brain: No evidence of acute infarction, hemorrhage, hydrocephalus, or mass lesion/mass effect. Generalized brain atrophy which is fairly typical for age, but new from 2011. Small vessel ischemic change which is mild and stable -gliosis best visualized around the frontal horns of the lateral ventricles.  IMPRESSION: 1. No acute intracranial disease. 2. Brain atrophy which has developed since 2011.   Electronically Signed   By: [name] M.D.   On: 08/12/2014 00:05   Dg Chest Port 1 View  08/13/2014   CLINICAL DATA:  Hypoxia, dementia, CHF, COPD, atrial fibrillation, history blood clots  EXAM: PORTABLE CHEST - 1 VIEW  COMPARISON:  Portable exam 0747 hours compared to 08/11/2014  FINDINGS: Rotated to the RIGHT.  Enlargement of cardiac silhouette with pulmonary vascular congestion.  Bibasilar effusions and atelectasis greater on LEFT.  Minimal central peribronchial thickening and question mild perihilar edema, probably little changed when accounting for differences in technique.  No pneumothorax.  Bones demineralized with RIGHT glenohumeral degenerative changes.  IMPRESSION: Enlargement of cardiac silhouette with pulmonary vascular congestion and question mild pulmonary edema, little changed.  Bibasilar effusions and atelectasis LEFT greater than RIGHT.   Electronically Signed  By: [name] M.D.   On: 08/13/2014 08:04   Dg Chest Portable 1  View  08/11/2014   CLINICAL DATA:  Shortness of breath, weakness, lethargy  EXAM: PORTABLE CHEST - 1 VIEW  COMPARISON:  02/22/2014  FINDINGS: Cardiomegaly with pulmonary vascular congestion and possible mild interstitial edema. Suspected layering left pleural effusion. No pneumothorax.  IMPRESSION: Cardiomegaly with possible mild interstitial edema.  Suspected layering left pleural effusion.   Electronically Signed   By: [name] M.D.   On: 08/11/2014 18:26         Subjective: Patient is breathing better. She denies any fevers, chills, chest pain, nausea, vomiting, diarrhea, abdominal pain. No coughing or hemoptysis.  Objective: Filed Vitals:   08/14/14 1200 08/14/14 1400 08/14/14 1600 08/14/14 1632  BP: 139/66 116/42 128/49   Pulse: 71 71 68   Temp:      TempSrc:      Resp: 20 24 22    Height:      Weight:      SpO2: 97% 98% 99% 96%    Intake/Output Summary (Last 24 hours) at 08/14/14 1857 Last data filed at 08/14/14 1600  Gross per 24 hour  Intake    400 ml  Output   1175 ml  Net   -775 ml   Weight change: 0.5 kg (1 lb 1.6 oz) Exam:   General:  Pt is alert, follows commands appropriately, not in acute distress  HEENT: No icterus, No thrush, [place]/AT  Cardiovascular: RRR, S1/S2, no rubs, no gallops  Respiratory: Bibasilar crackles. No wheezing. Good air movement.  Abdomen: Soft/+BS, non tender, non distended, no guarding  Extremities: 1+LE edema, No lymphangitis, No petechiae, No rashes, no synovitis  Data Reviewed: Basic Metabolic Panel:  Recent Labs Lab 08/11/14 1740 08/12/14 0356 08/13/14 0358 08/14/14 0400  NA 140 140 140 139  K 4.8 4.6 4.0 4.4  CL 95* 95* 96 94*  CO2 34* 33* 35* 36*  GLUCOSE 151* 178* 164* 140*  BUN 26* 28* 42* 42*  CREATININE 0.77 0.72 0.58 0.52  CALCIUM 8.6 8.5 8.4 8.3*   Liver Function Tests:  Recent Labs Lab 08/11/14 1740 08/12/14 0356  AST 23 22  ALT 15 12  ALKPHOS 89 84  BILITOT 0.8 0.8  PROT 7.0 6.8    ALBUMIN 2.8* 2.6*   No results for input(s): LIPASE, AMYLASE in the last 168 hours.  Recent Labs Lab 08/11/14 2251  AMMONIA 22   CBC:  Recent Labs Lab 08/11/14 1740 08/12/14 0356 08/13/14 0358 08/14/14 0400  WBC 19.7* 12.9* 10.6* 8.4  NEUTROABS 16.2* 11.6*  --   --   HGB 14.3 13.4 13.1 13.1  HCT 45.9 44.5 42.6 42.3  MCV 99.6 100.7* 99.5 98.4  PLT 413* 362 405* 383   Cardiac Enzymes: No results for input(s): CKTOTAL, CKMB, CKMBINDEX, TROPONINI in the last 168 hours. BNP: Invalid input(s): POCBNP CBG: No results for input(s): GLUCAP in the last 168 hours.  Recent Results (from the past 240 hour(s))  Culture, blood (routine x 2)     Status: None (Preliminary result)   Collection Time: 08/11/14 10:45 PM  Result Value Ref Range Status   Specimen Description BLOOD L ARM  Final   Special Requests BOTTLES DRAWN AEROBIC AND ANAEROBIC 5ML  Final   Culture   Final           BLOOD CULTURE RECEIVED NO GROWTH TO DATE CULTURE WILL BE HELD FOR 5 DAYS BEFORE ISSUING A FINAL NEGATIVE REPORT Performed at  [facility]    Report Status PENDING  Incomplete  Culture, blood (routine x 2)     Status: None (Preliminary result)   Collection Time: 08/11/14 10:49 PM  Result Value Ref Range Status   Specimen Description BLOOD LEFT ANTECUBITAL  Final   Special Requests BOTTLES DRAWN AEROBIC AND ANAEROBIC 4CC  Final   Culture   Final           BLOOD CULTURE RECEIVED NO GROWTH TO DATE CULTURE WILL BE HELD FOR 5 DAYS BEFORE ISSUING A FINAL NEGATIVE REPORT Performed at [facility]    Report Status PENDING  Incomplete  MRSA PCR Screening     Status: None   Collection Time: 08/12/14 12:04 AM  Result Value Ref Range Status   MRSA by PCR NEGATIVE NEGATIVE Final    Comment:        The GeneXpert MRSA Assay (FDA approved for NASAL specimens only), is one component of a comprehensive MRSA colonization surveillance program. It is not intended to diagnose MRSA infection nor to  guide or monitor treatment for MRSA infections.   Culture, Urine     Status: None   Collection Time: 08/13/14 10:43 AM  Result Value Ref Range Status   Specimen Description URINE, CATHETERIZED  Final   Special Requests NONE  Final   Colony Count NO GROWTH Performed at [facility]   Final   Culture NO GROWTH Performed at [facility]   Final   Report Status 08/14/2014 FINAL  Final     Scheduled Meds: . antiseptic oral rinse  7 mL Mouth Rinse q12n4p  . budesonide-formoterol  2 puff Inhalation BID  . ceFEPime (MAXIPIME) IV  1 g Intravenous Q12H  . chlorhexidine  15 mL Mouth Rinse BID  . diltiazem  120 mg Oral Daily  . divalproex  125 mg Oral BID  . furosemide  40 mg Intravenous Daily  . methylPREDNISolone (SOLU-MEDROL) injection  60 mg Intravenous Q12H  . rivaroxaban  20 mg Oral Q24H  . sodium chloride  3 mL Intravenous Q12H  . vancomycin  750 mg Intravenous Q12H   Continuous Infusions:    Julyanna Scholle, DO  [facility] Pager [phone]  If 7PM-7AM, please contact night-coverage [email or web address] Password [facility] 08/14/2014, 6:57 PM   LOS: 3 days

## 2014-08-14 NOTE — Progress Notes (Signed)
Pt shows no signs of increase wob or respiratory distress at this time. Pt is resting comfortably on 4Lpm nasal cannula SpO2 94%. Bipap not needed at this time. RT will continue to monitor.

## 2014-08-14 NOTE — Progress Notes (Signed)
[place] for Cefepime/Vancomycin Indication: Sepsis  Allergies  Allergen Reactions  . Chocolate Itching  . Ciprofloxacin Itching  . Coffee Bean Extract [Coffea Arabica] Itching  . Coffee Flavor Itching  . Gentamycin [Gentamicin] Itching  . Imodium [Loperamide] Other (See Comments)    unknown  . Penicillins Itching  . Sulfa Antibiotics Itching  . Tea Itching    Patient Measurements: Height: 5\' 4"  (162.6 cm) Weight: 193 lb 12.6 oz (87.9 kg) IBW/kg (Calculated) : 54.7 Wt=92 kg   Vital Signs: Temp: 97.4 F (36.3 C) (03/02 1147) Temp Source: Axillary (03/02 1147) BP: 123/66 mmHg (03/02 1000) Pulse Rate: 64 (03/02 0800) Intake/Output from previous day: 03/01 0701 - 03/02 0700 In: 1120 [P.O.:720; IV Piggyback:400] Out: 1295 [Urine:1295] Intake/Output from this shift: Total I/O In: 50 [IV Piggyback:50] Out: 200 [Urine:200]  Labs:  Recent Labs  08/12/14 0356 08/13/14 0358 08/14/14 0400  WBC 12.9* 10.6* 8.4  HGB 13.4 13.1 13.1  PLT 362 405* 383  CREATININE 0.72 0.58 0.52   Estimated Creatinine Clearance: 52.2 mL/min (by C-G formula based on Cr of 0.52). No results for input(s): VANCOTROUGH, VANCOPEAK, VANCORANDOM, GENTTROUGH, GENTPEAK, GENTRANDOM, TOBRATROUGH, TOBRAPEAK, TOBRARND, AMIKACINPEAK, AMIKACINTROU, AMIKACIN in the last 72 hours.   Microbiology: Recent Results (from the past 720 hour(s))  Culture, blood (routine x 2)     Status: None (Preliminary result)   Collection Time: 08/11/14 10:45 PM  Result Value Ref Range Status   Specimen Description BLOOD L ARM  Final   Special Requests BOTTLES DRAWN AEROBIC AND ANAEROBIC 5ML  Final   Culture   Final           BLOOD CULTURE RECEIVED NO GROWTH TO DATE CULTURE WILL BE HELD FOR 5 DAYS BEFORE ISSUING A FINAL NEGATIVE REPORT Performed at [facility]    Report Status PENDING  Incomplete  Culture, blood (routine x 2)     Status: None (Preliminary result)   Collection  Time: 08/11/14 10:49 PM  Result Value Ref Range Status   Specimen Description BLOOD LEFT ANTECUBITAL  Final   Special Requests BOTTLES DRAWN AEROBIC AND ANAEROBIC 4CC  Final   Culture   Final           BLOOD CULTURE RECEIVED NO GROWTH TO DATE CULTURE WILL BE HELD FOR 5 DAYS BEFORE ISSUING A FINAL NEGATIVE REPORT Performed at [facility]    Report Status PENDING  Incomplete  MRSA PCR Screening     Status: None   Collection Time: 08/12/14 12:04 AM  Result Value Ref Range Status   MRSA by PCR NEGATIVE NEGATIVE Final    Comment:        The GeneXpert MRSA Assay (FDA approved for NASAL specimens only), is one component of a comprehensive MRSA colonization surveillance program. It is not intended to diagnose MRSA infection nor to guide or monitor treatment for MRSA infections.   Culture, Urine     Status: None   Collection Time: 08/13/14 10:43 AM  Result Value Ref Range Status   Specimen Description URINE, CATHETERIZED  Final   Special Requests NONE  Final   Colony Count NO GROWTH Performed at [facility]   Final   Culture NO GROWTH Performed at [facility]   Final   Report Status 08/14/2014 FINAL  Final    Assessment: 9 yoF presenting with SOB/wheezing; found to have UTI and new diastolic CHF Cefepime and Vancomycin per Rx for Sepsis.  PMH Afib and COPD  2/28 >>  rocephin >> 2/28 2/29 >> vancomycin >> 2/29 >> cefepime >>  Tmax: AF WBCs: decreased to wnl Renal: SCr low; CrCl 41 CG, 44 N using SCr 1 LA resolved  2/28 blood: NGTD 3/1 urine: NGF (UA c/w UTI) - After abx CXR with pulm edema, but no obvious PNA   Goal of Therapy:  Vancomycin trough level 15-20 mcg/ml  Eradication of infection Appropriate dosing for renal function and indication  Plan:   Continue cefepime 1Gm IV q12h  Continue vancomycin 750mg  IV q12h  F/u SCr/cultures  Will order trough for tomorrow if still receiving  Reuel Boom, PharmD Pager:  [phone] 08/14/2014, 12:22 PM

## 2014-08-15 LAB — CBC
HEMATOCRIT: 41.1 % (ref 36.0–46.0)
Hemoglobin: 12.7 g/dL (ref 12.0–15.0)
MCH: 30 pg (ref 26.0–34.0)
MCHC: 30.9 g/dL (ref 30.0–36.0)
MCV: 97.2 fL (ref 78.0–100.0)
Platelets: 364 10*3/uL (ref 150–400)
RBC: 4.23 MIL/uL (ref 3.87–5.11)
RDW: 15.2 % (ref 11.5–15.5)
WBC: 7.8 10*3/uL (ref 4.0–10.5)

## 2014-08-15 LAB — BASIC METABOLIC PANEL
Anion gap: 1 — ABNORMAL LOW (ref 5–15)
BUN: 34 mg/dL — ABNORMAL HIGH (ref 6–23)
CO2: 36 mmol/L — ABNORMAL HIGH (ref 19–32)
CREATININE: 0.54 mg/dL (ref 0.50–1.10)
Calcium: 7.2 mg/dL — ABNORMAL LOW (ref 8.4–10.5)
Chloride: 93 mmol/L — ABNORMAL LOW (ref 96–112)
GFR calc Af Amer: >90 mL/min (ref >90)
GFR, EST NON AFRICAN AMERICAN: 82 mL/min — AB (ref >90)
GLUCOSE: 163 mg/dL — AB (ref 70–99)
POTASSIUM: 4.3 mmol/L (ref 3.5–5.1)
Sodium: 130 mmol/L — ABNORMAL LOW (ref 135–145)

## 2014-08-15 MED ORDER — METOLAZONE 2.5 MG PO TABS
2.5000 mg | ORAL_TABLET | Freq: Every day | ORAL | Status: DC
  Administered 2014-08-16 – 2014-08-20 (×5): 2.5 mg via ORAL
  Filled 2014-08-15 (×8): qty 1

## 2014-08-15 MED ORDER — DIGOXIN 125 MCG PO TABS
0.1250 mg | ORAL_TABLET | ORAL | Status: DC
  Administered 2014-08-15 – 2014-08-19 (×3): 0.125 mg via ORAL
  Filled 2014-08-15 (×3): qty 1

## 2014-08-15 NOTE — Progress Notes (Addendum)
PROGRESS NOTE  Amrit Erck DXI:[id number] DOB: 10-19-1924 DOA: 08/11/2014 PCP: [name], MD  Assessment/Plan: UTI with Sepsis There was also concern about possible respiratory infection as well. She was apparently hypoxic in the emergency department. It appears that a urine culture was not sent at the time of admission. -as expected urine culture is neg Blood cultures are neg. -Blood pressure stable. Lactic acid is normal. WBC is better.  -d/c vanco, continue cefepime  Acute encephalopathy Mental status improved. CT head showed brain atrophy but nothing else that was acute.  Her altered mental status is most likely secondary to acute infection and hypercapnea.  Hypercapnia on ABG is most likely due to her history of COPD and is chronic. Her pH is normal.  Acute on chronic diastolic CHF Has not required BiPAP overnight. Continue with intravenous Lasix. Strict ins and outs. Echocardiogram from January 2015 showed normal EF. No need to repeat. -neg 2.3L -question accuracy of weights -continue IV lasix-->increased to bid -pt takes lasix 40mg  po bid at home -repeat echo -restart home metolazone -repeat CXR and BNP in am  Acute respiratory failure with hypoxia Most likely secondary to the above. Continue to monitor closely. Continue steroids for now. Continue Lasix. -weaned to 4L during the day  History of COPD As above. Continue inhalers D/c solumedrol--no wheezing  History of atrial fibrillation Continue xarelto. Continue diltiazem. Monitor on telemetry.  -HR is controlled -restart home dose digoxin  Questionable history of dementia Daughter did not admit to the same. Previous notes have suggested this condition. She is on Depakote. Levels were nontoxic. She has generalized weakness. PT and OT to follow.  Palliative medicine is also following.  DVT Prophylaxis: On Xarelto  Code Status: DO NOT RESUSCITATE  Family Communication: Discussed with daughter at  bedside Disposition Plan: Transfer to floor       Procedures/Studies: Ct Head Wo Contrast  08/12/2014   CLINICAL DATA:  Acute encephalopathy.  History of dementia.  EXAM: CT HEAD WITHOUT CONTRAST  TECHNIQUE: Contiguous axial images were obtained from the base of the skull through the vertex without intravenous contrast.  COMPARISON:  04/28/2010  FINDINGS: Skull and Sinuses:Negative for fracture or destructive process. There are small nonobstructive osteomas within the left sphenoid and right ethmoid sinuses.  Orbits: No acute abnormality.  Brain: No evidence of acute infarction, hemorrhage, hydrocephalus, or mass lesion/mass effect. Generalized brain atrophy which is fairly typical for age, but new from 2011. Small vessel ischemic change which is mild and stable -gliosis best visualized around the frontal horns of the lateral ventricles.  IMPRESSION: 1. No acute intracranial disease. 2. Brain atrophy which has developed since 2011.   Electronically Signed   By: [name] M.D.   On: 08/12/2014 00:05   Dg Chest Port 1 View  08/13/2014   CLINICAL DATA:  Hypoxia, dementia, CHF, COPD, atrial fibrillation, history blood clots  EXAM: PORTABLE CHEST - 1 VIEW  COMPARISON:  Portable exam 0747 hours compared to 08/11/2014  FINDINGS: Rotated to the RIGHT.  Enlargement of cardiac silhouette with pulmonary vascular congestion.  Bibasilar effusions and atelectasis greater on LEFT.  Minimal central peribronchial thickening and question mild perihilar edema, probably little changed when accounting for differences in technique.  No pneumothorax.  Bones demineralized with RIGHT glenohumeral degenerative changes.  IMPRESSION: Enlargement of cardiac silhouette with pulmonary vascular congestion and question mild pulmonary edema, little changed.  Bibasilar effusions and atelectasis LEFT greater than RIGHT.   Electronically Signed  By: [name] M.D.   On: 08/13/2014 08:04   Dg Chest Portable 1 View  08/11/2014    CLINICAL DATA:  Shortness of breath, weakness, lethargy  EXAM: PORTABLE CHEST - 1 VIEW  COMPARISON:  02/22/2014  FINDINGS: Cardiomegaly with pulmonary vascular congestion and possible mild interstitial edema. Suspected layering left pleural effusion. No pneumothorax.  IMPRESSION: Cardiomegaly with possible mild interstitial edema.  Suspected layering left pleural effusion.   Electronically Signed   By: [name] M.D.   On: 08/11/2014 18:26         Subjective: Overall the patient's breathing better. She denies any fevers, chills, chest pain, nausea, vomiting, diarrhea, abdominal pain, dysuria.  Objective: Filed Vitals:   08/15/14 0400 08/15/14 0500 08/15/14 0800 08/15/14 0921  BP: 110/66  162/66   Pulse: 72  62   Temp:   97.5 F (36.4 C)   TempSrc:   Axillary   Resp: 24  30   Height:      Weight:  87.5 kg (192 lb 14.4 oz)    SpO2: 96%  97% 97%    Intake/Output Summary (Last 24 hours) at 08/15/14 1515 Last data filed at 08/15/14 0302  Gross per 24 hour  Intake      0 ml  Output   1050 ml  Net  -1050 ml   Weight change: -0.4 kg (-14.1 oz) Exam:   General:  Pt is alert, follows commands appropriately, not in acute distress  HEENT: No icterus, No thrush,[place]/AT  Cardiovascular: RRR, S1/S2, no rubs, no gallops  Respiratory: Bibasilar crackles. No wheezing. Good air movement  Abdomen: Soft/+BS, non tender, non distended, no guarding  Extremities: trace LE edema, No lymphangitis, No petechiae, No rashes, no synovitis  Data Reviewed: Basic Metabolic Panel:  Recent Labs Lab 08/11/14 1740 08/12/14 0356 08/13/14 0358 08/14/14 0400 08/15/14 0318  NA 140 140 140 139 130*  K 4.8 4.6 4.0 4.4 4.3  CL 95* 95* 96 94* 93*  CO2 34* 33* 35* 36* 36*  GLUCOSE 151* 178* 164* 140* 163*  BUN 26* 28* 42* 42* 34*  CREATININE 0.77 0.72 0.58 0.52 0.54  CALCIUM 8.6 8.5 8.4 8.3* 7.2*   Liver Function Tests:  Recent Labs Lab 08/11/14 1740 08/12/14 0356  AST 23 22  ALT 15  12  ALKPHOS 89 84  BILITOT 0.8 0.8  PROT 7.0 6.8  ALBUMIN 2.8* 2.6*   No results for input(s): LIPASE, AMYLASE in the last 168 hours.  Recent Labs Lab 08/11/14 2251  AMMONIA 22   CBC:  Recent Labs Lab 08/11/14 1740 08/12/14 0356 08/13/14 0358 08/14/14 0400 08/15/14 0318  WBC 19.7* 12.9* 10.6* 8.4 7.8  NEUTROABS 16.2* 11.6*  --   --   --   HGB 14.3 13.4 13.1 13.1 12.7  HCT 45.9 44.5 42.6 42.3 41.1  MCV 99.6 100.7* 99.5 98.4 97.2  PLT 413* 362 405* 383 364   Cardiac Enzymes: No results for input(s): CKTOTAL, CKMB, CKMBINDEX, TROPONINI in the last 168 hours. BNP: Invalid input(s): POCBNP CBG: No results for input(s): GLUCAP in the last 168 hours.  Recent Results (from the past 240 hour(s))  Culture, blood (routine x 2)     Status: None (Preliminary result)   Collection Time: 08/11/14 10:45 PM  Result Value Ref Range Status   Specimen Description BLOOD L ARM  Final   Special Requests BOTTLES DRAWN AEROBIC AND ANAEROBIC 5ML  Final   Culture   Final  BLOOD CULTURE RECEIVED NO GROWTH TO DATE CULTURE WILL BE HELD FOR 5 DAYS BEFORE ISSUING A FINAL NEGATIVE REPORT Performed at [facility]    Report Status PENDING  Incomplete  Culture, blood (routine x 2)     Status: None (Preliminary result)   Collection Time: 08/11/14 10:49 PM  Result Value Ref Range Status   Specimen Description BLOOD LEFT ANTECUBITAL  Final   Special Requests BOTTLES DRAWN AEROBIC AND ANAEROBIC 4CC  Final   Culture   Final           BLOOD CULTURE RECEIVED NO GROWTH TO DATE CULTURE WILL BE HELD FOR 5 DAYS BEFORE ISSUING A FINAL NEGATIVE REPORT Performed at [facility]    Report Status PENDING  Incomplete  MRSA PCR Screening     Status: None   Collection Time: 08/12/14 12:04 AM  Result Value Ref Range Status   MRSA by PCR NEGATIVE NEGATIVE Final    Comment:        The GeneXpert MRSA Assay (FDA approved for NASAL specimens only), is one component of a comprehensive  MRSA colonization surveillance program. It is not intended to diagnose MRSA infection nor to guide or monitor treatment for MRSA infections.   Culture, Urine     Status: None   Collection Time: 08/13/14 10:43 AM  Result Value Ref Range Status   Specimen Description URINE, CATHETERIZED  Final   Special Requests NONE  Final   Colony Count NO GROWTH Performed at [facility]   Final   Culture NO GROWTH Performed at [facility]   Final   Report Status 08/14/2014 FINAL  Final     Scheduled Meds: . antiseptic oral rinse  7 mL Mouth Rinse q12n4p  . budesonide-formoterol  2 puff Inhalation BID  . ceFEPime (MAXIPIME) IV  1 g Intravenous Q12H  . chlorhexidine  15 mL Mouth Rinse BID  . diltiazem  120 mg Oral Daily  . divalproex  125 mg Oral BID  . furosemide  40 mg Intravenous BID  . rivaroxaban  20 mg Oral Q24H  . sodium chloride  3 mL Intravenous Q12H   Continuous Infusions:    Bary Limbach, DO  [facility] Pager [phone]  If 7PM-7AM, please contact night-coverage [email or web address] Password [email or web address] 08/15/2014, 3:15 PM   LOS: 4 days

## 2014-08-15 NOTE — Progress Notes (Signed)
[place] for Xarelto Indication: A-fib  Allergies  Allergen Reactions  . Chocolate Itching  . Ciprofloxacin Itching  . Coffee Bean Extract [Coffea Arabica] Itching  . Coffee Flavor Itching  . Gentamycin [Gentamicin] Itching  . Imodium [Loperamide] Other (See Comments)    unknown  . Penicillins Itching  . Sulfa Antibiotics Itching  . Tea Itching    Patient Measurements: Height: 5\' 4"  (162.6 cm) Weight: 192 lb 14.4 oz (87.5 kg) IBW/kg (Calculated) : 54.7   Vital Signs: Temp: 97.5 F (36.4 C) (03/03 0800) Temp Source: Axillary (03/03 0800) BP: 162/66 mmHg (03/03 0800) Pulse Rate: 62 (03/03 0800)  Labs:  Recent Labs  08/13/14 0358 08/14/14 0400 08/15/14 0318  HGB 13.1 13.1 12.7  HCT 42.6 42.3 41.1  PLT 405* 383 364  CREATININE 0.58 0.52 0.54    Estimated Creatinine Clearance: 52 mL/min (by C-G formula based on Cr of 0.54).   Medical History: Past Medical History  Diagnosis Date  . Arrhythmia   . Arthritis   . Atrial fibrillation     Remains stable (As of 05/09/13)  . H/O blood clots   . Hyperlipemia   . Dementia     Remains stable & continues to function adequately in the current living environment with supervision. Has had little changes in behavior (AS OF 05/09/13)  . GERD (gastroesophageal reflux disease)     Stable (As of 05/09/13)  . Osteoarthritis     Denies pain (As of 05/09/13)  . CHF (congestive heart failure)     chronic diastolic  . COPD (chronic obstructive pulmonary disease)     w/exacerbation    Medications:  Scheduled:  . antiseptic oral rinse  7 mL Mouth Rinse q12n4p  . budesonide-formoterol  2 puff Inhalation BID  . ceFEPime (MAXIPIME) IV  1 g Intravenous Q12H  . chlorhexidine  15 mL Mouth Rinse BID  . diltiazem  120 mg Oral Daily  . divalproex  125 mg Oral BID  . furosemide  40 mg Intravenous BID  . rivaroxaban  20 mg Oral Q24H  . sodium chloride  3 mL Intravenous Q12H   Infusions:      Assessment: 62 yoF presenting with SOB/wheezing; found to have UTI and new diastolic CHF Cefepime and Vancomycin per Rx for Sepsis.PMH COPD; Afib on Xarelto PTA.  On Diltiazem PTA, also resumed  Today: 08/15/2014:  CrCl 54 using SCr 1.0; 67 using SCr 0.8 and total body weight for Xarelto dosing  CBC wnl  No bleeding noted per nursing  No missed doses   Goal of Therapy:  Prevent VTE   Plan:   Continue Xarelto 20mg  daily  Follow SCr/CBC daily  Will follow peripherally   Reuel Boom, PharmD Pager: [phone] 08/15/2014, 10:35 AM

## 2014-08-15 NOTE — Progress Notes (Signed)
CSW continuing to follow.   Pt admitted from [facility] where pt is a long term resident and pt and pt family plans for pt to return to [facility] when medically stable for discharge.  CSW met with pt and pt daughter at bedside. Pt sitting up in bed and pt daughter was about to assist pt with feeding. Pt daughter shared that per MD anticipate transfer to unit today. CSW updated pt daughter that CSW has updated [facility] and facility can accept pt back when pt medically stable. Pt daughter appreciative.  CSW contacted [facility] and provided an update.  CSW to continue to follow to provide support and assist with pt disposition needs.   Alison Murray, MSW, [place] [facility] [phone]

## 2014-08-16 ENCOUNTER — Inpatient Hospital Stay (HOSPITAL_COMMUNITY): Payer: Medicare Other

## 2014-08-16 DIAGNOSIS — I509 Heart failure, unspecified: Secondary | ICD-10-CM

## 2014-08-16 LAB — BLOOD GAS, ARTERIAL
ACID-BASE EXCESS: 14.7 mmol/L — AB (ref 0.0–2.0)
Bicarbonate: 42.1 mEq/L — ABNORMAL HIGH (ref 20.0–24.0)
Drawn by: 295031
O2 CONTENT: 2 L/min
O2 Saturation: 93.4 %
Patient temperature: 98.6
TCO2: 36.8 mmol/L (ref 0–100)
pCO2 arterial: 64.1 mmHg (ref 35.0–45.0)
pH, Arterial: 7.433 (ref 7.350–7.450)
pO2, Arterial: 67.6 mmHg — ABNORMAL LOW (ref 80.0–100.0)

## 2014-08-16 LAB — BASIC METABOLIC PANEL
Anion gap: 4 — ABNORMAL LOW (ref 5–15)
BUN: 31 mg/dL — AB (ref 6–23)
CHLORIDE: 92 mmol/L — AB (ref 96–112)
CO2: 41 mmol/L — AB (ref 19–32)
Calcium: 8 mg/dL — ABNORMAL LOW (ref 8.4–10.5)
Creatinine, Ser: 0.59 mg/dL (ref 0.50–1.10)
GFR calc Af Amer: >90 mL/min (ref >90)
GFR calc non Af Amer: 79 mL/min — ABNORMAL LOW (ref >90)
Glucose, Bld: 105 mg/dL — ABNORMAL HIGH (ref 70–99)
POTASSIUM: 3.9 mmol/L (ref 3.5–5.1)
Sodium: 137 mmol/L (ref 135–145)

## 2014-08-16 LAB — BRAIN NATRIURETIC PEPTIDE: B Natriuretic Peptide: 147.8 pg/mL — ABNORMAL HIGH (ref 0.0–100.0)

## 2014-08-16 MED ORDER — FUROSEMIDE 10 MG/ML IJ SOLN
80.0000 mg | Freq: Two times a day (BID) | INTRAMUSCULAR | Status: AC
  Administered 2014-08-16 – 2014-08-18 (×5): 80 mg via INTRAVENOUS
  Filled 2014-08-16 (×5): qty 8

## 2014-08-16 NOTE — Progress Notes (Signed)
CRITICAL VALUE ALERT  Critical value received:  Co2 from ABG is 64.1  Date of notification:  08/16/2014  Time of notification: 7782  Critical value read back: yes  Nurse who received alert: Kaylyn Layer  MD notified (1st page):  Dr. Carles Collet  Time of first page:  Verbally told MD at 1746   Responding MD:  Dr. Carles Collet  Time MD responded:  1746  MD evaluated chart and updated daughters at bedside. Ordered CPAP at HS.

## 2014-08-16 NOTE — Progress Notes (Signed)
Physical Therapy Treatment Patient Details Name: Jennifer Nielsen MRN: [id number] DOB: [birth year] Today's Date: 08/16/2014    History of Present Illness Jennifer Nielsen is a 81 y.o. female with Past medical history of atrial fibrillation, on Xarelto, GERD, chronic diastolic CHF, dementia, COPD not on oxygen, bedbound. Admitted 08/11/14 with SOB, decreased rsponsiveness,  hypoxia, CHF, sepsis., UTI. Patient is long term resident of [place], bed/WC bound after  multiple hip fractures. initially required BiPAP, NRB,.    PT Comments    Pt in bed on O2 groggy.  Performed PROM/AAROM B LE pt 10% 10 reps AP, HS, ABD and SLR.  Performed side to side rolling L and R to change bed linen due to urine/BM incont + 2 assist pt 5%.  Repositioned to comfort scooting to [facility] + 2 assist pt 0%.  Rec nursing use lift to assist pt OOB to recliner.   Follow Up Recommendations  No PT follow up;SNF     Equipment Recommendations  None recommended by PT    Recommendations for Other Services       Precautions / Restrictions Precautions Precautions: Fall Precaution Comments: monitor VS Restrictions Weight Bearing Restrictions: No    Mobility  Bed Mobility Overal bed mobility: Needs Assistance;+2 for physical assistance;+ 2 for safety/equipment Bed Mobility: Rolling Rolling: +2 for physical assistance (pt 5%)         General bed mobility comments: side to side rolling for hygiene self care + 2 assist pt 5%  Transfers                    Ambulation/Gait                 Stairs            Wheelchair Mobility    Modified Rankin (Stroke Patients Only)       Balance                                    Cognition Arousal/Alertness: Awake/alert Behavior During Therapy: Flat affect (groggy)                        Exercises      General Comments        Pertinent Vitals/Pain Pain Assessment: No/denies pain    Home Living                       Prior Function            PT Goals (current goals can now be found in the care plan section) Progress towards PT goals: Progressing toward goals    Frequency  Min 2X/week    PT Plan      Co-evaluation             End of Session Equipment Utilized During Treatment: Oxygen Activity Tolerance: Patient limited by fatigue Patient left: in bed;with call bell/phone within reach     Time: 1358-1415 PT Time Calculation (min) (ACUTE ONLY): 17 min  Charges:  $Therapeutic Activity: 8-22 mins                    G Codes:      Jennifer Nielsen  PTA [facility]  Acute  Rehab Pager      [phone]

## 2014-08-16 NOTE — Progress Notes (Signed)
CRITICAL VALUE ALERT  Critical value received:  CO2 41  Date of notification:  08/16/2014  Time of notification:  0530  Critical value read back:Yes.    Nurse who received alert:  Carnella Guadalajara I   MD notified (1st page):  Lamar Blinks, NP  Time of first page:  [phone]  Responding MD:  n/a  Time MD responded:  n/a

## 2014-08-16 NOTE — Progress Notes (Signed)
PROGRESS NOTE  Jennifer Nielsen RKY:[id number] DOB: 1925/05/26 DOA: 08/11/2014 PCP: [name], MD  Assessment/Plan: UTI with Sepsis There was also concern about possible respiratory infection as well. She was apparently hypoxic in the emergency department. It appears that a urine culture was not sent at the time of admission. -as expected urine culture is neg Blood cultures are neg. -Blood pressure stable. Lactic acid is normal. WBC is better.  -d/c vanco, continue cefepime D#5/7  Acute encephalopathy Mental status improved but still a little somnolent.  CT head showed brain atrophy but nothing else that was acute.  Her altered mental status is most likely secondary to acute infection and hypercapnea.  Hypercapnia on ABG is most likely due to her history of COPD and is chronic. Her pH is normal.  Acute on chronic diastolic CHF Has not required BiPAP overnight. Continue with intravenous Lasix. Strict ins and outs. Echocardiogram from January 2015 showed normal EF. No need to repeat. -neg 3.2L -question accuracy of weights -3/4--discussed with daughter and reviewed MAR from SNF--pt was getting torsemide and furosemide -continue IV lasix-->increased to 80mg  bid -pt takes lasix 40mg  po bid at home -repeat echo--EF 50-55%. No WMA -restart home metolazone -repeat CXR--no appreciable change -BNP 147 Acute respiratory failure with hypoxia Most likely secondary to the above. Continue to monitor closely. Continue steroids for now. Continue Lasix. -weaned to 4L during the day  History of COPD As above. Continue inhalers D/c solumedrol--no wheezing  History of atrial fibrillation Continue xarelto. Continue diltiazem. Monitor on telemetry.  -HR is controlled -restart home dose digoxin  Questionable history of dementia Daughter did not admit to the same. Previous notes have suggested this condition. She is on Depakote. Levels were nontoxic. She has generalized weakness. PT and  OT to follow.    DVT Prophylaxis: On Xarelto  Code Status: DO NOT RESUSCITATE  Family Communication: Discussed with daughter at bedside      Procedures/Studies: Ct Head Wo Contrast  08/12/2014   CLINICAL DATA:  Acute encephalopathy.  History of dementia.  EXAM: CT HEAD WITHOUT CONTRAST  TECHNIQUE: Contiguous axial images were obtained from the base of the skull through the vertex without intravenous contrast.  COMPARISON:  04/28/2010  FINDINGS: Skull and Sinuses:Negative for fracture or destructive process. There are small nonobstructive osteomas within the left sphenoid and right ethmoid sinuses.  Orbits: No acute abnormality.  Brain: No evidence of acute infarction, hemorrhage, hydrocephalus, or mass lesion/mass effect. Generalized brain atrophy which is fairly typical for age, but new from 2011. Small vessel ischemic change which is mild and stable -gliosis best visualized around the frontal horns of the lateral ventricles.  IMPRESSION: 1. No acute intracranial disease. 2. Brain atrophy which has developed since 2011.   Electronically Signed   By: [name] M.D.   On: 08/12/2014 00:05   Dg Chest Port 1 View  08/16/2014   CLINICAL DATA:  Shortness of Breath  EXAM: PORTABLE CHEST - 1 VIEW  COMPARISON:  August 13, 2014  FINDINGS: There is persistent cardiomegaly with pulmonary vascular within normal limits. Bilateral effusions remain. Consolidation in both lower lobes is stable. No new opacity is apparent. Bones are osteoporotic. There old healed rib fractures on the right. There is arthropathy in both shoulders, apparently more severe on the right than on the left.  IMPRESSION: Cardiomegaly with bilateral effusions. Suspect a degree of congestive heart failure. Question alveolar edema versus pneumonia in the bases. Both entities may exist concurrently.  There is no appreciable change compared to recent prior study.   Electronically Signed   By: Lowella Grip III M.D.   On: 08/16/2014  07:25   Dg Chest Port 1 View  08/13/2014   CLINICAL DATA:  Hypoxia, dementia, CHF, COPD, atrial fibrillation, history blood clots  EXAM: PORTABLE CHEST - 1 VIEW  COMPARISON:  Portable exam 0747 hours compared to 08/11/2014  FINDINGS: Rotated to the RIGHT.  Enlargement of cardiac silhouette with pulmonary vascular congestion.  Bibasilar effusions and atelectasis greater on LEFT.  Minimal central peribronchial thickening and question mild perihilar edema, probably little changed when accounting for differences in technique.  No pneumothorax.  Bones demineralized with RIGHT glenohumeral degenerative changes.  IMPRESSION: Enlargement of cardiac silhouette with pulmonary vascular congestion and question mild pulmonary edema, little changed.  Bibasilar effusions and atelectasis LEFT greater than RIGHT.   Electronically Signed   By: [name] M.D.   On: 08/13/2014 08:04   Dg Chest Portable 1 View  08/11/2014   CLINICAL DATA:  Shortness of breath, weakness, lethargy  EXAM: PORTABLE CHEST - 1 VIEW  COMPARISON:  02/22/2014  FINDINGS: Cardiomegaly with pulmonary vascular congestion and possible mild interstitial edema. Suspected layering left pleural effusion. No pneumothorax.  IMPRESSION: Cardiomegaly with possible mild interstitial edema.  Suspected layering left pleural effusion.   Electronically Signed   By: [name] M.D.   On: 08/11/2014 18:26         Subjective: Patient denies fevers, chills, headache, chest pain, dyspnea, nausea, vomiting, diarrhea, abdominal pain, dysuria, hematuria   Objective: Filed Vitals:   08/15/14 2018 08/15/14 2106 08/16/14 0559 08/16/14 0933  BP:  117/75 127/67   Pulse:  90 78   Temp:  97.6 F (36.4 C) 97.4 F (36.3 C)   TempSrc:  Oral Oral   Resp:  22 20   Height:      Weight:   87.5 kg (192 lb 14.4 oz)   SpO2: 92% 95% 94% 96%    Intake/Output Summary (Last 24 hours) at 08/16/14 1438 Last data filed at 08/16/14 1230  Gross per 24 hour  Intake     980 ml  Output    925 ml  Net     55 ml   Weight change: 0 kg (0 lb) Exam:   General:  Pt is alert, follows commands appropriately, not in acute distress  HEENT: No icterus, No thrush,  [place]/AT  Cardiovascular: RRR, S1/S2, no rubs, no gallops  Respiratory: Bibasilar crackles, left greater than right. No wheezing. Good air movement  Abdomen: Soft/+BS, non tender, non distended, no guarding  Extremities: 1+LE edema, No lymphangitis, No petechiae, No rashes, no synovitis  Data Reviewed: Basic Metabolic Panel:  Recent Labs Lab 08/12/14 0356 08/13/14 0358 08/14/14 0400 08/15/14 0318 08/16/14 0440  NA 140 140 139 130* 137  K 4.6 4.0 4.4 4.3 3.9  CL 95* 96 94* 93* 92*  CO2 33* 35* 36* 36* 41*  GLUCOSE 178* 164* 140* 163* 105*  BUN 28* 42* 42* 34* 31*  CREATININE 0.72 0.58 0.52 0.54 0.59  CALCIUM 8.5 8.4 8.3* 7.2* 8.0*   Liver Function Tests:  Recent Labs Lab 08/11/14 1740 08/12/14 0356  AST 23 22  ALT 15 12  ALKPHOS 89 84  BILITOT 0.8 0.8  PROT 7.0 6.8  ALBUMIN 2.8* 2.6*   No results for input(s): LIPASE, AMYLASE in the last 168 hours.  Recent Labs Lab 08/11/14 2251  AMMONIA 22   CBC:  Recent Labs Lab  08/11/14 1740 08/12/14 0356 08/13/14 0358 08/14/14 0400 08/15/14 0318  WBC 19.7* 12.9* 10.6* 8.4 7.8  NEUTROABS 16.2* 11.6*  --   --   --   HGB 14.3 13.4 13.1 13.1 12.7  HCT 45.9 44.5 42.6 42.3 41.1  MCV 99.6 100.7* 99.5 98.4 97.2  PLT 413* 362 405* 383 364   Cardiac Enzymes: No results for input(s): CKTOTAL, CKMB, CKMBINDEX, TROPONINI in the last 168 hours. BNP: Invalid input(s): POCBNP CBG: No results for input(s): GLUCAP in the last 168 hours.  Recent Results (from the past 240 hour(s))  Culture, blood (routine x 2)     Status: None (Preliminary result)   Collection Time: 08/11/14 10:45 PM  Result Value Ref Range Status   Specimen Description BLOOD L ARM  Final   Special Requests BOTTLES DRAWN AEROBIC AND ANAEROBIC 5ML  Final   Culture    Final           BLOOD CULTURE RECEIVED NO GROWTH TO DATE CULTURE WILL BE HELD FOR 5 DAYS BEFORE ISSUING A FINAL NEGATIVE REPORT Performed at [facility]    Report Status PENDING  Incomplete  Culture, blood (routine x 2)     Status: None (Preliminary result)   Collection Time: 08/11/14 10:49 PM  Result Value Ref Range Status   Specimen Description BLOOD LEFT ANTECUBITAL  Final   Special Requests BOTTLES DRAWN AEROBIC AND ANAEROBIC 4CC  Final   Culture   Final           BLOOD CULTURE RECEIVED NO GROWTH TO DATE CULTURE WILL BE HELD FOR 5 DAYS BEFORE ISSUING A FINAL NEGATIVE REPORT Performed at [facility]    Report Status PENDING  Incomplete  MRSA PCR Screening     Status: None   Collection Time: 08/12/14 12:04 AM  Result Value Ref Range Status   MRSA by PCR NEGATIVE NEGATIVE Final    Comment:        The GeneXpert MRSA Assay (FDA approved for NASAL specimens only), is one component of a comprehensive MRSA colonization surveillance program. It is not intended to diagnose MRSA infection nor to guide or monitor treatment for MRSA infections.   Culture, Urine     Status: None   Collection Time: 08/13/14 10:43 AM  Result Value Ref Range Status   Specimen Description URINE, CATHETERIZED  Final   Special Requests NONE  Final   Colony Count NO GROWTH Performed at [facility]   Final   Culture NO GROWTH Performed at [facility]   Final   Report Status 08/14/2014 FINAL  Final     Scheduled Meds: . antiseptic oral rinse  7 mL Mouth Rinse q12n4p  . budesonide-formoterol  2 puff Inhalation BID  . ceFEPime (MAXIPIME) IV  1 g Intravenous Q12H  . chlorhexidine  15 mL Mouth Rinse BID  . digoxin  0.125 mg Oral Q48H  . diltiazem  120 mg Oral Daily  . divalproex  125 mg Oral BID  . furosemide  80 mg Intravenous BID  . metolazone  2.5 mg Oral Daily  . rivaroxaban  20 mg Oral Q24H  . sodium chloride  3 mL Intravenous Q12H   Continuous Infusions:     Abagayle Klutts, DO  [facility] Pager [phone]  If 7PM-7AM, please contact night-coverage [email or web address] Password [email or web address] 08/16/2014, 2:38 PM   LOS: 5 days

## 2014-08-16 NOTE — Progress Notes (Signed)
Revisited pt after ABG 7.43/64/67/42 on 2L Discussed with daughters at bedside  Pt is alert and oriented and conversant and answering questions appropriately VSS Will try CPAP at night  DTat

## 2014-08-16 NOTE — Progress Notes (Signed)
Clinical Social Work  Patient was discussed during progression meeting and MD reports that patient is not medically stable today but possibly ready over the weekend. CSW spoke with [facility] who can accept patient over the weekend if stable and handoff was left for weekend CSW to contact supervisor Theodoro Grist [phone]) at SNF if patient is stable.  CSW will continue to follow.  Sindy Messing, LCSW (Coverage for [employer])

## 2014-08-16 NOTE — Progress Notes (Signed)
Echocardiogram 2D Echocardiogram has been performed.  Twila Rappa 08/16/2014, 9:52 AM

## 2014-08-17 LAB — BASIC METABOLIC PANEL
Anion gap: 10 (ref 5–15)
BUN: 28 mg/dL — AB (ref 6–23)
CALCIUM: 7.9 mg/dL — AB (ref 8.4–10.5)
CHLORIDE: 84 mmol/L — AB (ref 96–112)
CO2: 43 mmol/L (ref 19–32)
Creatinine, Ser: 0.49 mg/dL — ABNORMAL LOW (ref 0.50–1.10)
GFR calc Af Amer: >90 mL/min (ref >90)
GFR calc non Af Amer: 84 mL/min — ABNORMAL LOW (ref >90)
Glucose, Bld: 120 mg/dL — ABNORMAL HIGH (ref 70–99)
Potassium: 3.5 mmol/L (ref 3.5–5.1)
SODIUM: 137 mmol/L (ref 135–145)

## 2014-08-17 LAB — MAGNESIUM: Magnesium: 2.1 mg/dL (ref 1.5–2.5)

## 2014-08-17 MED ORDER — POTASSIUM CHLORIDE CRYS ER 20 MEQ PO TBCR
20.0000 meq | EXTENDED_RELEASE_TABLET | Freq: Once | ORAL | Status: AC
  Administered 2014-08-17: 20 meq via ORAL
  Filled 2014-08-17: qty 1

## 2014-08-17 NOTE — Procedures (Signed)
Pt placed on hospital cpap machine with a setting of  10 cm H2O and 4L on oxygen.  Pt placed on full face mask and is resting comfortably at this time.

## 2014-08-17 NOTE — Progress Notes (Signed)
Pt placed on CPAP at 10 CMH20 with 2 LPM O2 bleed in via FFM.  Pt tolerating well at this time, RT to monitor and assess as needed.

## 2014-08-17 NOTE — Progress Notes (Signed)
ANTIBIOTIC CONSULT NOTE  Pharmacy Consult for Cefepime Indication: Sepsis  Allergies  Allergen Reactions  . Chocolate Itching  . Ciprofloxacin Itching  . Coffee Bean Extract [Coffea Arabica] Itching  . Coffee Flavor Itching  . Gentamycin [Gentamicin] Itching  . Imodium [Loperamide] Other (See Comments)    unknown  . Penicillins Itching  . Sulfa Antibiotics Itching  . Tea Itching    Patient Measurements: Height: 5\' 4"  (162.6 cm) Weight: 192 lb (87.091 kg) IBW/kg (Calculated) : 54.7 Wt=92 kg   Vital Signs: Temp: 98 F (36.7 C) (03/05 0505) Temp Source: Oral (03/05 0505) BP: 137/62 mmHg (03/05 0505) Pulse Rate: 93 (03/05 0505) Intake/Output from previous day: 03/04 0701 - 03/05 0700 In: 1100 [P.O.:1050; IV Piggyback:50] Out: -  Intake/Output from this shift: Total I/O In: 10 [I.V.:10] Out: -   Labs:  Recent Labs  08/15/14 0318 08/16/14 0440 08/17/14 0420  WBC 7.8  --   --   HGB 12.7  --   --   PLT 364  --   --   CREATININE 0.54 0.59 0.49*   Estimated Creatinine Clearance: 52 mL/min (by C-G formula based on Cr of 0.49). No results for input(s): VANCOTROUGH, VANCOPEAK, VANCORANDOM, GENTTROUGH, GENTPEAK, GENTRANDOM, TOBRATROUGH, TOBRAPEAK, TOBRARND, AMIKACINPEAK, AMIKACINTROU, AMIKACIN in the last 72 hours.   Microbiology: Recent Results (from the past 720 hour(s))  Culture, blood (routine x 2)     Status: None (Preliminary result)   Collection Time: 08/11/14 10:45 PM  Result Value Ref Range Status   Specimen Description BLOOD L ARM  Final   Special Requests BOTTLES DRAWN AEROBIC AND ANAEROBIC 5ML  Final   Culture   Final           BLOOD CULTURE RECEIVED NO GROWTH TO DATE CULTURE WILL BE HELD FOR 5 DAYS BEFORE ISSUING A FINAL NEGATIVE REPORT Performed at [facility]    Report Status PENDING  Incomplete  Culture, blood (routine x 2)     Status: None (Preliminary result)   Collection Time: 08/11/14 10:49 PM  Result Value Ref Range Status   Specimen Description BLOOD LEFT ANTECUBITAL  Final   Special Requests BOTTLES DRAWN AEROBIC AND ANAEROBIC 4CC  Final   Culture   Final           BLOOD CULTURE RECEIVED NO GROWTH TO DATE CULTURE WILL BE HELD FOR 5 DAYS BEFORE ISSUING A FINAL NEGATIVE REPORT Performed at [facility]    Report Status PENDING  Incomplete  MRSA PCR Screening     Status: None   Collection Time: 08/12/14 12:04 AM  Result Value Ref Range Status   MRSA by PCR NEGATIVE NEGATIVE Final    Comment:        The GeneXpert MRSA Assay (FDA approved for NASAL specimens only), is one component of a comprehensive MRSA colonization surveillance program. It is not intended to diagnose MRSA infection nor to guide or monitor treatment for MRSA infections.   Culture, Urine     Status: None   Collection Time: 08/13/14 10:43 AM  Result Value Ref Range Status   Specimen Description URINE, CATHETERIZED  Final   Special Requests NONE  Final   Colony Count NO GROWTH Performed at [facility]   Final   Culture NO GROWTH Performed at [facility]   Final   Report Status 08/14/2014 FINAL  Final    Assessment: 57 yoF presenting with SOB/wheezing; found to have UTI and new diastolic CHF Cefepime and Vancomycin per Rx for  UTI with sepsis.  PMH Afib and COPD  2/28 >> rocephin >> 2/28 2/29 >> vancomycin >> 3/2 2/29 >> cefepime >>  Tmax: AF WBCs: decreased to wnl Renal: SCr low but stable; CrCl 52 CG, 44 N using SCr 1 LA resolved  2/28 blood: NGTD 3/1 urine: NGF (UA c/w UTI) - After abx CXR with pulm edema, but no obvious PNA   Goal of Therapy:  Vancomycin trough level 15-20 mcg/ml  Eradication of infection Appropriate dosing for renal function and indication  Plan: Day #6/7 Cefepime  Continue cefepime 1Gm IV q12h to complete 7 days total per Md notes   Adrian Saran, PharmD, BCPS Pager [phone] 08/17/2014 10:39 AM

## 2014-08-17 NOTE — Progress Notes (Addendum)
PROGRESS NOTE  Jennifer Nielsen UVO:[id number] DOB: 10/21/1924 DOA: 08/11/2014 PCP: [name], MD  Assessment/Plan: UTI with Sepsis There was also concern about possible respiratory infection as well. She was apparently hypoxic in the emergency department. It appears that a urine culture was not sent at the time of admission. -as expected urine culture is neg Blood cultures are neg. -Blood pressure stable. Lactic acid is normal. WBC is better.  -d/c vanco, continue cefepime D#6/7  Acute encephalopathy Mental status improved  CT head showed brain atrophy but nothing else that was acute.  Her altered mental status is most likely secondary to acute infection and hypercapnea.  Hypercapnia on ABG is most likely due to her history of COPD and is chronic. Her pH is normal. -night time CPAP--pt is tolerating  Acute on chronic diastolic CHF Has not required BiPAP overnight. Continue with intravenous Lasix. Strict ins and outs. Echocardiogram from January 2015 showed normal EF. No need to repeat. -neg 3.2L -question accuracy of weights -3/4--discussed with daughter and reviewed MAR from SNF--pt was getting torsemide and furosemide -continue IV lasix-->increased to 80mg  bid -pt takes lasix 40mg  po bid at home -repeat echo--EF 50-55%. No WMA -restarted home metolazone -3/3--repeat CXR--no appreciable change -BNP 147  Acute respiratory failure with hypoxia and hypercarbia -secondary to CHF.Continue Lasix. -weaned to 2L during the day  History of COPD As above. Continue inhalers D/c solumedrol--no wheezing  History of atrial fibrillation Continue xarelto. Continue diltiazem. Monitor on telemetry.  -HR is controlled -restart home dose digoxin  Hypokalemia -replete  Questionable history of dementia Daughter did not admit to the same. Previous notes have suggested this condition. She is on Depakote. Levels were nontoxic. She has generalized weakness. PT and OT to  follow.    DVT Prophylaxis: On Xarelto  Code Status: DO NOT RESUSCITATE  Family Communication: Discussed with daughter at bedside        Procedures/Studies: Ct Head Wo Contrast  08/12/2014   CLINICAL DATA:  Acute encephalopathy.  History of dementia.  EXAM: CT HEAD WITHOUT CONTRAST  TECHNIQUE: Contiguous axial images were obtained from the base of the skull through the vertex without intravenous contrast.  COMPARISON:  04/28/2010  FINDINGS: Skull and Sinuses:Negative for fracture or destructive process. There are small nonobstructive osteomas within the left sphenoid and right ethmoid sinuses.  Orbits: No acute abnormality.  Brain: No evidence of acute infarction, hemorrhage, hydrocephalus, or mass lesion/mass effect. Generalized brain atrophy which is fairly typical for age, but new from 2011. Small vessel ischemic change which is mild and stable -gliosis best visualized around the frontal horns of the lateral ventricles.  IMPRESSION: 1. No acute intracranial disease. 2. Brain atrophy which has developed since 2011.   Electronically Signed   By: [name] M.D.   On: 08/12/2014 00:05   Dg Chest Port 1 View  08/16/2014   CLINICAL DATA:  Shortness of Breath  EXAM: PORTABLE CHEST - 1 VIEW  COMPARISON:  August 13, 2014  FINDINGS: There is persistent cardiomegaly with pulmonary vascular within normal limits. Bilateral effusions remain. Consolidation in both lower lobes is stable. No new opacity is apparent. Bones are osteoporotic. There old healed rib fractures on the right. There is arthropathy in both shoulders, apparently more severe on the right than on the left.  IMPRESSION: Cardiomegaly with bilateral effusions. Suspect a degree of congestive heart failure. Question alveolar edema versus pneumonia in the bases. Both entities may exist concurrently. There is no appreciable  change compared to recent prior study.   Electronically Signed   By: Lowella Grip III M.D.   On: 08/16/2014 07:25    Dg Chest Port 1 View  08/13/2014   CLINICAL DATA:  Hypoxia, dementia, CHF, COPD, atrial fibrillation, history blood clots  EXAM: PORTABLE CHEST - 1 VIEW  COMPARISON:  Portable exam 0747 hours compared to 08/11/2014  FINDINGS: Rotated to the RIGHT.  Enlargement of cardiac silhouette with pulmonary vascular congestion.  Bibasilar effusions and atelectasis greater on LEFT.  Minimal central peribronchial thickening and question mild perihilar edema, probably little changed when accounting for differences in technique.  No pneumothorax.  Bones demineralized with RIGHT glenohumeral degenerative changes.  IMPRESSION: Enlargement of cardiac silhouette with pulmonary vascular congestion and question mild pulmonary edema, little changed.  Bibasilar effusions and atelectasis LEFT greater than RIGHT.   Electronically Signed   By: [name] M.D.   On: 08/13/2014 08:04   Dg Chest Portable 1 View  08/11/2014   CLINICAL DATA:  Shortness of breath, weakness, lethargy  EXAM: PORTABLE CHEST - 1 VIEW  COMPARISON:  02/22/2014  FINDINGS: Cardiomegaly with pulmonary vascular congestion and possible mild interstitial edema. Suspected layering left pleural effusion. No pneumothorax.  IMPRESSION: Cardiomegaly with possible mild interstitial edema.  Suspected layering left pleural effusion.   Electronically Signed   By: [name] M.D.   On: 08/11/2014 18:26         Subjective: Patient denies fevers, chills, headache, chest pain, dyspnea, nausea, vomiting, diarrhea, abdominal pain, dysuria, hematuria   Objective: Filed Vitals:   08/16/14 2059 08/17/14 0505 08/17/14 0637 08/17/14 1207  BP: 128/64 137/62    Pulse: 86 93    Temp: 98 F (36.7 C) 98 F (36.7 C)    TempSrc: Oral Oral    Resp: 22 21    Height:      Weight:   87.091 kg (192 lb)   SpO2: 95% 95%  95%    Intake/Output Summary (Last 24 hours) at 08/17/14 1702 Last data filed at 08/17/14 1000  Gross per 24 hour  Intake    130 ml  Output       0 ml  Net    130 ml   Weight change: -0.409 kg (-14.4 oz) Exam:   General:  Pt is alert, follows commands appropriately, not in acute distress  HEENT: No icterus, No thrush,  [place]/AT  Cardiovascular: RRR, S1/S2, no rubs, no gallops  Respiratory: Bibasilar crackles, no wheeze  Abdomen: Soft/+BS, non tender, non distended, no guarding  Extremities: 1+LE edema, No lymphangitis, No petechiae, No rashes, no synovitis  Data Reviewed: Basic Metabolic Panel:  Recent Labs Lab 08/13/14 0358 08/14/14 0400 08/15/14 0318 08/16/14 0440 08/17/14 0420  NA 140 139 130* 137 137  K 4.0 4.4 4.3 3.9 3.5  CL 96 94* 93* 92* 84*  CO2 35* 36* 36* 41* 43*  GLUCOSE 164* 140* 163* 105* 120*  BUN 42* 42* 34* 31* 28*  CREATININE 0.58 0.52 0.54 0.59 0.49*  CALCIUM 8.4 8.3* 7.2* 8.0* 7.9*  MG  --   --   --   --  2.1   Liver Function Tests:  Recent Labs Lab 08/11/14 1740 08/12/14 0356  AST 23 22  ALT 15 12  ALKPHOS 89 84  BILITOT 0.8 0.8  PROT 7.0 6.8  ALBUMIN 2.8* 2.6*   No results for input(s): LIPASE, AMYLASE in the last 168 hours.  Recent Labs Lab 08/11/14 2251  AMMONIA 22   CBC:  Recent Labs Lab 08/11/14 1740 08/12/14 0356 08/13/14 0358 08/14/14 0400 08/15/14 0318  WBC 19.7* 12.9* 10.6* 8.4 7.8  NEUTROABS 16.2* 11.6*  --   --   --   HGB 14.3 13.4 13.1 13.1 12.7  HCT 45.9 44.5 42.6 42.3 41.1  MCV 99.6 100.7* 99.5 98.4 97.2  PLT 413* 362 405* 383 364   Cardiac Enzymes: No results for input(s): CKTOTAL, CKMB, CKMBINDEX, TROPONINI in the last 168 hours. BNP: Invalid input(s): POCBNP CBG: No results for input(s): GLUCAP in the last 168 hours.  Recent Results (from the past 240 hour(s))  Culture, blood (routine x 2)     Status: None (Preliminary result)   Collection Time: 08/11/14 10:45 PM  Result Value Ref Range Status   Specimen Description BLOOD L ARM  Final   Special Requests BOTTLES DRAWN AEROBIC AND ANAEROBIC 5ML  Final   Culture   Final           BLOOD  CULTURE RECEIVED NO GROWTH TO DATE CULTURE WILL BE HELD FOR 5 DAYS BEFORE ISSUING A FINAL NEGATIVE REPORT Performed at [facility]    Report Status PENDING  Incomplete  Culture, blood (routine x 2)     Status: None (Preliminary result)   Collection Time: 08/11/14 10:49 PM  Result Value Ref Range Status   Specimen Description BLOOD LEFT ANTECUBITAL  Final   Special Requests BOTTLES DRAWN AEROBIC AND ANAEROBIC 4CC  Final   Culture   Final           BLOOD CULTURE RECEIVED NO GROWTH TO DATE CULTURE WILL BE HELD FOR 5 DAYS BEFORE ISSUING A FINAL NEGATIVE REPORT Performed at [facility]    Report Status PENDING  Incomplete  MRSA PCR Screening     Status: None   Collection Time: 08/12/14 12:04 AM  Result Value Ref Range Status   MRSA by PCR NEGATIVE NEGATIVE Final    Comment:        The GeneXpert MRSA Assay (FDA approved for NASAL specimens only), is one component of a comprehensive MRSA colonization surveillance program. It is not intended to diagnose MRSA infection nor to guide or monitor treatment for MRSA infections.   Culture, Urine     Status: None   Collection Time: 08/13/14 10:43 AM  Result Value Ref Range Status   Specimen Description URINE, CATHETERIZED  Final   Special Requests NONE  Final   Colony Count NO GROWTH Performed at [facility]   Final   Culture NO GROWTH Performed at [facility]   Final   Report Status 08/14/2014 FINAL  Final     Scheduled Meds: . antiseptic oral rinse  7 mL Mouth Rinse q12n4p  . budesonide-formoterol  2 puff Inhalation BID  . ceFEPime (MAXIPIME) IV  1 g Intravenous Q12H  . chlorhexidine  15 mL Mouth Rinse BID  . digoxin  0.125 mg Oral Q48H  . diltiazem  120 mg Oral Daily  . divalproex  125 mg Oral BID  . furosemide  80 mg Intravenous BID  . metolazone  2.5 mg Oral Daily  . rivaroxaban  20 mg Oral Q24H  . sodium chloride  3 mL Intravenous Q12H   Continuous Infusions:    Burnard Enis,  DO  [facility] Pager [phone]  If 7PM-7AM, please contact night-coverage [email or web address] Password [email or web address] 08/17/2014, 5:02 PM   LOS: 6 days

## 2014-08-18 DIAGNOSIS — I482 Chronic atrial fibrillation, unspecified: Secondary | ICD-10-CM | POA: Insufficient documentation

## 2014-08-18 LAB — BLOOD GAS, ARTERIAL
Acid-Base Excess: 23.9 mmol/L — ABNORMAL HIGH (ref 0.0–2.0)
BICARBONATE: 52.5 meq/L — AB (ref 20.0–24.0)
Drawn by: 276051
O2 Content: 2 L/min
O2 SAT: 92.3 %
PO2 ART: 59.9 mmHg — AB (ref 80.0–100.0)
Patient temperature: 98.6
TCO2: 44 mmol/L (ref 0–100)
pCO2 arterial: 61.9 mmHg (ref 35.0–45.0)
pH, Arterial: 7.538 — ABNORMAL HIGH (ref 7.350–7.450)

## 2014-08-18 LAB — CULTURE, BLOOD (ROUTINE X 2)
Culture: NO GROWTH
Culture: NO GROWTH

## 2014-08-18 LAB — BASIC METABOLIC PANEL
ANION GAP: 12 (ref 5–15)
BUN: 31 mg/dL — ABNORMAL HIGH (ref 6–23)
CO2: 46 mmol/L — AB (ref 19–32)
Calcium: 8.2 mg/dL — ABNORMAL LOW (ref 8.4–10.5)
Chloride: 79 mmol/L — ABNORMAL LOW (ref 96–112)
Creatinine, Ser: 0.58 mg/dL (ref 0.50–1.10)
GFR calc Af Amer: >90 mL/min (ref >90)
GFR calc non Af Amer: 80 mL/min — ABNORMAL LOW (ref >90)
GLUCOSE: 142 mg/dL — AB (ref 70–99)
Potassium: 3.2 mmol/L — ABNORMAL LOW (ref 3.5–5.1)
Sodium: 137 mmol/L (ref 135–145)

## 2014-08-18 LAB — MAGNESIUM: Magnesium: 2.1 mg/dL (ref 1.5–2.5)

## 2014-08-18 MED ORDER — FUROSEMIDE 40 MG PO TABS
80.0000 mg | ORAL_TABLET | Freq: Two times a day (BID) | ORAL | Status: DC
  Administered 2014-08-19 – 2014-08-20 (×3): 80 mg via ORAL
  Filled 2014-08-18 (×3): qty 2
  Filled 2014-08-18 (×2): qty 4

## 2014-08-18 MED ORDER — POTASSIUM CHLORIDE CRYS ER 20 MEQ PO TBCR
40.0000 meq | EXTENDED_RELEASE_TABLET | Freq: Once | ORAL | Status: AC
  Administered 2014-08-18: 40 meq via ORAL
  Filled 2014-08-18: qty 2

## 2014-08-18 NOTE — Progress Notes (Signed)
CRITICAL VALUE ALERT  Critical value received:  CO2   -  61.9  Date of notification:  08/18/2014  Time of notification:  0762  Critical value read back: yes  Nurse who received alert:  Othella Boyer, RN  MD notified (1st page):  Dr. Carles Collet  Time of first page:  1657  MD notified (2nd page):  Time of second page:  Responding MD:  Dr. Carles Collet  Time MD responded:  [phone]  Verbally communicated value to MD on unit. MD to go to room to speak with family about results.

## 2014-08-18 NOTE — Progress Notes (Signed)
PROGRESS NOTE  Jennifer Nielsen WVP:[id number] DOB: [birth year] DOA: 08/11/2014 PCP: [name], MD  Assessment/Plan: UTI with Sepsis There was also concern about possible respiratory infection as well. She was apparently hypoxic in the emergency department. It appears that a urine culture was not sent at the time of admission. -as expected urine culture is neg Blood cultures are neg. -Blood pressure stable. Lactic acid is normal. WBC is better.  -d/c vanco, continue cefepime D#7/7  Acute encephalopathy 08/18/14--more somnolent today -08/18/14 repeat ABG -check VPA level, digoxin level, ammonia -TSH4.094 CT head showed brain atrophy but nothing else that was acute.  Her altered mental status is most likely secondary to acute infection and hypercapnea.  Hypercapnia on ABG is most likely due to her history of COPD and is chronic. Her pH is normal. -night time CPAP--pt is tolerating  Acute on chronic diastolic CHF Has not required BiPAP overnight. Continue with intravenous Lasix. Strict ins and outs.  -neg 3.2L prior to foley removal -question accuracy of weights -3/4--discussed with daughter and reviewed MAR from SNF--pt was getting torsemide and furosemide -continue IV lasix-->increased to 80mg  bid -pt takes lasix 40mg  po bid at home -repeat echo--EF 50-55%. No WMA -restarted home metolazone -3/3--repeat CXR--no appreciable change -BNP 147  Acute respiratory failure with hypoxia and hypercarbia -secondary to CHF.Continue Lasix. -weaned to 2L during the day  History of COPD As above. Continue inhalers D/c solumedrol--no wheezing  History of atrial fibrillation Continue xarelto. Continue diltiazem. Monitor on telemetry.  -HR is controlled -restart home dose digoxin  Hypokalemia -replete  Questionable history of dementia Daughter did not admit to the same. Previous notes have suggested this condition. She is on Depakote. Levels were nontoxic. She has generalized  weakness. PT and OT to follow.   Family Communication:   Daughters updated at beside--total time 35 min, >50% spent spent counseling and coordinating care Disposition Plan:   SNF when medically stable    Procedures/Studies: Ct Head Wo Contrast  08/12/2014   CLINICAL DATA:  Acute encephalopathy.  History of dementia.  EXAM: CT HEAD WITHOUT CONTRAST  TECHNIQUE: Contiguous axial images were obtained from the base of the skull through the vertex without intravenous contrast.  COMPARISON:  04/28/2010  FINDINGS: Skull and Sinuses:Negative for fracture or destructive process. There are small nonobstructive osteomas within the left sphenoid and right ethmoid sinuses.  Orbits: No acute abnormality.  Brain: No evidence of acute infarction, hemorrhage, hydrocephalus, or mass lesion/mass effect. Generalized brain atrophy which is fairly typical for age, but new from 2011. Small vessel ischemic change which is mild and stable -gliosis best visualized around the frontal horns of the lateral ventricles.  IMPRESSION: 1. No acute intracranial disease. 2. Brain atrophy which has developed since 2011.   Electronically Signed   By: [name] M.D.   On: 08/12/2014 00:05   Dg Chest Port 1 View  08/16/2014   CLINICAL DATA:  Shortness of Breath  EXAM: PORTABLE CHEST - 1 VIEW  COMPARISON:  August 13, 2014  FINDINGS: There is persistent cardiomegaly with pulmonary vascular within normal limits. Bilateral effusions remain. Consolidation in both lower lobes is stable. No new opacity is apparent. Bones are osteoporotic. There old healed rib fractures on the right. There is arthropathy in both shoulders, apparently more severe on the right than on the left.  IMPRESSION: Cardiomegaly with bilateral effusions. Suspect a degree of congestive heart failure. Question alveolar edema versus pneumonia in the bases. Both entities may  exist concurrently. There is no appreciable change compared to recent prior study.   Electronically Signed    By: Lowella Grip III M.D.   On: 08/16/2014 07:25   Dg Chest Port 1 View  08/13/2014   CLINICAL DATA:  Hypoxia, dementia, CHF, COPD, atrial fibrillation, history blood clots  EXAM: PORTABLE CHEST - 1 VIEW  COMPARISON:  Portable exam 0747 hours compared to 08/11/2014  FINDINGS: Rotated to the RIGHT.  Enlargement of cardiac silhouette with pulmonary vascular congestion.  Bibasilar effusions and atelectasis greater on LEFT.  Minimal central peribronchial thickening and question mild perihilar edema, probably little changed when accounting for differences in technique.  No pneumothorax.  Bones demineralized with RIGHT glenohumeral degenerative changes.  IMPRESSION: Enlargement of cardiac silhouette with pulmonary vascular congestion and question mild pulmonary edema, little changed.  Bibasilar effusions and atelectasis LEFT greater than RIGHT.   Electronically Signed   By: [name] M.D.   On: 08/13/2014 08:04   Dg Chest Portable 1 View  08/11/2014   CLINICAL DATA:  Shortness of breath, weakness, lethargy  EXAM: PORTABLE CHEST - 1 VIEW  COMPARISON:  02/22/2014  FINDINGS: Cardiomegaly with pulmonary vascular congestion and possible mild interstitial edema. Suspected layering left pleural effusion. No pneumothorax.  IMPRESSION: Cardiomegaly with possible mild interstitial edema.  Suspected layering left pleural effusion.   Electronically Signed   By: [name] M.D.   On: 08/11/2014 18:26         Subjective: Patient is more somnolent today. She arouses to repeated stimulation. She is able to tummy name. No other reports of vomiting, diarrhea, respiratory distress, uncontrolled pain.   Objective: Filed Vitals:   08/18/14 0405 08/18/14 0503 08/18/14 0939 08/18/14 1422  BP: 133/79   125/55  Pulse: 94   72  Temp: 98.5 F (36.9 C)   97.8 F (36.6 C)  TempSrc: Oral   Oral  Resp: 24   20  Height:      Weight:  86.183 kg (190 lb)    SpO2: 91%  95% 94%    Intake/Output Summary (Last  24 hours) at 08/18/14 1531 Last data filed at 08/18/14 1422  Gross per 24 hour  Intake    523 ml  Output      0 ml  Net    523 ml   Weight change: -0.907 kg (-2 lb) Exam:   General:  Pt is alert, follows commands appropriately, not in acute distress  HEENT: No icterus, No thrush,[place]/AT  Cardiovascular: RRR, S1/S2, no rubs, no gallops  Respiratory: Fine bibasilar crackles. No wheeze. Good air movement  Abdomen: Soft/+BS, non tender, non distended, no guarding  Extremities: trace LE edema, No lymphangitis, No petechiae, No rashes, no synovitis  Data Reviewed: Basic Metabolic Panel:  Recent Labs Lab 08/14/14 0400 08/15/14 0318 08/16/14 0440 08/17/14 0420 08/18/14 0415  NA 139 130* 137 137 137  K 4.4 4.3 3.9 3.5 3.2*  CL 94* 93* 92* 84* 79*  CO2 36* 36* 41* 43* 46*  GLUCOSE 140* 163* 105* 120* 142*  BUN 42* 34* 31* 28* 31*  CREATININE 0.52 0.54 0.59 0.49* 0.58  CALCIUM 8.3* 7.2* 8.0* 7.9* 8.2*  MG  --   --   --  2.1 2.1   Liver Function Tests:  Recent Labs Lab 08/11/14 1740 08/12/14 0356  AST 23 22  ALT 15 12  ALKPHOS 89 84  BILITOT 0.8 0.8  PROT 7.0 6.8  ALBUMIN 2.8* 2.6*   No results for input(s): LIPASE, AMYLASE  in the last 168 hours.  Recent Labs Lab 08/11/14 2251  AMMONIA 22   CBC:  Recent Labs Lab 08/11/14 1740 08/12/14 0356 08/13/14 0358 08/14/14 0400 08/15/14 0318  WBC 19.7* 12.9* 10.6* 8.4 7.8  NEUTROABS 16.2* 11.6*  --   --   --   HGB 14.3 13.4 13.1 13.1 12.7  HCT 45.9 44.5 42.6 42.3 41.1  MCV 99.6 100.7* 99.5 98.4 97.2  PLT 413* 362 405* 383 364   Cardiac Enzymes: No results for input(s): CKTOTAL, CKMB, CKMBINDEX, TROPONINI in the last 168 hours. BNP: Invalid input(s): POCBNP CBG: No results for input(s): GLUCAP in the last 168 hours.  Recent Results (from the past 240 hour(s))  Culture, blood (routine x 2)     Status: None   Collection Time: 08/11/14 10:45 PM  Result Value Ref Range Status   Specimen Description BLOOD L  ARM  Final   Special Requests BOTTLES DRAWN AEROBIC AND ANAEROBIC 5ML  Final   Culture   Final    NO GROWTH 5 DAYS Performed at [facility]    Report Status 08/18/2014 FINAL  Final  Culture, blood (routine x 2)     Status: None   Collection Time: 08/11/14 10:49 PM  Result Value Ref Range Status   Specimen Description BLOOD LEFT ANTECUBITAL  Final   Special Requests BOTTLES DRAWN AEROBIC AND ANAEROBIC 4CC  Final   Culture   Final    NO GROWTH 5 DAYS Performed at [facility]    Report Status 08/18/2014 FINAL  Final  MRSA PCR Screening     Status: None   Collection Time: 08/12/14 12:04 AM  Result Value Ref Range Status   MRSA by PCR NEGATIVE NEGATIVE Final    Comment:        The GeneXpert MRSA Assay (FDA approved for NASAL specimens only), is one component of a comprehensive MRSA colonization surveillance program. It is not intended to diagnose MRSA infection nor to guide or monitor treatment for MRSA infections.   Culture, Urine     Status: None   Collection Time: 08/13/14 10:43 AM  Result Value Ref Range Status   Specimen Description URINE, CATHETERIZED  Final   Special Requests NONE  Final   Colony Count NO GROWTH Performed at [facility]   Final   Culture NO GROWTH Performed at [facility]   Final   Report Status 08/14/2014 FINAL  Final     Scheduled Meds: . antiseptic oral rinse  7 mL Mouth Rinse q12n4p  . budesonide-formoterol  2 puff Inhalation BID  . ceFEPime (MAXIPIME) IV  1 g Intravenous Q12H  . chlorhexidine  15 mL Mouth Rinse BID  . digoxin  0.125 mg Oral Q48H  . diltiazem  120 mg Oral Daily  . divalproex  125 mg Oral BID  . furosemide  80 mg Intravenous BID  . metolazone  2.5 mg Oral Daily  . rivaroxaban  20 mg Oral Q24H  . sodium chloride  3 mL Intravenous Q12H   Continuous Infusions:    Jennifer Kever, DO  [facility] Pager [phone]  If 7PM-7AM, please contact night-coverage [email or web address] Password  [email or web address] 08/18/2014, 3:31 PM   LOS: 7 days

## 2014-08-18 NOTE — Plan of Care (Signed)
Problem: Phase I Progression Outcomes Goal: Progress activity as tolerated unless otherwise ordered Outcome: Not Progressing Pt wheelchair bound from SNF

## 2014-08-19 LAB — CBC
HCT: 44.6 % (ref 36.0–46.0)
Hemoglobin: 13.8 g/dL (ref 12.0–15.0)
MCH: 29.9 pg (ref 26.0–34.0)
MCHC: 30.9 g/dL (ref 30.0–36.0)
MCV: 96.7 fL (ref 78.0–100.0)
PLATELETS: 282 10*3/uL (ref 150–400)
RBC: 4.61 MIL/uL (ref 3.87–5.11)
RDW: 15.7 % — ABNORMAL HIGH (ref 11.5–15.5)
WBC: 11.5 10*3/uL — ABNORMAL HIGH (ref 4.0–10.5)

## 2014-08-19 LAB — BLOOD GAS, ARTERIAL
Acid-Base Excess: 23.6 mmol/L — ABNORMAL HIGH (ref 0.0–2.0)
BICARBONATE: 52.5 meq/L — AB (ref 20.0–24.0)
DRAWN BY: 295031
O2 CONTENT: 3 L/min
O2 Saturation: 95.3 %
PCO2 ART: 67.1 mmHg — AB (ref 35.0–45.0)
PH ART: 7.505 — AB (ref 7.350–7.450)
Patient temperature: 98.6
TCO2: 44.8 mmol/L (ref 0–100)
pO2, Arterial: 75.1 mmHg — ABNORMAL LOW (ref 80.0–100.0)

## 2014-08-19 LAB — BASIC METABOLIC PANEL
Anion gap: 10 (ref 5–15)
BUN: 28 mg/dL — AB (ref 6–23)
CALCIUM: 8 mg/dL — AB (ref 8.4–10.5)
CO2: 48 mmol/L (ref 19–32)
Chloride: 74 mmol/L — ABNORMAL LOW (ref 96–112)
Creatinine, Ser: 0.56 mg/dL (ref 0.50–1.10)
GFR, EST NON AFRICAN AMERICAN: 81 mL/min — AB (ref >90)
Glucose, Bld: 117 mg/dL — ABNORMAL HIGH (ref 70–99)
Potassium: 3 mmol/L — ABNORMAL LOW (ref 3.5–5.1)
Sodium: 132 mmol/L — ABNORMAL LOW (ref 135–145)

## 2014-08-19 LAB — VALPROIC ACID LEVEL: Valproic Acid Lvl: 32.6 ug/mL — ABNORMAL LOW (ref 50.0–100.0)

## 2014-08-19 LAB — DIGOXIN LEVEL: Digoxin Level: 0.2 ng/mL — ABNORMAL LOW (ref 0.8–2.0)

## 2014-08-19 LAB — AMMONIA: AMMONIA: 27 umol/L (ref 11–32)

## 2014-08-19 MED ORDER — POTASSIUM CHLORIDE CRYS ER 20 MEQ PO TBCR
40.0000 meq | EXTENDED_RELEASE_TABLET | Freq: Four times a day (QID) | ORAL | Status: AC
  Administered 2014-08-19 (×2): 40 meq via ORAL
  Filled 2014-08-19 (×2): qty 2

## 2014-08-19 NOTE — Clinical Social Work Peds Assess (Signed)
CSW has updated [facility] where patient was prior to admission- plan is for return to SNF at d/c. Eduard Clos, MSW, [place]

## 2014-08-19 NOTE — Progress Notes (Signed)
PROGRESS NOTE  Jennifer Nielsen PYK:[id number] DOB: 1924/11/30 DOA: 08/11/2014 PCP: [name], MD  Assessment/Plan: UTI with Sepsis There was also concern about possible respiratory infection as well. She was apparently hypoxic in the emergency department. It appears that a urine culture was not sent at the time of admission. -as expected urine culture is neg Blood cultures are neg. -Blood pressure stable. Lactic acid is normal. WBC is better.  -d/c vanco, continue cefepime D#7/7--finished on 08/18/14  Acute encephalopathy -patient has had "good and bad days" but overall better than time of admission -08/18/14 repeat ABG -check VPA level, digoxin level, ammonia -TSH4.094 CT head showed brain atrophy but nothing else that was acute.  -Her altered mental status is most likely secondary to acute infection -Hypercapnia on ABG is most likely due to her history of COPD and is chronic. Her pH is normal. -night time CPAP--pt is tolerating -ammonia--27 -valproic acid level--32.6   Acute on chronic diastolic CHF Has not required BiPAP overnight. Continue with intravenous Lasix. Strict ins and outs.  -neg 3.2L prior to foley removal -question accuracy of weights -3/4--discussed with daughter and reviewed MAR from SNF--pt was getting torsemide and furosemide -continue IV lasix-->increased to 80mg  bid -08/19/14 switched to po lasix -repeat echo--EF 50-55%. No WMA -restarted home metolazone -3/3--repeat CXR--no appreciable change -BNP 147  Acute respiratory failure with hypoxia and hypercarbia -secondary to CHF.Continue Lasix. -weaned to 3L during the day---stable  History of COPD As above. Continue inhalers D/c solumedrol--no wheezing  History of atrial fibrillation Continue xarelto. Continue diltiazem. Monitor on telemetry.  -HR is controlled -restart home dose digoxin  Hypokalemia -replete  Questionable history of dementia Daughter did not admit to the same. Previous  notes have suggested this condition. She is on Depakote. Levels were nontoxic. She has generalized weakness. PT and OT to follow.   Family Communication: Daughters updated at beside Disposition Plan: SNF when medically stable     Procedures/Studies: Ct Head Wo Contrast  08/12/2014   CLINICAL DATA:  Acute encephalopathy.  History of dementia.  EXAM: CT HEAD WITHOUT CONTRAST  TECHNIQUE: Contiguous axial images were obtained from the base of the skull through the vertex without intravenous contrast.  COMPARISON:  04/28/2010  FINDINGS: Skull and Sinuses:Negative for fracture or destructive process. There are small nonobstructive osteomas within the left sphenoid and right ethmoid sinuses.  Orbits: No acute abnormality.  Brain: No evidence of acute infarction, hemorrhage, hydrocephalus, or mass lesion/mass effect. Generalized brain atrophy which is fairly typical for age, but new from 2011. Small vessel ischemic change which is mild and stable -gliosis best visualized around the frontal horns of the lateral ventricles.  IMPRESSION: 1. No acute intracranial disease. 2. Brain atrophy which has developed since 2011.   Electronically Signed   By: [name] M.D.   On: 08/12/2014 00:05   Dg Chest Port 1 View  08/16/2014   CLINICAL DATA:  Shortness of Breath  EXAM: PORTABLE CHEST - 1 VIEW  COMPARISON:  August 13, 2014  FINDINGS: There is persistent cardiomegaly with pulmonary vascular within normal limits. Bilateral effusions remain. Consolidation in both lower lobes is stable. No new opacity is apparent. Bones are osteoporotic. There old healed rib fractures on the right. There is arthropathy in both shoulders, apparently more severe on the right than on the left.  IMPRESSION: Cardiomegaly with bilateral effusions. Suspect a degree of congestive heart failure. Question alveolar edema versus pneumonia in the bases. Both entities may exist  concurrently. There is no appreciable change compared to recent prior  study.   Electronically Signed   By: Lowella Grip III M.D.   On: 08/16/2014 07:25   Dg Chest Port 1 View  08/13/2014   CLINICAL DATA:  Hypoxia, dementia, CHF, COPD, atrial fibrillation, history blood clots  EXAM: PORTABLE CHEST - 1 VIEW  COMPARISON:  Portable exam 0747 hours compared to 08/11/2014  FINDINGS: Rotated to the RIGHT.  Enlargement of cardiac silhouette with pulmonary vascular congestion.  Bibasilar effusions and atelectasis greater on LEFT.  Minimal central peribronchial thickening and question mild perihilar edema, probably little changed when accounting for differences in technique.  No pneumothorax.  Bones demineralized with RIGHT glenohumeral degenerative changes.  IMPRESSION: Enlargement of cardiac silhouette with pulmonary vascular congestion and question mild pulmonary edema, little changed.  Bibasilar effusions and atelectasis LEFT greater than RIGHT.   Electronically Signed   By: [name] M.D.   On: 08/13/2014 08:04   Dg Chest Portable 1 View  08/11/2014   CLINICAL DATA:  Shortness of breath, weakness, lethargy  EXAM: PORTABLE CHEST - 1 VIEW  COMPARISON:  02/22/2014  FINDINGS: Cardiomegaly with pulmonary vascular congestion and possible mild interstitial edema. Suspected layering left pleural effusion. No pneumothorax.  IMPRESSION: Cardiomegaly with possible mild interstitial edema.  Suspected layering left pleural effusion.   Electronically Signed   By: [name] M.D.   On: 08/11/2014 18:26         Subjective: Patient denies fevers, chills, headache, chest pain, dyspnea, nausea, vomiting, diarrhea, abdominal pain, dysuria, hematuria   Objective: Filed Vitals:   08/18/14 1943 08/18/14 2040 08/19/14 0600 08/19/14 0824  BP:  106/63 125/60   Pulse:  90 91   Temp:  98.8 F (37.1 C) 97.5 F (36.4 C)   TempSrc:  Oral Axillary   Resp:  20 20   Height:      Weight:      SpO2: 95% 95% 97% 95%    Intake/Output Summary (Last 24 hours) at 08/19/14 1207 Last  data filed at 08/19/14 0816  Gross per 24 hour  Intake    590 ml  Output      2 ml  Net    588 ml   Weight change:  Exam:   General:  Pt is alert, follows commands appropriately, not in acute distress  HEENT: No icterus, No thrush, [place]/AT  Cardiovascular: IRRR, S1/S2, no rubs, no gallops  Respiratory: CTA bilaterally, no wheezing  Abdomen: Soft/+BS, non tender, non distended, no guarding  Extremities: No edema, No lymphangitis, No petechiae, No rashes, no synovitis  Data Reviewed: Basic Metabolic Panel:  Recent Labs Lab 08/15/14 0318 08/16/14 0440 08/17/14 0420 08/18/14 0415 08/19/14 0510  NA 130* 137 137 137 132*  K 4.3 3.9 3.5 3.2* 3.0*  CL 93* 92* 84* 79* 74*  CO2 36* 41* 43* 46* 48*  GLUCOSE 163* 105* 120* 142* 117*  BUN 34* 31* 28* 31* 28*  CREATININE 0.54 0.59 0.49* 0.58 0.56  CALCIUM 7.2* 8.0* 7.9* 8.2* 8.0*  MG  --   --  2.1 2.1  --    Liver Function Tests: No results for input(s): AST, ALT, ALKPHOS, BILITOT, PROT, ALBUMIN in the last 168 hours. No results for input(s): LIPASE, AMYLASE in the last 168 hours.  Recent Labs Lab 08/19/14 0510  AMMONIA 27   CBC:  Recent Labs Lab 08/13/14 0358 08/14/14 0400 08/15/14 0318 08/19/14 0510  WBC 10.6* 8.4 7.8 11.5*  HGB 13.1 13.1 12.7 13.8  HCT 42.6 42.3 41.1 44.6  MCV 99.5 98.4 97.2 96.7  PLT 405* 383 364 282   Cardiac Enzymes: No results for input(s): CKTOTAL, CKMB, CKMBINDEX, TROPONINI in the last 168 hours. BNP: Invalid input(s): POCBNP CBG: No results for input(s): GLUCAP in the last 168 hours.  Recent Results (from the past 240 hour(s))  Culture, blood (routine x 2)     Status: None   Collection Time: 08/11/14 10:45 PM  Result Value Ref Range Status   Specimen Description BLOOD L ARM  Final   Special Requests BOTTLES DRAWN AEROBIC AND ANAEROBIC 5ML  Final   Culture   Final    NO GROWTH 5 DAYS Performed at [facility]    Report Status 08/18/2014 FINAL  Final  Culture, blood  (routine x 2)     Status: None   Collection Time: 08/11/14 10:49 PM  Result Value Ref Range Status   Specimen Description BLOOD LEFT ANTECUBITAL  Final   Special Requests BOTTLES DRAWN AEROBIC AND ANAEROBIC 4CC  Final   Culture   Final    NO GROWTH 5 DAYS Performed at [facility]    Report Status 08/18/2014 FINAL  Final  MRSA PCR Screening     Status: None   Collection Time: 08/12/14 12:04 AM  Result Value Ref Range Status   MRSA by PCR NEGATIVE NEGATIVE Final    Comment:        The GeneXpert MRSA Assay (FDA approved for NASAL specimens only), is one component of a comprehensive MRSA colonization surveillance program. It is not intended to diagnose MRSA infection nor to guide or monitor treatment for MRSA infections.   Culture, Urine     Status: None   Collection Time: 08/13/14 10:43 AM  Result Value Ref Range Status   Specimen Description URINE, CATHETERIZED  Final   Special Requests NONE  Final   Colony Count NO GROWTH Performed at [facility]   Final   Culture NO GROWTH Performed at [facility]   Final   Report Status 08/14/2014 FINAL  Final     Scheduled Meds: . antiseptic oral rinse  7 mL Mouth Rinse q12n4p  . budesonide-formoterol  2 puff Inhalation BID  . chlorhexidine  15 mL Mouth Rinse BID  . digoxin  0.125 mg Oral Q48H  . diltiazem  120 mg Oral Daily  . furosemide  80 mg Oral BID  . metolazone  2.5 mg Oral Daily  . rivaroxaban  20 mg Oral Q24H  . sodium chloride  3 mL Intravenous Q12H   Continuous Infusions:    Debrina Kizer, DO  [facility] Pager [phone]  If 7PM-7AM, please contact night-coverage [email or web address] Password [email or web address] 08/19/2014, 12:07 PM   LOS: 8 days

## 2014-08-19 NOTE — Progress Notes (Signed)
Patient communicates with head nods of yes and no. She declines the assistance with her mdi when using an aerochamber, but allows assistance and will follow instruction when not using the spaer chamber. She is also not agreeable to the use of nocturnal CPAP at this time and nods her head no when asked if she wears a CPAP or other mask at home during sleep. Per previous notes by RT, family has asked that patient not be placed on CPAP. RT will continue to follow.

## 2014-08-19 NOTE — Progress Notes (Signed)
Rpt ABG results , MD aware, seen patient.

## 2014-08-19 NOTE — Progress Notes (Addendum)
Per report from RT [place], Pt's family requested that she not be placed on CPAP QHS tonight.  RT will monitor and assess as needed. RN aware.

## 2014-08-19 NOTE — Progress Notes (Addendum)
Shift Event: Elevated CO2 of 48, other VSS, pt asymptomatic with no sob.  Pt is DNR, w/ PMH of COPD, has been hypercarbic this hospitalization, likely secondary to chronic CO2 retention.  RN relates that family is seeking comfort care, however, unsure if family would want pt placed on BIPAP or not. Will order ABG.    Lacy Duverney [facility]

## 2014-08-19 NOTE — Progress Notes (Signed)
CRITICAL VALUE ALERT  Critical value received:  CO2 48  Date of notification:  08/19/14  Time of notification:  0615  Critical value read back:Yes.    Nurse who received alert:  Virgina Norfolk, RN  MD notified (1st page):  Alene Mires  Time of first page:  0618  MD notified (2nd page):  Time of second page:  Responding MD:  Alene Mires  Time MD responded:  [phone]

## 2014-08-20 DIAGNOSIS — G9340 Encephalopathy, unspecified: Secondary | ICD-10-CM | POA: Diagnosis not present

## 2014-08-20 DIAGNOSIS — E876 Hypokalemia: Secondary | ICD-10-CM | POA: Diagnosis present

## 2014-08-20 DIAGNOSIS — J811 Chronic pulmonary edema: Secondary | ICD-10-CM | POA: Diagnosis not present

## 2014-08-20 DIAGNOSIS — R1312 Dysphagia, oropharyngeal phase: Secondary | ICD-10-CM | POA: Diagnosis not present

## 2014-08-20 DIAGNOSIS — I482 Chronic atrial fibrillation: Secondary | ICD-10-CM | POA: Diagnosis not present

## 2014-08-20 DIAGNOSIS — R41841 Cognitive communication deficit: Secondary | ICD-10-CM | POA: Diagnosis not present

## 2014-08-20 DIAGNOSIS — Z66 Do not resuscitate: Secondary | ICD-10-CM | POA: Diagnosis present

## 2014-08-20 DIAGNOSIS — J9621 Acute and chronic respiratory failure with hypoxia: Secondary | ICD-10-CM | POA: Diagnosis present

## 2014-08-20 DIAGNOSIS — J441 Chronic obstructive pulmonary disease with (acute) exacerbation: Secondary | ICD-10-CM | POA: Diagnosis not present

## 2014-08-20 DIAGNOSIS — I5032 Chronic diastolic (congestive) heart failure: Secondary | ICD-10-CM | POA: Diagnosis not present

## 2014-08-20 DIAGNOSIS — I4891 Unspecified atrial fibrillation: Secondary | ICD-10-CM | POA: Diagnosis not present

## 2014-08-20 DIAGNOSIS — J90 Pleural effusion, not elsewhere classified: Secondary | ICD-10-CM | POA: Diagnosis present

## 2014-08-20 DIAGNOSIS — Z91018 Allergy to other foods: Secondary | ICD-10-CM | POA: Diagnosis not present

## 2014-08-20 DIAGNOSIS — D72829 Elevated white blood cell count, unspecified: Secondary | ICD-10-CM | POA: Diagnosis not present

## 2014-08-20 DIAGNOSIS — Z79899 Other long term (current) drug therapy: Secondary | ICD-10-CM | POA: Diagnosis not present

## 2014-08-20 DIAGNOSIS — Z87891 Personal history of nicotine dependence: Secondary | ICD-10-CM | POA: Diagnosis not present

## 2014-08-20 DIAGNOSIS — Z7901 Long term (current) use of anticoagulants: Secondary | ICD-10-CM | POA: Diagnosis not present

## 2014-08-20 DIAGNOSIS — Z888 Allergy status to other drugs, medicaments and biological substances status: Secondary | ICD-10-CM | POA: Diagnosis not present

## 2014-08-20 DIAGNOSIS — I5033 Acute on chronic diastolic (congestive) heart failure: Secondary | ICD-10-CM | POA: Diagnosis not present

## 2014-08-20 DIAGNOSIS — M1990 Unspecified osteoarthritis, unspecified site: Secondary | ICD-10-CM | POA: Diagnosis present

## 2014-08-20 DIAGNOSIS — Z880 Allergy status to penicillin: Secondary | ICD-10-CM | POA: Diagnosis not present

## 2014-08-20 DIAGNOSIS — R0602 Shortness of breath: Secondary | ICD-10-CM | POA: Diagnosis not present

## 2014-08-20 DIAGNOSIS — J9601 Acute respiratory failure with hypoxia: Secondary | ICD-10-CM | POA: Diagnosis not present

## 2014-08-20 DIAGNOSIS — Z803 Family history of malignant neoplasm of breast: Secondary | ICD-10-CM | POA: Diagnosis not present

## 2014-08-20 DIAGNOSIS — A419 Sepsis, unspecified organism: Secondary | ICD-10-CM | POA: Diagnosis not present

## 2014-08-20 DIAGNOSIS — Z882 Allergy status to sulfonamides status: Secondary | ICD-10-CM | POA: Diagnosis not present

## 2014-08-20 DIAGNOSIS — R6889 Other general symptoms and signs: Secondary | ICD-10-CM | POA: Diagnosis not present

## 2014-08-20 DIAGNOSIS — E785 Hyperlipidemia, unspecified: Secondary | ICD-10-CM | POA: Diagnosis present

## 2014-08-20 DIAGNOSIS — I509 Heart failure, unspecified: Secondary | ICD-10-CM | POA: Diagnosis not present

## 2014-08-20 DIAGNOSIS — M6281 Muscle weakness (generalized): Secondary | ICD-10-CM | POA: Diagnosis not present

## 2014-08-20 DIAGNOSIS — Z881 Allergy status to other antibiotic agents status: Secondary | ICD-10-CM | POA: Diagnosis not present

## 2014-08-20 DIAGNOSIS — J9610 Chronic respiratory failure, unspecified whether with hypoxia or hypercapnia: Secondary | ICD-10-CM | POA: Diagnosis not present

## 2014-08-20 DIAGNOSIS — K219 Gastro-esophageal reflux disease without esophagitis: Secondary | ICD-10-CM | POA: Diagnosis present

## 2014-08-20 DIAGNOSIS — N390 Urinary tract infection, site not specified: Secondary | ICD-10-CM | POA: Diagnosis not present

## 2014-08-20 DIAGNOSIS — F0390 Unspecified dementia without behavioral disturbance: Secondary | ICD-10-CM | POA: Diagnosis present

## 2014-08-20 DIAGNOSIS — J449 Chronic obstructive pulmonary disease, unspecified: Secondary | ICD-10-CM | POA: Diagnosis not present

## 2014-08-20 DIAGNOSIS — R5381 Other malaise: Secondary | ICD-10-CM | POA: Diagnosis not present

## 2014-08-20 DIAGNOSIS — J9600 Acute respiratory failure, unspecified whether with hypoxia or hypercapnia: Secondary | ICD-10-CM | POA: Diagnosis not present

## 2014-08-20 DIAGNOSIS — R278 Other lack of coordination: Secondary | ICD-10-CM | POA: Diagnosis not present

## 2014-08-20 DIAGNOSIS — Z8249 Family history of ischemic heart disease and other diseases of the circulatory system: Secondary | ICD-10-CM | POA: Diagnosis not present

## 2014-08-20 DIAGNOSIS — J9602 Acute respiratory failure with hypercapnia: Secondary | ICD-10-CM | POA: Diagnosis not present

## 2014-08-20 LAB — BASIC METABOLIC PANEL
ANION GAP: 8 (ref 5–15)
BUN: 32 mg/dL — ABNORMAL HIGH (ref 6–23)
CALCIUM: 8.3 mg/dL — AB (ref 8.4–10.5)
CHLORIDE: 76 mmol/L — AB (ref 96–112)
CO2: 49 mmol/L (ref 19–32)
CREATININE: 0.58 mg/dL (ref 0.50–1.10)
GFR, EST NON AFRICAN AMERICAN: 80 mL/min — AB (ref >90)
Glucose, Bld: 104 mg/dL — ABNORMAL HIGH (ref 70–99)
Potassium: 3.6 mmol/L (ref 3.5–5.1)
Sodium: 133 mmol/L — ABNORMAL LOW (ref 135–145)

## 2014-08-20 MED ORDER — FUROSEMIDE 80 MG PO TABS: 80.0000 mg | ORAL_TABLET | Freq: Two times a day (BID) | ORAL | Status: DC

## 2014-08-20 NOTE — Discharge Summary (Signed)
Physician Discharge Summary  Jennifer Nielsen QQP:[id number] DOB: [birth year] DOA: 08/11/2014  PCP: [name], MD  Admit date: 08/11/2014 Discharge date: 08/20/2014  Recommendations for Outpatient Follow-up:  1. Pt will need to follow up with PCP in 2 weeks post discharge 2. Please obtain BMP 08/23/14 3. Maintain pt on 3L [place] and wean for oxygen sat >92%  Discharge Diagnoses:  UTI with Sepsis There was also concern about possible respiratory infection as well. She was apparently hypoxic in the emergency department. It appears that a urine culture was not sent at the time of admission. -as expected urine culture is neg Blood cultures are neg. -Blood pressure stable. Lactic acid is normal. WBC is better.  -d/c vanco, continue cefepime D#7/7--finished on 08/18/14  Acute encephalopathy -patient has had "good and bad days" but overall better than time of admission -On the day of discharge, the patient was A&O x 4 -08/18/14 repeat ABG -digoxin level--0.2 -ammonia--27 -TSH4.094 CT head showed brain atrophy but nothing else that was acute.  -Her altered mental status is most likely secondary to acute infection -Hypercapnia on ABG is most likely due to her history of COPD and is chronic. Her pH is normal. She did ultimately become somewhat hyperkalemic secondary to diuresis and contraction alkalosis. As a result, her diuretics were adjusted.  -night time CPAP--pt is tolerating initially, but family has requested stopping  -ammonia--27 -valproic acid level--32.6  -Depakote was ultimately discontinued after discussion with the patient's family. Patient appeared to be more alert after discontinuation  Acute on chronic diastolic CHF Has not required BiPAP overnight. Continue with intravenous Lasix. Strict ins and outs.  -neg 3.2L prior to foley removal -question accuracy of weights--however weight at the time of discharge 186 pounds (193 lbs at beginning of admit) -3/4--discussed with daughter and  reviewed MAR from SNF--pt was getting torsemide--20mg  bid and furosemide 40bid -continue IV lasix-->increased to 80mg  bid -08/19/14 switched to po lasix--d/c with lasix 80mg  bid -repeat echo--EF 50-55%. No WMA -will not restart metolazone after d/c -3/3--repeat CXR--no appreciable change -BNP 147  Acute respiratory failure with hypoxia and hypercarbia -secondary to CHF.Continue Lasix. -weaned to 3L during the day---stable  History of COPD As above. Continue inhalers D/c solumedrol--no wheezing  History of atrial fibrillation Continue xarelto. Continue diltiazem. Monitor on telemetry.  -HR is controlled -restart home dose digoxin  Hypokalemia -replete  Questionable history of dementia Daughter did not admit to the same. Previous notes have suggested this condition.  Levels were nontoxic. She has generalized weakness. PT and OT to follow-->SNF  Goals of care -The patient was seen by palliative medicine -The patient was changed to DO NOT RESUSCITATE, but continued aggressive care. Discharge Condition: stable  Disposition: SNF  Diet:heart healthy Wt Readings from Last 3 Encounters:  08/20/14 84.46 kg (186 lb 3.2 oz)  06/18/14 92.625 kg (204 lb 3.2 oz)  04/18/14 90.901 kg (200 lb 6.4 oz)    History of present illness:   81 year old Caucasian female with a past medical history of atrial fibrillation, COPD, presents with progressive shortness of breath. She was found to have a UTI along with acute on chronic diastolic heart failure. She was admitted to the hospital for further management. Echocardiogram showed EF 50-55% without wall motion abnormalities. The patient was treated with intravenous furosemide. Her dose was increased and the patient had good clinical response. The patient continued to have waxing and waning episodes of encephalopathy. Workup for reversible causes was essentially unremarkable except for the patient's metabolic derangements and UTI. Her electrolytes  were  optimized. Her UTI was treated appropriately. She finished 7 days of cefepime. Physical therapy consulted. They recommended skilled nursing facility.   Discharge Exam: Filed Vitals:   08/20/14 0828  BP: 124/60  Pulse:   Temp:   Resp:    Filed Vitals:   08/19/14 1637 08/19/14 2258 08/20/14 0549 08/20/14 0828  BP:  119/50 124/60 124/60  Pulse: 88 86 70   Temp:  98.5 F (36.9 C) 97.5 F (36.4 C)   TempSrc:  Oral Oral   Resp:  19 20   Height:      Weight:   84.46 kg (186 lb 3.2 oz)   SpO2:  95% 98%    General: Alert and awake, NAD, pleasant, cooperative Cardiovascular: IRRR, no rub, no gallop, no S3 Respiratory: bibasilar crackles. No wheezes.  Abdomen:soft, nontender, nondistended, positive bowel sounds Extremities: No edema, No lymphangitis, no petechiae  Discharge Instructions      Discharge Instructions    Diet - low sodium heart healthy    Complete by:  As directed      Increase activity slowly    Complete by:  As directed             Medication List    STOP taking these medications        divalproex 125 MG capsule  Commonly known as:  DEPAKOTE SPRINKLE     metolazone 2.5 MG tablet  Commonly known as:  ZAROXOLYN     torsemide 20 MG tablet  Commonly known as:  DEMADEX      TAKE these medications        budesonide-formoterol 160-4.5 MCG/ACT inhaler  Commonly known as:  SYMBICORT  Inhale 2 puffs into the lungs 2 (two) times daily.     CALTRATE 600+D PO  Take 1 tablet by mouth every morning. For calcium supplement     digoxin 0.25 MG tablet  Commonly known as:  LANOXIN  Take 0.25 mg by mouth every other day. Alternate between  0.125 and 0.250     digoxin 0.125 MG tablet  Commonly known as:  LANOXIN  Take 0.125 mg by mouth every other day.     diltiazem 120 MG 24 hr capsule  Commonly known as:  CARDIZEM CD  Take 120 mg by mouth daily. For A-Fib     DSS 100 MG Caps  Take 100 mg by mouth daily. For constipation     furosemide 80 MG tablet    Commonly known as:  LASIX  Take 1 tablet (80 mg total) by mouth 2 (two) times daily.  Start taking on:  08/21/2014     OLANZapine 5 MG tablet  Commonly known as:  ZYPREXA  Take 5 mg by mouth at bedtime.     potassium chloride SA 20 MEQ tablet  Commonly known as:  K-DUR,KLOR-CON  Take 20 mEq by mouth 2 (two) times daily.     SYSTANE OP  Place 1 drop into both eyes 2 (two) times daily. For dry eyes     TYLENOL 500 MG tablet  Generic drug:  acetaminophen  Take 500 mg by mouth 3 (three) times daily.     XARELTO 20 MG Tabs tablet  Generic drug:  rivaroxaban  Take 20 mg by mouth daily. For DVT         The results of significant diagnostics from this hospitalization (including imaging, microbiology, ancillary and laboratory) are listed below for reference.    Significant Diagnostic Studies: Ct Head Wo Contrast  08/12/2014  CLINICAL DATA:  Acute encephalopathy.  History of dementia.  EXAM: CT HEAD WITHOUT CONTRAST  TECHNIQUE: Contiguous axial images were obtained from the base of the skull through the vertex without intravenous contrast.  COMPARISON:  04/28/2010  FINDINGS: Skull and Sinuses:Negative for fracture or destructive process. There are small nonobstructive osteomas within the left sphenoid and right ethmoid sinuses.  Orbits: No acute abnormality.  Brain: No evidence of acute infarction, hemorrhage, hydrocephalus, or mass lesion/mass effect. Generalized brain atrophy which is fairly typical for age, but new from 2011. Small vessel ischemic change which is mild and stable -gliosis best visualized around the frontal horns of the lateral ventricles.  IMPRESSION: 1. No acute intracranial disease. 2. Brain atrophy which has developed since 2011.   Electronically Signed   By: [name] M.D.   On: 08/12/2014 00:05   Dg Chest Port 1 View  08/16/2014   CLINICAL DATA:  Shortness of Breath  EXAM: PORTABLE CHEST - 1 VIEW  COMPARISON:  August 13, 2014  FINDINGS: There is persistent  cardiomegaly with pulmonary vascular within normal limits. Bilateral effusions remain. Consolidation in both lower lobes is stable. No new opacity is apparent. Bones are osteoporotic. There old healed rib fractures on the right. There is arthropathy in both shoulders, apparently more severe on the right than on the left.  IMPRESSION: Cardiomegaly with bilateral effusions. Suspect a degree of congestive heart failure. Question alveolar edema versus pneumonia in the bases. Both entities may exist concurrently. There is no appreciable change compared to recent prior study.   Electronically Signed   By: Lowella Grip III M.D.   On: 08/16/2014 07:25   Dg Chest Port 1 View  08/13/2014   CLINICAL DATA:  Hypoxia, dementia, CHF, COPD, atrial fibrillation, history blood clots  EXAM: PORTABLE CHEST - 1 VIEW  COMPARISON:  Portable exam 0747 hours compared to 08/11/2014  FINDINGS: Rotated to the RIGHT.  Enlargement of cardiac silhouette with pulmonary vascular congestion.  Bibasilar effusions and atelectasis greater on LEFT.  Minimal central peribronchial thickening and question mild perihilar edema, probably little changed when accounting for differences in technique.  No pneumothorax.  Bones demineralized with RIGHT glenohumeral degenerative changes.  IMPRESSION: Enlargement of cardiac silhouette with pulmonary vascular congestion and question mild pulmonary edema, little changed.  Bibasilar effusions and atelectasis LEFT greater than RIGHT.   Electronically Signed   By: [name] M.D.   On: 08/13/2014 08:04   Dg Chest Portable 1 View  08/11/2014   CLINICAL DATA:  Shortness of breath, weakness, lethargy  EXAM: PORTABLE CHEST - 1 VIEW  COMPARISON:  02/22/2014  FINDINGS: Cardiomegaly with pulmonary vascular congestion and possible mild interstitial edema. Suspected layering left pleural effusion. No pneumothorax.  IMPRESSION: Cardiomegaly with possible mild interstitial edema.  Suspected layering left pleural  effusion.   Electronically Signed   By: [name] M.D.   On: 08/11/2014 18:26     Microbiology: Recent Results (from the past 240 hour(s))  Culture, blood (routine x 2)     Status: None   Collection Time: 08/11/14 10:45 PM  Result Value Ref Range Status   Specimen Description BLOOD L ARM  Final   Special Requests BOTTLES DRAWN AEROBIC AND ANAEROBIC 5ML  Final   Culture   Final    NO GROWTH 5 DAYS Performed at [facility]    Report Status 08/18/2014 FINAL  Final  Culture, blood (routine x 2)     Status: None   Collection Time: 08/11/14 10:49 PM  Result Value Ref Range Status   Specimen Description BLOOD LEFT ANTECUBITAL  Final   Special Requests BOTTLES DRAWN AEROBIC AND ANAEROBIC 4CC  Final   Culture   Final    NO GROWTH 5 DAYS Performed at [facility]    Report Status 08/18/2014 FINAL  Final  MRSA PCR Screening     Status: None   Collection Time: 08/12/14 12:04 AM  Result Value Ref Range Status   MRSA by PCR NEGATIVE NEGATIVE Final    Comment:        The GeneXpert MRSA Assay (FDA approved for NASAL specimens only), is one component of a comprehensive MRSA colonization surveillance program. It is not intended to diagnose MRSA infection nor to guide or monitor treatment for MRSA infections.   Culture, Urine     Status: None   Collection Time: 08/13/14 10:43 AM  Result Value Ref Range Status   Specimen Description URINE, CATHETERIZED  Final   Special Requests NONE  Final   Colony Count NO GROWTH Performed at [facility]   Final   Culture NO GROWTH Performed at [facility]   Final   Report Status 08/14/2014 FINAL  Final     Labs: Basic Metabolic Panel:  Recent Labs Lab 08/16/14 0440 08/17/14 0420 08/18/14 0415 08/19/14 0510 08/20/14 0503  NA 137 137 137 132* 133*  K 3.9 3.5 3.2* 3.0* 3.6  CL 92* 84* 79* 74* 76*  CO2 41* 43* 46* 48* 49*  GLUCOSE 105* 120* 142* 117* 104*  BUN 31* 28* 31* 28* 32*  CREATININE  0.59 0.49* 0.58 0.56 0.58  CALCIUM 8.0* 7.9* 8.2* 8.0* 8.3*  MG  --  2.1 2.1  --   --    Liver Function Tests: No results for input(s): AST, ALT, ALKPHOS, BILITOT, PROT, ALBUMIN in the last 168 hours. No results for input(s): LIPASE, AMYLASE in the last 168 hours.  Recent Labs Lab 08/19/14 0510  AMMONIA 27   CBC:  Recent Labs Lab 08/14/14 0400 08/15/14 0318 08/19/14 0510  WBC 8.4 7.8 11.5*  HGB 13.1 12.7 13.8  HCT 42.3 41.1 44.6  MCV 98.4 97.2 96.7  PLT 383 364 282   Cardiac Enzymes: No results for input(s): CKTOTAL, CKMB, CKMBINDEX, TROPONINI in the last 168 hours. BNP: Invalid input(s): POCBNP CBG: No results for input(s): GLUCAP in the last 168 hours.  Time coordinating discharge:  Greater than 30 minutes  Signed:  Leota Maka, DO [facility] Pager: [phone] 08/20/2014, 9:06 AM

## 2014-08-20 NOTE — Progress Notes (Signed)
Report given to Diane

## 2014-08-20 NOTE — Clinical Social Work Note (Signed)
Patient for d/c today to SNF bed at [facility]- daughter, [name], at bedside and  agreeable to this plan- plan transfer via EMS. Eduard Clos, MSW, [place]

## 2014-08-20 NOTE — Progress Notes (Signed)
CRITICAL VALUE ALERT  Critical value received:  CO2 49  Date of notification:  08/20/2014  Time of notification:  05:56  Critical value read back:Yes.    Nurse who received alert:  Ruben Im, RN  MD notified (1st page):  Jennet Maduro, NP  Time of first page:  06:05  MD notified (2nd page):  Time of second page:  Responding MD:    Time MD responded:  Value consistent with previous results.

## 2014-08-21 ENCOUNTER — Non-Acute Institutional Stay (SKILLED_NURSING_FACILITY): Payer: Medicare Other | Admitting: Internal Medicine

## 2014-08-21 DIAGNOSIS — D72829 Elevated white blood cell count, unspecified: Secondary | ICD-10-CM

## 2014-08-21 DIAGNOSIS — N390 Urinary tract infection, site not specified: Secondary | ICD-10-CM

## 2014-08-21 DIAGNOSIS — I482 Chronic atrial fibrillation, unspecified: Secondary | ICD-10-CM

## 2014-08-21 DIAGNOSIS — J9610 Chronic respiratory failure, unspecified whether with hypoxia or hypercapnia: Secondary | ICD-10-CM

## 2014-08-21 DIAGNOSIS — I5032 Chronic diastolic (congestive) heart failure: Secondary | ICD-10-CM | POA: Diagnosis not present

## 2014-08-21 DIAGNOSIS — R5381 Other malaise: Secondary | ICD-10-CM | POA: Diagnosis not present

## 2014-08-21 DIAGNOSIS — G9340 Encephalopathy, unspecified: Secondary | ICD-10-CM

## 2014-08-21 NOTE — Progress Notes (Signed)
Patient ID: Jennifer Nielsen, female   DOB: 1924-10-24, 81 y.o.   MRN: [id number]     [facility]   PCP: DAY,JAMES, MD  Code Status: DNR  Allergies  Allergen Reactions  . Chocolate Itching  . Ciprofloxacin Itching  . Coffee Bean Extract [Coffea Arabica] Itching  . Coffee Flavor Itching  . Gentamycin [Gentamicin] Itching  . Imodium [Loperamide] Other (See Comments)    unknown  . Penicillins Itching  . Sulfa Antibiotics Itching  . Tea Itching    Chief Complaint  Patient presents with  . New Admit To SNF     HPI:  81 year old patient is here for long term care post hospital admission from 08/11/2014- 08/20/2014 with worsening dyspnea. Echocardiogram showed EF 50-55% without wall motion abnormalities. She responded well to iv diuresis. She also had encephalopathy. She was treated with 1 week course of cefepime. depakote was discontinued with concerns of encephalopathy. She has past medical history of atrial fibrillation, COPD, CHF, dementia, HLD among others She is seen in her room today. She is somnolent and opens her eyes to name call. Does not participate in HPI or ROS. As per staff pt has further declined since this hospital admission. Her po intake is poor, does not communicate with staff and is mostly in bed. On review of hospital note, pt was seen by palliative care services in the hospital.  Review of Systems:  Unable to obtain  Past Medical History  Diagnosis Date  . Arrhythmia   . Arthritis   . Atrial fibrillation     Remains stable (As of 05/09/13)  . H/O blood clots   . Hyperlipemia   . Dementia     Remains stable & continues to function adequately in the current living environment with supervision. Has had little changes in behavior (AS OF 05/09/13)  . GERD (gastroesophageal reflux disease)     Stable (As of 05/09/13)  . Osteoarthritis     Denies pain (As of 05/09/13)  . CHF (congestive heart failure)     chronic diastolic  .  COPD (chronic obstructive pulmonary disease)     w/exacerbation   Past Surgical History  Procedure Laterality Date  . Appendectomy    . Gallbladder surgery    . Hip surgery      Right-Metal plate   . Leg surgery      Left leg   Social History:   reports that she has quit smoking. She has never used smokeless tobacco. She reports that she does not drink alcohol or use illicit drugs.  Family History  Problem Relation Age of Onset  . Colon cancer Neg Hx   . Heart disease Father   . Breast cancer Daughter     Medications: Patient's Medications  New Prescriptions   No medications on file  Previous Medications   ACETAMINOPHEN (TYLENOL) 500 MG TABLET    Take 500 mg by mouth 3 (three) times daily.    BUDESONIDE-FORMOTEROL (SYMBICORT) 160-4.5 MCG/ACT INHALER    Inhale 2 puffs into the lungs 2 (two) times daily.   CALCIUM CARBONATE-VITAMIN D (CALTRATE 600+D PO)    Take 1 tablet by mouth every morning. For calcium supplement   DIGOXIN (LANOXIN) 0.125 MG TABLET    Take 0.125 mg by mouth every other day.   DIGOXIN (LANOXIN) 0.25 MG TABLET    Take 0.25 mg by mouth every other day. Alternate between  0.125 and 0.250   DILTIAZEM (CARDIZEM CD) 120 MG 24 HR  CAPSULE    Take 120 mg by mouth daily. For A-Fib   DOCUSATE SODIUM (DSS) 100 MG CAPS    Take 100 mg by mouth daily. For constipation   FUROSEMIDE (LASIX) 80 MG TABLET    Take 1 tablet (80 mg total) by mouth 2 (two) times daily.   OLANZAPINE (ZYPREXA) 5 MG TABLET    Take 5 mg by mouth at bedtime.   POLYETHYL GLYCOL-PROPYL GLYCOL (SYSTANE OP)    Place 1 drop into both eyes 2 (two) times daily. For dry eyes   POTASSIUM CHLORIDE SA (K-DUR,KLOR-CON) 20 MEQ TABLET    Take 20 mEq by mouth 2 (two) times daily.   RIVAROXABAN (XARELTO) 20 MG TABS TABLET    Take 20 mg by mouth daily. For DVT  Modified Medications   No medications on file  Discontinued Medications   No medications on file     Physical Exam: Filed Vitals:   08/21/14 1407  BP:  134/72  Pulse: 88  Temp: 98.4 F (36.9 C)  Resp: 17  SpO2: 96%    General- elderly female, obese, chronically ill appearing, in no acute distress Head- normocephalic, atraumatic Throat- moist mucus membrane Eyes- no pallor, no icterus, no discharge, normal conjunctiva, normal sclera Neck- no cervical lymphadenopathy Cardiovascular- normal s1,s2, no murmurs, no leg edema Respiratory- bilateral poor inspiratory effort, on o2, no wheeze, no rhonchi, no crackles, no use of accessory muscles Abdomen- bowel sounds present, soft, non tender Musculoskeletal- moves to painful stimuli  Neurological- unable to assess, somnolent Skin- warm and dry, brusies in arms and hands Psychiatry- unable to assess    Labs reviewed: Basic Metabolic Panel:  Recent Labs  08/17/14 0420 08/18/14 0415 08/19/14 0510 08/20/14 0503  NA 137 137 132* 133*  K 3.5 3.2* 3.0* 3.6  CL 84* 79* 74* 76*  CO2 43* 46* 48* 49*  GLUCOSE 120* 142* 117* 104*  BUN 28* 31* 28* 32*  CREATININE 0.49* 0.58 0.56 0.58  CALCIUM 7.9* 8.2* 8.0* 8.3*  MG 2.1 2.1  --   --    Liver Function Tests:  Recent Labs  06/17/14 1050 08/11/14 1740 08/12/14 0356  AST 17 23 22   ALT 15 15 12   ALKPHOS 68 89 84  BILITOT 0.5 0.8 0.8  PROT 7.1 7.0 6.8  ALBUMIN 3.6 2.8* 2.6*   No results for input(s): LIPASE, AMYLASE in the last 8760 hours.  Recent Labs  08/11/14 2251 08/19/14 0510  AMMONIA 22 27   CBC:  Recent Labs  02/22/14 1417 08/11/14 1740 08/12/14 0356  08/14/14 0400 08/15/14 0318 08/19/14 0510  WBC 8.3 19.7* 12.9*  < > 8.4 7.8 11.5*  NEUTROABS 5.2 16.2* 11.6*  --   --   --   --   HGB 14.9 14.3 13.4  < > 13.1 12.7 13.8  HCT 45.2 45.9 44.5  < > 42.3 41.1 44.6  MCV 95.8 99.6 100.7*  < > 98.4 97.2 96.7  PLT 288 413* 362  < > 383 364 282  < > = values in this interval not displayed.   Assessment/Plan  Physical deconditioning Will have her work with physical therapy and occupational therapy team to help with  gait training and muscle strengthening exercises.fall precautions. Skin care. Encourage to be out of bed. Continue nutritional supplement. With her somnolence and encephalopathy and minimal participation in therapy, poor prognosis. Making some medication changes as below, and if no improvement, will consider palliative care services.  UTI Complete 1 week course of cefepime, monitor  clinically, check cbc with diff  Leukocytosis Was in hospital recently with sepsis, completed antibiotic course, remains afebrile but is somnolent, recheck cbc with diff  chf S/p diuresis in hospital. euvolemic on exam. Given her hyponatremia in lab and somnolence, decrease lasix to 40 mg bid and reassess.  Chronic respiratory failure In setting of chf and copd,  continue o2 and symbicort  Acute encephalopathy Persists. CT head showed brain atrophy. Her recent infection could have contributed to this. Avoid sedative/ hypnotics and monitor clinically. D/c zyprexa for now. Hyponatremia on d/c from hospital and on lasix 80 mg bid which can worsen hyponatremia Recheck bmp. Marland Kitchen   atrial fibrillation Continue xarelto with diltiazem and digoxin. Monitor clinically   Goals of care: long term care, poor prognosis   Labs/tests ordered: cbc with diff, cmp next lab  Family/ staff Communication: reviewed care plan with patient and nursing supervisor    Blanchie Serve, MD  [facility] [phone] (Monday-Friday 8 am - 5 pm) [phone] (afterhours)

## 2014-09-14 ENCOUNTER — Emergency Department (HOSPITAL_COMMUNITY): Payer: Medicare Other

## 2014-09-14 ENCOUNTER — Encounter (HOSPITAL_COMMUNITY): Payer: Self-pay | Admitting: Emergency Medicine

## 2014-09-14 ENCOUNTER — Inpatient Hospital Stay (HOSPITAL_COMMUNITY)
Admission: EM | Admit: 2014-09-14 | Discharge: 2014-09-18 | DRG: 190 | Disposition: A | Discharge status: Skilled Nursing Facility | Payer: Medicare Other | Attending: Internal Medicine | Admitting: Internal Medicine

## 2014-09-14 ENCOUNTER — Telehealth: Payer: Self-pay | Admitting: Internal Medicine

## 2014-09-14 DIAGNOSIS — Z87891 Personal history of nicotine dependence: Secondary | ICD-10-CM | POA: Diagnosis not present

## 2014-09-14 DIAGNOSIS — E876 Hypokalemia: Secondary | ICD-10-CM | POA: Diagnosis present

## 2014-09-14 DIAGNOSIS — G9340 Encephalopathy, unspecified: Secondary | ICD-10-CM | POA: Diagnosis present

## 2014-09-14 DIAGNOSIS — F0390 Unspecified dementia without behavioral disturbance: Secondary | ICD-10-CM | POA: Diagnosis present

## 2014-09-14 DIAGNOSIS — Z79899 Other long term (current) drug therapy: Secondary | ICD-10-CM | POA: Diagnosis not present

## 2014-09-14 DIAGNOSIS — R0602 Shortness of breath: Secondary | ICD-10-CM | POA: Diagnosis not present

## 2014-09-14 DIAGNOSIS — Z888 Allergy status to other drugs, medicaments and biological substances status: Secondary | ICD-10-CM

## 2014-09-14 DIAGNOSIS — Z881 Allergy status to other antibiotic agents status: Secondary | ICD-10-CM | POA: Diagnosis not present

## 2014-09-14 DIAGNOSIS — Z9889 Other specified postprocedural states: Secondary | ICD-10-CM | POA: Diagnosis not present

## 2014-09-14 DIAGNOSIS — R278 Other lack of coordination: Secondary | ICD-10-CM | POA: Diagnosis not present

## 2014-09-14 DIAGNOSIS — E785 Hyperlipidemia, unspecified: Secondary | ICD-10-CM | POA: Diagnosis present

## 2014-09-14 DIAGNOSIS — R531 Weakness: Secondary | ICD-10-CM | POA: Diagnosis not present

## 2014-09-14 DIAGNOSIS — J811 Chronic pulmonary edema: Secondary | ICD-10-CM | POA: Diagnosis not present

## 2014-09-14 DIAGNOSIS — I499 Cardiac arrhythmia, unspecified: Secondary | ICD-10-CM | POA: Diagnosis not present

## 2014-09-14 DIAGNOSIS — Z880 Allergy status to penicillin: Secondary | ICD-10-CM | POA: Diagnosis not present

## 2014-09-14 DIAGNOSIS — I509 Heart failure, unspecified: Secondary | ICD-10-CM | POA: Diagnosis not present

## 2014-09-14 DIAGNOSIS — I4891 Unspecified atrial fibrillation: Secondary | ICD-10-CM | POA: Diagnosis not present

## 2014-09-14 DIAGNOSIS — Z66 Do not resuscitate: Secondary | ICD-10-CM | POA: Diagnosis present

## 2014-09-14 DIAGNOSIS — Z803 Family history of malignant neoplasm of breast: Secondary | ICD-10-CM | POA: Diagnosis not present

## 2014-09-14 DIAGNOSIS — J90 Pleural effusion, not elsewhere classified: Secondary | ICD-10-CM | POA: Diagnosis not present

## 2014-09-14 DIAGNOSIS — R0603 Acute respiratory distress: Secondary | ICD-10-CM

## 2014-09-14 DIAGNOSIS — K210 Gastro-esophageal reflux disease with esophagitis: Secondary | ICD-10-CM | POA: Diagnosis not present

## 2014-09-14 DIAGNOSIS — G9389 Other specified disorders of brain: Secondary | ICD-10-CM | POA: Diagnosis not present

## 2014-09-14 DIAGNOSIS — Z7901 Long term (current) use of anticoagulants: Secondary | ICD-10-CM

## 2014-09-14 DIAGNOSIS — J441 Chronic obstructive pulmonary disease with (acute) exacerbation: Principal | ICD-10-CM | POA: Diagnosis present

## 2014-09-14 DIAGNOSIS — J9601 Acute respiratory failure with hypoxia: Secondary | ICD-10-CM | POA: Diagnosis not present

## 2014-09-14 DIAGNOSIS — R479 Unspecified speech disturbances: Secondary | ICD-10-CM | POA: Diagnosis not present

## 2014-09-14 DIAGNOSIS — J9600 Acute respiratory failure, unspecified whether with hypoxia or hypercapnia: Secondary | ICD-10-CM | POA: Diagnosis present

## 2014-09-14 DIAGNOSIS — M1990 Unspecified osteoarthritis, unspecified site: Secondary | ICD-10-CM | POA: Diagnosis present

## 2014-09-14 DIAGNOSIS — Z882 Allergy status to sulfonamides status: Secondary | ICD-10-CM

## 2014-09-14 DIAGNOSIS — Z8249 Family history of ischemic heart disease and other diseases of the circulatory system: Secondary | ICD-10-CM

## 2014-09-14 DIAGNOSIS — R401 Stupor: Secondary | ICD-10-CM | POA: Diagnosis not present

## 2014-09-14 DIAGNOSIS — I5032 Chronic diastolic (congestive) heart failure: Secondary | ICD-10-CM | POA: Diagnosis present

## 2014-09-14 DIAGNOSIS — K219 Gastro-esophageal reflux disease without esophagitis: Secondary | ICD-10-CM | POA: Diagnosis present

## 2014-09-14 DIAGNOSIS — I482 Chronic atrial fibrillation: Secondary | ICD-10-CM | POA: Diagnosis present

## 2014-09-14 DIAGNOSIS — Z91018 Allergy to other foods: Secondary | ICD-10-CM

## 2014-09-14 DIAGNOSIS — R0600 Dyspnea, unspecified: Secondary | ICD-10-CM | POA: Diagnosis not present

## 2014-09-14 DIAGNOSIS — I8290 Acute embolism and thrombosis of unspecified vein: Secondary | ICD-10-CM | POA: Diagnosis not present

## 2014-09-14 DIAGNOSIS — J948 Other specified pleural conditions: Secondary | ICD-10-CM | POA: Diagnosis not present

## 2014-09-14 DIAGNOSIS — J9621 Acute and chronic respiratory failure with hypoxia: Secondary | ICD-10-CM | POA: Diagnosis present

## 2014-09-14 DIAGNOSIS — J449 Chronic obstructive pulmonary disease, unspecified: Secondary | ICD-10-CM | POA: Diagnosis not present

## 2014-09-14 LAB — CBC
HEMATOCRIT: 39.4 % (ref 36.0–46.0)
Hemoglobin: 12.3 g/dL (ref 12.0–15.0)
MCH: 30.6 pg (ref 26.0–34.0)
MCHC: 31.2 g/dL (ref 30.0–36.0)
MCV: 98 fL (ref 78.0–100.0)
Platelets: 291 10*3/uL (ref 150–400)
RBC: 4.02 MIL/uL (ref 3.87–5.11)
RDW: 17.1 % — ABNORMAL HIGH (ref 11.5–15.5)
WBC: 14.2 10*3/uL — ABNORMAL HIGH (ref 4.0–10.5)

## 2014-09-14 LAB — BASIC METABOLIC PANEL
Anion gap: 20 — ABNORMAL HIGH (ref 5–15)
BUN: 26 mg/dL — AB (ref 6–23)
CALCIUM: 9.2 mg/dL (ref 8.4–10.5)
CHLORIDE: 91 mmol/L — AB (ref 96–112)
CO2: 23 mmol/L (ref 19–32)
Creatinine, Ser: 0.64 mg/dL (ref 0.50–1.10)
GFR calc Af Amer: 90 mL/min — ABNORMAL LOW (ref >90)
GFR calc non Af Amer: 77 mL/min — ABNORMAL LOW (ref >90)
GLUCOSE: 199 mg/dL — AB (ref 70–99)
POTASSIUM: 4.4 mmol/L (ref 3.5–5.1)
Sodium: 134 mmol/L — ABNORMAL LOW (ref 135–145)

## 2014-09-14 LAB — DIFFERENTIAL
BASOS PCT: 0 % (ref 0–1)
Basophils Absolute: 0 10*3/uL (ref 0.0–0.1)
EOS ABS: 0 10*3/uL (ref 0.0–0.7)
Eosinophils Relative: 0 % (ref 0–5)
LYMPHS ABS: 1.9 10*3/uL (ref 0.7–4.0)
Lymphocytes Relative: 14 % (ref 12–46)
Monocytes Absolute: 1.4 10*3/uL — ABNORMAL HIGH (ref 0.1–1.0)
Monocytes Relative: 11 % (ref 3–12)
NEUTROS ABS: 10.3 10*3/uL — AB (ref 1.7–7.7)
Neutrophils Relative %: 75 % (ref 43–77)

## 2014-09-14 LAB — I-STAT TROPONIN, ED: Troponin i, poc: 0.04 ng/mL (ref 0.00–0.08)

## 2014-09-14 LAB — I-STAT CG4 LACTIC ACID, ED: LACTIC ACID, VENOUS: 2.05 mmol/L — AB (ref 0.5–2.0)

## 2014-09-14 MED ORDER — IPRATROPIUM BROMIDE 0.02 % IN SOLN
0.5000 mg | Freq: Once | RESPIRATORY_TRACT | Status: AC
  Administered 2014-09-14: 0.5 mg via RESPIRATORY_TRACT

## 2014-09-14 MED ORDER — IPRATROPIUM-ALBUTEROL 0.5-2.5 (3) MG/3ML IN SOLN
3.0000 mL | Freq: Once | RESPIRATORY_TRACT | Status: DC
  Filled 2014-09-14: qty 3

## 2014-09-14 MED ORDER — ALBUTEROL SULFATE (2.5 MG/3ML) 0.083% IN NEBU
5.0000 mg | INHALATION_SOLUTION | Freq: Once | RESPIRATORY_TRACT | Status: AC
  Administered 2014-09-14: 5 mg via RESPIRATORY_TRACT

## 2014-09-14 MED ORDER — ALBUTEROL SULFATE (2.5 MG/3ML) 0.083% IN NEBU
5.0000 mg | INHALATION_SOLUTION | Freq: Once | RESPIRATORY_TRACT | Status: DC
  Filled 2014-09-14: qty 6

## 2014-09-14 NOTE — ED Notes (Signed)
Marissa PA notified of critical lactic acid lab value

## 2014-09-14 NOTE — ED Notes (Signed)
Bed: WA04 Expected date:  Expected time:  Means of arrival:  Comments: EMS 

## 2014-09-14 NOTE — ED Provider Notes (Signed)
CSN: [id number]     Arrival date & time 09/14/14  2133 History   None    Chief Complaint  Patient presents with  . Respiratory Distress     (Consider location/radiation/quality/duration/timing/severity/associated sxs/prior Treatment) The history is provided by the patient. No language interpreter was used.  Jennifer Nielsen is an 81 y/o F with PMHx of arrhythmia, chronic atrial fibrillation on spell toe, arthritis, history of blood clots, hyperlipidemia, GERD, dementia, CHF, COPD presenting to the ED with shortness of breath and difficulty breathing and was starting today. Patient is currently a [facility], patient was found to have difficulty breathing and getting progressively worse all day. Patient reported that she's been feeling short of breath even at rest. Daughters, who accompany patient, reported that patient has been given Lasix by mouth at the nursing home as well as albuterol treatments without relief. Reported that patient has felt warm. EMS was called-IV Solu-Medrol 125 mg, 5 mg of albuterol were administered. Daughters reported that since patient was discharged on 08/20/2014 from previous hospitalization patient has been on oxygen therapy at home. Stated that she has used BiPAP in the past, denied ever having to be intubated. Patient denied productive cough, chest pain, nausea, vomiting, abdominal pain, travel, leg swelling, arm swelling, weakness, numbness, tingling, loss of sensation. PCP Dr. day  Past Medical History  Diagnosis Date  . Arrhythmia   . Arthritis   . Atrial fibrillation     Remains stable (As of 05/09/13)  . H/O blood clots   . Hyperlipemia   . Dementia     Remains stable & continues to function adequately in the current living environment with supervision. Has had little changes in behavior (AS OF 05/09/13)  . GERD (gastroesophageal reflux disease)     Stable (As of 05/09/13)  . Osteoarthritis     Denies pain (As of 05/09/13)  . CHF  (congestive heart failure)     chronic diastolic  . COPD (chronic obstructive pulmonary disease)     w/exacerbation   Past Surgical History  Procedure Laterality Date  . Appendectomy    . Gallbladder surgery    . Hip surgery      Right-Metal plate   . Leg surgery      Left leg   Family History  Problem Relation Age of Onset  . Colon cancer Neg Hx   . Heart disease Father   . Breast cancer Daughter    History  Substance Use Topics  . Smoking status: Former Research scientist (life sciences)  . Smokeless tobacco: Never Used  . Alcohol Use: No   OB History    No data available     Review of Systems  Constitutional: Negative for fever and chills.  Eyes: Negative for visual disturbance.  Respiratory: Positive for cough and shortness of breath. Negative for chest tightness.   Cardiovascular: Negative for chest pain.  Gastrointestinal: Negative for nausea, vomiting and abdominal pain.      Allergies  Chocolate; Ciprofloxacin; Coffee bean extract; Coffee flavor; Gentamycin; Imodium; Penicillins; Sulfa antibiotics; and Tea  Home Medications   Prior to Admission medications   Medication Sig Start Date End Date Taking? Authorizing Provider  acetaminophen (TYLENOL) 500 MG tablet Take 500 mg by mouth 3 (three) times daily.    Yes Historical Provider, MD  budesonide-formoterol (SYMBICORT) 160-4.5 MCG/ACT inhaler Inhale 2 puffs into the lungs 2 (two) times daily.   Yes Historical Provider, MD  Calcium Carbonate-Vitamin D (CALTRATE 600+D PO) Take 1 tablet by mouth every morning.  For calcium supplement   Yes Historical Provider, MD  digoxin (LANOXIN) 0.125 MG tablet Take 0.125 mg by mouth every other day.   Yes Historical Provider, MD  digoxin (LANOXIN) 0.25 MG tablet Take 0.25 mg by mouth every other day. Alternate between  0.125 and 0.250   Yes Historical Provider, MD  diltiazem (CARDIZEM CD) 120 MG 24 hr capsule Take 120 mg by mouth daily. For A-Fib 07/02/13  Yes Bobby Rumpf York, PA-C  Docusate Sodium  (DSS) 100 MG CAPS Take 100 mg by mouth daily. For constipation 07/02/13  Yes Bobby Rumpf York, PA-C  furosemide (LASIX) 40 MG tablet Take 40 mg by mouth 2 (two) times daily.   Yes Historical Provider, MD  Multiple Vitamins-Minerals (DECUBI-VITE) CAPS Take 1 capsule by mouth daily.   Yes Historical Provider, MD  Nutritional Supplements (NUTRITIONAL DRINK PO) Take 120 mLs by mouth 2 (two) times daily. MIGHTY SHAKES   Yes Historical Provider, MD  Polyethyl Glycol-Propyl Glycol (SYSTANE OP) Place 1 drop into both eyes 2 (two) times daily. For dry eyes   Yes Historical Provider, MD  potassium chloride SA (K-DUR,KLOR-CON) 20 MEQ tablet Take 20 mEq by mouth 2 (two) times daily.   Yes Historical Provider, MD  Protein (PROCEL) POWD Take 2 scoop by mouth 2 (two) times daily.   Yes Historical Provider, MD  Rivaroxaban (XARELTO) 20 MG TABS tablet Take 20 mg by mouth daily. For DVT   Yes Historical Provider, MD  furosemide (LASIX) 80 MG tablet Take 1 tablet (80 mg total) by mouth 2 (two) times daily. Patient not taking: Reported on 09/14/2014 08/21/14   Orson Eva, MD   BP 166/98 mmHg  Pulse 123  Temp(Src) 97.7 F (36.5 C) (Oral)  Resp 30  SpO2 95% Physical Exam  Constitutional: She is oriented to person, place, and time. She appears well-developed and well-nourished. No distress.  Chronically ill appearing female  HENT:  Head: Normocephalic and atraumatic.  Eyes: Conjunctivae and EOM are normal. Pupils are equal, round, and reactive to light. Right eye exhibits no discharge. Left eye exhibits no discharge.  Neck: Normal range of motion. Neck supple. No tracheal deviation present.  Cardiovascular: Regular rhythm and normal heart sounds.  Exam reveals no friction rub.   No murmur heard. Cap refill < 3 seconds Negative swelling or pitting edema noted to the lower extremities bilaterally  Negative swelling noted to the upper extremities - negative crepitus upon palpation   Pulmonary/Chest: No respiratory  distress. She has wheezes in the right upper field and the left upper field. She has no rales. She exhibits no tenderness.  Increased effort  Patient using accessory muscles   Musculoskeletal: Normal range of motion.  Lymphadenopathy:    She has no cervical adenopathy.  Neurological: She is alert and oriented to person, place, and time. No cranial nerve deficit. She exhibits normal muscle tone. Coordination normal.  Skin: Skin is warm and dry. No rash noted. She is not diaphoretic. No erythema.  Psychiatric: She has a normal mood and affect. Her behavior is normal. Thought content normal.  Nursing note and vitals reviewed.   ED Course  Procedures (including critical care time)  Results for orders placed or performed during the hospital encounter of 32/20/25  Basic metabolic panel    (if pt has PMH of COPD)  Result Value Ref Range   Sodium 134 (L) 135 - 145 mmol/L   Potassium 4.4 3.5 - 5.1 mmol/L   Chloride 91 (L) 96 - 112 mmol/L  CO2 23 19 - 32 mmol/L   Glucose, Bld 199 (H) 70 - 99 mg/dL   BUN 26 (H) 6 - 23 mg/dL   Creatinine, Ser 0.64 0.50 - 1.10 mg/dL   Calcium 9.2 8.4 - 10.5 mg/dL   GFR calc non Af Amer 77 (L) >90 mL/min   GFR calc Af Amer 90 (L) >90 mL/min   Anion gap 20 (H) 5 - 15  CBC     (if pt has PMH of COPD)  Result Value Ref Range   WBC 14.2 (H) 4.0 - 10.5 K/uL   RBC 4.02 3.87 - 5.11 MIL/uL   Hemoglobin 12.3 12.0 - 15.0 g/dL   HCT 39.4 36.0 - 46.0 %   MCV 98.0 78.0 - 100.0 fL   MCH 30.6 26.0 - 34.0 pg   MCHC 31.2 30.0 - 36.0 g/dL   RDW 17.1 (H) 11.5 - 15.5 %   Platelets 291 150 - 400 K/uL  Brain natriuretic peptide  Result Value Ref Range   B Natriuretic Peptide 371.9 (H) 0.0 - 100.0 pg/mL  Digoxin level  Result Value Ref Range   Digoxin Level 1.6 0.8 - 2.0 ng/mL  Differential  Result Value Ref Range   Neutrophils Relative % 75 43 - 77 %   Neutro Abs 10.3 (H) 1.7 - 7.7 K/uL   Lymphocytes Relative 14 12 - 46 %   Lymphs Abs 1.9 0.7 - 4.0 K/uL   Monocytes  Relative 11 3 - 12 %   Monocytes Absolute 1.4 (H) 0.1 - 1.0 K/uL   Eosinophils Relative 0 0 - 5 %   Eosinophils Absolute 0.0 0.0 - 0.7 K/uL   Basophils Relative 0 0 - 1 %   Basophils Absolute 0.0 0.0 - 0.1 K/uL  Blood gas, venous  Result Value Ref Range   O2 Content 4.0 L/min   Delivery systems NASAL CANNULA    pH, Ven 7.342 (H) 7.250 - 7.300   pCO2, Ven 65.0 (H) 45.0 - 50.0 mmHg   pO2, Ven 45.8 (H) 30.0 - 45.0 mmHg   Bicarbonate 34.3 (H) 20.0 - 24.0 mEq/L   TCO2 31.1 0 - 100 mmol/L   Acid-Base Excess 6.8 (H) 0.0 - 2.0 mmol/L   O2 Saturation 76.9 %   Patient temperature 98.6    Collection site VEIN    Drawn by COLLECTED BY LABORATORY    Sample type VEIN   I-stat troponin, ED (if patient has history of COPD)  Result Value Ref Range   Troponin i, poc 0.04 0.00 - 0.08 ng/mL   Comment 3          I-Stat CG4 Lactic Acid, ED  Result Value Ref Range   Lactic Acid, Venous 2.05 (HH) 0.5 - 2.0 mmol/L   Comment NOTIFIED PHYSICIAN     Labs Review Labs Reviewed  BASIC METABOLIC PANEL - Abnormal; Notable for the following:    Sodium 134 (*)    Chloride 91 (*)    Glucose, Bld 199 (*)    BUN 26 (*)    GFR calc non Af Amer 77 (*)    GFR calc Af Amer 90 (*)    Anion gap 20 (*)    All other components within normal limits  CBC - Abnormal; Notable for the following:    WBC 14.2 (*)    RDW 17.1 (*)    All other components within normal limits  BRAIN NATRIURETIC PEPTIDE - Abnormal; Notable for the following:    B Natriuretic Peptide 371.9 (*)  All other components within normal limits  DIFFERENTIAL - Abnormal; Notable for the following:    Neutro Abs 10.3 (*)    Monocytes Absolute 1.4 (*)    All other components within normal limits  BLOOD GAS, VENOUS - Abnormal; Notable for the following:    pH, Ven 7.342 (*)    pCO2, Ven 65.0 (*)    pO2, Ven 45.8 (*)    Bicarbonate 34.3 (*)    Acid-Base Excess 6.8 (*)    All other components within normal limits  I-STAT CG4 LACTIC ACID, ED -  Abnormal; Notable for the following:    Lactic Acid, Venous 2.05 (*)    All other components within normal limits  DIGOXIN LEVEL  URINALYSIS, ROUTINE W REFLEX MICROSCOPIC  I-STAT TROPOININ, ED    Imaging Review Dg Chest 2 View (if Patient Has Fever And/or Copd)  09/14/2014   CLINICAL DATA:  Dyspnea for 1 day.  EXAM: CHEST  2 VIEW  COMPARISON:  08/16/2014  FINDINGS: There is a large left pleural effusion, increased from 08/16/2014. There is a smaller right pleural effusion. There is marked unchanged cardiomegaly. Moderate vascular and interstitial congestion is present.  IMPRESSION: Large left pleural effusion and small right pleural effusion, increased from 08/16/2014. Moderate vascular and interstitial congestive changes are present.   Electronically Signed   By: Andreas Newport M.D.   On: 09/14/2014 23:18     EKG Interpretation   Date/Time:  Saturday September 14 2014 21:51:43 EDT Ventricular Rate:  109 PR Interval:    QRS Duration: 80 QT Interval:  366 QTC Calculation: 493 R Axis:   51 Text Interpretation:  Atrial fibrillation Low voltage, precordial leads  Borderline repolarization abnormality Borderline prolonged QT interval No  significant change was found Confirmed by Florina Ou  MD, Jenny Reichmann ([id number]) on  09/14/2014 10:40:32 PM      12:46 AM This provider spoke with Dr. Gasper Lloyd, triad hospitalist. Discussed case, labs, imaging, ED course in great detail. Patient to be admitted to stepdown for COPD exacerbation with pleural effusion.  MDM   Final diagnoses:  COPD exacerbation  Pleural effusion  Respiratory distress    Medications  albuterol (PROVENTIL) (2.5 MG/3ML) 0.083% nebulizer solution 5 mg (5 mg Nebulization Given 09/14/14 2201)  ipratropium (ATROVENT) nebulizer solution 0.5 mg (0.5 mg Nebulization Given 09/14/14 2201)   Filed Vitals:   09/14/14 2305 09/14/14 2315 09/14/14 2330 09/14/14 2345  BP: 174/64 151/79 148/59 166/98  Pulse: 106 111 48 123  Temp:      TempSrc:       Resp: 34 32 24 30  SpO2: 93% 91% 94% 95%   EKG noted atrial fibrillation with a heart rate of 109 bpm with prolonged QT interval - he can change since last tracing. i-STAT troponin negative elevation. BNP mildly elevated 371.9. Digoxin level negative elevation.CBC noted elevated leukocytosis of 14.2. Hemoglobin 12.3, hematocrit 39.4. BMP noted sodium 134. Chloride of 91. Glucose of 199 with elevated anion gap of 20.0 mEq per liter. Lactic acid elevated at 2.05. Venous blood gas noted elevated bicarbonate of 34.3, elevated carbon dioxide of 65.0-patient appears to be holding in carbon dioxide. Chest x-ray noted large left pleural effusion and small right pleural effusion that is increased since 08/16/2014, moderate vascular interstitial congestion changes are present. Urine pending. Patient presenting to the ED with left pleural effusion and right pleural effusion that is increased since 08/16/2014. Negative findings of CHF exacerbation, negative findings of fluid overload at this time. Patient placed on BiPAP-when placed  on BiPAP patient breathing more comfortably, pulse ox is increased to 97%. Patient seen and assessed by attending physician, Dr. Florina Ou, agreed to BiPAP and recommended patient to be admitted for respiratory distress. Negative findings of infection at this time-negative on his pneumonia. Discussed case in great detail with  Dr. Arnoldo Morale, patient to be admitted to stepdown. Discussed plan for admission with patient and daughters who are in accordance with plan. Patient stable for transfer to floor.  Jamse Mead, PA-C 09/15/14 5747  Shanon Rosser, MD 09/15/14 [phone]

## 2014-09-14 NOTE — Telephone Encounter (Signed)
Pt with h/o CHF and COPD with last hospitalization for dyspnea was noted to be hypertensive, mildly tachycardic, POX 79 on her normal 3 liters of oxygen, RR increased, decreased breath sounds.  Nurse reviewed her weights and noted 9 lb weight gain (?duration).  She notes one indication that a provider was informed of 3# weight gain.  Also admits to distention of abdomen felt to be ascites and possibly some increased edema of lower extremities.  Currently on lasix 40mg  po bid (not 80mg  po bid as epic chart indicates).  She is no longer on any other diuretics as she had been before her last admission.  Orders given: Duoneb now O2 to keep sats over 90%  CXR stat Cbc, bmp stat Give am lasix 40mg  now, repeat lasix again at 2pm Check vitals q 2 hrs for 24 hrs Call me back with results when return.  If she does not improve within next couple of hrs, may need to go back out to hospital for IV lasix.  Nursing there stated they cannot administer it except orally at [facility].

## 2014-09-14 NOTE — ED Notes (Addendum)
[facility] presents with a 81 yo female from [place] in respiratory distress.  Per staff patient has been having difficulty breathing all day and breathing became more difficulty as the day has progressed.  Hx of afib, COPD, GERD, afib and CHF.  Pt also has upper extremity edema that has progressively gotten worse over past week per staff.  Pt is allergic to Cipro, penicillin, sulfur and gentamycin.  Also allergies to chocolate and Immodium.  Pt takes Xarelto, Cardizem, Lasix. 125 mg Solumedrol and 5 mg of albuterol given by [facility] with SpO2 improvement from 89% to 96%.

## 2014-09-14 NOTE — Telephone Encounter (Signed)
CXR returned with bilateral pleural effusions left greater than right.  Atelectasis vs pna at bases.  Old rib fxs.  Pt improved except continued increased work of breathing.  Labs not yet drawn.  Daughter was informed of events and coming to facility.  New orders:  Do labs asap.  Zaroxolyn 2.5mg  po once now.

## 2014-09-15 ENCOUNTER — Inpatient Hospital Stay (HOSPITAL_COMMUNITY): Payer: Medicare Other

## 2014-09-15 ENCOUNTER — Encounter (HOSPITAL_COMMUNITY): Payer: Self-pay | Admitting: *Deleted

## 2014-09-15 DIAGNOSIS — Z87891 Personal history of nicotine dependence: Secondary | ICD-10-CM | POA: Diagnosis not present

## 2014-09-15 DIAGNOSIS — Z79899 Other long term (current) drug therapy: Secondary | ICD-10-CM | POA: Diagnosis not present

## 2014-09-15 DIAGNOSIS — E876 Hypokalemia: Secondary | ICD-10-CM | POA: Diagnosis present

## 2014-09-15 DIAGNOSIS — Z888 Allergy status to other drugs, medicaments and biological substances status: Secondary | ICD-10-CM | POA: Diagnosis not present

## 2014-09-15 DIAGNOSIS — R0603 Acute respiratory distress: Secondary | ICD-10-CM | POA: Insufficient documentation

## 2014-09-15 DIAGNOSIS — I5032 Chronic diastolic (congestive) heart failure: Secondary | ICD-10-CM

## 2014-09-15 DIAGNOSIS — I482 Chronic atrial fibrillation: Secondary | ICD-10-CM

## 2014-09-15 DIAGNOSIS — I8290 Acute embolism and thrombosis of unspecified vein: Secondary | ICD-10-CM | POA: Diagnosis not present

## 2014-09-15 DIAGNOSIS — J441 Chronic obstructive pulmonary disease with (acute) exacerbation: Secondary | ICD-10-CM | POA: Diagnosis not present

## 2014-09-15 DIAGNOSIS — R401 Stupor: Secondary | ICD-10-CM | POA: Diagnosis not present

## 2014-09-15 DIAGNOSIS — E785 Hyperlipidemia, unspecified: Secondary | ICD-10-CM | POA: Diagnosis present

## 2014-09-15 DIAGNOSIS — K219 Gastro-esophageal reflux disease without esophagitis: Secondary | ICD-10-CM | POA: Diagnosis present

## 2014-09-15 DIAGNOSIS — Z7901 Long term (current) use of anticoagulants: Secondary | ICD-10-CM

## 2014-09-15 DIAGNOSIS — R0600 Dyspnea, unspecified: Secondary | ICD-10-CM | POA: Diagnosis not present

## 2014-09-15 DIAGNOSIS — Z8249 Family history of ischemic heart disease and other diseases of the circulatory system: Secondary | ICD-10-CM | POA: Diagnosis not present

## 2014-09-15 DIAGNOSIS — J90 Pleural effusion, not elsewhere classified: Secondary | ICD-10-CM

## 2014-09-15 DIAGNOSIS — J449 Chronic obstructive pulmonary disease, unspecified: Secondary | ICD-10-CM | POA: Diagnosis not present

## 2014-09-15 DIAGNOSIS — I4891 Unspecified atrial fibrillation: Secondary | ICD-10-CM | POA: Diagnosis not present

## 2014-09-15 DIAGNOSIS — Z803 Family history of malignant neoplasm of breast: Secondary | ICD-10-CM | POA: Diagnosis not present

## 2014-09-15 DIAGNOSIS — Z880 Allergy status to penicillin: Secondary | ICD-10-CM | POA: Diagnosis not present

## 2014-09-15 DIAGNOSIS — Z91018 Allergy to other foods: Secondary | ICD-10-CM | POA: Diagnosis not present

## 2014-09-15 DIAGNOSIS — F0390 Unspecified dementia without behavioral disturbance: Secondary | ICD-10-CM | POA: Diagnosis present

## 2014-09-15 DIAGNOSIS — K210 Gastro-esophageal reflux disease with esophagitis: Secondary | ICD-10-CM | POA: Diagnosis not present

## 2014-09-15 DIAGNOSIS — R479 Unspecified speech disturbances: Secondary | ICD-10-CM | POA: Diagnosis not present

## 2014-09-15 DIAGNOSIS — R278 Other lack of coordination: Secondary | ICD-10-CM | POA: Diagnosis not present

## 2014-09-15 DIAGNOSIS — I509 Heart failure, unspecified: Secondary | ICD-10-CM | POA: Diagnosis not present

## 2014-09-15 DIAGNOSIS — I499 Cardiac arrhythmia, unspecified: Secondary | ICD-10-CM | POA: Diagnosis not present

## 2014-09-15 DIAGNOSIS — R531 Weakness: Secondary | ICD-10-CM | POA: Diagnosis not present

## 2014-09-15 DIAGNOSIS — J9601 Acute respiratory failure with hypoxia: Secondary | ICD-10-CM | POA: Diagnosis not present

## 2014-09-15 DIAGNOSIS — Z882 Allergy status to sulfonamides status: Secondary | ICD-10-CM | POA: Diagnosis not present

## 2014-09-15 DIAGNOSIS — Z66 Do not resuscitate: Secondary | ICD-10-CM | POA: Diagnosis present

## 2014-09-15 DIAGNOSIS — G9340 Encephalopathy, unspecified: Secondary | ICD-10-CM | POA: Diagnosis present

## 2014-09-15 DIAGNOSIS — G9389 Other specified disorders of brain: Secondary | ICD-10-CM | POA: Diagnosis not present

## 2014-09-15 DIAGNOSIS — R0602 Shortness of breath: Secondary | ICD-10-CM | POA: Diagnosis not present

## 2014-09-15 DIAGNOSIS — M1990 Unspecified osteoarthritis, unspecified site: Secondary | ICD-10-CM | POA: Diagnosis present

## 2014-09-15 DIAGNOSIS — J9621 Acute and chronic respiratory failure with hypoxia: Secondary | ICD-10-CM | POA: Diagnosis present

## 2014-09-15 DIAGNOSIS — Z881 Allergy status to other antibiotic agents status: Secondary | ICD-10-CM | POA: Diagnosis not present

## 2014-09-15 DIAGNOSIS — J948 Other specified pleural conditions: Secondary | ICD-10-CM | POA: Diagnosis not present

## 2014-09-15 DIAGNOSIS — J9600 Acute respiratory failure, unspecified whether with hypoxia or hypercapnia: Secondary | ICD-10-CM | POA: Diagnosis not present

## 2014-09-15 DIAGNOSIS — Z9889 Other specified postprocedural states: Secondary | ICD-10-CM | POA: Diagnosis not present

## 2014-09-15 LAB — BODY FLUID CELL COUNT WITH DIFFERENTIAL
Lymphs, Fluid: 12 %
MONOCYTE-MACROPHAGE-SEROUS FLUID: 55 % (ref 50–90)
NEUTROPHIL FLUID: 33 % — AB (ref 0–25)
WBC FLUID: 1091 uL — AB (ref 0–1000)

## 2014-09-15 LAB — BASIC METABOLIC PANEL
ANION GAP: 11 (ref 5–15)
BUN: 29 mg/dL — ABNORMAL HIGH (ref 6–23)
CO2: 30 mmol/L (ref 19–32)
Calcium: 8.9 mg/dL (ref 8.4–10.5)
Chloride: 94 mmol/L — ABNORMAL LOW (ref 96–112)
Creatinine, Ser: 0.64 mg/dL (ref 0.50–1.10)
GFR calc non Af Amer: 77 mL/min — ABNORMAL LOW (ref >90)
GFR, EST AFRICAN AMERICAN: 90 mL/min — AB (ref >90)
Glucose, Bld: 217 mg/dL — ABNORMAL HIGH (ref 70–99)
Potassium: 5 mmol/L (ref 3.5–5.1)
SODIUM: 135 mmol/L (ref 135–145)

## 2014-09-15 LAB — CBC
HCT: 36.8 % (ref 36.0–46.0)
Hemoglobin: 11.8 g/dL — ABNORMAL LOW (ref 12.0–15.0)
MCH: 30.6 pg (ref 26.0–34.0)
MCHC: 32.1 g/dL (ref 30.0–36.0)
MCV: 95.3 fL (ref 78.0–100.0)
PLATELETS: 250 10*3/uL (ref 150–400)
RBC: 3.86 MIL/uL — ABNORMAL LOW (ref 3.87–5.11)
RDW: 17 % — AB (ref 11.5–15.5)
WBC: 11.9 10*3/uL — ABNORMAL HIGH (ref 4.0–10.5)

## 2014-09-15 LAB — BLOOD GAS, VENOUS
ACID-BASE EXCESS: 6.8 mmol/L — AB (ref 0.0–2.0)
BICARBONATE: 34.3 meq/L — AB (ref 20.0–24.0)
O2 CONTENT: 4 L/min
O2 Saturation: 76.9 %
Patient temperature: 98.6
TCO2: 31.1 mmol/L (ref 0–100)
pCO2, Ven: 65 mmHg — ABNORMAL HIGH (ref 45.0–50.0)
pH, Ven: 7.342 — ABNORMAL HIGH (ref 7.250–7.300)
pO2, Ven: 45.8 mmHg — ABNORMAL HIGH (ref 30.0–45.0)

## 2014-09-15 LAB — BLOOD GAS, ARTERIAL
Acid-Base Excess: 11.6 mmol/L — ABNORMAL HIGH (ref 0.0–2.0)
Bicarbonate: 36.9 mEq/L — ABNORMAL HIGH (ref 20.0–24.0)
Drawn by: 103701
O2 Content: 2 L/min
O2 SAT: 94.2 %
PATIENT TEMPERATURE: 98.6
PH ART: 7.46 — AB (ref 7.350–7.450)
TCO2: 33.4 mmol/L (ref 0–100)
pCO2 arterial: 52.6 mmHg — ABNORMAL HIGH (ref 35.0–45.0)
pO2, Arterial: 71.6 mmHg — ABNORMAL LOW (ref 80.0–100.0)

## 2014-09-15 LAB — INFLUENZA PANEL BY PCR (TYPE A & B)
H1N1 flu by pcr: NOT DETECTED
INFLAPCR: NEGATIVE
INFLBPCR: NEGATIVE

## 2014-09-15 LAB — HEPATIC FUNCTION PANEL
ALT: 28 U/L (ref 0–35)
AST: 28 U/L (ref 0–37)
Albumin: 2.6 g/dL — ABNORMAL LOW (ref 3.5–5.2)
Alkaline Phosphatase: 82 U/L (ref 39–117)
BILIRUBIN TOTAL: 0.3 mg/dL (ref 0.3–1.2)
Bilirubin, Direct: 0.2 mg/dL (ref 0.0–0.5)
Indirect Bilirubin: 0.1 mg/dL — ABNORMAL LOW (ref 0.3–0.9)
Total Protein: 6.4 g/dL (ref 6.0–8.3)

## 2014-09-15 LAB — BRAIN NATRIURETIC PEPTIDE: B NATRIURETIC PEPTIDE 5: 371.9 pg/mL — AB (ref 0.0–100.0)

## 2014-09-15 LAB — GLUCOSE, SEROUS FLUID: Glucose, Fluid: 168 mg/dL

## 2014-09-15 LAB — LACTATE DEHYDROGENASE, PLEURAL OR PERITONEAL FLUID: LD, Fluid: 117 U/L — ABNORMAL HIGH (ref 3–23)

## 2014-09-15 LAB — PROTEIN, BODY FLUID: Total protein, fluid: 3.7 g/dL

## 2014-09-15 LAB — LACTATE DEHYDROGENASE: LDH: 132 U/L (ref 94–250)

## 2014-09-15 LAB — DIGOXIN LEVEL: Digoxin Level: 1.6 ng/mL (ref 0.8–2.0)

## 2014-09-15 LAB — MRSA PCR SCREENING: MRSA BY PCR: NEGATIVE

## 2014-09-15 MED ORDER — HYDROCODONE-ACETAMINOPHEN 5-325 MG PO TABS: 1.0000 | ORAL_TABLET | Freq: Four times a day (QID) | ORAL | Status: DC | PRN

## 2014-09-15 MED ORDER — ACETAMINOPHEN 650 MG RE SUPP: 650.0000 mg | Freq: Four times a day (QID) | RECTAL | Status: DC | PRN

## 2014-09-15 MED ORDER — DEXTROSE 5 % IV SOLN
500.0000 mg | INTRAVENOUS | Status: DC
  Administered 2014-09-15 – 2014-09-17 (×3): 500 mg via INTRAVENOUS
  Filled 2014-09-15 (×3): qty 500

## 2014-09-15 MED ORDER — ONDANSETRON HCL 4 MG PO TABS: 4.0000 mg | ORAL_TABLET | Freq: Four times a day (QID) | ORAL | Status: DC | PRN

## 2014-09-15 MED ORDER — ACETAMINOPHEN 325 MG PO TABS: 650.0000 mg | ORAL_TABLET | Freq: Four times a day (QID) | ORAL | Status: DC | PRN

## 2014-09-15 MED ORDER — SODIUM CHLORIDE 0.9 % IJ SOLN: 3.0000 mL | Freq: Two times a day (BID) | INTRAMUSCULAR | Status: DC

## 2014-09-15 MED ORDER — POTASSIUM CHLORIDE CRYS ER 10 MEQ PO TBCR
10.0000 meq | EXTENDED_RELEASE_TABLET | Freq: Every day | ORAL | Status: DC
  Administered 2014-09-15 – 2014-09-18 (×4): 10 meq via ORAL
  Filled 2014-09-15 (×4): qty 1

## 2014-09-15 MED ORDER — CETYLPYRIDINIUM CHLORIDE 0.05 % MT LIQD
7.0000 mL | Freq: Two times a day (BID) | OROMUCOSAL | Status: DC
  Administered 2014-09-15 – 2014-09-17 (×5): 7 mL via OROMUCOSAL

## 2014-09-15 MED ORDER — IPRATROPIUM-ALBUTEROL 0.5-2.5 (3) MG/3ML IN SOLN
3.0000 mL | Freq: Four times a day (QID) | RESPIRATORY_TRACT | Status: DC
  Administered 2014-09-15 – 2014-09-17 (×9): 3 mL via RESPIRATORY_TRACT
  Filled 2014-09-15 (×9): qty 3

## 2014-09-15 MED ORDER — SODIUM CHLORIDE 0.9 % IV SOLN: 250.0000 mL | INTRAVENOUS | Status: DC | PRN

## 2014-09-15 MED ORDER — ONDANSETRON HCL 4 MG/2ML IJ SOLN: 4.0000 mg | Freq: Four times a day (QID) | INTRAMUSCULAR | Status: DC | PRN

## 2014-09-15 MED ORDER — DILTIAZEM HCL ER COATED BEADS 120 MG PO CP24
120.0000 mg | ORAL_CAPSULE | Freq: Every day | ORAL | Status: DC
  Administered 2014-09-15 – 2014-09-18 (×4): 120 mg via ORAL
  Filled 2014-09-15 (×4): qty 1

## 2014-09-15 MED ORDER — GUAIFENESIN ER 600 MG PO TB12
1200.0000 mg | ORAL_TABLET | Freq: Two times a day (BID) | ORAL | Status: DC
  Administered 2014-09-15 – 2014-09-18 (×7): 1200 mg via ORAL
  Filled 2014-09-15 (×8): qty 2

## 2014-09-15 MED ORDER — METHYLPREDNISOLONE SODIUM SUCC 125 MG IJ SOLR: 80.0000 mg | Freq: Once | INTRAMUSCULAR | Status: DC

## 2014-09-15 MED ORDER — SODIUM CHLORIDE 0.9 % IJ SOLN: 3.0000 mL | INTRAMUSCULAR | Status: DC | PRN

## 2014-09-15 MED ORDER — CHLORHEXIDINE GLUCONATE 0.12 % MT SOLN
15.0000 mL | Freq: Two times a day (BID) | OROMUCOSAL | Status: DC
  Administered 2014-09-15 – 2014-09-18 (×7): 15 mL via OROMUCOSAL
  Filled 2014-09-15 (×10): qty 15

## 2014-09-15 MED ORDER — FUROSEMIDE 10 MG/ML IJ SOLN
40.0000 mg | Freq: Every day | INTRAMUSCULAR | Status: DC
  Administered 2014-09-15: 40 mg via INTRAVENOUS
  Filled 2014-09-15: qty 4

## 2014-09-15 MED ORDER — BENZONATATE 100 MG PO CAPS: 100.0000 mg | ORAL_CAPSULE | Freq: Two times a day (BID) | ORAL | Status: DC | PRN

## 2014-09-15 MED ORDER — METHYLPREDNISOLONE SODIUM SUCC 40 MG IJ SOLR
40.0000 mg | Freq: Four times a day (QID) | INTRAMUSCULAR | Status: DC
  Administered 2014-09-15 – 2014-09-16 (×4): 40 mg via INTRAVENOUS
  Filled 2014-09-15 (×4): qty 1

## 2014-09-15 MED ORDER — HYDROMORPHONE HCL 1 MG/ML IJ SOLN: 0.5000 mg | INTRAMUSCULAR | Status: DC | PRN

## 2014-09-15 MED ORDER — RIVAROXABAN 20 MG PO TABS
20.0000 mg | ORAL_TABLET | Freq: Every day | ORAL | Status: DC
  Administered 2014-09-15 – 2014-09-18 (×4): 20 mg via ORAL
  Filled 2014-09-15 (×4): qty 1

## 2014-09-15 MED ORDER — DEXTROSE 5 % IV SOLN
1.0000 g | INTRAVENOUS | Status: DC
  Administered 2014-09-15 – 2014-09-17 (×3): 1 g via INTRAVENOUS
  Filled 2014-09-15 (×3): qty 10

## 2014-09-15 NOTE — Progress Notes (Signed)
Patient had a 16 beat run of V-tach upon assessment patient sitting in bed eating dinner denies in chest pain or discomfort. VS stable. Notified Dr. Coralyn Pear, no new orders given at this time. Will continue to monitor patient and notify as needed.

## 2014-09-15 NOTE — Progress Notes (Signed)
Follow up note  Called by RN 3:30pm to notify me that patient had mental status changes, lethargic, difficult to arouse. I immediately evaluated patient at bedside finding her to lethargic. On exam she had pin point pupils, lungs with improved respiratory sounds on left, satting 100% on 4L via [place]. On leaning her forward she spontaneously improved and became more alert. Had a nonfocal neurologic exam. RN reported that no narcotics had been given on her shift today. A stat ABG showed a pH of 7.46, pCO2 of 52.6, pO2 of 71.6. Will decrease her oxygen to 2L via [place], as I think this may be decreasing respiratory drive, to keep sats around 92%. Will check a stat CT scan of head. PRN dilaudid and norco discontinued.

## 2014-09-15 NOTE — Progress Notes (Signed)
Pt assessed, remains on 3lnc, HR68, rr26, spo2 96%.  Bipap not indicated at this time.  RT will continue to monitor pt.

## 2014-09-15 NOTE — Progress Notes (Signed)
Follow up note  CT scan of brain was followed up, radiology reporting no evidence of acute infract. She had episode of min responsiveness, nonverbal, having pin point pupil. Upon coming to she had trouble getting words out. RN had not given narcotics recently. Will further work up with MRI of brain.

## 2014-09-15 NOTE — H&P (Signed)
[facility] Admission History and Physical       [name] IOX:[id number] DOB: Dec 14, 1924 DOA: 09/14/2014  Referring physician: EDP PCP: [name], MD / Dr Elder Negus Specialists:   Chief Complaint: SOB  HPI: Jennifer Nielsen is a 81 y.o. female with a history of COPD, Atrial Fibrillation on Xarelto Rx, Chronic Diastolic CHF,  Who was sent to the ED from the [facility] due to worsening SOB that began today.    She had been at there baseline before this afternoon, and was observed to have labored breathing and Decreased O2 sats to 89%,  She was administered IV Lasix X 1 by the Facility ,  And given IV solumedrol in the ED.  In the Ed she was placed on BIPAP due to Respiratory distress and began to improve.      Review of Systems: Unable to Obtain from the Patient   Past Medical History  Diagnosis Date  . Arrhythmia   . Arthritis   . Atrial fibrillation     Remains stable (As of 05/09/13)  . H/O blood clots   . Hyperlipemia   . Dementia     Remains stable & continues to function adequately in the current living environment with supervision. Has had little changes in behavior (AS OF 05/09/13)  . GERD (gastroesophageal reflux disease)     Stable (As of 05/09/13)  . Osteoarthritis     Denies pain (As of 05/09/13)  . CHF (congestive heart failure)     chronic diastolic  . COPD (chronic obstructive pulmonary disease)     w/exacerbation     Past Surgical History  Procedure Laterality Date  . Appendectomy    . Gallbladder surgery    . Hip surgery      Right-Metal plate   . Leg surgery      Left leg      Prior to Admission medications   Medication Sig Start Date End Date Taking? Authorizing Provider  acetaminophen (TYLENOL) 500 MG tablet Take 500 mg by mouth 3 (three) times daily.    Yes Historical Provider, MD  budesonide-formoterol (SYMBICORT) 160-4.5 MCG/ACT inhaler Inhale 2 puffs into the lungs 2 (two) times daily.   Yes Historical Provider, MD  Calcium  Carbonate-Vitamin D (CALTRATE 600+D PO) Take 1 tablet by mouth every morning. For calcium supplement   Yes Historical Provider, MD  digoxin (LANOXIN) 0.125 MG tablet Take 0.125 mg by mouth every other day.   Yes Historical Provider, MD  digoxin (LANOXIN) 0.25 MG tablet Take 0.25 mg by mouth every other day. Alternate between  0.125 and 0.250   Yes Historical Provider, MD  diltiazem (CARDIZEM CD) 120 MG 24 hr capsule Take 120 mg by mouth daily. For A-Fib 07/02/13  Yes Bobby Rumpf York, PA-C  Docusate Sodium (DSS) 100 MG CAPS Take 100 mg by mouth daily. For constipation 07/02/13  Yes Bobby Rumpf York, PA-C  furosemide (LASIX) 40 MG tablet Take 40 mg by mouth 2 (two) times daily.   Yes Historical Provider, MD  Multiple Vitamins-Minerals (DECUBI-VITE) CAPS Take 1 capsule by mouth daily.   Yes Historical Provider, MD  Nutritional Supplements (NUTRITIONAL DRINK PO) Take 120 mLs by mouth 2 (two) times daily. MIGHTY SHAKES   Yes Historical Provider, MD  Polyethyl Glycol-Propyl Glycol (SYSTANE OP) Place 1 drop into both eyes 2 (two) times daily. For dry eyes   Yes Historical Provider, MD  potassium chloride SA (K-DUR,KLOR-CON) 20 MEQ tablet Take 20 mEq by mouth 2 (two) times daily.  Yes Historical Provider, MD  Protein (PROCEL) POWD Take 2 scoop by mouth 2 (two) times daily.   Yes Historical Provider, MD  Rivaroxaban (XARELTO) 20 MG TABS tablet Take 20 mg by mouth daily. For DVT   Yes Historical Provider, MD  furosemide (LASIX) 80 MG tablet Take 1 tablet (80 mg total) by mouth 2 (two) times daily. Patient not taking: Reported on 09/14/2014 08/21/14   Orson Eva, MD     Allergies  Allergen Reactions  . Chocolate Itching  . Ciprofloxacin Itching  . Coffee Bean Extract [Coffea Arabica] Itching  . Coffee Flavor Itching  . Gentamycin [Gentamicin] Itching  . Imodium [Loperamide] Other (See Comments)    unknown  . Penicillins Itching  . Sulfa Antibiotics Itching  . Tea Itching    Social History:  reports that  she has quit smoking. She has never used smokeless tobacco. She reports that she does not drink alcohol or use illicit drugs.    Family History  Problem Relation Age of Onset  . Colon cancer Neg Hx   . Heart disease Father   . Breast cancer Daughter        Physical Exam:  GEN:  Elderly Obese  81 y.o. Caucasian female examined and in mild distress; cooperative with exam Filed Vitals:   09/14/14 2315 09/14/14 2330 09/14/14 2345 09/15/14 0058  BP: 151/79 148/59 166/98   Pulse: 111 48 123   Temp:    99.1 F (37.3 C)  TempSrc:    Rectal  Resp: 32 24 30   SpO2: 91% 94% 95%    Blood pressure 166/98, pulse 123, temperature 99.1 F (37.3 C), temperature source Rectal, resp. rate 30, SpO2 95 %. PSYCH: She is alert and oriented x 3; does not appear anxious does not appear depressed; affect is normal HEENT: Normocephalic and Atraumatic, Mucous membranes pink; PERRLA; EOM intact; Fundi:  Benign;  No scleral icterus, Nares: Patent, Oropharynx: Clear, Edentulous wit Dentures Present,    Neck:  FROM, No Cervical Lymphadenopathy nor Thyromegaly or Carotid Bruit; No JVD; Breasts:: Not examined CHEST WALL: No tenderness CHEST: Decreased Breath sounds, No Rales No Rhonchi and No wheezes HEART: Irregular rate and rhythm; no murmurs rubs or gallops BACK: No kyphosis or scoliosis; No CVA tenderness ABDOMEN: Positive Bowel Sounds,  Obese, Soft Non-Tender, No Rebound or Guarding; No Masses, No Organomegaly. Rectal Exam: Not done EXTREMITIES: No  Cyanosis, Clubbing, or Edema; No Ulcerations. Genitalia: not examined PULSES: 2+ and symmetric SKIN: Normal hydration no rash or ulceration CNS:  Alert and Oriented x 3, Generalized Weakness but , No Focal Deficits Vascular: pulses palpable throughout    Labs on Admission:  Basic Metabolic Panel:  Recent Labs Lab 09/14/14 2203  NA 134*  K 4.4  CL 91*  CO2 23  GLUCOSE 199*  BUN 26*  CREATININE 0.64  CALCIUM 9.2   Liver Function Tests: No  results for input(s): AST, ALT, ALKPHOS, BILITOT, PROT, ALBUMIN in the last 168 hours. No results for input(s): LIPASE, AMYLASE in the last 168 hours. No results for input(s): AMMONIA in the last 168 hours. CBC:  Recent Labs Lab 09/14/14 2203  WBC 14.2*  NEUTROABS 10.3*  HGB 12.3  HCT 39.4  MCV 98.0  PLT 291   Cardiac Enzymes: No results for input(s): CKTOTAL, CKMB, CKMBINDEX, TROPONINI in the last 168 hours.  BNP (last 3 results)  Recent Labs  08/11/14 1740 08/16/14 0440 09/14/14 2300  BNP 25.7 147.8* 371.9*    ProBNP (last 3 results)  Recent Labs  09/19/13 1258 02/22/14 1417 06/17/14 1050  PROBNP 4084.0* 2244.0* 170.0*    CBG: No results for input(s): GLUCAP in the last 168 hours.  Radiological Exams on Admission: Dg Chest 2 View (if Patient Has Fever And/or Copd)  09/14/2014   CLINICAL DATA:  Dyspnea for 1 day.  EXAM: CHEST  2 VIEW  COMPARISON:  08/16/2014  FINDINGS: There is a large left pleural effusion, increased from 08/16/2014. There is a smaller right pleural effusion. There is marked unchanged cardiomegaly. Moderate vascular and interstitial congestion is present.  IMPRESSION: Large left pleural effusion and small right pleural effusion, increased from 08/16/2014. Moderate vascular and interstitial congestive changes are present.   Electronically Signed   By: Andreas Newport M.D.   On: 09/14/2014 23:18     EKG: Independently reviewed.    Assessment/Plan:   81 y.o. female with  Principal Problem:   1.   COPD exacerbation   DuoNebs   IV Steroids, Steroid Taper   BIPAP/ O2   Monitor O2 sats   Active Problems:  2.   Chronic diastolic congestive heart failure   CHF protocol    Diurese with Daily Lasix    Monitor electrolytes and BUN/Cr    3.  Pleural effusion, bilateral- due to #2 most Likely   May need repeat imaging to see if improved after CHF Rx    4.  Atrial fibrillation/Long term current use of anticoagulant therapy   Resume Diltiazem  and Digoxin  when taking PO   Continue Xarelto    5.  Hyperlipemia   Not Currently on Medication       6.   DVT Prophylaxis   On Xarelto Rx          Code Status:      DO NOT RESUSCITATE (DNR)      Family Communication:  2 Daughters at Bedside   No Family Present    Disposition Plan:    Inpatient Status        Time spent:  51 Minutes      Theressa Millard [facility] Pager [phone]   If [place] Please Contact the Day Rounding Team MD for [facility]  If 7PM-7AM, Please Contact Night-Floor Coverage  [email or web address] Password [email or web address] 09/15/2014, 1:09 AM     ADDENDUM:   Patient was seen and examined on 09/15/2014

## 2014-09-15 NOTE — Procedures (Signed)
Successful US guided left thoracentesis. Yielded 1 liter of clear yellow fluid. Pt tolerated procedure well. No immediate complications.  Specimen was sent for labs. CXR ordered.  Tsosie Billing D PA-C 09/15/2014 12:01 PM

## 2014-09-15 NOTE — Progress Notes (Signed)
[facility] PROGRESS NOTE  Kathelyn Gombos RJJ:[id number] DOB: [birth year] DOA: 09/14/2014 PCP: [name], MD  Assessment/Plan: 1. Acute on chronic hypoxemic respiratory failure -Likely secondary to chronic obstructive pulmonary disease exacerbation with superimposed large left sided pleural effusion. Chest x-ray performed on 09/14/2014 showing large left pleural effusion and small right pleural effusion, this has increased compared to prior study on 08/16/2014. -Will continue IV steroids with Solu-Medrol, scheduled duo nebs, empiric IV antibiotic therapy, consult interventional radiology for ultrasound-guided thoracentesis. -Will check a flu swab  2.  Chronic obstructive palmar disease exacerbation -Patient continues to have diminished breath sounds along with bilateral wheezing -Will schedule Solu-Medrol 40 mg IV every 6 hours, duo nebs every 4 hours, empiric antibiotic therapy with ceftriaxone and azithromycin for now -No obvious infiltrates were seen on chest x-ray. Repeat chest x-ray after thoracentesis.   3.  Left-sided pleural effusion. -I suspect this may be contributing to her hypoxemia. A chest x-ray performed on admission showed worsening large left-sided pleural effusion compared to prior study on 08/16/2014. -Interventional radiology consulted for ultrasound-guided thoracentesis. Will send fluid for analysis  4.  Chronic diastolic congestive heart failure -Last transthoracic echocardiogram performed on 08/16/2014 which showed a preserved ejection fraction of 50-55%. -She was given Lasix overnight -Labs showing a BNP of 371 -Follow volume status.  5.  Chronic atrial fibrillation -She remains rate controlled, will restart Cardizem 120 mg by mouth daily -Continue anticoagulation with Xarelto 20 mg daily  Code Status: DO NOT RESUSCITATE Family Communication: Spoke to her daughters were present at bedside Disposition Plan: Continue close monitoring in the step down  unit   Antibiotics:  Ceftriaxone  Azithromycin  HPI/Subjective: Patient is a pleasant 81 year old female with a past medical history of chronic obstructive pulmonary disease, chronic atrial fibrillation on anticoagulation, diastolic congestive heart failure who is admitted to the medicine service on 09/15/2014. She presented as a transfer from her skilled nursing facility with complaints of progressively worsening shortness of breath associate with cough and scant sputum production. In the emergency room she was found to be an acute on chronic hypoxemic respiratory failure requiring noninvasive positive pressure ventilation. She was started on IV steroids and admitted to the step down unit.  Objective: Filed Vitals:   09/15/14 0800  BP:   Pulse:   Temp: 97.5 F (36.4 C)  Resp:     Intake/Output Summary (Last 24 hours) at 09/15/14 0949 Last data filed at 09/15/14 0500  Gross per 24 hour  Intake      0 ml  Output     50 ml  Net    -50 ml   Filed Weights   09/15/14 0500  Weight: 86.2 kg (190 lb 0.6 oz)    Exam:   General:  Ill-appearing, in mild distress on 100% nonrebreather  Cardiovascular: Regular rate and rhythm normal S1-S2 no murmurs rubs gallops  Respiratory: She has diminished breath sounds bilaterally, more extensive on left, with bilateral exit tori wheezing, no crackles or rhonchi  Abdomen: Soft nontender nondistended  Musculoskeletal: No edema  Data Reviewed: Basic Metabolic Panel:  Recent Labs Lab 09/14/14 2203 09/15/14 0400  NA 134* 135  K 4.4 5.0  CL 91* 94*  CO2 23 30  GLUCOSE 199* 217*  BUN 26* 29*  CREATININE 0.64 0.64  CALCIUM 9.2 8.9   Liver Function Tests: No results for input(s): AST, ALT, ALKPHOS, BILITOT, PROT, ALBUMIN in the last 168 hours. No results for input(s): LIPASE, AMYLASE in the last 168 hours. No results for input(s):  AMMONIA in the last 168 hours. CBC:  Recent Labs Lab 09/14/14 2203 09/15/14 0400  WBC 14.2*  11.9*  NEUTROABS 10.3*  --   HGB 12.3 11.8*  HCT 39.4 36.8  MCV 98.0 95.3  PLT 291 250   Cardiac Enzymes: No results for input(s): CKTOTAL, CKMB, CKMBINDEX, TROPONINI in the last 168 hours. BNP (last 3 results)  Recent Labs  08/11/14 1740 08/16/14 0440 09/14/14 2300  BNP 25.7 147.8* 371.9*    ProBNP (last 3 results)  Recent Labs  09/19/13 1258 02/22/14 1417 06/17/14 1050  PROBNP 4084.0* 2244.0* 170.0*    CBG: No results for input(s): GLUCAP in the last 168 hours.  Recent Results (from the past 240 hour(s))  MRSA PCR Screening     Status: None   Collection Time: 09/15/14  2:25 AM  Result Value Ref Range Status   MRSA by PCR NEGATIVE NEGATIVE Final    Comment:        The GeneXpert MRSA Assay (FDA approved for NASAL specimens only), is one component of a comprehensive MRSA colonization surveillance program. It is not intended to diagnose MRSA infection nor to guide or monitor treatment for MRSA infections.      Studies: Dg Chest 2 View (if Patient Has Fever And/or Copd)  09/14/2014   CLINICAL DATA:  Dyspnea for 1 day.  EXAM: CHEST  2 VIEW  COMPARISON:  08/16/2014  FINDINGS: There is a large left pleural effusion, increased from 08/16/2014. There is a smaller right pleural effusion. There is marked unchanged cardiomegaly. Moderate vascular and interstitial congestion is present.  IMPRESSION: Large left pleural effusion and small right pleural effusion, increased from 08/16/2014. Moderate vascular and interstitial congestive changes are present.   Electronically Signed   By: Andreas Newport M.D.   On: 09/14/2014 23:18    Scheduled Meds: . antiseptic oral rinse  7 mL Mouth Rinse q12n4p  . azithromycin  500 mg Intravenous Q24H  . cefTRIAXone (ROCEPHIN)  IV  1 g Intravenous Q24H  . chlorhexidine  15 mL Mouth Rinse BID  . furosemide  40 mg Intravenous Daily  . guaiFENesin  1,200 mg Oral BID  . ipratropium-albuterol  3 mL Nebulization Q6H  . methylPREDNISolone  (SOLU-MEDROL) injection  40 mg Intravenous Q6H  . potassium chloride  10 mEq Oral Daily  . rivaroxaban  20 mg Oral Daily   Continuous Infusions:   Principal Problem:   COPD exacerbation Active Problems:   Long term current use of anticoagulant therapy   Atrial fibrillation   Acute respiratory failure   Chronic diastolic congestive heart failure   Pleural effusion, bilateral   Hyperlipemia    Time spent:     [place], [facility] Pager [phone]. If 7PM-7AM, please contact night-coverage at [email or web address], password [email or web address] 09/15/2014, 9:49 AM  LOS: 0 days

## 2014-09-15 NOTE — Progress Notes (Signed)
At 2013, pt assessed and given scheduled breathing treatment.  HR75, rr22, spo2 95% on 3lnc.  No increased wob or respiratory distress noted, bipap not indicated at this time.  RT will continue to monitor and assess pt for bipap.  RN aware.

## 2014-09-16 ENCOUNTER — Inpatient Hospital Stay (HOSPITAL_COMMUNITY): Payer: Medicare Other

## 2014-09-16 DIAGNOSIS — J9601 Acute respiratory failure with hypoxia: Secondary | ICD-10-CM

## 2014-09-16 LAB — URINALYSIS, ROUTINE W REFLEX MICROSCOPIC
Bilirubin Urine: NEGATIVE
Glucose, UA: NEGATIVE mg/dL
HGB URINE DIPSTICK: NEGATIVE
Ketones, ur: NEGATIVE mg/dL
LEUKOCYTES UA: NEGATIVE
NITRITE: NEGATIVE
Protein, ur: NEGATIVE mg/dL
Specific Gravity, Urine: 1.013 (ref 1.005–1.030)
UROBILINOGEN UA: 0.2 mg/dL (ref 0.0–1.0)
pH: 5 (ref 5.0–8.0)

## 2014-09-16 LAB — CBC
HEMATOCRIT: 33.1 % — AB (ref 36.0–46.0)
HEMOGLOBIN: 10.5 g/dL — AB (ref 12.0–15.0)
MCH: 30.3 pg (ref 26.0–34.0)
MCHC: 31.7 g/dL (ref 30.0–36.0)
MCV: 95.4 fL (ref 78.0–100.0)
Platelets: 254 10*3/uL (ref 150–400)
RBC: 3.47 MIL/uL — ABNORMAL LOW (ref 3.87–5.11)
RDW: 17 % — ABNORMAL HIGH (ref 11.5–15.5)
WBC: 8.7 10*3/uL (ref 4.0–10.5)

## 2014-09-16 LAB — BASIC METABOLIC PANEL
ANION GAP: 9 (ref 5–15)
BUN: 31 mg/dL — AB (ref 6–23)
CALCIUM: 8.5 mg/dL (ref 8.4–10.5)
CHLORIDE: 94 mmol/L — AB (ref 96–112)
CO2: 36 mmol/L — ABNORMAL HIGH (ref 19–32)
CREATININE: 0.51 mg/dL (ref 0.50–1.10)
GFR calc Af Amer: >90 mL/min (ref >90)
GFR calc non Af Amer: 83 mL/min — ABNORMAL LOW (ref >90)
Glucose, Bld: 145 mg/dL — ABNORMAL HIGH (ref 70–99)
Potassium: 3.8 mmol/L (ref 3.5–5.1)
Sodium: 139 mmol/L (ref 135–145)

## 2014-09-16 MED ORDER — PREDNISONE 20 MG PO TABS
40.0000 mg | ORAL_TABLET | Freq: Every day | ORAL | Status: DC
  Administered 2014-09-16 – 2014-09-17 (×2): 40 mg via ORAL
  Filled 2014-09-16 (×2): qty 2

## 2014-09-16 MED ORDER — FUROSEMIDE 80 MG PO TABS
80.0000 mg | ORAL_TABLET | Freq: Every day | ORAL | Status: DC
  Administered 2014-09-16 – 2014-09-18 (×3): 80 mg via ORAL
  Filled 2014-09-16 (×2): qty 2
  Filled 2014-09-16: qty 1

## 2014-09-16 NOTE — Progress Notes (Addendum)
[facility] PROGRESS NOTE  Jennifer Nielsen YPP:[id number] DOB: 24-Jul-1924 DOA: 09/14/2014 PCP: [name], MD  Assessment/Plan: 1. Acute on chronic hypoxemic respiratory failure -Likely secondary to chronic obstructive pulmonary disease exacerbation with superimposed large left sided pleural effusion. Chest x-ray performed on 09/14/2014 showing large left pleural effusion and small right pleural effusion, this has increased compared to prior study on 08/16/2014. -Patient undergoing ultrasound-guided thoracentesis on 09/15/2014 with removal of 1 L of clear yellow fluid. Patient tolerated procedure well. -She seems improved from a respiratory standpoint, did not require BiPAP overnight, currently satting 95% on 3 L supplemental oxygen. On physical exam she is breathing comfortably in no acute distress.  2.  Chronic obstructive palmar disease exacerbation -Patient presented with diminished breath sounds along with bilateral wheezing, clinically improved today -Given improvement on lung exam will discontinue Solu-Medrol and start prednisone 40 mg by mouth daily -Continue empiric antibiotic therapy with azithromycin and ceftriaxone  3.  Left-sided pleural effusion. -I suspect this may be contributing to her hypoxemia. A chest x-ray performed on admission showed worsening large left-sided pleural effusion compared to prior study on 08/16/2014. -On 09/15/2014 she underwent ultrasound-guided thoracentesis with removal of 1 L of fluid, pleural fluid analysis revealing pleural fluid protein to serum total protein ratio of 0.578, pleural fluid LDH to serum LDH of 0.886, pleural fluid glucose 168. She meets lytes criteria for exudative of effusion. Pending cytology. From a clinical standpoint she seems to be improving. Will follow-up on this mornings repeat chest x-ray.   4.  Mental status changes/acute encephalopathy -Yesterday afternoon patient having an episode of minimal responsiveness associate with  pinpoint pupils. Initially I  suspected episode had been medication induced, however, RN reassured me no narcotic analgesics/sedatives had been given on her shift -She improved over the course of the evening. -CT scan of brain was negative. ABG showed a PCO2 of 52.6 -Follow-up with MRI of brain today.   5.  Chronic diastolic congestive heart failure -Last transthoracic echocardiogram performed on 08/16/2014 which showed a preserved ejection fraction of 50-55%. -She was given Lasix overnight -Labs showing a BNP of 371 -Will transition her to Lasix 80 mg by mouth every a.m.  6.  Chronic atrial fibrillation -She remains rate controlled,  Cardizem 120 mg by mouth daily was restarted -Continue anticoagulation with Xarelto 20 mg daily  Code Status: DO NOT RESUSCITATE Family Communication: Spoke to her daughters were present at bedside Disposition Plan: Continue close monitoring in the step down unit   Antibiotics:  Ceftriaxone  Azithromycin  HPI/Subjective: Patient is a pleasant 81 year old female with a past medical history of chronic obstructive pulmonary disease, chronic atrial fibrillation on anticoagulation, diastolic congestive heart failure who is admitted to the medicine service on 09/15/2014. She presented as a transfer from her skilled nursing facility with complaints of progressively worsening shortness of breath associate with cough and scant sputum production. In the emergency room she was found to be an acute on chronic hypoxemic respiratory failure requiring noninvasive positive pressure ventilation. She was started on IV steroids and admitted to the step down unit.  Objective: Filed Vitals:   09/16/14 0600  BP: 120/51  Pulse: 61  Temp:   Resp: 20    Intake/Output Summary (Last 24 hours) at 09/16/14 0734 Last data filed at 09/16/14 0600  Gross per 24 hour  Intake    520 ml  Output   1325 ml  Net   -805 ml   Filed Weights   09/15/14 0500 09/16/14 0400  Weight:  86.2 kg (190 lb 0.6 oz) 86.3 kg (190 lb 4.1 oz)    Exam:   General:  Ill-appearing, in mild distress on 100% nonrebreather  Cardiovascular: Regular rate and rhythm normal S1-S2 no murmurs rubs gallops  Respiratory: She has diminished breath sounds bilaterally, more extensive on left, with bilateral exit tori wheezing, no crackles or rhonchi  Abdomen: Soft nontender nondistended  Musculoskeletal: No edema  Data Reviewed: Basic Metabolic Panel:  Recent Labs Lab 09/14/14 2203 09/15/14 0400 09/16/14 0400  NA 134* 135 139  K 4.4 5.0 3.8  CL 91* 94* 94*  CO2 23 30 36*  GLUCOSE 199* 217* 145*  BUN 26* 29* 31*  CREATININE 0.64 0.64 0.51  CALCIUM 9.2 8.9 8.5   Liver Function Tests:  Recent Labs Lab 09/15/14 0907  AST 28  ALT 28  ALKPHOS 82  BILITOT 0.3  PROT 6.4  ALBUMIN 2.6*   No results for input(s): LIPASE, AMYLASE in the last 168 hours. No results for input(s): AMMONIA in the last 168 hours. CBC:  Recent Labs Lab 09/14/14 2203 09/15/14 0400 09/16/14 0400  WBC 14.2* 11.9* 8.7  NEUTROABS 10.3*  --   --   HGB 12.3 11.8* 10.5*  HCT 39.4 36.8 33.1*  MCV 98.0 95.3 95.4  PLT 291 250 254   Cardiac Enzymes: No results for input(s): CKTOTAL, CKMB, CKMBINDEX, TROPONINI in the last 168 hours. BNP (last 3 results)  Recent Labs  08/11/14 1740 08/16/14 0440 09/14/14 2300  BNP 25.7 147.8* 371.9*    ProBNP (last 3 results)  Recent Labs  09/19/13 1258 02/22/14 1417 06/17/14 1050  PROBNP 4084.0* 2244.0* 170.0*    CBG: No results for input(s): GLUCAP in the last 168 hours.  Recent Results (from the past 240 hour(s))  MRSA PCR Screening     Status: None   Collection Time: 09/15/14  2:25 AM  Result Value Ref Range Status   MRSA by PCR NEGATIVE NEGATIVE Final    Comment:        The GeneXpert MRSA Assay (FDA approved for NASAL specimens only), is one component of a comprehensive MRSA colonization surveillance program. It is not intended to diagnose  MRSA infection nor to guide or monitor treatment for MRSA infections.   Body fluid culture     Status: None (Preliminary result)   Collection Time: 09/15/14 12:09 PM  Result Value Ref Range Status   Specimen Description PLEURAL LEFT  Final   Special Requests NONE  Final   Gram Stain   Final    FEW WBC PRESENT,BOTH PMN AND MONONUCLEAR NO SQUAMOUS EPITHELIAL CELLS SEEN NO ORGANISMS SEEN Performed at [facility]    Culture PENDING  Incomplete   Report Status PENDING  Incomplete     Studies: Dg Chest 2 View (if Patient Has Fever And/or Copd)  09/14/2014   CLINICAL DATA:  Dyspnea for 1 day.  EXAM: CHEST  2 VIEW  COMPARISON:  08/16/2014  FINDINGS: There is a large left pleural effusion, increased from 08/16/2014. There is a smaller right pleural effusion. There is marked unchanged cardiomegaly. Moderate vascular and interstitial congestion is present.  IMPRESSION: Large left pleural effusion and small right pleural effusion, increased from 08/16/2014. Moderate vascular and interstitial congestive changes are present.   Electronically Signed   By: Andreas Newport M.D.   On: 09/14/2014 23:18   Ct Head Wo Contrast  09/15/2014   CLINICAL DATA:  Mental status changes with lethargy and limited arousal. Acute encephalopathy.  EXAM: CT HEAD WITHOUT  CONTRAST  TECHNIQUE: Contiguous axial images were obtained from the base of the skull through the vertex without intravenous contrast.  COMPARISON:  Head CT 08/11/2014.  FINDINGS: Some images were repeated due to motion.  There is no evidence of acute intracranial hemorrhage, mass lesion, brain edema or extra-axial fluid collection. The ventricles and subarachnoid spaces are mildly prominent but stable. There is no CT evidence of acute cortical infarction. Mild periventricular white matter disease appears unchanged. Intracranial vascular calcifications noted.  The visualized paranasal sinuses, mastoid air cells and middle ears are clear. The calvarium  is intact.  IMPRESSION: Stable examination demonstrating no acute findings. Stable atrophy and mild periventricular white matter disease.   Electronically Signed   By: Richardean Sale M.D.   On: 09/15/2014 16:27   Dg Chest Port 1 View  09/15/2014   CLINICAL DATA:  Status post left thoracentesis  EXAM: PORTABLE CHEST - 1 VIEW  COMPARISON:  09/14/2014  FINDINGS: Significant improvement in the left effusion following thoracentesis. No pneumothorax. Small effusions remain with bibasilar collapse/ consolidation. Heart is enlarged. Atherosclerosis of the aorta. Degenerative changes of the spine and shoulders.  IMPRESSION: Improvement in the left effusion following thoracentesis. No pneumothorax.   Electronically Signed   By: Jerilynn Mages.  Shick M.D.   On: 09/15/2014 12:46   US Thoracentesis Asp Pleural Space W/img Guide  09/15/2014   INDICATION: Symptomatic bilateral effusions left greater than right.  EXAM: US THORACENTESIS ASP PLEURAL SPACE W/IMG GUIDE  COMPARISON:  CXR 09/14/14.  MEDICATIONS: None  COMPLICATIONS: None immediate  TECHNIQUE: Informed written consent was obtained from the patient's family after a discussion of the risks, benefits and alternatives to treatment. A timeout was performed prior to the initiation of the procedure.  Initial ultrasound scanning demonstrates a left pleural effusion. The lower chest was prepped and draped in the usual sterile fashion. 1% lidocaine was used for local anesthesia.  Under direct ultrasound guidance, a 19 gauge, 7-cm, Yueh catheter was introduced. An ultrasound image was saved for documentation purposes. The thoracentesis was performed. The catheter was removed and a dressing was applied. The patient tolerated the procedure well without immediate post procedural complication. The patient was escorted to have an upright chest radiograph.  FINDINGS: A total of approximately 1 liter of serous fluid was removed. Requested samples were sent to the laboratory.  IMPRESSION: Successful  ultrasound-guided left sided thoracentesis yielding 1 liter of pleural fluid.  Read By:  Tsosie Billing PA-C   Electronically Signed   By: Jerilynn Mages.  Shick M.D.   On: 09/15/2014 12:12    Scheduled Meds: . antiseptic oral rinse  7 mL Mouth Rinse q12n4p  . azithromycin  500 mg Intravenous Q24H  . cefTRIAXone (ROCEPHIN)  IV  1 g Intravenous Q24H  . chlorhexidine  15 mL Mouth Rinse BID  . diltiazem  120 mg Oral Daily  . furosemide  80 mg Oral Daily  . guaiFENesin  1,200 mg Oral BID  . ipratropium-albuterol  3 mL Nebulization Q6H  . potassium chloride  10 mEq Oral Daily  . predniSONE  40 mg Oral Q breakfast  . rivaroxaban  20 mg Oral Daily   Continuous Infusions:   Principal Problem:   COPD exacerbation Active Problems:   Long term current use of anticoagulant therapy   Atrial fibrillation   Acute respiratory failure   Chronic diastolic congestive heart failure   Pleural effusion, bilateral   Hyperlipemia    Time spent: 35 min    Mike Hamre  [facility] Pager  [phone]. If 7PM-7AM, please contact night-coverage at [email or web address], password [facility] 09/16/2014, 7:34 AM  LOS: 1 day

## 2014-09-16 NOTE — Progress Notes (Signed)
Pt has no noted increased WOB.  Pt resting comfortably.  BiPAP not needed at this time

## 2014-09-16 NOTE — Progress Notes (Signed)
Pt asleep, HR72, rr20, spo2 96% on 3lnc.  No increased wob or respiratory distress noted.  Bipap not indicated at this time but remains in room on standby.

## 2014-09-16 NOTE — Progress Notes (Signed)
Clinical Social Work Department BRIEF PSYCHOSOCIAL ASSESSMENT 09/16/2014  Patient:  Jennifer Nielsen, Jennifer Nielsen     Account Number:  [id number]     Admit date:  09/14/2014  Clinical Social Worker:  Maryln Manuel  Date/Time:  09/16/2014 02:30 PM  Referred by:  Physician  Date Referred:  09/16/2014 Referred for  SNF Placement   Other Referral:   Interview type:  Family Other interview type:    PSYCHOSOCIAL DATA Living Status:  FACILITY Admitted from facility:  [facility] Level of care:  [place] Primary support name:  [name] Griffin/daughter/[phone] Primary support relationship to patient:  CHILD, ADULT Degree of support available:   strong    CURRENT CONCERNS Current Concerns  Post-Acute Placement   Other Concerns:    SOCIAL WORK ASSESSMENT / PLAN CSW received referral that pt admitted from [facility]      CSW met with pt daughter, [name] at bedside. Pt daughter reports that pt is currently in MRI. CSW introduced self and explained role. Pt daughter confirmed that pt is a resident at [facility]. Pt daughter shared that pt is a long term resident at [facility] and has been at facility for 3 to 4 years. CSW provided support to pt daughter as pt daughter discussed that she feels that pt is slowly progressing, but pt had a lot of fluid build up which required fluid to be drawn off of pt yesterday. Pt daughter shared that [facility] has taken good care of pt and pt daughter is agreeable to providing [facility] with updates.      CSW completed FL2 and sent clinicals to [facility]. CSW spoke with [facility] who stated that pt is a long term resident at the facility. Per [facility], pt was essentially total care prior to admission, but was able to feed herself.      CSW to continue to follow to provide support and assist with disposition needs. Pt daughter reports that RN reported to her that pt may move to regular unit tomorrow.   Assessment/plan status:   Psychosocial Support/Ongoing Assessment of Needs Other assessment/ plan:   discharge planning   Information/referral to community resources:   Referral back to [place]: Per chart, pt alert and oriented x 2. Pt currently in MRI. Pt daughter supportive and actively involved in pt care evidenced by remaining at bedside to support pt. Pt daughter reports satisfaction with pt care at [facility]. Pt daughter expressed concern about pt having fluid build up, but was pleased that fluid could be removed yesterday and feels that pt is making progress.  Support provided.    Alison Murray, MSW, [place] Work [phone]

## 2014-09-17 LAB — BASIC METABOLIC PANEL
Anion gap: 10 (ref 5–15)
BUN: 34 mg/dL — AB (ref 6–23)
CALCIUM: 8.2 mg/dL — AB (ref 8.4–10.5)
CO2: 40 mmol/L (ref 19–32)
CREATININE: 0.56 mg/dL (ref 0.50–1.10)
Chloride: 88 mmol/L — ABNORMAL LOW (ref 96–112)
GFR calc Af Amer: >90 mL/min (ref >90)
GFR calc non Af Amer: 81 mL/min — ABNORMAL LOW (ref >90)
GLUCOSE: 133 mg/dL — AB (ref 70–99)
Potassium: 3.2 mmol/L — ABNORMAL LOW (ref 3.5–5.1)
Sodium: 138 mmol/L (ref 135–145)

## 2014-09-17 LAB — CBC
HEMATOCRIT: 33.9 % — AB (ref 36.0–46.0)
Hemoglobin: 10.8 g/dL — ABNORMAL LOW (ref 12.0–15.0)
MCH: 30.6 pg (ref 26.0–34.0)
MCHC: 31.9 g/dL (ref 30.0–36.0)
MCV: 96 fL (ref 78.0–100.0)
PLATELETS: 311 10*3/uL (ref 150–400)
RBC: 3.53 MIL/uL — ABNORMAL LOW (ref 3.87–5.11)
RDW: 16.9 % — AB (ref 11.5–15.5)
WBC: 8.7 10*3/uL (ref 4.0–10.5)

## 2014-09-17 MED ORDER — PREDNISONE 20 MG PO TABS
30.0000 mg | ORAL_TABLET | Freq: Every day | ORAL | Status: DC
  Administered 2014-09-18: 30 mg via ORAL
  Filled 2014-09-17 (×2): qty 1

## 2014-09-17 MED ORDER — IPRATROPIUM-ALBUTEROL 0.5-2.5 (3) MG/3ML IN SOLN: 3.0000 mL | RESPIRATORY_TRACT | Status: DC | PRN

## 2014-09-17 MED ORDER — DOXYCYCLINE HYCLATE 100 MG PO TABS
100.0000 mg | ORAL_TABLET | Freq: Two times a day (BID) | ORAL | Status: DC
  Administered 2014-09-17 – 2014-09-18 (×3): 100 mg via ORAL
  Filled 2014-09-17 (×4): qty 1

## 2014-09-17 MED ORDER — POTASSIUM CHLORIDE CRYS ER 20 MEQ PO TBCR
40.0000 meq | EXTENDED_RELEASE_TABLET | Freq: Four times a day (QID) | ORAL | Status: AC
  Administered 2014-09-17 (×2): 40 meq via ORAL
  Filled 2014-09-17 (×2): qty 2

## 2014-09-17 NOTE — Progress Notes (Signed)
CRITICAL VALUE ALERT  Critical value received:  C O 2  Date of notification:  09/17/14   Time of notification:  7915 AM   Critical value read back Yes   Nurse who received alert:  Clyda Greener   MD notified (1st page): Lamar Blinks NP    Time of first page:  0451  MD notified (2nd page):  Time of second page:  Responding MD:   Time MD responded:

## 2014-09-17 NOTE — Progress Notes (Signed)
Called report to Maudie Mercury, RN on [place].

## 2014-09-17 NOTE — Progress Notes (Addendum)
[facility] PROGRESS NOTE  Jennifer Nielsen MGQ:[id number] DOB: 12-May-1925 DOA: 09/14/2014 PCP: [name], MD  Interim Summary Patient is a pleasant 81 year old female with a past medical history of chronic obstructive pulmonary disease, chronic atrial fibrillation on anticoagulation, diastolic congestive heart failure who is admitted to the medicine service on 09/15/2014. She presented as a transfer from her skilled nursing facility with complaints of progressively worsening shortness of breath associate with cough and scant sputum production. In the emergency room she was found to be an acute on chronic hypoxemic respiratory failure requiring noninvasive positive pressure ventilation. She was started on IV steroids and admitted to the step down unit. On 09/15/2014 she underwent ultrasound-guided thoracentesis with removal of 1 L of clear yellow fluid. From a respiratory standpoint she had significant improvement as she has not required bipap since admission. Repeat chest x-ray on the following day showed improvement to pleural effusion and did not show infiltrate.   Assessment/Plan: 1. Acute on chronic hypoxemic respiratory failure -Likely secondary to chronic obstructive pulmonary disease exacerbation with superimposed large left sided pleural effusion. Chest x-ray performed on 09/14/2014 showing large left pleural effusion and small right pleural effusion, this has increased compared to prior study on 09/15/2014. -Patient undergoing ultrasound-guided thoracentesis on 09/15/2014 with removal of 1 L of clear yellow fluid. Patient tolerated procedure well. -She seems improved from a respiratory standpoint, and has not required bipap for the last 24 hours.  -Transfer to Med/Surg  2.  Chronic obstructive palmar disease exacerbation -Patient presented with diminished breath sounds along with bilateral wheezing, clinically improved t -She was transitioned from IV steroid to Prednisone taper.  -Will  stop IV antimicrobial therapy and switch her to Doxy.   3.  Left-sided pleural effusion. -I suspect this may be contributing to her hypoxemia. A chest x-ray performed on admission showed worsening large left-sided pleural effusion compared to prior study on 08/16/2014. -On 09/15/2014 she underwent ultrasound-guided thoracentesis with removal of 1 L of fluid, pleural fluid analysis revealing pleural fluid protein to serum total protein ratio of 0.578, pleural fluid LDH to serum LDH of 0.886, pleural fluid glucose 168. She meets lytes criteria for exudative of effusion. Pending cytology. From a clinical standpoint she seems to be improving.  -Pleuritic fluid cultures showing no growth to date.  -Repeat CXR did not show infiltrate.   4.  Mental status changes/acute encephalopathy -On 09/15/2014 patient having an episode of minimal responsiveness associate with pinpoint pupils. Initially I  suspected episode had been medication induced, however, RN reassured me no narcotic analgesics/sedatives had been given on her shift -She improved over the course of the evening. -CT scan of brain was negative. ABG showed a PCO2 of 52.6 -MRI of brain did not show acute infarct.   5.  Chronic diastolic congestive heart failure -Last transthoracic echocardiogram performed on 08/16/2014 which showed a preserved ejection fraction of 50-55%. -She was given Lasix overnight -Labs showing a BNP of 371 -Now on Lasix 80 mg by mouth every a.m.  6.  Chronic atrial fibrillation -She remains rate controlled,  Cardizem 120 mg by mouth daily was restarted -Continue anticoagulation with Xarelto 20 mg daily  7. Hypokalemia -Labs showing K of 3.2 -Will replace with Kdur 40 meq PO x 2 doses  Code Status: DO NOT RESUSCITATE Family Communication: Spoke to her daughters were present at bedside Disposition Plan: Will transfer out of SDU to med/surg   Antibiotics:  Ceftriaxone  Azithromycin  HPI/Subjective: No acute  events overnight, patient is awake and alert  this morning states feeling better, breathing easier, tolerating by mouth intake.  Objective: Filed Vitals:   09/17/14 0451  BP: 124/55  Pulse:   Temp: 98.1 F (36.7 C)  Resp:     Intake/Output Summary (Last 24 hours) at 09/17/14 0745 Last data filed at 09/17/14 0645  Gross per 24 hour  Intake    420 ml  Output   1450 ml  Net  -1030 ml   Filed Weights   09/15/14 0500 09/16/14 0400 09/17/14 0500  Weight: 86.2 kg (190 lb 0.6 oz) 86.3 kg (190 lb 4.1 oz) 84.2 kg (185 lb 10 oz)    Exam:   General:  Patient is awake and alert, breathing comfortably, no acute distress  Cardiovascular: Regular rate and rhythm normal S1-S2 no murmurs rubs gallops  Respiratory: Improved lung sounds on exam, good air movement, no wheezing rhonchi or rales noted  Abdomen: Soft nontender nondistended  Musculoskeletal: No edema  Data Reviewed: Basic Metabolic Panel:  Recent Labs Lab 09/14/14 2203 09/15/14 0400 09/16/14 0400 09/17/14 0340  NA 134* 135 139 138  K 4.4 5.0 3.8 3.2*  CL 91* 94* 94* 88*  CO2 23 30 36* 40*  GLUCOSE 199* 217* 145* 133*  BUN 26* 29* 31* 34*  CREATININE 0.64 0.64 0.51 0.56  CALCIUM 9.2 8.9 8.5 8.2*   Liver Function Tests:  Recent Labs Lab 09/15/14 0907  AST 28  ALT 28  ALKPHOS 82  BILITOT 0.3  PROT 6.4  ALBUMIN 2.6*   No results for input(s): LIPASE, AMYLASE in the last 168 hours. No results for input(s): AMMONIA in the last 168 hours. CBC:  Recent Labs Lab 09/14/14 2203 09/15/14 0400 09/16/14 0400 09/17/14 0340  WBC 14.2* 11.9* 8.7 8.7  NEUTROABS 10.3*  --   --   --   HGB 12.3 11.8* 10.5* 10.8*  HCT 39.4 36.8 33.1* 33.9*  MCV 98.0 95.3 95.4 96.0  PLT 291 250 254 311   Cardiac Enzymes: No results for input(s): CKTOTAL, CKMB, CKMBINDEX, TROPONINI in the last 168 hours. BNP (last 3 results)  Recent Labs  08/11/14 1740 08/16/14 0440 09/14/14 2300  BNP 25.7 147.8* 371.9*    ProBNP (last 3  results)  Recent Labs  09/19/13 1258 02/22/14 1417 06/17/14 1050  PROBNP 4084.0* 2244.0* 170.0*    CBG: No results for input(s): GLUCAP in the last 168 hours.  Recent Results (from the past 240 hour(s))  MRSA PCR Screening     Status: None   Collection Time: 09/15/14  2:25 AM  Result Value Ref Range Status   MRSA by PCR NEGATIVE NEGATIVE Final    Comment:        The GeneXpert MRSA Assay (FDA approved for NASAL specimens only), is one component of a comprehensive MRSA colonization surveillance program. It is not intended to diagnose MRSA infection nor to guide or monitor treatment for MRSA infections.   Body fluid culture     Status: None (Preliminary result)   Collection Time: 09/15/14 12:09 PM  Result Value Ref Range Status   Specimen Description PLEURAL LEFT  Final   Special Requests NONE  Final   Gram Stain   Final    FEW WBC PRESENT,BOTH PMN AND MONONUCLEAR NO SQUAMOUS EPITHELIAL CELLS SEEN NO ORGANISMS SEEN Performed at [facility]    Culture NO GROWTH Performed at [facility]   Final   Report Status PENDING  Incomplete     Studies: Dg Chest 2 View  09/16/2014   CLINICAL  DATA:  Shortness of breath. Recent left-sided thoracentesis.  EXAM: CHEST  2 VIEW  COMPARISON:  09/14/2014  FINDINGS: The heart is enlarged but stable. Smaller left-sided pleural effusion status post thoracentesis. There is a stable small right pleural effusion. No definite pneumothorax. Probable skin folds over the left chest as there are lung markings beyond. Persistent central vascular congestion and bibasilar atelectasis.  IMPRESSION: Decrease in size of the left-sided pleural effusion.  Stable cardiac enlargement.  Small effusions and bibasilar atelectasis.  Stable vascular congestion without overt pulmonary edema.   Electronically Signed   By: Marijo Sanes M.D.   On: 09/16/2014 16:14   Ct Head Wo Contrast  09/15/2014   CLINICAL DATA:  Mental status changes with lethargy  and limited arousal. Acute encephalopathy.  EXAM: CT HEAD WITHOUT CONTRAST  TECHNIQUE: Contiguous axial images were obtained from the base of the skull through the vertex without intravenous contrast.  COMPARISON:  Head CT 08/11/2014.  FINDINGS: Some images were repeated due to motion.  There is no evidence of acute intracranial hemorrhage, mass lesion, brain edema or extra-axial fluid collection. The ventricles and subarachnoid spaces are mildly prominent but stable. There is no CT evidence of acute cortical infarction. Mild periventricular white matter disease appears unchanged. Intracranial vascular calcifications noted.  The visualized paranasal sinuses, mastoid air cells and middle ears are clear. The calvarium is intact.  IMPRESSION: Stable examination demonstrating no acute findings. Stable atrophy and mild periventricular white matter disease.   Electronically Signed   By: Richardean Sale M.D.   On: 09/15/2014 16:27   Mr Brain Wo Contrast  09/16/2014   CLINICAL DATA:  Episode of minimal responsiveness, becoming of nonverbal and pinpoint pupils. When she recovered, there was difficulty speaking. Initial encounter.  EXAM: MRI HEAD WITHOUT CONTRAST  TECHNIQUE: Multiplanar, multiecho pulse sequences of the brain and surrounding structures were obtained without intravenous contrast.  COMPARISON:  CT head 09/15/2014.  FINDINGS: No evidence for acute infarction, hemorrhage, mass lesion, or extra-axial fluid. Generalized atrophy. Hydrocephalus ex vacuo. Moderately advanced small vessel disease affecting not only supratentorial compartment but the brainstem. Pituitary, pineal, and cerebellar tonsils unremarkable. No upper cervical lesions. Flow voids are maintained throughout the carotid, basilar, and vertebral arteries. There are no areas of chronic hemorrhage. Visualized calvarium, skull base, and upper cervical osseous structures unremarkable. Scalp and extracranial soft tissues, orbits, sinuses, and mastoids  show no acute process.  IMPRESSION: Chronic changes as described.  No acute intracranial abnormality.   Electronically Signed   By: Rolla Flatten M.D.   On: 09/16/2014 14:56   Dg Chest Port 1 View  09/15/2014   CLINICAL DATA:  Status post left thoracentesis  EXAM: PORTABLE CHEST - 1 VIEW  COMPARISON:  09/14/2014  FINDINGS: Significant improvement in the left effusion following thoracentesis. No pneumothorax. Small effusions remain with bibasilar collapse/ consolidation. Heart is enlarged. Atherosclerosis of the aorta. Degenerative changes of the spine and shoulders.  IMPRESSION: Improvement in the left effusion following thoracentesis. No pneumothorax.   Electronically Signed   By: Jerilynn Mages.  Shick M.D.   On: 09/15/2014 12:46   US Thoracentesis Asp Pleural Space W/img Guide  09/15/2014   INDICATION: Symptomatic bilateral effusions left greater than right.  EXAM: US THORACENTESIS ASP PLEURAL SPACE W/IMG GUIDE  COMPARISON:  CXR 09/14/14.  MEDICATIONS: None  COMPLICATIONS: None immediate  TECHNIQUE: Informed written consent was obtained from the patient's family after a discussion of the risks, benefits and alternatives to treatment. A timeout was performed prior to the initiation  of the procedure.  Initial ultrasound scanning demonstrates a left pleural effusion. The lower chest was prepped and draped in the usual sterile fashion. 1% lidocaine was used for local anesthesia.  Under direct ultrasound guidance, a 19 gauge, 7-cm, Yueh catheter was introduced. An ultrasound image was saved for documentation purposes. The thoracentesis was performed. The catheter was removed and a dressing was applied. The patient tolerated the procedure well without immediate post procedural complication. The patient was escorted to have an upright chest radiograph.  FINDINGS: A total of approximately 1 liter of serous fluid was removed. Requested samples were sent to the laboratory.  IMPRESSION: Successful ultrasound-guided left sided  thoracentesis yielding 1 liter of pleural fluid.  Read By:  Tsosie Billing PA-C   Electronically Signed   By: Jerilynn Mages.  Shick M.D.   On: 09/15/2014 12:12    Scheduled Meds: . antiseptic oral rinse  7 mL Mouth Rinse q12n4p  . azithromycin  500 mg Intravenous Q24H  . cefTRIAXone (ROCEPHIN)  IV  1 g Intravenous Q24H  . chlorhexidine  15 mL Mouth Rinse BID  . diltiazem  120 mg Oral Daily  . furosemide  80 mg Oral Daily  . guaiFENesin  1,200 mg Oral BID  . ipratropium-albuterol  3 mL Nebulization Q6H  . potassium chloride  10 mEq Oral Daily  . potassium chloride  40 mEq Oral Q6H  . predniSONE  40 mg Oral Q breakfast  . rivaroxaban  20 mg Oral Daily   Continuous Infusions:   Principal Problem:   COPD exacerbation Active Problems:   Long term current use of anticoagulant therapy   Atrial fibrillation   Acute respiratory failure   Chronic diastolic congestive heart failure   Pleural effusion, bilateral   Hyperlipemia    Time spent: 35 min    Kelvin Cellar  [facility] Pager [phone]. If 7PM-7AM, please contact night-coverage at [email or web address], password [facility] 09/17/2014, 7:45 AM  LOS: 2 days

## 2014-09-18 DIAGNOSIS — L602 Onychogryphosis: Secondary | ICD-10-CM | POA: Diagnosis not present

## 2014-09-18 DIAGNOSIS — J9622 Acute and chronic respiratory failure with hypercapnia: Secondary | ICD-10-CM | POA: Diagnosis not present

## 2014-09-18 DIAGNOSIS — R531 Weakness: Secondary | ICD-10-CM | POA: Diagnosis not present

## 2014-09-18 DIAGNOSIS — E876 Hypokalemia: Secondary | ICD-10-CM | POA: Diagnosis not present

## 2014-09-18 DIAGNOSIS — I509 Heart failure, unspecified: Secondary | ICD-10-CM | POA: Diagnosis not present

## 2014-09-18 DIAGNOSIS — M1990 Unspecified osteoarthritis, unspecified site: Secondary | ICD-10-CM | POA: Diagnosis not present

## 2014-09-18 DIAGNOSIS — G9340 Encephalopathy, unspecified: Secondary | ICD-10-CM | POA: Diagnosis not present

## 2014-09-18 DIAGNOSIS — J9610 Chronic respiratory failure, unspecified whether with hypoxia or hypercapnia: Secondary | ICD-10-CM | POA: Diagnosis not present

## 2014-09-18 DIAGNOSIS — E46 Unspecified protein-calorie malnutrition: Secondary | ICD-10-CM | POA: Diagnosis not present

## 2014-09-18 DIAGNOSIS — Z7901 Long term (current) use of anticoagulants: Secondary | ICD-10-CM | POA: Diagnosis not present

## 2014-09-18 DIAGNOSIS — J90 Pleural effusion, not elsewhere classified: Secondary | ICD-10-CM | POA: Diagnosis not present

## 2014-09-18 DIAGNOSIS — R278 Other lack of coordination: Secondary | ICD-10-CM | POA: Diagnosis not present

## 2014-09-18 DIAGNOSIS — J449 Chronic obstructive pulmonary disease, unspecified: Secondary | ICD-10-CM | POA: Diagnosis not present

## 2014-09-18 DIAGNOSIS — F0390 Unspecified dementia without behavioral disturbance: Secondary | ICD-10-CM | POA: Diagnosis not present

## 2014-09-18 DIAGNOSIS — R5381 Other malaise: Secondary | ICD-10-CM | POA: Diagnosis not present

## 2014-09-18 DIAGNOSIS — J441 Chronic obstructive pulmonary disease with (acute) exacerbation: Secondary | ICD-10-CM | POA: Diagnosis not present

## 2014-09-18 DIAGNOSIS — L97429 Non-pressure chronic ulcer of left heel and midfoot with unspecified severity: Secondary | ICD-10-CM | POA: Diagnosis not present

## 2014-09-18 DIAGNOSIS — E43 Unspecified severe protein-calorie malnutrition: Secondary | ICD-10-CM | POA: Diagnosis not present

## 2014-09-18 DIAGNOSIS — I4891 Unspecified atrial fibrillation: Secondary | ICD-10-CM | POA: Diagnosis not present

## 2014-09-18 DIAGNOSIS — K210 Gastro-esophageal reflux disease with esophagitis: Secondary | ICD-10-CM | POA: Diagnosis not present

## 2014-09-18 DIAGNOSIS — I499 Cardiac arrhythmia, unspecified: Secondary | ICD-10-CM | POA: Diagnosis not present

## 2014-09-18 DIAGNOSIS — J9601 Acute respiratory failure with hypoxia: Secondary | ICD-10-CM | POA: Diagnosis not present

## 2014-09-18 DIAGNOSIS — E785 Hyperlipidemia, unspecified: Secondary | ICD-10-CM | POA: Diagnosis not present

## 2014-09-18 DIAGNOSIS — I482 Chronic atrial fibrillation: Secondary | ICD-10-CM | POA: Diagnosis not present

## 2014-09-18 DIAGNOSIS — J9600 Acute respiratory failure, unspecified whether with hypoxia or hypercapnia: Secondary | ICD-10-CM | POA: Diagnosis not present

## 2014-09-18 DIAGNOSIS — I5032 Chronic diastolic (congestive) heart failure: Secondary | ICD-10-CM | POA: Diagnosis not present

## 2014-09-18 DIAGNOSIS — I8290 Acute embolism and thrombosis of unspecified vein: Secondary | ICD-10-CM | POA: Diagnosis not present

## 2014-09-18 LAB — BASIC METABOLIC PANEL
ANION GAP: 8 (ref 5–15)
BUN: 27 mg/dL — AB (ref 6–23)
CO2: 40 mmol/L (ref 19–32)
CREATININE: 0.56 mg/dL (ref 0.50–1.10)
Calcium: 8.1 mg/dL — ABNORMAL LOW (ref 8.4–10.5)
Chloride: 93 mmol/L — ABNORMAL LOW (ref 96–112)
GFR calc non Af Amer: 81 mL/min — ABNORMAL LOW (ref >90)
Glucose, Bld: 116 mg/dL — ABNORMAL HIGH (ref 70–99)
Potassium: 4.1 mmol/L (ref 3.5–5.1)
Sodium: 141 mmol/L (ref 135–145)

## 2014-09-18 LAB — CBC
HCT: 35.5 % — ABNORMAL LOW (ref 36.0–46.0)
Hemoglobin: 11.1 g/dL — ABNORMAL LOW (ref 12.0–15.0)
MCH: 30.3 pg (ref 26.0–34.0)
MCHC: 31.3 g/dL (ref 30.0–36.0)
MCV: 97 fL (ref 78.0–100.0)
Platelets: 285 10*3/uL (ref 150–400)
RBC: 3.66 MIL/uL — ABNORMAL LOW (ref 3.87–5.11)
RDW: 16.9 % — AB (ref 11.5–15.5)
WBC: 9.5 10*3/uL (ref 4.0–10.5)

## 2014-09-18 LAB — BODY FLUID CULTURE: Culture: NO GROWTH

## 2014-09-18 MED ORDER — GUAIFENESIN ER 600 MG PO TB12: 600.0000 mg | ORAL_TABLET | Freq: Two times a day (BID) | ORAL | Status: DC

## 2014-09-18 MED ORDER — DOXYCYCLINE HYCLATE 100 MG PO TABS: 100.0000 mg | ORAL_TABLET | Freq: Two times a day (BID) | ORAL | Status: DC

## 2014-09-18 MED ORDER — IPRATROPIUM-ALBUTEROL 0.5-2.5 (3) MG/3ML IN SOLN: RESPIRATORY_TRACT | Status: DC

## 2014-09-18 MED ORDER — PREDNISONE 20 MG PO TABS: ORAL_TABLET | ORAL | Status: DC

## 2014-09-18 NOTE — Progress Notes (Signed)
Report called to Kathlee Nations, Therapist, sports at [facility].

## 2014-09-18 NOTE — Progress Notes (Addendum)
CO2 with am labs 40 mml/l (just as was on yesterday's labs as well).

## 2014-09-18 NOTE — Discharge Instructions (Signed)
Pleural Effusion °The lining covering your lungs and the inside of your chest is called the pleura. Usually, the space between the two pleura contains no air and only a thin layer of fluid. A pleural effusion is an abnormal buildup of fluid in the pleural space. °Fluid gathers when there is increased pressure in the lung vessels. This forces fluids out of the lungs and into the pleural space. Vessels may also leak fluids when there are infections, such as pneumonia, or other causes of soreness and redness (inflammation). Fluids leak into the lungs when protein in the blood is low or when certain vessels (lymphatics) are blocked. °Finding a pleural effusion is important because it is usually caused by another disease. In order to treat a pleural effusion, your health care provider needs to find its cause. If left untreated, a large amount of fluid can build up and cause collapse of the lung. °CAUSES  °· Heart failure. °· Infections (pneumonia, tuberculosis), pulmonary embolism, pulmonary infarction. °· Cancer (primary lung and metastatic), asbestosis. °· Liver failure (cirrhosis). °· Nephrotic syndrome, peritoneal dialysis, kidney problems (uremia). °· Collagen vascular disease (systemic lupus erythematosus, rheumatoid arthritis). °· Injury (trauma) to the chest or rupture of the digestive tube (esophagus). °· Material in the chest or pleural space (hemothorax, chylothorax). °· Pancreatitis. °· Surgery. °· Drug reactions. °SYMPTOMS  °A pleural effusion can decrease the amount of space available for breathing and make you short of breath. The fluid can become infected, which may cause pain and fever. Often, the pain is worse when taking a deep breath. The underlying disease (heart failure, pneumonia, blood clot, tuberculosis, cancer) may also cause symptoms. °DIAGNOSIS  °· Your health care provider can usually tell what is wrong by talking to you (taking a history), doing an exam, and taking a routine X-ray. If the  X-ray shows fluid in your chest, often fluid is removed from your chest with a needle for testing (diagnostic thoracentesis). °· Sometimes, more specialized X-rays may be needed. °· Sometimes, a small piece of tissue is removed and examined by a specialist (biopsy). °TREATMENT  °Treatment varies based on what caused the pleural effusion. Treatments include: °· Removing as much fluid as possible using a needle (thoracentesis) to improve the cough and shortness of breath. This is a simple procedure that can be done at bedside. The risks are bleeding, infection, collapse of a lung, or low blood pressure. °· Placing a tube in the chest to drain the effusion (tube thoracostomy). This is often used when there is an infection in the fluid. This is a simple procedure that can often be done at bedside or in a clinic. The procedure may be painful. The risks are the same as using a needle to drain the fluid. The chest tube usually remains for a few days and is connected to suction to improve fluid drainage. After placement, the tube usually does not cause much discomfort. °· Surgical removal of fibrous debris in and around the pleural space (decortication). This may be done with a flexible telescope (thoracoscope) through a small or large cut (incision). This is helpful for patients who have fibrosis or scar tissue that prevents complete lung expansion. The risks are infection, blood loss, and side effects from general anesthesia. °· Sometimes, a procedure called pleurodesis is done. A chest tube is placed and the fluid is drained. Next, an agent (tetracycline, talc powder) is added to the pleural space. This causes the lung and chest wall to stick together (adhesion). This leaves no   potential space for fluid to build up. The risks include infection, blood loss, and side effects from general anesthesia. °· If the effusion is caused by infection, it may be treated with antibiotics and may improve without draining. °HOME CARE  INSTRUCTIONS  °· Take any medicines exactly as prescribed. °· Follow up with your health care provider as directed. °· Monitor your exercise capacity (the amount of walking you can do before you get short of breath). °· Do not use any tobacco products including cigarettes, chewing tobacco, or electronic cigarettes. °SEEK MEDICAL CARE IF:  °· Your exercise capacity seems to get worse or does not improve with time. °· You do not recover from your illness. °· You have drainage, redness, swelling, or pain at any incision or puncture sites. °SEEK IMMEDIATE MEDICAL CARE IF:  °· Shortness of breath or chest pain develops or gets worse. °· You have a fever. °· You develop a new cough, especially if the mucus (phlegm) is discolored. °MAKE SURE YOU:  °· Understand these instructions. °· Will watch your condition. °· Will get help right away if you are not doing well or get worse. °Document Released: 05/31/2005 Document Revised: 10/15/2013 Document Reviewed: 01/20/2007 °[employer]® Patient Information ©2015 [employer]. This information is not intended to replace advice given to you by your health care provider. Make sure you discuss any questions you have with your health care provider. ° °

## 2014-09-18 NOTE — Discharge Summary (Signed)
[facility]  Physician Discharge Summary   Patient ID: Jennifer Nielsen MRN: [id number] DOB/AGE: [birth year] 81 y.o.  Admit date: 09/14/2014 Discharge date: 09/18/2014  PCP: [name], MD  DISCHARGE DIAGNOSES:  Principal Problem:   COPD exacerbation Active Problems:   Long term current use of anticoagulant therapy   Atrial fibrillation   Acute respiratory failure   Chronic diastolic congestive heart failure   Pleural effusion, bilateral   Hyperlipemia   RECOMMENDATIONS FOR OUTPATIENT FOLLOW UP: 1. Continue oxygen around the clock for the next few days 2. Nebulizer treatment on a scheduled basis for the next few days and then as needed  DISCHARGE CONDITION: fair  Diet recommendation: Heart healthy  Filed Weights   09/16/14 0400 09/17/14 0500 09/17/14 1020  Weight: 86.3 kg (190 lb 4.1 oz) 84.2 kg (185 lb 10 oz) 83.915 kg (185 lb)    INITIAL HISTORY: 81 year old Caucasian female with a past medical history of COPD, chronic atrial fibrillation on anticoagulation, diastolic congestive heart failure, presented with shortness of breath. She was admitted to the hospital for further management of COPD exacerbation.  Consultations:  None  Procedures:  None  HOSPITAL COURSE:   Acute on chronic hypoxemic respiratory failure -Likely secondary to chronic obstructive pulmonary disease exacerbation with superimposed large left sided pleural effusion. Chest x-ray showed large left pleural effusion and small right pleural effusion -Patient underwent ultrasound-guided thoracentesis on 09/15/2014 with removal of 1 L of clear yellow fluid. Patient tolerated procedure well. -She seems improved from a respiratory standpoint, and has not required bipap for the last 48 hours.  - She was transferred to the floor. She continues to show improvement. Remains on oxygen by nasal cannula.  Chronic obstructive palmar disease exacerbation -Patient presented with diminished breath sounds along  with bilateral wheezing, clinically improved. -She was transitioned from IV steroid to Prednisone taper.  -She was changed over from IV to oral antibiotics. Steroids will be tapered.   Left-sided pleural effusion. -This could have contributed to her hypoxemia. A chest x-ray performed on admission showed worsening large left-sided pleural effusion compared to prior study on 08/16/2014. -On 09/15/2014 she underwent ultrasound-guided thoracentesis with removal of 1 L of fluid, pleural fluid analysis revealing pleural fluid protein to serum total protein ratio of 0.578, pleural fluid LDH to serum LDH of 0.886, pleural fluid glucose 168. She meets Lights criteria for exudative of effusion. Cytology reveals a reactive mesothelial cells. From a clinical standpoint she seems to be improving.  -Pleuritic fluid cultures showing no growth to date.  -Repeat CXR did not show infiltrate.   Mental status changes/acute encephalopathy -On 09/15/2014 patient having an episode of minimal responsiveness associate with pinpoint pupils.  -She improved over the course of the evening. -CT scan of brain was negative. ABG showed a PCO2 of 52.6 -MRI of brain did not show acute infarct.  -Etiology remains unclear. However, she is back to baseline.  Chronic diastolic congestive heart failure -Last transthoracic echocardiogram performed on 08/16/2014 which showed a preserved ejection fraction of 50-55%. -Continue with Lasix as before.   Chronic atrial fibrillation -She remains rate controlled, Cardizem 120 mg by mouth daily was restarted. Continue digoxin. Her digoxin level was therapeutic. -Continue anticoagulation with Xarelto 20 mg daily  Overall improved. Discussed with her daughter. Discussed with patient. She feels back to baseline. Okay for discharge to skilled nursing facility. Steroid taper to continue. Oral antibiotics to continue. Nebulizer treatments on a scheduled basis for the next few days. Oxygen  around-the-clock for the next few  days.  PERTINENT LABS:  The results of significant diagnostics from this hospitalization (including imaging, microbiology, ancillary and laboratory) are listed below for reference.    Microbiology: Recent Results (from the past 240 hour(s))  MRSA PCR Screening     Status: None   Collection Time: 09/15/14  2:25 AM  Result Value Ref Range Status   MRSA by PCR NEGATIVE NEGATIVE Final    Comment:        The GeneXpert MRSA Assay (FDA approved for NASAL specimens only), is one component of a comprehensive MRSA colonization surveillance program. It is not intended to diagnose MRSA infection nor to guide or monitor treatment for MRSA infections.   Body fluid culture     Status: None   Collection Time: 09/15/14 12:09 PM  Result Value Ref Range Status   Specimen Description PLEURAL LEFT  Final   Special Requests NONE  Final   Gram Stain   Final    FEW WBC PRESENT,BOTH PMN AND MONONUCLEAR NO SQUAMOUS EPITHELIAL CELLS SEEN NO ORGANISMS SEEN Performed at [facility]    Culture   Final    NO GROWTH 3 DAYS Performed at [facility]    Report Status 09/18/2014 FINAL  Final     Labs: Basic Metabolic Panel:  Recent Labs Lab 09/14/14 2203 09/15/14 0400 09/16/14 0400 09/17/14 0340 09/18/14 0405  NA 134* 135 139 138 141  K 4.4 5.0 3.8 3.2* 4.1  CL 91* 94* 94* 88* 93*  CO2 23 30 36* 40* 40*  GLUCOSE 199* 217* 145* 133* 116*  BUN 26* 29* 31* 34* 27*  CREATININE 0.64 0.64 0.51 0.56 0.56  CALCIUM 9.2 8.9 8.5 8.2* 8.1*   Liver Function Tests:  Recent Labs Lab 09/15/14 0907  AST 28  ALT 28  ALKPHOS 82  BILITOT 0.3  PROT 6.4  ALBUMIN 2.6*   CBC:  Recent Labs Lab 09/14/14 2203 09/15/14 0400 09/16/14 0400 09/17/14 0340 09/18/14 0405  WBC 14.2* 11.9* 8.7 8.7 9.5  NEUTROABS 10.3*  --   --   --   --   HGB 12.3 11.8* 10.5* 10.8* 11.1*  HCT 39.4 36.8 33.1* 33.9* 35.5*  MCV 98.0 95.3 95.4 96.0 97.0  PLT 291 250 254  311 285   BNP: BNP (last 3 results)  Recent Labs  08/11/14 1740 08/16/14 0440 09/14/14 2300  BNP 25.7 147.8* 371.9*     IMAGING STUDIES Dg Chest 2 View  09/16/2014   CLINICAL DATA:  Shortness of breath. Recent left-sided thoracentesis.  EXAM: CHEST  2 VIEW  COMPARISON:  09/14/2014  FINDINGS: The heart is enlarged but stable. Smaller left-sided pleural effusion status post thoracentesis. There is a stable small right pleural effusion. No definite pneumothorax. Probable skin folds over the left chest as there are lung markings beyond. Persistent central vascular congestion and bibasilar atelectasis.  IMPRESSION: Decrease in size of the left-sided pleural effusion.  Stable cardiac enlargement.  Small effusions and bibasilar atelectasis.  Stable vascular congestion without overt pulmonary edema.   Electronically Signed   By: Marijo Sanes M.D.   On: 09/16/2014 16:14   Dg Chest 2 View (if Patient Has Fever And/or Copd)  09/14/2014   CLINICAL DATA:  Dyspnea for 1 day.  EXAM: CHEST  2 VIEW  COMPARISON:  08/16/2014  FINDINGS: There is a large left pleural effusion, increased from 08/16/2014. There is a smaller right pleural effusion. There is marked unchanged cardiomegaly. Moderate vascular and interstitial congestion is present.  IMPRESSION: Large left pleural  effusion and small right pleural effusion, increased from 08/16/2014. Moderate vascular and interstitial congestive changes are present.   Electronically Signed   By: Andreas Newport M.D.   On: 09/14/2014 23:18   Ct Head Wo Contrast  09/15/2014   CLINICAL DATA:  Mental status changes with lethargy and limited arousal. Acute encephalopathy.  EXAM: CT HEAD WITHOUT CONTRAST  TECHNIQUE: Contiguous axial images were obtained from the base of the skull through the vertex without intravenous contrast.  COMPARISON:  Head CT 08/11/2014.  FINDINGS: Some images were repeated due to motion.  There is no evidence of acute intracranial hemorrhage, mass lesion,  brain edema or extra-axial fluid collection. The ventricles and subarachnoid spaces are mildly prominent but stable. There is no CT evidence of acute cortical infarction. Mild periventricular white matter disease appears unchanged. Intracranial vascular calcifications noted.  The visualized paranasal sinuses, mastoid air cells and middle ears are clear. The calvarium is intact.  IMPRESSION: Stable examination demonstrating no acute findings. Stable atrophy and mild periventricular white matter disease.   Electronically Signed   By: Richardean Sale M.D.   On: 09/15/2014 16:27   Mr Brain Wo Contrast  09/16/2014   CLINICAL DATA:  Episode of minimal responsiveness, becoming of nonverbal and pinpoint pupils. When she recovered, there was difficulty speaking. Initial encounter.  EXAM: MRI HEAD WITHOUT CONTRAST  TECHNIQUE: Multiplanar, multiecho pulse sequences of the brain and surrounding structures were obtained without intravenous contrast.  COMPARISON:  CT head 09/15/2014.  FINDINGS: No evidence for acute infarction, hemorrhage, mass lesion, or extra-axial fluid. Generalized atrophy. Hydrocephalus ex vacuo. Moderately advanced small vessel disease affecting not only supratentorial compartment but the brainstem. Pituitary, pineal, and cerebellar tonsils unremarkable. No upper cervical lesions. Flow voids are maintained throughout the carotid, basilar, and vertebral arteries. There are no areas of chronic hemorrhage. Visualized calvarium, skull base, and upper cervical osseous structures unremarkable. Scalp and extracranial soft tissues, orbits, sinuses, and mastoids show no acute process.  IMPRESSION: Chronic changes as described.  No acute intracranial abnormality.   Electronically Signed   By: Rolla Flatten M.D.   On: 09/16/2014 14:56   Dg Chest Port 1 View  09/15/2014   CLINICAL DATA:  Status post left thoracentesis  EXAM: PORTABLE CHEST - 1 VIEW  COMPARISON:  09/14/2014  FINDINGS: Significant improvement in the  left effusion following thoracentesis. No pneumothorax. Small effusions remain with bibasilar collapse/ consolidation. Heart is enlarged. Atherosclerosis of the aorta. Degenerative changes of the spine and shoulders.  IMPRESSION: Improvement in the left effusion following thoracentesis. No pneumothorax.   Electronically Signed   By: Jerilynn Mages.  Shick M.D.   On: 09/15/2014 12:46   US Thoracentesis Asp Pleural Space W/img Guide  09/15/2014   INDICATION: Symptomatic bilateral effusions left greater than right.  EXAM: US THORACENTESIS ASP PLEURAL SPACE W/IMG GUIDE  COMPARISON:  CXR 09/14/14.  MEDICATIONS: None  COMPLICATIONS: None immediate  TECHNIQUE: Informed written consent was obtained from the patient's family after a discussion of the risks, benefits and alternatives to treatment. A timeout was performed prior to the initiation of the procedure.  Initial ultrasound scanning demonstrates a left pleural effusion. The lower chest was prepped and draped in the usual sterile fashion. 1% lidocaine was used for local anesthesia.  Under direct ultrasound guidance, a 19 gauge, 7-cm, Yueh catheter was introduced. An ultrasound image was saved for documentation purposes. The thoracentesis was performed. The catheter was removed and a dressing was applied. The patient tolerated the procedure well without immediate post procedural  complication. The patient was escorted to have an upright chest radiograph.  FINDINGS: A total of approximately 1 liter of serous fluid was removed. Requested samples were sent to the laboratory.  IMPRESSION: Successful ultrasound-guided left sided thoracentesis yielding 1 liter of pleural fluid.  Read By:  Tsosie Billing PA-C   Electronically Signed   By: Jerilynn Mages.  Shick M.D.   On: 09/15/2014 12:12    DISCHARGE EXAMINATION: Filed Vitals:   09/17/14 1320 09/17/14 2151 09/17/14 2230 09/18/14 0621  BP: 127/56 129/61  149/64  Pulse: 88 73 68   Temp: 98.3 F (36.8 C) 98.3 F (36.8 C)  97.8 F (36.6 C)    TempSrc: Oral Oral  Oral  Resp: 18 18  18   Height:      Weight:      SpO2: 95% 98%  100%   General appearance: alert, cooperative, appears stated age and no distress Resp: Decreased air entry at the bases without any crackles. Few scattered wheezes. Cardio: regular rate and rhythm, S1, S2 normal, no murmur, click, rub or gallop GI: soft, non-tender; bowel sounds normal; no masses,  no organomegaly  DISPOSITION: SNF. [place].  Discharge Instructions    Call MD for:  difficulty breathing, headache or visual disturbances    Complete by:  As directed      Call MD for:  extreme fatigue    Complete by:  As directed      Call MD for:  persistant dizziness or light-headedness    Complete by:  As directed      Call MD for:  persistant nausea and vomiting    Complete by:  As directed      Call MD for:  severe uncontrolled pain    Complete by:  As directed      Diet - low sodium heart healthy    Complete by:  As directed      Discharge instructions    Complete by:  As directed   Oxygen by nasal canula at 2lpm around the clock for now. May use as needed after few days.     Increase activity slowly    Complete by:  As directed            ALLERGIES:  Allergies  Allergen Reactions  . Chocolate Itching  . Ciprofloxacin Itching  . Coffee Bean Extract [Coffea Arabica] Itching  . Coffee Flavor Itching  . Eggs Or Egg-Derived Products   . Gentamycin [Gentamicin] Itching  . Imodium [Loperamide] Other (See Comments)    unknown  . Penicillins Itching  . Sulfa Antibiotics Itching  . Tea Itching     Current Discharge Medication List    START taking these medications   Details  doxycycline (VIBRA-TABS) 100 MG tablet Take 1 tablet (100 mg total) by mouth every 12 (twelve) hours. For 5 more days. Qty: 10 tablet, Refills: 0    guaiFENesin (MUCINEX) 600 MG 12 hr tablet Take 1 tablet (600 mg total) by mouth 2 (two) times daily.    ipratropium-albuterol (DUONEB) 0.5-2.5 (3) MG/3ML  SOLN 60ml nebulized 4 times daily scheduled and every 4hrs as needed for wheezing for 3 days. And then every 4hours as needed for wheezing. Qty: 360 mL    predniSONE (DELTASONE) 20 MG tablet Take 3 tablets once daily for 4 days, then take 2 tablets once daily for 4 days, then take 1 tablet once daily for 4 days, then STOP. Qty: 24 tablet, Refills: 0      CONTINUE these medications which  have NOT CHANGED   Details  acetaminophen (TYLENOL) 500 MG tablet Take 500 mg by mouth 3 (three) times daily.     budesonide-formoterol (SYMBICORT) 160-4.5 MCG/ACT inhaler Inhale 2 puffs into the lungs 2 (two) times daily.    Calcium Carbonate-Vitamin D (CALTRATE 600+D PO) Take 1 tablet by mouth every morning. For calcium supplement    !! digoxin (LANOXIN) 0.125 MG tablet Take 0.125 mg by mouth every other day.    !! digoxin (LANOXIN) 0.25 MG tablet Take 0.25 mg by mouth every other day. Alternate between  0.125 and 0.250    diltiazem (CARDIZEM CD) 120 MG 24 hr capsule Take 120 mg by mouth daily. For A-Fib    Docusate Sodium (DSS) 100 MG CAPS Take 100 mg by mouth daily. For constipation    furosemide (LASIX) 40 MG tablet Take 40 mg by mouth 2 (two) times daily.    Multiple Vitamins-Minerals (DECUBI-VITE) CAPS Take 1 capsule by mouth daily.    Nutritional Supplements (NUTRITIONAL DRINK PO) Take 120 mLs by mouth 2 (two) times daily. MIGHTY SHAKES    Polyethyl Glycol-Propyl Glycol (SYSTANE OP) Place 1 drop into both eyes 2 (two) times daily. For dry eyes    potassium chloride SA (K-DUR,KLOR-CON) 20 MEQ tablet Take 20 mEq by mouth 2 (two) times daily.    Protein (PROCEL) POWD Take 2 scoop by mouth 2 (two) times daily.    Rivaroxaban (XARELTO) 20 MG TABS tablet Take 20 mg by mouth daily. For DVT     !! - Potential duplicate medications found. Please discuss with provider.     Follow-up Information    Follow up with DAY,JAMES, MD. Schedule an appointment as soon as possible for a visit in 1 week.    Specialty:  Internal Medicine   Why:  post hospitalization follow up      TOTAL DISCHARGE TIME: 35 minutes  [facility] Pager [phone]  09/18/2014, 10:54 AM

## 2014-09-18 NOTE — Progress Notes (Signed)
Pt for discharge to [facility].  CSW facilitated pt discharge needs including contacting facility, faxing pt discharge information via [facility], discussing with pt and pt daughter at bedside, providing RN phone number to call report, and arranging ambulance transport via [facility] for pt back to [facility].   Pt daughter expressed that she feels that pt is feeling a lot better and feels that pt even looks better than when she discharged previous admission and hopeful that pt can continue to progress.   No further social work needs identified at this time.  CSW signing off.   Alison Murray, MSW, [facility] [phone]

## 2014-09-19 ENCOUNTER — Encounter: Payer: Self-pay | Admitting: Adult Health

## 2014-09-19 ENCOUNTER — Non-Acute Institutional Stay (SKILLED_NURSING_FACILITY): Payer: Medicare Other | Admitting: Adult Health

## 2014-09-19 DIAGNOSIS — I482 Chronic atrial fibrillation, unspecified: Secondary | ICD-10-CM

## 2014-09-19 DIAGNOSIS — R5381 Other malaise: Secondary | ICD-10-CM

## 2014-09-19 DIAGNOSIS — J441 Chronic obstructive pulmonary disease with (acute) exacerbation: Secondary | ICD-10-CM

## 2014-09-19 DIAGNOSIS — I5032 Chronic diastolic (congestive) heart failure: Secondary | ICD-10-CM | POA: Diagnosis not present

## 2014-09-19 DIAGNOSIS — J90 Pleural effusion, not elsewhere classified: Secondary | ICD-10-CM

## 2014-09-19 DIAGNOSIS — E876 Hypokalemia: Secondary | ICD-10-CM | POA: Diagnosis not present

## 2014-09-19 DIAGNOSIS — E43 Unspecified severe protein-calorie malnutrition: Secondary | ICD-10-CM | POA: Diagnosis not present

## 2014-09-19 DIAGNOSIS — J9610 Chronic respiratory failure, unspecified whether with hypoxia or hypercapnia: Secondary | ICD-10-CM

## 2014-09-19 NOTE — Progress Notes (Signed)
Patient ID: Jennifer Nielsen, female   DOB: 12/03/1924, 81 y.o.   MRN: [id number]   09/19/2014  Facility:  Nursing Home Location:  [place] Room Number: 5732-2 LEVEL OF CARE:  SNF (31)  Chief Complaint  Patient presents with  . Hospitalization Follow-up    Physical deconditioning, acute on chronic hypoxemic respiratory failure, Pleural effusion, COPD, CHF, atrial fibrillation, hypokalemia and protein ccalorie malnutrition    HISTORY OF PRESENT ILLNESS:  This is an 81 year old female who has been admitted to [facility] on 09/18/14 from [facility]. She has PMH of COPD, chronic atrial fibrillation on anticoagulation and diastolic CHF. She is a long-term resident here at [facility] and was transferred to the hospital due to SOB. Chest x-ray shows large left pleural effusion and small right pleural effusion. She had ultrasound-guided thoracentesis on 09/15/14 with removal of 1 L clear yellow fluid. She had bilateral wheezing with diminished breath sounds. She was given IV steroids and transitioned to oral prednisone taper. She was also given IV antibiotics and transitioned to oral antibiotic.  She has been readmitted for short rehabilitation then asked long-term resident.  PAST MEDICAL HISTORY:  Past Medical History  Diagnosis Date  . Arrhythmia   . Arthritis   . Atrial fibrillation     Remains stable (As of 05/09/13)  . H/O blood clots   . Hyperlipemia   . GERD (gastroesophageal reflux disease)     Stable (As of 05/09/13)  . Osteoarthritis     Denies pain (As of 05/09/13)  . CHF (congestive heart failure)     chronic diastolic  . COPD (chronic obstructive pulmonary disease)     w/exacerbation  . Dementia     Remains stable & continues to function adequately in the current living environment with supervision. Has had little changes in behavior (AS OF 05/09/13)    CURRENT MEDICATIONS: Reviewed per MAR/see medication list  Allergies  Allergen  Reactions  . Chocolate Itching  . Ciprofloxacin Itching  . Coffee Bean Extract [Coffea Arabica] Itching  . Coffee Flavor Itching  . Eggs Or Egg-Derived Products   . Gentamycin [Gentamicin] Itching  . Imodium [Loperamide] Other (See Comments)    unknown  . Penicillins Itching  . Sulfa Antibiotics Itching  . Tea Itching     REVIEW OF SYSTEMS:  GENERAL: no change in appetite, no fatigue, no weight changes, no fever, chills or weakness RESPIRATORY: no cough, SOB, DOE, wheezing, hemoptysis CARDIAC: no chest pain, edema or palpitations GI: no abdominal pain, diarrhea, constipation, heart burn, nausea or vomiting  PHYSICAL EXAMINATION  GENERAL: no acute distress, normal body habitus EYES: conjunctivae normal, sclerae normal, normal eye lids NECK: supple, trachea midline, no neck masses, no thyroid tenderness, no thyromegaly LYMPHATICS: no LAN in the neck, no supraclavicular LAN RESPIRATORY: breathing is even & unlabored, BS CTAB CARDIAC: Irregularly irregular heart rate, no murmur,no extra heart sounds, no edema GI: abdomen soft, normal BS, no masses, no tenderness, no hepatomegaly, no splenomegaly EXTREMITIES: Able to move 4 extremities; generalized weakness on bilateral lower extremities PSYCHIATRIC: the patient is alert & oriented to person, affect & behavior appropriate  LABS/RADIOLOGY: Labs reviewed: Basic Metabolic Panel:  Recent Labs  08/17/14 0420 08/18/14 0415  09/16/14 0400 09/17/14 0340 09/18/14 0405  NA 137 137  < > 139 138 141  K 3.5 3.2*  < > 3.8 3.2* 4.1  CL 84* 79*  < > 94* 88* 93*  CO2 43* 46*  < > 36*  40* 40*  GLUCOSE 120* 142*  < > 145* 133* 116*  BUN 28* 31*  < > 31* 34* 27*  CREATININE 0.49* 0.58  < > 0.51 0.56 0.56  CALCIUM 7.9* 8.2*  < > 8.5 8.2* 8.1*  MG 2.1 2.1  --   --   --   --   < > = values in this interval not displayed. Liver Function Tests:  Recent Labs  08/11/14 1740 08/12/14 0356 09/15/14 0907  AST 23 22 28   ALT 15 12 28     ALKPHOS 89 84 82  BILITOT 0.8 0.8 0.3  PROT 7.0 6.8 6.4  ALBUMIN 2.8* 2.6* 2.6*    Recent Labs  08/11/14 2251 08/19/14 0510  AMMONIA 22 27   CBC:  Recent Labs  08/11/14 1740 08/12/14 0356  09/14/14 2203  09/16/14 0400 09/17/14 0340 09/18/14 0405  WBC 19.7* 12.9*  < > 14.2*  < > 8.7 8.7 9.5  NEUTROABS 16.2* 11.6*  --  10.3*  --   --   --   --   HGB 14.3 13.4  < > 12.3  < > 10.5* 10.8* 11.1*  HCT 45.9 44.5  < > 39.4  < > 33.1* 33.9* 35.5*  MCV 99.6 100.7*  < > 98.0  < > 95.4 96.0 97.0  PLT 413* 362  < > 291  < > 254 311 285  < > = values in this interval not displayed.  CBG:  Recent Labs  09/20/13 0822  GLUCAP 138*    Dg Chest 2 View  09/16/2014   CLINICAL DATA:  Shortness of breath. Recent left-sided thoracentesis.  EXAM: CHEST  2 VIEW  COMPARISON:  09/14/2014  FINDINGS: The heart is enlarged but stable. Smaller left-sided pleural effusion status post thoracentesis. There is a stable small right pleural effusion. No definite pneumothorax. Probable skin folds over the left chest as there are lung markings beyond. Persistent central vascular congestion and bibasilar atelectasis.  IMPRESSION: Decrease in size of the left-sided pleural effusion.  Stable cardiac enlargement.  Small effusions and bibasilar atelectasis.  Stable vascular congestion without overt pulmonary edema.   Electronically Signed   By: Marijo Sanes M.D.   On: 09/16/2014 16:14   Dg Chest 2 View (if Patient Has Fever And/or Copd)  09/14/2014   CLINICAL DATA:  Dyspnea for 1 day.  EXAM: CHEST  2 VIEW  COMPARISON:  08/16/2014  FINDINGS: There is a large left pleural effusion, increased from 08/16/2014. There is a smaller right pleural effusion. There is marked unchanged cardiomegaly. Moderate vascular and interstitial congestion is present.  IMPRESSION: Large left pleural effusion and small right pleural effusion, increased from 08/16/2014. Moderate vascular and interstitial congestive changes are present.    Electronically Signed   By: Andreas Newport M.D.   On: 09/14/2014 23:18   Ct Head Wo Contrast  09/15/2014   CLINICAL DATA:  Mental status changes with lethargy and limited arousal. Acute encephalopathy.  EXAM: CT HEAD WITHOUT CONTRAST  TECHNIQUE: Contiguous axial images were obtained from the base of the skull through the vertex without intravenous contrast.  COMPARISON:  Head CT 08/11/2014.  FINDINGS: Some images were repeated due to motion.  There is no evidence of acute intracranial hemorrhage, mass lesion, brain edema or extra-axial fluid collection. The ventricles and subarachnoid spaces are mildly prominent but stable. There is no CT evidence of acute cortical infarction. Mild periventricular white matter disease appears unchanged. Intracranial vascular calcifications noted.  The visualized paranasal sinuses, mastoid air cells  and middle ears are clear. The calvarium is intact.  IMPRESSION: Stable examination demonstrating no acute findings. Stable atrophy and mild periventricular white matter disease.   Electronically Signed   By: Richardean Sale M.D.   On: 09/15/2014 16:27   Mr Brain Wo Contrast  09/16/2014   CLINICAL DATA:  Episode of minimal responsiveness, becoming of nonverbal and pinpoint pupils. When she recovered, there was difficulty speaking. Initial encounter.  EXAM: MRI HEAD WITHOUT CONTRAST  TECHNIQUE: Multiplanar, multiecho pulse sequences of the brain and surrounding structures were obtained without intravenous contrast.  COMPARISON:  CT head 09/15/2014.  FINDINGS: No evidence for acute infarction, hemorrhage, mass lesion, or extra-axial fluid. Generalized atrophy. Hydrocephalus ex vacuo. Moderately advanced small vessel disease affecting not only supratentorial compartment but the brainstem. Pituitary, pineal, and cerebellar tonsils unremarkable. No upper cervical lesions. Flow voids are maintained throughout the carotid, basilar, and vertebral arteries. There are no areas of chronic  hemorrhage. Visualized calvarium, skull base, and upper cervical osseous structures unremarkable. Scalp and extracranial soft tissues, orbits, sinuses, and mastoids show no acute process.  IMPRESSION: Chronic changes as described.  No acute intracranial abnormality.   Electronically Signed   By: Rolla Flatten M.D.   On: 09/16/2014 14:56   Dg Chest Port 1 View  09/15/2014   CLINICAL DATA:  Status post left thoracentesis  EXAM: PORTABLE CHEST - 1 VIEW  COMPARISON:  09/14/2014  FINDINGS: Significant improvement in the left effusion following thoracentesis. No pneumothorax. Small effusions remain with bibasilar collapse/ consolidation. Heart is enlarged. Atherosclerosis of the aorta. Degenerative changes of the spine and shoulders.  IMPRESSION: Improvement in the left effusion following thoracentesis. No pneumothorax.   Electronically Signed   By: Jerilynn Mages.  Shick M.D.   On: 09/15/2014 12:46   US Thoracentesis Asp Pleural Space W/img Guide  09/15/2014   INDICATION: Symptomatic bilateral effusions left greater than right.  EXAM: US THORACENTESIS ASP PLEURAL SPACE W/IMG GUIDE  COMPARISON:  CXR 09/14/14.  MEDICATIONS: None  COMPLICATIONS: None immediate  TECHNIQUE: Informed written consent was obtained from the patient's family after a discussion of the risks, benefits and alternatives to treatment. A timeout was performed prior to the initiation of the procedure.  Initial ultrasound scanning demonstrates a left pleural effusion. The lower chest was prepped and draped in the usual sterile fashion. 1% lidocaine was used for local anesthesia.  Under direct ultrasound guidance, a 19 gauge, 7-cm, Yueh catheter was introduced. An ultrasound image was saved for documentation purposes. The thoracentesis was performed. The catheter was removed and a dressing was applied. The patient tolerated the procedure well without immediate post procedural complication. The patient was escorted to have an upright chest radiograph.  FINDINGS: A  total of approximately 1 liter of serous fluid was removed. Requested samples were sent to the laboratory.  IMPRESSION: Successful ultrasound-guided left sided thoracentesis yielding 1 liter of pleural fluid.  Read By:  Tsosie Billing PA-C   Electronically Signed   By: Jerilynn Mages.  Shick M.D.   On: 09/15/2014 12:12    ASSESSMENT/PLAN:  Physical deconditioning - for rehabilitation Acute on chronic hypoxemic respiratory failure - stable; continue O2 at 2 L/minute Pleural effusion - S/P ultrasound-guided biopsy; cytology reveals a reactive mesothelial cells COPD exacerbation - continue doxycycline 100 mg by mouth every 12 hours, prednisone taper, Mucinex 600 mg by mouth every 12 hours, DuoNeb 2.5 mg/3 mL 1 4 times a day and Symbicort was 60-4 0.5 g/HCT 2 puffs twice a day Chronic diastolic CHF -EF on 08/13/58 is  50-55%; continue digoxin 0.25 mg by mouth every other day and 0.125 mg by mouth every other day and Lasix 40 mg by mouth twice a day Chronic atrial fibrillation - rate controlled; continue Cardizem 120 mg by mouth daily and Xarelto 20 mg by mouth daily Hypokalemia - K4.1; continue K Dur 20 meq by mouth twice a day Protein calorie malnutrition, severe - albumin 2.6; RD consult; continue supplementation   Goals of care:  Short-term rehabilitation then long-term care   Labs/test ordered:  CMP in 1 week  Spent 50 minutes in patient care.    [facility], NP [employer] [phone]

## 2014-09-20 ENCOUNTER — Non-Acute Institutional Stay (SKILLED_NURSING_FACILITY): Payer: Medicare Other | Admitting: Internal Medicine

## 2014-09-20 DIAGNOSIS — J9622 Acute and chronic respiratory failure with hypercapnia: Secondary | ICD-10-CM

## 2014-09-20 DIAGNOSIS — R5381 Other malaise: Secondary | ICD-10-CM | POA: Diagnosis not present

## 2014-09-20 DIAGNOSIS — J441 Chronic obstructive pulmonary disease with (acute) exacerbation: Secondary | ICD-10-CM | POA: Diagnosis not present

## 2014-09-20 DIAGNOSIS — I5032 Chronic diastolic (congestive) heart failure: Secondary | ICD-10-CM | POA: Diagnosis not present

## 2014-09-20 DIAGNOSIS — J90 Pleural effusion, not elsewhere classified: Secondary | ICD-10-CM

## 2014-09-20 DIAGNOSIS — E876 Hypokalemia: Secondary | ICD-10-CM | POA: Diagnosis not present

## 2014-09-20 DIAGNOSIS — E46 Unspecified protein-calorie malnutrition: Secondary | ICD-10-CM | POA: Diagnosis not present

## 2014-09-20 DIAGNOSIS — I482 Chronic atrial fibrillation, unspecified: Secondary | ICD-10-CM

## 2014-09-20 NOTE — Progress Notes (Signed)
Patient ID: Jennifer Nielsen, female   DOB: July 03, 1924, 81 y.o.   MRN: [id number]     [facility]   PCP: DAY,JAMES, MD  Code Status: DNR  Allergies  Allergen Reactions  . Chocolate Itching  . Ciprofloxacin Itching  . Coffee Bean Extract [Coffea Arabica] Itching  . Coffee Flavor Itching  . Eggs Or Egg-Derived Products   . Gentamycin [Gentamicin] Itching  . Imodium [Loperamide] Other (See Comments)    unknown  . Penicillins Itching  . Sulfa Antibiotics Itching  . Tea Itching    Chief Complaint  Patient presents with  . New Admit To SNF     HPI:  81 year old patient is here for long term care post hospital admission from 09/14/14-09/18/14 with worsening dyspnea. She was noted to have large left sided pleural effusion, underwent thoracocentesis and 1 l fluid was removed which was exudative in nature. She was also treated for acute copd exacerbation with iv steroid and iv antibiotic which were later switched to oral and she was discharged back to SNF for continuation of care.  She has PMH of COPD, chronic atrial fibrillation on anticoagulation and diastolic CHF.  She is seen in her room today. She is on o2, chronically ill appearing but in no distress. She mentions having a good morning so far, feeding herself. Denies any chest pain, breathing is comfortable with the oxygen.   Review of Systems:  Constitutional: positive for fatigue. Negative for fever, chills, diaphoresis.  HENT: Negative for headache, congestion Eyes: Negative for eye pain, blurred vision, double vision and discharge.  Respiratory: Negative for cough, shortness of breath and wheezing.   Cardiovascular: Negative for chest pain, palpitations, leg swelling.  Gastrointestinal: Negative for heartburn, nausea, vomiting, abdominal pain Genitourinary: Negative for dysuria Musculoskeletal: Negative for falls Skin: Negative for itching, rash.  Neurological: positive for weakness. Negative for  dizziness, tingling, focal weakness Psychiatric/Behavioral: Negative for depression   Past Medical History  Diagnosis Date  . Arrhythmia   . Arthritis   . Atrial fibrillation     Remains stable (As of 05/09/13)  . H/O blood clots   . Hyperlipemia   . GERD (gastroesophageal reflux disease)     Stable (As of 05/09/13)  . Osteoarthritis     Denies pain (As of 05/09/13)  . CHF (congestive heart failure)     chronic diastolic  . COPD (chronic obstructive pulmonary disease)     w/exacerbation  . Dementia     Remains stable & continues to function adequately in the current living environment with supervision. Has had little changes in behavior (AS OF 05/09/13)   Past Surgical History  Procedure Laterality Date  . Appendectomy    . Gallbladder surgery    . Hip surgery      Right-Metal plate   . Leg surgery      Left leg   Social History:   reports that she has quit smoking. She has never used smokeless tobacco. She reports that she does not drink alcohol or use illicit drugs.  Family History  Problem Relation Age of Onset  . Colon cancer Neg Hx   . Heart disease Father   . Breast cancer Daughter     Medications: Patient's Medications  New Prescriptions   No medications on file  Previous Medications   ACETAMINOPHEN (TYLENOL) 500 MG TABLET    Take 500 mg by mouth 3 (three) times daily.    BUDESONIDE-FORMOTEROL (SYMBICORT) 160-4.5 MCG/ACT INHALER  Inhale 2 puffs into the lungs 2 (two) times daily.   CALCIUM CARBONATE-VITAMIN D (CALTRATE 600+D PO)    Take 1 tablet by mouth every morning. For calcium supplement   DIGOXIN (LANOXIN) 0.125 MG TABLET    Take 0.125 mg by mouth every other day.   DIGOXIN (LANOXIN) 0.25 MG TABLET    Take 0.25 mg by mouth every other day. Alternate between  0.125 and 0.250   DILTIAZEM (CARDIZEM CD) 120 MG 24 HR CAPSULE    Take 120 mg by mouth daily. For A-Fib   DOCUSATE SODIUM (DSS) 100 MG CAPS    Take 100 mg by mouth daily. For constipation    DOXYCYCLINE (VIBRA-TABS) 100 MG TABLET    Take 1 tablet (100 mg total) by mouth every 12 (twelve) hours. For 5 more days.   FUROSEMIDE (LASIX) 40 MG TABLET    Take 40 mg by mouth 2 (two) times daily.   GUAIFENESIN (MUCINEX) 600 MG 12 HR TABLET    Take 1 tablet (600 mg total) by mouth 2 (two) times daily.   IPRATROPIUM-ALBUTEROL (DUONEB) 0.5-2.5 (3) MG/3ML SOLN    97ml nebulized 4 times daily scheduled and every 4hrs as needed for wheezing for 3 days. And then every 4hours as needed for wheezing.   MULTIPLE VITAMINS-MINERALS (DECUBI-VITE) CAPS    Take 1 capsule by mouth daily.   NUTRITIONAL SUPPLEMENTS (NUTRITIONAL DRINK PO)    Take 120 mLs by mouth 2 (two) times daily. MIGHTY SHAKES   POLYETHYL GLYCOL-PROPYL GLYCOL (SYSTANE OP)    Place 1 drop into both eyes 2 (two) times daily. For dry eyes   POTASSIUM CHLORIDE SA (K-DUR,KLOR-CON) 20 MEQ TABLET    Take 20 mEq by mouth 2 (two) times daily.   PREDNISONE (DELTASONE) 20 MG TABLET    Take 3 tablets once daily for 4 days, then take 2 tablets once daily for 4 days, then take 1 tablet once daily for 4 days, then STOP.   PROTEIN (PROCEL) POWD    Take 2 scoop by mouth 2 (two) times daily.   RIVAROXABAN (XARELTO) 20 MG TABS TABLET    Take 20 mg by mouth daily. For DVT  Modified Medications   No medications on file  Discontinued Medications   No medications on file     Physical Exam: Filed Vitals:   09/20/14 1217  BP: 124/64  Pulse: 82  Temp: 97.6 F (36.4 C)  Resp: 19  Weight: 188 lb (85.276 kg)  SpO2: 96%   General- elderly female, obese, chronically ill appearing, in no acute distress Head- normocephalic, atraumatic Throat- moist mucus membrane Eyes- no pallor, no icterus, no discharge, normal conjunctiva, normal sclera Neck- no cervical lymphadenopathy Cardiovascular- normal s1,s2, no murmurs, no leg edema Respiratory- bilateral poor inspiratory effort, on o2, no wheeze, no rhonchi, no crackles, no use of accessory muscles Abdomen- bowel  sounds present, soft, non tender Musculoskeletal- generalized weakness more in lower extremities Neurological- alert and oriented Skin- warm and dry, brusies in arms and hands Psychiatry- calm with  Normal affect   Labs reviewed: Basic Metabolic Panel:  Recent Labs  08/17/14 0420 08/18/14 0415  09/16/14 0400 09/17/14 0340 09/18/14 0405  NA 137 137  < > 139 138 141  K 3.5 3.2*  < > 3.8 3.2* 4.1  CL 84* 79*  < > 94* 88* 93*  CO2 43* 46*  < > 36* 40* 40*  GLUCOSE 120* 142*  < > 145* 133* 116*  BUN 28* 31*  < >  31* 34* 27*  CREATININE 0.49* 0.58  < > 0.51 0.56 0.56  CALCIUM 7.9* 8.2*  < > 8.5 8.2* 8.1*  MG 2.1 2.1  --   --   --   --   < > = values in this interval not displayed. Liver Function Tests:  Recent Labs  08/11/14 1740 08/12/14 0356 09/15/14 0907  AST 23 22 28   ALT 15 12 28   ALKPHOS 89 84 82  BILITOT 0.8 0.8 0.3  PROT 7.0 6.8 6.4  ALBUMIN 2.8* 2.6* 2.6*   No results for input(s): LIPASE, AMYLASE in the last 8760 hours.  Recent Labs  08/11/14 2251 08/19/14 0510  AMMONIA 22 27   CBC:  Recent Labs  08/11/14 1740 08/12/14 0356  09/14/14 2203  09/16/14 0400 09/17/14 0340 09/18/14 0405  WBC 19.7* 12.9*  < > 14.2*  < > 8.7 8.7 9.5  NEUTROABS 16.2* 11.6*  --  10.3*  --   --   --   --   HGB 14.3 13.4  < > 12.3  < > 10.5* 10.8* 11.1*  HCT 45.9 44.5  < > 39.4  < > 33.1* 33.9* 35.5*  MCV 99.6 100.7*  < > 98.0  < > 95.4 96.0 97.0  PLT 413* 362  < > 291  < > 254 311 285  < > = values in this interval not displayed.   Assessment/Plan  Physical deconditioning With her chronic co-morbidities and recent hospital admission. She was admitted to hospital in feb as well. Will have her work with physical therapy and occupational therapy team to help with gait training and muscle strengthening exercises.fall precautions. Skin care. Encourage to be out of bed.   Chronic respiratory failure  With history of copd and chf. Had recent acute on chronic failure with  pleural effusion. Breathing stable today, continue o2 by nasal canula  Pleural effusion S/P ultrasound-guided thoracocentesis, clinically improved  COPD exacerbation Continue o2, complete course of doxycycline on 09/22/14 and prednisone taper. Continue duoneb and symbicort  Protein calorie malnutrition Continue mighty shake, procel and decubvite, monitor weight, monitor po intake, f/u with dietary team  Chronic diastolic CHF continue digoxin, Lasix 40 mg bid, monitor bmp  Chronic atrial fibrillation continue Cardizem 120 mg daily and Xarelto 20 mg daily  Hypokalemia continue K Dur 20 meq bid, monitor bmp   Goals of care: long term care   Labs/tests ordered: cmp  Family/ staff Communication: reviewed care plan with patient and nursing supervisor    Blanchie Serve, MD  [place] [phone] (Monday-Friday 8 am - 5 pm) [phone] (afterhours)

## 2014-09-23 DIAGNOSIS — L97429 Non-pressure chronic ulcer of left heel and midfoot with unspecified severity: Secondary | ICD-10-CM | POA: Diagnosis not present

## 2014-09-23 DIAGNOSIS — L602 Onychogryphosis: Secondary | ICD-10-CM | POA: Diagnosis not present

## 2014-10-09 ENCOUNTER — Inpatient Hospital Stay (HOSPITAL_COMMUNITY)
Admission: EM | Admit: 2014-10-09 | Discharge: 2014-10-12 | DRG: 871 | Disposition: A | Discharge status: Skilled Nursing Facility | Payer: Medicare Other | Attending: Internal Medicine | Admitting: Internal Medicine

## 2014-10-09 ENCOUNTER — Emergency Department (HOSPITAL_COMMUNITY): Payer: Medicare Other

## 2014-10-09 ENCOUNTER — Encounter (HOSPITAL_COMMUNITY): Payer: Self-pay

## 2014-10-09 DIAGNOSIS — E669 Obesity, unspecified: Secondary | ICD-10-CM | POA: Diagnosis present

## 2014-10-09 DIAGNOSIS — Z993 Dependence on wheelchair: Secondary | ICD-10-CM | POA: Diagnosis not present

## 2014-10-09 DIAGNOSIS — I509 Heart failure, unspecified: Secondary | ICD-10-CM | POA: Diagnosis not present

## 2014-10-09 DIAGNOSIS — Z66 Do not resuscitate: Secondary | ICD-10-CM | POA: Diagnosis present

## 2014-10-09 DIAGNOSIS — A419 Sepsis, unspecified organism: Principal | ICD-10-CM | POA: Diagnosis present

## 2014-10-09 DIAGNOSIS — R05 Cough: Secondary | ICD-10-CM

## 2014-10-09 DIAGNOSIS — Z515 Encounter for palliative care: Secondary | ICD-10-CM | POA: Diagnosis present

## 2014-10-09 DIAGNOSIS — E875 Hyperkalemia: Secondary | ICD-10-CM | POA: Diagnosis present

## 2014-10-09 DIAGNOSIS — J9811 Atelectasis: Secondary | ICD-10-CM | POA: Diagnosis not present

## 2014-10-09 DIAGNOSIS — I4891 Unspecified atrial fibrillation: Secondary | ICD-10-CM | POA: Diagnosis not present

## 2014-10-09 DIAGNOSIS — J189 Pneumonia, unspecified organism: Secondary | ICD-10-CM | POA: Diagnosis present

## 2014-10-09 DIAGNOSIS — G9341 Metabolic encephalopathy: Secondary | ICD-10-CM | POA: Diagnosis present

## 2014-10-09 DIAGNOSIS — I248 Other forms of acute ischemic heart disease: Secondary | ICD-10-CM | POA: Diagnosis present

## 2014-10-09 DIAGNOSIS — R7989 Other specified abnormal findings of blood chemistry: Secondary | ICD-10-CM | POA: Diagnosis not present

## 2014-10-09 DIAGNOSIS — R0600 Dyspnea, unspecified: Secondary | ICD-10-CM

## 2014-10-09 DIAGNOSIS — R059 Cough, unspecified: Secondary | ICD-10-CM

## 2014-10-09 DIAGNOSIS — I5021 Acute systolic (congestive) heart failure: Secondary | ICD-10-CM

## 2014-10-09 DIAGNOSIS — J90 Pleural effusion, not elsewhere classified: Secondary | ICD-10-CM | POA: Diagnosis not present

## 2014-10-09 DIAGNOSIS — Z7951 Long term (current) use of inhaled steroids: Secondary | ICD-10-CM | POA: Diagnosis not present

## 2014-10-09 DIAGNOSIS — F0390 Unspecified dementia without behavioral disturbance: Secondary | ICD-10-CM | POA: Diagnosis present

## 2014-10-09 DIAGNOSIS — E785 Hyperlipidemia, unspecified: Secondary | ICD-10-CM | POA: Diagnosis present

## 2014-10-09 DIAGNOSIS — D638 Anemia in other chronic diseases classified elsewhere: Secondary | ICD-10-CM | POA: Diagnosis present

## 2014-10-09 DIAGNOSIS — J811 Chronic pulmonary edema: Secondary | ICD-10-CM | POA: Diagnosis not present

## 2014-10-09 DIAGNOSIS — J449 Chronic obstructive pulmonary disease, unspecified: Secondary | ICD-10-CM | POA: Diagnosis not present

## 2014-10-09 DIAGNOSIS — Y95 Nosocomial condition: Secondary | ICD-10-CM | POA: Diagnosis present

## 2014-10-09 DIAGNOSIS — I5033 Acute on chronic diastolic (congestive) heart failure: Secondary | ICD-10-CM | POA: Diagnosis present

## 2014-10-09 DIAGNOSIS — G9340 Encephalopathy, unspecified: Secondary | ICD-10-CM | POA: Diagnosis not present

## 2014-10-09 DIAGNOSIS — J9621 Acute and chronic respiratory failure with hypoxia: Secondary | ICD-10-CM | POA: Diagnosis present

## 2014-10-09 DIAGNOSIS — I482 Chronic atrial fibrillation: Secondary | ICD-10-CM | POA: Diagnosis present

## 2014-10-09 DIAGNOSIS — J432 Centrilobular emphysema: Secondary | ICD-10-CM | POA: Diagnosis not present

## 2014-10-09 DIAGNOSIS — Z79899 Other long term (current) drug therapy: Secondary | ICD-10-CM

## 2014-10-09 DIAGNOSIS — Z6831 Body mass index (BMI) 31.0-31.9, adult: Secondary | ICD-10-CM | POA: Diagnosis not present

## 2014-10-09 DIAGNOSIS — Z8249 Family history of ischemic heart disease and other diseases of the circulatory system: Secondary | ICD-10-CM

## 2014-10-09 DIAGNOSIS — Z7901 Long term (current) use of anticoagulants: Secondary | ICD-10-CM | POA: Diagnosis not present

## 2014-10-09 DIAGNOSIS — J441 Chronic obstructive pulmonary disease with (acute) exacerbation: Secondary | ICD-10-CM | POA: Diagnosis present

## 2014-10-09 DIAGNOSIS — Z9889 Other specified postprocedural states: Secondary | ICD-10-CM | POA: Diagnosis not present

## 2014-10-09 DIAGNOSIS — J181 Lobar pneumonia, unspecified organism: Secondary | ICD-10-CM | POA: Diagnosis not present

## 2014-10-09 DIAGNOSIS — I517 Cardiomegaly: Secondary | ICD-10-CM | POA: Diagnosis not present

## 2014-10-09 DIAGNOSIS — E872 Acidosis: Secondary | ICD-10-CM | POA: Diagnosis present

## 2014-10-09 DIAGNOSIS — R091 Pleurisy: Secondary | ICD-10-CM | POA: Diagnosis not present

## 2014-10-09 DIAGNOSIS — Z881 Allergy status to other antibiotic agents status: Secondary | ICD-10-CM

## 2014-10-09 DIAGNOSIS — Z87891 Personal history of nicotine dependence: Secondary | ICD-10-CM | POA: Diagnosis not present

## 2014-10-09 DIAGNOSIS — J9600 Acute respiratory failure, unspecified whether with hypoxia or hypercapnia: Secondary | ICD-10-CM | POA: Diagnosis not present

## 2014-10-09 DIAGNOSIS — Z5189 Encounter for other specified aftercare: Secondary | ICD-10-CM | POA: Diagnosis not present

## 2014-10-09 DIAGNOSIS — I5031 Acute diastolic (congestive) heart failure: Secondary | ICD-10-CM | POA: Diagnosis not present

## 2014-10-09 DIAGNOSIS — Z803 Family history of malignant neoplasm of breast: Secondary | ICD-10-CM | POA: Diagnosis not present

## 2014-10-09 DIAGNOSIS — J9622 Acute and chronic respiratory failure with hypercapnia: Secondary | ICD-10-CM | POA: Diagnosis present

## 2014-10-09 DIAGNOSIS — Z880 Allergy status to penicillin: Secondary | ICD-10-CM | POA: Diagnosis not present

## 2014-10-09 DIAGNOSIS — J9601 Acute respiratory failure with hypoxia: Secondary | ICD-10-CM | POA: Diagnosis present

## 2014-10-09 DIAGNOSIS — R778 Other specified abnormalities of plasma proteins: Secondary | ICD-10-CM | POA: Diagnosis present

## 2014-10-09 DIAGNOSIS — R0602 Shortness of breath: Secondary | ICD-10-CM | POA: Diagnosis not present

## 2014-10-09 DIAGNOSIS — I10 Essential (primary) hypertension: Secondary | ICD-10-CM | POA: Diagnosis not present

## 2014-10-09 DIAGNOSIS — D649 Anemia, unspecified: Secondary | ICD-10-CM | POA: Diagnosis not present

## 2014-10-09 DIAGNOSIS — I2510 Atherosclerotic heart disease of native coronary artery without angina pectoris: Secondary | ICD-10-CM | POA: Diagnosis not present

## 2014-10-09 LAB — APTT: APTT: 45 s — AB (ref 24–37)

## 2014-10-09 LAB — CBC WITH DIFFERENTIAL/PLATELET
BASOS PCT: 0 % (ref 0–1)
Basophils Absolute: 0 10*3/uL (ref 0.0–0.1)
Basophils Absolute: 0 10*3/uL (ref 0.0–0.1)
Basophils Relative: 0 % (ref 0–1)
EOS ABS: 0 10*3/uL (ref 0.0–0.7)
EOS ABS: 0 10*3/uL (ref 0.0–0.7)
Eosinophils Relative: 0 % (ref 0–5)
Eosinophils Relative: 0 % (ref 0–5)
HCT: 37.3 % (ref 36.0–46.0)
HEMATOCRIT: 36.6 % (ref 36.0–46.0)
HEMOGLOBIN: 11.1 g/dL — AB (ref 12.0–15.0)
Hemoglobin: 11.3 g/dL — ABNORMAL LOW (ref 12.0–15.0)
LYMPHS ABS: 0.5 10*3/uL — AB (ref 0.7–4.0)
Lymphocytes Relative: 10 % — ABNORMAL LOW (ref 12–46)
Lymphocytes Relative: 5 % — ABNORMAL LOW (ref 12–46)
Lymphs Abs: 1.3 10*3/uL (ref 0.7–4.0)
MCH: 29.8 pg (ref 26.0–34.0)
MCH: 30.2 pg (ref 26.0–34.0)
MCHC: 30.3 g/dL (ref 30.0–36.0)
MCHC: 30.3 g/dL (ref 30.0–36.0)
MCV: 98.4 fL (ref 78.0–100.0)
MCV: 99.7 fL (ref 78.0–100.0)
MONO ABS: 0.2 10*3/uL (ref 0.1–1.0)
MONOS PCT: 2 % — AB (ref 3–12)
Monocytes Absolute: 1 10*3/uL (ref 0.1–1.0)
Monocytes Relative: 8 % (ref 3–12)
NEUTROS ABS: 10.2 10*3/uL — AB (ref 1.7–7.7)
NEUTROS PCT: 82 % — AB (ref 43–77)
Neutro Abs: 10.7 10*3/uL — ABNORMAL HIGH (ref 1.7–7.7)
Neutrophils Relative %: 93 % — ABNORMAL HIGH (ref 43–77)
Platelets: 253 10*3/uL (ref 150–400)
Platelets: 266 10*3/uL (ref 150–400)
RBC: 3.79 MIL/uL — ABNORMAL LOW (ref 3.87–5.11)
RBC: 3.67 MIL/uL — AB (ref 3.87–5.11)
RDW: 17.9 % — AB (ref 11.5–15.5)
RDW: 17.9 % — ABNORMAL HIGH (ref 11.5–15.5)
WBC: 12.5 10*3/uL — ABNORMAL HIGH (ref 4.0–10.5)
WBC: 11.4 10*3/uL — ABNORMAL HIGH (ref 4.0–10.5)

## 2014-10-09 LAB — BRAIN NATRIURETIC PEPTIDE
B Natriuretic Peptide: 248.2 pg/mL — ABNORMAL HIGH (ref 0.0–100.0)
B Natriuretic Peptide: 309.6 pg/mL — ABNORMAL HIGH (ref 0.0–100.0)

## 2014-10-09 LAB — COMPREHENSIVE METABOLIC PANEL
ALK PHOS: 81 U/L (ref 39–117)
ALT: 23 U/L (ref 0–35)
AST: 27 U/L (ref 0–37)
Albumin: 2.9 g/dL — ABNORMAL LOW (ref 3.5–5.2)
Anion gap: 9 (ref 5–15)
BILIRUBIN TOTAL: 0.8 mg/dL (ref 0.3–1.2)
BUN: 21 mg/dL (ref 6–23)
CHLORIDE: 96 mmol/L (ref 96–112)
CO2: 34 mmol/L — ABNORMAL HIGH (ref 19–32)
Calcium: 8.6 mg/dL (ref 8.4–10.5)
Creatinine, Ser: 0.76 mg/dL (ref 0.50–1.10)
GFR calc Af Amer: 85 mL/min — ABNORMAL LOW (ref >90)
GFR calc non Af Amer: 73 mL/min — ABNORMAL LOW (ref >90)
Glucose, Bld: 205 mg/dL — ABNORMAL HIGH (ref 70–99)
Potassium: 4.4 mmol/L (ref 3.5–5.1)
Sodium: 139 mmol/L (ref 135–145)
Total Protein: 7 g/dL (ref 6.0–8.3)

## 2014-10-09 LAB — PROTIME-INR
INR: 1.36 (ref 0.00–1.49)
Prothrombin Time: 16.9 seconds — ABNORMAL HIGH (ref 11.6–15.2)

## 2014-10-09 LAB — BLOOD GAS, ARTERIAL
Acid-Base Excess: 7.2 mmol/L — ABNORMAL HIGH (ref 0.0–2.0)
Bicarbonate: 34.5 mEq/L — ABNORMAL HIGH (ref 20.0–24.0)
Drawn by: 331471
O2 Content: 2 L/min
O2 SAT: 93 %
PATIENT TEMPERATURE: 98.6
PH ART: 7.363 (ref 7.350–7.450)
TCO2: 30.6 mmol/L (ref 0–100)
pCO2 arterial: 62.1 mmHg (ref 35.0–45.0)
pO2, Arterial: 72 mmHg — ABNORMAL LOW (ref 80.0–100.0)

## 2014-10-09 LAB — BASIC METABOLIC PANEL
Anion gap: 14 (ref 5–15)
BUN: 22 mg/dL (ref 6–23)
CHLORIDE: 93 mmol/L — AB (ref 96–112)
CO2: 33 mmol/L — ABNORMAL HIGH (ref 19–32)
Calcium: 9 mg/dL (ref 8.4–10.5)
Creatinine, Ser: 0.65 mg/dL (ref 0.50–1.10)
GFR calc non Af Amer: 77 mL/min — ABNORMAL LOW (ref >90)
GFR, EST AFRICAN AMERICAN: 89 mL/min — AB (ref >90)
GLUCOSE: 149 mg/dL — AB (ref 70–99)
POTASSIUM: 4.3 mmol/L (ref 3.5–5.1)
Sodium: 140 mmol/L (ref 135–145)

## 2014-10-09 LAB — DIGOXIN LEVEL: Digoxin Level: 1.5 ng/mL (ref 0.8–2.0)

## 2014-10-09 LAB — MAGNESIUM: MAGNESIUM: 2.2 mg/dL (ref 1.5–2.5)

## 2014-10-09 LAB — LACTIC ACID, PLASMA: Lactic Acid, Venous: 3.8 mmol/L (ref 0.5–2.0)

## 2014-10-09 LAB — TROPONIN I
TROPONIN I: 0.06 ng/mL — AB (ref <0.031)
TROPONIN I: 0.05 ng/mL — AB (ref <0.031)
Troponin I: 0.07 ng/mL — ABNORMAL HIGH (ref <0.031)

## 2014-10-09 LAB — TSH: TSH: 1.297 u[IU]/mL (ref 0.350–4.500)

## 2014-10-09 LAB — PROCALCITONIN: Procalcitonin: 0.25 ng/mL

## 2014-10-09 MED ORDER — MORPHINE SULFATE 10 MG/5ML PO SOLN: 2.5000 mg | ORAL | Status: DC | PRN

## 2014-10-09 MED ORDER — METHYLPREDNISOLONE SODIUM SUCC 125 MG IJ SOLR
125.0000 mg | Freq: Once | INTRAMUSCULAR | Status: AC
  Administered 2014-10-09: 125 mg via INTRAVENOUS
  Filled 2014-10-09: qty 2

## 2014-10-09 MED ORDER — ALBUTEROL SULFATE (2.5 MG/3ML) 0.083% IN NEBU
5.0000 mg | INHALATION_SOLUTION | Freq: Once | RESPIRATORY_TRACT | Status: AC
  Administered 2014-10-09: 5 mg via RESPIRATORY_TRACT
  Filled 2014-10-09: qty 6

## 2014-10-09 MED ORDER — AZITHROMYCIN 500 MG IV SOLR
500.0000 mg | INTRAVENOUS | Status: DC
  Administered 2014-10-09 – 2014-10-11 (×3): 500 mg via INTRAVENOUS
  Filled 2014-10-09 (×3): qty 500

## 2014-10-09 MED ORDER — DILTIAZEM HCL 30 MG PO TABS
30.0000 mg | ORAL_TABLET | Freq: Two times a day (BID) | ORAL | Status: DC
  Administered 2014-10-09 – 2014-10-12 (×6): 30 mg via ORAL
  Filled 2014-10-09 (×8): qty 1

## 2014-10-09 MED ORDER — DOCUSATE SODIUM 100 MG PO CAPS
100.0000 mg | ORAL_CAPSULE | Freq: Every day | ORAL | Status: DC
  Administered 2014-10-09 – 2014-10-12 (×4): 100 mg via ORAL
  Filled 2014-10-09 (×4): qty 1

## 2014-10-09 MED ORDER — IPRATROPIUM-ALBUTEROL 0.5-2.5 (3) MG/3ML IN SOLN: 3.0000 mL | RESPIRATORY_TRACT | Status: DC | PRN

## 2014-10-09 MED ORDER — RIVAROXABAN 20 MG PO TABS
20.0000 mg | ORAL_TABLET | Freq: Every day | ORAL | Status: DC
  Administered 2014-10-09 – 2014-10-12 (×4): 20 mg via ORAL
  Filled 2014-10-09 (×4): qty 1

## 2014-10-09 MED ORDER — ADULT MULTIVITAMIN W/MINERALS CH
1.0000 | ORAL_TABLET | Freq: Every day | ORAL | Status: DC
  Administered 2014-10-09 – 2014-10-12 (×4): 1 via ORAL
  Filled 2014-10-09 (×4): qty 1

## 2014-10-09 MED ORDER — DIGOXIN 125 MCG PO TABS
0.2500 mg | ORAL_TABLET | ORAL | Status: DC
  Administered 2014-10-09 – 2014-10-11 (×2): 0.25 mg via ORAL
  Filled 2014-10-09 (×2): qty 2

## 2014-10-09 MED ORDER — FUROSEMIDE 10 MG/ML IJ SOLN
40.0000 mg | Freq: Every day | INTRAMUSCULAR | Status: DC
  Administered 2014-10-10 – 2014-10-12 (×3): 40 mg via INTRAVENOUS
  Filled 2014-10-09 (×3): qty 4

## 2014-10-09 MED ORDER — MORPHINE SULFATE 2 MG/ML IJ SOLN: 1.0000 mg | INTRAMUSCULAR | Status: DC | PRN

## 2014-10-09 MED ORDER — DEXTROSE 5 % IV SOLN
1.0000 g | INTRAVENOUS | Status: DC
  Administered 2014-10-09 – 2014-10-11 (×3): 1 g via INTRAVENOUS
  Filled 2014-10-09 (×4): qty 10

## 2014-10-09 MED ORDER — POTASSIUM CHLORIDE CRYS ER 20 MEQ PO TBCR
20.0000 meq | EXTENDED_RELEASE_TABLET | Freq: Two times a day (BID) | ORAL | Status: DC
  Administered 2014-10-09 – 2014-10-10 (×2): 20 meq via ORAL
  Filled 2014-10-09 (×4): qty 1

## 2014-10-09 MED ORDER — ACETAMINOPHEN 325 MG PO TABS
650.0000 mg | ORAL_TABLET | ORAL | Status: DC | PRN
  Administered 2014-10-10: 650 mg via ORAL
  Filled 2014-10-09: qty 2

## 2014-10-09 MED ORDER — SODIUM CHLORIDE 0.9 % IJ SOLN
3.0000 mL | Freq: Two times a day (BID) | INTRAMUSCULAR | Status: DC
  Administered 2014-10-09 – 2014-10-12 (×5): 3 mL via INTRAVENOUS

## 2014-10-09 MED ORDER — RESOURCE INSTANT PROTEIN PO PWD PACKET
1.0000 | Freq: Two times a day (BID) | ORAL | Status: DC
  Administered 2014-10-09 – 2014-10-12 (×5): 6 g via ORAL
  Filled 2014-10-09 (×3): qty 6
  Filled 2014-10-09: qty 227

## 2014-10-09 MED ORDER — DECUBI-VITE PO CAPS
1.0000 | ORAL_CAPSULE | Freq: Every day | ORAL | Status: DC
Start: 2014-10-09 — End: 2014-10-09

## 2014-10-09 MED ORDER — ONDANSETRON HCL 4 MG/2ML IJ SOLN: 4.0000 mg | Freq: Four times a day (QID) | INTRAMUSCULAR | Status: DC | PRN

## 2014-10-09 MED ORDER — DECUBI-VITE PO CAPS: 1.0000 | ORAL_CAPSULE | Freq: Every day | ORAL | Status: DC

## 2014-10-09 MED ORDER — IPRATROPIUM-ALBUTEROL 0.5-2.5 (3) MG/3ML IN SOLN
3.0000 mL | RESPIRATORY_TRACT | Status: DC
  Administered 2014-10-09 – 2014-10-12 (×14): 3 mL via RESPIRATORY_TRACT
  Filled 2014-10-09 (×15): qty 3

## 2014-10-09 MED ORDER — DIGOXIN 125 MCG PO TABS
0.1250 mg | ORAL_TABLET | ORAL | Status: DC
  Administered 2014-10-10 – 2014-10-12 (×2): 0.125 mg via ORAL
  Filled 2014-10-09 (×2): qty 1

## 2014-10-09 MED ORDER — SODIUM CHLORIDE 0.9 % IJ SOLN: 3.0000 mL | INTRAMUSCULAR | Status: DC | PRN

## 2014-10-09 MED ORDER — SODIUM CHLORIDE 0.9 % IV SOLN: 250.0000 mL | INTRAVENOUS | Status: DC | PRN

## 2014-10-09 MED ORDER — GUAIFENESIN ER 600 MG PO TB12: 600.0000 mg | ORAL_TABLET | Freq: Two times a day (BID) | ORAL | Status: DC | PRN

## 2014-10-09 MED ORDER — IPRATROPIUM BROMIDE 0.02 % IN SOLN
0.5000 mg | RESPIRATORY_TRACT | Status: AC
  Administered 2014-10-09: 0.5 mg via RESPIRATORY_TRACT
  Filled 2014-10-09: qty 2.5

## 2014-10-09 MED ORDER — FUROSEMIDE 10 MG/ML IJ SOLN
40.0000 mg | INTRAMUSCULAR | Status: AC
  Administered 2014-10-09: 40 mg via INTRAVENOUS
  Filled 2014-10-09: qty 4

## 2014-10-09 NOTE — ED Provider Notes (Signed)
CSN: [id number]     Arrival date & time 10/09/14  1207 History   First MD Initiated Contact with Patient 10/09/14 1211     Chief Complaint  Patient presents with  . Shortness of Breath     (Consider location/radiation/quality/duration/timing/severity/associated sxs/prior Treatment) HPI Comments: 81 year old female, she has a history of atrial fibrillation, congestive heart failure and COPD. She is also a dementia patient that has very poor memory. According to staff at the nursing facility the patient became acutely short of breath overnight and has been very short of breath today. Paramedics found the patient to be in the mid to low 80% range on her as needed oxygen, required a nonrebreather to get her oxygen to the mid 90% range. She was visibly dyspneic, wheezing in all lung fields and treated with albuterol prior to arrival. When asked was wrong the patient states nothing is wrong, she is very tachypneic when she says this.  Patient is a 81 y.o. female presenting with shortness of breath. The history is provided by the patient.  Shortness of Breath   Past Medical History  Diagnosis Date  . Arrhythmia   . Arthritis   . Atrial fibrillation     Remains stable (As of 05/09/13)  . H/O blood clots   . Hyperlipemia   . GERD (gastroesophageal reflux disease)     Stable (As of 05/09/13)  . Osteoarthritis     Denies pain (As of 05/09/13)  . CHF (congestive heart failure)     chronic diastolic  . COPD (chronic obstructive pulmonary disease)     w/exacerbation  . Dementia     Remains stable & continues to function adequately in the current living environment with supervision. Has had little changes in behavior (AS OF 05/09/13)   Past Surgical History  Procedure Laterality Date  . Appendectomy    . Gallbladder surgery    . Hip surgery      Right-Metal plate   . Leg surgery      Left leg   Family History  Problem Relation Age of Onset  . Colon cancer Neg Hx   . Heart disease  Father   . Breast cancer Daughter    History  Substance Use Topics  . Smoking status: Former Research scientist (life sciences)  . Smokeless tobacco: Never Used  . Alcohol Use: No   OB History    No data available     Review of Systems  Respiratory: Positive for shortness of breath.   All other systems reviewed and are negative.     Allergies  Chocolate; Ciprofloxacin; Coffee bean extract; Coffee flavor; Eggs or egg-derived products; Gentamycin; Imodium; Penicillins; Sulfa antibiotics; and Tea  Home Medications   Prior to Admission medications   Medication Sig Start Date End Date Taking? Authorizing Provider  acetaminophen (TYLENOL) 500 MG tablet Take 500 mg by mouth 3 (three) times daily.    Yes Historical Provider, MD  budesonide-formoterol (SYMBICORT) 160-4.5 MCG/ACT inhaler Inhale 2 puffs into the lungs 2 (two) times daily.   Yes Historical Provider, MD  Calcium Carbonate-Vitamin D (CALTRATE 600+D PO) Take 1 tablet by mouth every morning. For calcium supplement   Yes Historical Provider, MD  digoxin (LANOXIN) 0.125 MG tablet Take 0.125 mg by mouth every other day.   Yes Historical Provider, MD  digoxin (LANOXIN) 0.25 MG tablet Take 0.25 mg by mouth every other day. Alternate between  0.125 and 0.250   Yes Historical Provider, MD  diltiazem (CARDIZEM CD) 120 MG 24 hr capsule Take  120 mg by mouth daily. For A-Fib 07/02/13  Yes Bobby Rumpf York, PA-C  Docusate Sodium (DSS) 100 MG CAPS Take 100 mg by mouth daily. For constipation 07/02/13  Yes Bobby Rumpf York, PA-C  furosemide (LASIX) 40 MG tablet Take 40 mg by mouth 2 (two) times daily.   Yes Historical Provider, MD  guaiFENesin (MUCINEX) 600 MG 12 hr tablet Take 1 tablet (600 mg total) by mouth 2 (two) times daily. 09/18/14  Yes Bonnielee Haff, MD  ipratropium-albuterol (DUONEB) 0.5-2.5 (3) MG/3ML SOLN 29ml nebulized 4 times daily scheduled and every 4hrs as needed for wheezing for 3 days. And then every 4hours as needed for wheezing. 09/18/14  Yes Bonnielee Haff,  MD  Multiple Vitamins-Minerals (DECUBI-VITE) CAPS Take 1 capsule by mouth daily.   Yes Historical Provider, MD  Nutritional Supplements (NUTRITIONAL DRINK PO) Take 120 mLs by mouth 2 (two) times daily. MIGHTY SHAKES   Yes Historical Provider, MD  Polyethyl Glycol-Propyl Glycol (SYSTANE OP) Place 1 drop into both eyes 2 (two) times daily. For dry eyes   Yes Historical Provider, MD  potassium chloride SA (K-DUR,KLOR-CON) 20 MEQ tablet Take 20 mEq by mouth 2 (two) times daily.   Yes Historical Provider, MD  Protein (PROCEL) POWD Take 2 scoop by mouth 2 (two) times daily.   Yes Historical Provider, MD  Rivaroxaban (XARELTO) 20 MG TABS tablet Take 20 mg by mouth daily. For DVT   Yes Historical Provider, MD   BP 149/61 mmHg  Pulse 102  Temp(Src) 98.9 F (37.2 C) (Oral)  Resp 45  SpO2 94% Physical Exam  Constitutional: She appears well-developed and well-nourished. She appears distressed.  HENT:  Head: Normocephalic and atraumatic.  Mouth/Throat: No oropharyngeal exudate.  Dry mucous membranes  Eyes: Conjunctivae and EOM are normal. Pupils are equal, round, and reactive to light. Right eye exhibits no discharge. Left eye exhibits no discharge. No scleral icterus.  Neck: Normal range of motion. Neck supple. No JVD present. No thyromegaly present.  Cardiovascular: Normal heart sounds and intact distal pulses.  Exam reveals no gallop and no friction rub.   No murmur heard. Tachycardia, atrial fibrillation with rapid ventricular rate  Pulmonary/Chest: She is in respiratory distress. She has wheezes. She has no rales.  Wheezing in all lung fields  Abdominal: Soft. Bowel sounds are normal. She exhibits no distension and no mass. There is no tenderness.  Musculoskeletal: Normal range of motion. She exhibits no edema or tenderness.  Lymphadenopathy:    She has no cervical adenopathy.  Neurological: She is alert. Coordination normal.  Skin: Skin is warm and dry. No rash noted. No erythema.   Psychiatric: She has a normal mood and affect. Her behavior is normal.  Nursing note and vitals reviewed.   ED Course  Procedures (including critical care time) Labs Review Labs Reviewed  CBC WITH DIFFERENTIAL/PLATELET - Abnormal; Notable for the following:    WBC 12.5 (*)    RBC 3.79 (*)    Hemoglobin 11.3 (*)    RDW 17.9 (*)    Neutrophils Relative % 82 (*)    Neutro Abs 10.2 (*)    Lymphocytes Relative 10 (*)    All other components within normal limits  BLOOD GAS, ARTERIAL - Abnormal; Notable for the following:    pCO2 arterial 62.1 (*)    pO2, Arterial 72.0 (*)    Bicarbonate 34.5 (*)    Acid-Base Excess 7.2 (*)    All other components within normal limits  BRAIN NATRIURETIC PEPTIDE  TROPONIN  I  BASIC METABOLIC PANEL    Imaging Review Dg Chest Port 1 View  10/09/2014   CLINICAL DATA:  Progressive shortness of breath for 2 days.  Cough  EXAM: PORTABLE CHEST - 1 VIEW  COMPARISON:  September 16, 2014  FINDINGS: There is cardiomegaly with pulmonary venous hypertension. There are bilateral pleural effusions, larger on the left than on the right. There is mild interstitial edema with bilateral lower lobe atelectasis/consolidation. There are old healed rib fractures on the right. No pneumothorax. No adenopathy.  IMPRESSION: Congestive heart failure. Cannot exclude superimposed pneumonia in the bases. Both entities may exist concurrently. Effusion on the left is larger compared to recent prior study.   Electronically Signed   By: Lowella Grip III M.D.   On: 10/09/2014 13:39     EKG Interpretation   Date/Time:  Wednesday October 09 2014 12:14:30 EDT Ventricular Rate:  115 PR Interval:    QRS Duration: 83 QT Interval:  403 QTC Calculation: 557 R Axis:   64 Text Interpretation:  Atrial fibrillation Low voltage, precordial leads  Borderline T abnormalities, anterior leads Prolonged QT interval Baseline  wander in lead(s) II since last tracing no significant change Confirmed by   Sabra Heck  MD, Georgine Wiltse ([id number]) on 10/09/2014 12:31:11 PM      MDM   Final diagnoses:  Cough  Acute systolic congestive heart failure    The patient has atrial fibrillation with RVR, but appears to be significant wheezing, oxygen saturations after albuterol nebulizer are up to the low 90% range on high flow nonrebreather, check chest x-ray, labs, add Atrovent and steroids.  Care was discussed with the hospitalist who will admit the patient to the hospital for acute congestive heart failure exacerbation. Chest x-ray reveals diffuse bilateral pulmonary edema with a pleural effusion, white blood cell count of 12,500, hemoglobin of 11, ABG shows hypercapnia and hypoxia, slight acidosis.  Meds given in ED:  Medications  furosemide (LASIX) injection 40 mg (not administered)  albuterol (PROVENTIL) (2.5 MG/3ML) 0.083% nebulizer solution 5 mg (5 mg Nebulization Given 10/09/14 1239)  ipratropium (ATROVENT) nebulizer solution 0.5 mg (0.5 mg Nebulization Given 10/09/14 1240)  methylPREDNISolone sodium succinate (SOLU-MEDROL) 125 mg/2 mL injection 125 mg (125 mg Intravenous Given 10/09/14 1258)    New Prescriptions   No medications on file      Noemi Chapel, MD 10/09/14 1449

## 2014-10-09 NOTE — Progress Notes (Signed)
CRITICAL VALUE ALERT  Critical value received:  Lactic acid 3.8  Date of notification:  10/09/14  Time of notification:  2134  Critical value read back:Yes.    Nurse who received alert:  Virgina Norfolk  MD notified (1st page):  Uvaldo Rising  Time of first page:  2138  MD notified (2nd page):  Time of second page:  Responding MD: Hal Hope  Time MD responded:  2140

## 2014-10-09 NOTE — ED Notes (Signed)
Staff at [facility] noted pt. To have shortness of breath today.  Her spo2 is 92-94% on non-rebreather.  She is short of breath, but is not dyspneic, and is in no distress.  She is chronically in a-fib. And remains so.

## 2014-10-09 NOTE — Progress Notes (Signed)
Order placed for sepsis order set. Obtain lactic acid and procalcitonin level. Blood cultures already collected. Leisa Lenz [facility] [phone]

## 2014-10-09 NOTE — ED Notes (Signed)
She is much more comfortable in appearance and is calmly visiting with her daughter.

## 2014-10-09 NOTE — H&P (Signed)
[facility] History and Physical  Jennifer Nielsen NHA:[id number] DOB: 05-06-1925 DOA: 10/09/2014  Referring physician: ER physician Dr. Noemi Chapel  PCP: [name], MD  Chief Complaint: shortness of breath   HPI:  81 year old female with past medical history of COPD, dementia, atrial fibrillation on anticoagulation with xarelto, mild chronic diastolic CHF (last 2 D ECHO in 08/2014 with preserved EF), recent hospitalization for COPD exacerbation who presented from [facility] place due to reports of worsening shortness of breath since the night prior to the admission with associated wheezing and requiring NRM to keep O2 sats above 90%. Pt is not reliable historian due to dementia. She does not know why she was brought to hospital. No reports of chest pain, palpitations. No lightheadedness or falls. No reports of diarrhea or constipation. No reports of blood in stool or urine. No fevers or chills.  Pt initially seen by paramedic and O2 sats at that time in 80's but improved with NRM. In ED, BP was 134/89, HR 78-115, RR 22-47. Oxygen saturation was 91-97% with [place] oxygen support. Blood work was notable for WBC count 12.5, hemoglobin 1.3, troponin 0.07. The 12 lead EKG showed atrial fibrillation. CXR showed CHF with superimposed pneumonia and left pleural effusion. She was started on azithromycin and rocephin on admission. She was admitted to telemetry unit.    Assessment & Plan    Acute on chronic respiratory failure with hypoxia / Healthcare associated pneumonia / Acute diastolic CHF exacerbation / left pleural effusion - Hypoxia likely due to combination of pneumonia, diastolic CHF and left pleural effusion as seen on CXR. I think she also may have COPD exacerbation but CHF and pleural effusion seem more contributory to hypoxia because she also has (as noted below) mild troponin elevation. I would attribute this to CHF rather than COPD. - Patient started on azithromycin and rocephin for now; has  had PCN allergy and cipro allergy but per pharmacy did not have trouble with azithro and rocephin on recent hospitalization. Her vitals are stable and she has no fever. If she spikes fever or clinically declines we will change abx to vanco and cefepime. - Pneumonia order set placed. Follow up strep pneumonia and legionella results. Recent influenza panel 4/3 - negative. Since no fevers, influenza panel not collected at this time.  - In regards to acute diastolic CHF exacerbation, BNP was mildly elevated at 248. Her last 2 D ECHO in 08/2014 showed preserved EF. She was given lasix in ED 40 mg IV which helped with work of breathing. We will continue lasix 40 mg IV daily. - In regards to left pleural effusion, order placed for US guided thoracentesis. She did have thoracentesis on recent admission, 09/15/14 with 1 L fluid removed. - Palliative care consulted for goals of care. She is DNR/DNI. Appreciate palliative care team discussing goals of care.   Active Problems: Sepsis due to pneumonia - Sepsis criteria met on admission with tachycardia, tachypnea, hypoxia, leukocytosis.  - Sepsis order set placed - Follow up blood culture results, procalcitonin and lactic acid result. - Started azithromycin and rocephin  - hemodynamically stable, does not require pressor support   Chronic obstructive pulmonary disease exacerbation - Started on duoneb every 4 hours scheduled and every 4 hours PRN shortness of breath or wheezing. - Added low dose morphine to help with work of breathing - Abx started for pneumonia  - Continue oxygen support via [place] to keep O2 sat above 90% - She was given solu-medrol 125 mg IV in  ED. She was on prednisone taper on recent admission. Will see how pt feels in am and if she needs steroids will start them again but for now she got good enough dose in ED.  Chronic atrial fibrillation - CHADS vasc score at least 3 - On anticoagulation with xarelto - Rate controlled with Cardizem. Her  home dose is 120 mg daily. We started Cardizem 30 mg every 12 hours due to acute CHF and since HR 78 but will increase if her HR worsens - Continue digoxin.  Mild troponin elevation - Likely duet o demand ischemia from CHF exacerbation - Troponin trend: 0.07 --> 0.06 - No reports of chest pain - The 12 lead EKG showed atrial fibrillation   DVT prophylaxis:  - SCD's bilaterally   Radiological Exams on Admission: Dg Chest Port 1 View 10/09/2014   Congestive heart failure. Cannot exclude superimposed pneumonia in the bases. Both entities may exist concurrently. Effusion on the left is larger compared to recent prior study.      EKG: I have personally reviewed EKG. EKG shows atrial fibrillation   Code Status: DNR/DNI Family Communication: family not at the bedside; please note pt does have baseline dementia.  Disposition Plan: Admit for further evaluation, telemetry admission    Leisa Lenz, MD  [facility] Pager [phone]  Time spent in minutes: 75 minutes  Review of Systems:  Constitutional: Negative for fever, chills and malaise/fatigue. Negative for diaphoresis.  HENT: Negative for hearing loss, ear pain, nosebleeds, congestion, sore throat, neck pain, tinnitus and ear discharge.   Eyes: Negative for blurred vision, double vision, photophobia, pain, discharge and redness.  Respiratory: per HPI.   Cardiovascular: Negative for chest pain, palpitations, orthopnea, claudication and leg swelling.  Gastrointestinal: Negative for nausea, vomiting and abdominal pain. Negative for heartburn, constipation, blood in stool and melena.  Genitourinary: Negative for dysuria, urgency, frequency, hematuria and flank pain.  Musculoskeletal: Negative for myalgias, back pain, joint pain and falls.  Skin: Negative for itching and rash.  Neurological: Negative for dizziness and weakness. Negative for tingling, tremors, sensory change, speech change, focal weakness, loss of consciousness and  headaches.  Endo/Heme/Allergies: Negative for environmental allergies and polydipsia. Does not bruise/bleed easily.  Psychiatric/Behavioral: Negative for suicidal ideas. The patient is not nervous/anxious.      Past Medical History  Diagnosis Date  . Arrhythmia   . Arthritis   . Atrial fibrillation     Remains stable (As of 05/09/13)  . H/O blood clots   . Hyperlipemia   . GERD (gastroesophageal reflux disease)     Stable (As of 05/09/13)  . Osteoarthritis     Denies pain (As of 05/09/13)  . CHF (congestive heart failure)     chronic diastolic  . COPD (chronic obstructive pulmonary disease)     w/exacerbation  . Dementia     Remains stable & continues to function adequately in the current living environment with supervision. Has had little changes in behavior (AS OF 05/09/13)   Past Surgical History  Procedure Laterality Date  . Appendectomy    . Gallbladder surgery    . Hip surgery      Right-Metal plate   . Leg surgery      Left leg   Social History:  reports that she has quit smoking. She has never used smokeless tobacco. She reports that she does not drink alcohol or use illicit drugs.  Allergies  Allergen Reactions  . Chocolate Itching  . Ciprofloxacin Itching  . Coffee Bean  Extract [Coffea Arabica] Itching  . Coffee Flavor Itching  . Eggs Or Egg-Derived Products   . Gentamycin [Gentamicin] Itching  . Imodium [Loperamide] Other (See Comments)    unknown  . Penicillins Itching  . Sulfa Antibiotics Itching  . Tea Itching    Family History:  Family History  Problem Relation Age of Onset  . Colon cancer Neg Hx   . Heart disease Father   . Breast cancer Daughter      Prior to Admission medications   Medication Sig Start Date End Date Taking? Authorizing Provider  acetaminophen (TYLENOL) 500 MG tablet Take 500 mg by mouth 3 (three) times daily.    Yes Historical Provider, MD  budesonide-formoterol (SYMBICORT) 160-4.5 MCG/ACT inhaler Inhale 2 puffs into the  lungs 2 (two) times daily.   Yes Historical Provider, MD  Calcium Carbonate-Vitamin D (CALTRATE 600+D PO) Take 1 tablet by mouth every morning. For calcium supplement   Yes Historical Provider, MD  digoxin (LANOXIN) 0.125 MG tablet Take 0.125 mg by mouth every other day.   Yes Historical Provider, MD  digoxin (LANOXIN) 0.25 MG tablet Take 0.25 mg by mouth every other day. Alternate between  0.125 and 0.250   Yes Historical Provider, MD  diltiazem (CARDIZEM CD) 120 MG 24 hr capsule Take 120 mg by mouth daily. For A-Fib 07/02/13  Yes Bobby Rumpf York, PA-C  Docusate Sodium (DSS) 100 MG CAPS Take 100 mg by mouth daily. For constipation 07/02/13  Yes Bobby Rumpf York, PA-C  furosemide (LASIX) 40 MG tablet Take 40 mg by mouth 2 (two) times daily.   Yes Historical Provider, MD  guaiFENesin (MUCINEX) 600 MG 12 hr tablet Take 1 tablet (600 mg total) by mouth 2 (two) times daily. 09/18/14  Yes Bonnielee Haff, MD  ipratropium-albuterol (DUONEB) 0.5-2.5 (3) MG/3ML SOLN 34ml nebulized 4 times daily scheduled and every 4hrs as needed for wheezing for 3 days. And then every 4hours as needed for wheezing. 09/18/14  Yes Bonnielee Haff, MD  Multiple Vitamins-Minerals (DECUBI-VITE) CAPS Take 1 capsule by mouth daily.   Yes Historical Provider, MD  Nutritional Supplements (NUTRITIONAL DRINK PO) Take 120 mLs by mouth 2 (two) times daily. MIGHTY SHAKES   Yes Historical Provider, MD  Polyethyl Glycol-Propyl Glycol (SYSTANE OP) Place 1 drop into both eyes 2 (two) times daily. For dry eyes   Yes Historical Provider, MD  potassium chloride SA (K-DUR,KLOR-CON) 20 MEQ tablet Take 20 mEq by mouth 2 (two) times daily.   Yes Historical Provider, MD  Protein (PROCEL) POWD Take 2 scoop by mouth 2 (two) times daily.   Yes Historical Provider, MD  Rivaroxaban (XARELTO) 20 MG TABS tablet Take 20 mg by mouth daily. For DVT   Yes Historical Provider, MD   Physical Exam: Filed Vitals:   10/09/14 1214 10/09/14 1215 10/09/14 1242 10/09/14 1244   BP: 146/75   149/61  Pulse: 115   102  Temp: 98.9 F (37.2 C)     TempSrc: Oral     Resp: 47   45  SpO2: 49% 97% 94% 94%    Physical Exam  Constitutional: she is oriented to person only, no distress  HENT: Normocephalic. No tonsillar erythema or exudates Eyes: Conjunctivae and EOM are normal, no scleral icterus.  Neck: Normal ROM. Neck supple. No JVD. No tracheal deviation. No thyromegaly.  CVS: tachycardia, S1/S2 appreciated  Pulmonary: diminished breath sounds, no wheezing.  Abdominal: Soft. BS +,  no distension, tenderness, rebound or guarding.  Musculoskeletal: Normal range of motion.  Trace LE edema but no tenderness.  Lymphadenopathy: No lymphadenopathy noted, cervical, inguinal. Neuro: Alert. Normal reflexes, muscle tone coordination. No focal neurologic deficits. Skin: Skin is warm and dry. No rash noted.  No erythema. No pallor.  Psychiatric: Normal mood and affect.    Labs on Admission:  Basic Metabolic Panel:  Recent Labs Lab 10/09/14 1405  NA 140  K 4.3  CL 93*  CO2 33*  GLUCOSE 149*  BUN 22  CREATININE 0.65  CALCIUM 9.0   Liver Function Tests: No results for input(s): AST, ALT, ALKPHOS, BILITOT, PROT, ALBUMIN in the last 168 hours. No results for input(s): LIPASE, AMYLASE in the last 168 hours. No results for input(s): AMMONIA in the last 168 hours. CBC:  Recent Labs Lab 10/09/14 1405  WBC 12.5*  NEUTROABS 10.2*  HGB 11.3*  HCT 37.3  MCV 98.4  PLT 253   Cardiac Enzymes:  Recent Labs Lab 10/09/14 1405  TROPONINI 0.07*   BNP: Invalid input(s): POCBNP CBG: No results for input(s): GLUCAP in the last 168 hours.  If 7PM-7AM, please contact night-coverage [email or web address] Password [facility] 10/09/2014, 3:08 PM

## 2014-10-09 NOTE — Progress Notes (Signed)
ANTIBIOTIC CONSULT NOTE - INITIAL  Pharmacy Consult for rocephin and azthromycin Indication: CAP  Allergies  Allergen Reactions  . Chocolate Itching  . Ciprofloxacin Itching  . Coffee Bean Extract [Coffea Arabica] Itching  . Coffee Flavor Itching  . Eggs Or Egg-Derived Products   . Gentamycin [Gentamicin] Itching  . Imodium [Loperamide] Other (See Comments)    unknown  . Penicillins Itching  . Sulfa Antibiotics Itching  . Tea Itching    Vital Signs: Temp: 98.9 F (37.2 C) (04/27 1214) Temp Source: Oral (04/27 1214) BP: 134/88 mmHg (04/27 1559) Pulse Rate: 105 (04/27 1559) Intake/Output from previous day:   Intake/Output from this shift:    Labs:  Recent Labs  10/09/14 1405  WBC 12.5*  HGB 11.3*  PLT 253  CREATININE 0.65   CrCl cannot be calculated (Unknown ideal weight.). No results for input(s): VANCOTROUGH, VANCOPEAK, VANCORANDOM, GENTTROUGH, GENTPEAK, GENTRANDOM, TOBRATROUGH, TOBRAPEAK, TOBRARND, AMIKACINPEAK, AMIKACINTROU, AMIKACIN in the last 72 hours.   Microbiology: Recent Results (from the past 720 hour(s))  MRSA PCR Screening     Status: None   Collection Time: 09/15/14  2:25 AM  Result Value Ref Range Status   MRSA by PCR NEGATIVE NEGATIVE Final    Comment:        The GeneXpert MRSA Assay (FDA approved for NASAL specimens only), is one component of a comprehensive MRSA colonization surveillance program. It is not intended to diagnose MRSA infection nor to guide or monitor treatment for MRSA infections.   Body fluid culture     Status: None   Collection Time: 09/15/14 12:09 PM  Result Value Ref Range Status   Specimen Description PLEURAL LEFT  Final   Special Requests NONE  Final   Gram Stain   Final    FEW WBC PRESENT,BOTH PMN AND MONONUCLEAR NO SQUAMOUS EPITHELIAL CELLS SEEN NO ORGANISMS SEEN Performed at [facility]    Culture   Final    NO GROWTH 3 DAYS Performed at [facility]    Report Status 09/18/2014 FINAL   Final    Medical History: Past Medical History  Diagnosis Date  . Arrhythmia   . Arthritis   . Atrial fibrillation     Remains stable (As of 05/09/13)  . H/O blood clots   . Hyperlipemia   . GERD (gastroesophageal reflux disease)     Stable (As of 05/09/13)  . Osteoarthritis     Denies pain (As of 05/09/13)  . CHF (congestive heart failure)     chronic diastolic  . COPD (chronic obstructive pulmonary disease)     w/exacerbation  . Dementia     Remains stable & continues to function adequately in the current living environment with supervision. Has had little changes in behavior (AS OF 05/09/13)    Medications:  See med rec   Assessment: Patient is an 81 y.o F with hx Afib, HF, dementia, and COPD. She presents to the ED from [facility] facility with c/o SOB. CXR is suspicious for PNA.  Of note, patient has a number of allergies including Sulfa, cipro and PCN but tolerated cephalosporins with prior admissions. To start ceftriaxone and azithromycin for PNA.  Goal of Therapy:  Eradication of infection  Plan:  - ceftriaxone 1gm IV q24h - azithromycin 500 mg IV q24h - pharmacy will sign off for these two abx as no renal adjustments are needed for them  Dia Sitter P 10/09/2014,4:01 PM

## 2014-10-09 NOTE — Consult Note (Signed)
Patient Jennifer Nielsen      DOB: 1924-10-02      DGU:[id number]     Consult Note from the Palliative Medicine Team at [place] Requested by: Dr Charlies Silvers     PCP: [name], MD Reason for Consultation: Goals of Care    Phone Number:None  Assessment/Recommendations: 81 yo female with COPD, Afib, HFpEF admitted with acute hypoxic/hypercarbic resp failure.   1.  Code Status: DNR, discussed with family today and she has this documented in living will per their report. Also DNR on prior admits. I have changed order to reflect this.   2. GOC: Spoke briefly with Jennifer Nielsen today and she is unable to tell me why she is in the hospital or issues going on. She was not sure why [facility] brought her to hospital. In speaking with both her daughters this afternoon Jennifer Nielsen, Jennifer Nielsen), they are concerned about this being her 3rd hospitalization in the past 2 months.  She was admitted few weeks ago with similar symptoms and had 1L removed via thoracentecis which lead to improvement.  Fluid analysis was exudative last admit and daughters unsure why this keeps happening. I agree with daughters that recurrent admissions is worrisome and we discussed how this can sometimes be a prognostic sign of how someone with multiple chronic medical issues will do in future.  At baseline, she is wheelchair bound, but enjoys socializing and playing bingo at [place].  They felt she was bouncing back quite well since her last hospitalization until few days ago.  Family hopeful she responds to treatment efforts here. She has required BiPAP in the past, and if breathing declines overnight would want trial of this as well. I will follow-up in morning when we have more diagnostic information back (echo) and see how she responds to interventions here.   3. Symptom Management:   1. Dyspnea- Likely multifactorial with CHF, ?PNA, COPD and increased size of pleural effusion. Previously exudative but unsure we have had great  explanation for etiology.  Symptomatically improved last time she had thora and could consider again if not responding to abx, IV diuresis, etc.  I will add very low dose morphine PRN should her dyspnea worsen.  Appears to have some chronic CO2 retention in reviewing old ABG's.    4. Psychosocial/Spiritual: Widowed, 2 daughters. Former high school Patent attorney.  Has resided at [facility] for about 4 years.     Brief HPI:  Patient is an 81 yo female with PMHx of COPD, Afib on anticoag, HFpEF, dementia, pleural effusion, who was admitted earlier today with shortness of breath from [facility].  At time of my exam she is unaware of why she is in hospital and states that she does not feel Short of breath or that she is having any problems. In speaking with her daughters, they state she started feeling bad on Sunday (she wouldn't elaborate but wasn't acting like herself).  Today she had progressively worsening dyspnea which lead to transfer here.  She has had some cough past few days but nothing else out of the ordinary for her.  Appetite has been good. Denied any F/C, N/V, increased confusion, weakness, etc.  Daughters concerned that the other day she had some trouble swallowing pills.        PMH:  Past Medical History  Diagnosis Date  . Arrhythmia   . Arthritis   . Atrial fibrillation     Remains stable (As of 05/09/13)  . H/O blood clots   .  Hyperlipemia   . GERD (gastroesophageal reflux disease)     Stable (As of 05/09/13)  . Osteoarthritis     Denies pain (As of 05/09/13)  . CHF (congestive heart failure)     chronic diastolic  . COPD (chronic obstructive pulmonary disease)     w/exacerbation  . Dementia     Remains stable & continues to function adequately in the current living environment with supervision. Has had little changes in behavior (AS OF 05/09/13)     PSH: Past Surgical History  Procedure Laterality Date  . Appendectomy    . Gallbladder surgery    . Hip  surgery      Right-Metal plate   . Leg surgery      Left leg   I have reviewed the FH and SH and  If appropriate update it with new information. Allergies  Allergen Reactions  . Chocolate Itching  . Ciprofloxacin Itching  . Coffee Bean Extract [Coffea Arabica] Itching  . Coffee Flavor Itching  . Eggs Or Egg-Derived Products   . Gentamycin [Gentamicin] Itching  . Imodium [Loperamide] Other (See Comments)    unknown  . Penicillins Itching  . Sulfa Antibiotics Itching  . Tea Itching   Scheduled Meds: . azithromycin  500 mg Intravenous Q24H  . cefTRIAXone (ROCEPHIN)  IV  1 g Intravenous Q24H  . [START ON 10/10/2014] digoxin  0.125 mg Oral QODAY  . digoxin  0.25 mg Oral QODAY  . diltiazem  30 mg Oral Q12H  . docusate sodium  100 mg Oral Daily  . [START ON 10/10/2014] furosemide  40 mg Intravenous Daily  . ipratropium-albuterol  3 mL Nebulization Q4H  . multivitamin with minerals  1 tablet Oral Daily  . potassium chloride SA  20 mEq Oral BID  . protein supplement  1 scoop Oral BID  . rivaroxaban  20 mg Oral Daily  . sodium chloride  3 mL Intravenous Q12H   Continuous Infusions:  PRN Meds:.sodium chloride, acetaminophen, guaiFENesin, ipratropium-albuterol, ondansetron (ZOFRAN) IV, sodium chloride    BP 141/62 mmHg  Pulse 92  Temp(Src) 98.1 F (36.7 C) (Oral)  Resp 24  Ht 5\' 5"  (1.651 m)  Wt 82.192 kg (181 lb 3.2 oz)  BMI 30.15 kg/m2  SpO2 93%   PPS: 30  No intake or output data in the 24 hours ending 10/09/14 1731  Physical Exam:  General: Alert, conversational dyspnea HEENT:  [place], sclera anicteric Chest:   CTAB CVS: regular rate Abdomen: soft, ND Ext: warm, mild edema Neuro: oriented to person/place, mild confusion apparent  Labs: CBC    Component Value Date/Time   WBC 12.5* 10/09/2014 1405   RBC 3.79* 10/09/2014 1405   HGB 11.3* 10/09/2014 1405   HCT 37.3 10/09/2014 1405   PLT 253 10/09/2014 1405   MCV 98.4 10/09/2014 1405   MCH 29.8 10/09/2014 1405    MCHC 30.3 10/09/2014 1405   RDW 17.9* 10/09/2014 1405   LYMPHSABS 1.3 10/09/2014 1405   MONOABS 1.0 10/09/2014 1405   EOSABS 0.0 10/09/2014 1405   BASOSABS 0.0 10/09/2014 1405    BMET    Component Value Date/Time   NA 140 10/09/2014 1405   NA 134* 02/25/2014   K 4.3 10/09/2014 1405   CL 93* 10/09/2014 1405   CO2 33* 10/09/2014 1405   GLUCOSE 149* 10/09/2014 1405   BUN 22 10/09/2014 1405   BUN 20 02/25/2014   CREATININE 0.65 10/09/2014 1405   CREATININE 0.6 02/25/2014   CREATININE 0.80 02/22/2014 1214  CALCIUM 9.0 10/09/2014 1405   GFRNONAA 77* 10/09/2014 1405   GFRAA 89* 10/09/2014 1405    CMP     Component Value Date/Time   NA 140 10/09/2014 1405   NA 134* 02/25/2014   K 4.3 10/09/2014 1405   CL 93* 10/09/2014 1405   CO2 33* 10/09/2014 1405   GLUCOSE 149* 10/09/2014 1405   BUN 22 10/09/2014 1405   BUN 20 02/25/2014   CREATININE 0.65 10/09/2014 1405   CREATININE 0.6 02/25/2014   CREATININE 0.80 02/22/2014 1214   CALCIUM 9.0 10/09/2014 1405   PROT 6.4 09/15/2014 0907   ALBUMIN 2.6* 09/15/2014 0907   AST 28 09/15/2014 0907   ALT 28 09/15/2014 0907   ALKPHOS 82 09/15/2014 0907   BILITOT 0.3 09/15/2014 0907   GFRNONAA 77* 10/09/2014 1405   GFRAA 89* 10/09/2014 1405    4/27 CXR IMPRESSION: Congestive heart failure. Cannot exclude superimposed pneumonia in the bases. Both entities may exist concurrently. Effusion on the left is larger compared to recent prior study.   Total Time: 65 minutes Greater than 50%  of this time was spent counseling and coordinating care related to the above assessment and plan.  Doran Clay D.O.  Palliative Medicine Team at [facility]  Team Phone: [phone]

## 2014-10-10 ENCOUNTER — Inpatient Hospital Stay (HOSPITAL_COMMUNITY): Payer: Medicare Other

## 2014-10-10 LAB — GLUCOSE, CAPILLARY: Glucose-Capillary: 190 mg/dL — ABNORMAL HIGH (ref 70–99)

## 2014-10-10 LAB — PROTEIN, BODY FLUID: Total protein, fluid: 3.2 g/dL

## 2014-10-10 LAB — BODY FLUID CELL COUNT WITH DIFFERENTIAL
Eos, Fluid: 0 %
Lymphs, Fluid: 1 %
Monocyte-Macrophage-Serous Fluid: 37 % — ABNORMAL LOW (ref 50–90)
Neutrophil Count, Fluid: 62 % — ABNORMAL HIGH (ref 0–25)
Total Nucleated Cell Count, Fluid: 4220 cu mm — ABNORMAL HIGH (ref 0–1000)

## 2014-10-10 LAB — BASIC METABOLIC PANEL
Anion gap: 9 (ref 5–15)
BUN: 27 mg/dL — ABNORMAL HIGH (ref 6–23)
CO2: 35 mmol/L — AB (ref 19–32)
CREATININE: 0.64 mg/dL (ref 0.50–1.10)
Calcium: 8.7 mg/dL (ref 8.4–10.5)
Chloride: 93 mmol/L — ABNORMAL LOW (ref 96–112)
GFR calc Af Amer: 90 mL/min — ABNORMAL LOW (ref >90)
GFR calc non Af Amer: 77 mL/min — ABNORMAL LOW (ref >90)
Glucose, Bld: 169 mg/dL — ABNORMAL HIGH (ref 70–99)
POTASSIUM: 5.1 mmol/L (ref 3.5–5.1)
Sodium: 137 mmol/L (ref 135–145)

## 2014-10-10 LAB — LEGIONELLA ANTIGEN, URINE

## 2014-10-10 LAB — LACTATE DEHYDROGENASE, PLEURAL OR PERITONEAL FLUID: LD, Fluid: 109 U/L — ABNORMAL HIGH (ref 3–23)

## 2014-10-10 LAB — TROPONIN I: Troponin I: 0.04 ng/mL — ABNORMAL HIGH (ref <0.031)

## 2014-10-10 LAB — LACTIC ACID, PLASMA: Lactic Acid, Venous: 1.9 mmol/L (ref 0.5–2.0)

## 2014-10-10 LAB — STREP PNEUMONIAE URINARY ANTIGEN: STREP PNEUMO URINARY ANTIGEN: NEGATIVE

## 2014-10-10 NOTE — Procedures (Signed)
Successful US guided left thoracentesis. Yielded 700 ml of serous fluid. Pt tolerated procedure well. No immediate complications.  Specimen was sent for labs. CXR ordered.  Tsosie Billing D PA-C 10/10/2014 11:08 AM

## 2014-10-10 NOTE — Progress Notes (Addendum)
Found patient to be lethargic and diaphoretic. CBG 190. NP on call notified. Pt arousable, but goes right back to sleep. Pt in no obvious respiratory distress. VSS. Oxygen sats 100% on 2LNC. NP at bedside to assess patient.   Pt refused night meds. NP on call aware.

## 2014-10-10 NOTE — Progress Notes (Signed)
Patient ID: Jennifer Nielsen, female   DOB: 01-03-1925, 81 y.o.   MRN: [id number]  [facility] PROGRESS NOTE  Prapti Grussing WYO:[id number] DOB: 01/13/1925 DOA: 10/09/2014 PCP: [name], MD   Brief narrative:    81 year old female with COPD, dementia, atrial fibrillation on anticoagulation with xarelto, mild chronic diastolic CHF (last 2 D ECHO in 08/2014 with preserved EF), recent hospitalization for COPD exacerbation who presented from [facility] place due to reports of worsening shortness of breath since the night prior to the admission with associated wheezing and requiring NRM to keep O2 sats above 90%. Pt is not reliable historian due to dementia.   Pt initially seen by paramedic and O2 sats at that time in 80's but improved with NRM.  In ED, BP was 134/89, HR 78-115, RR 22-47. Oxygen saturation was 91-97% with [place] oxygen support. Blood work was notable for WBC 12.5, Hg 1.3, troponin 0.07. The 12 lead EKG showed atrial fibrillation. CXR showed CHF with superimposed pneumonia and left pleural effusion. She was started on azithromycin and rocephin on admission. She was admitted to telemetry unit.   Assessment/Plan:    Acute on chronic respiratory failure with hypoxia and hypercapnia  - appears to be be multifactorial and secondary to acute on chronic diastolic CHF, left pleural effusion, ? Bibasilar PNA, COPD - Respiratory status is stable this morning, patient maintaining oxygen saturations above 95% on 2 L  Oxygen [place] - Patient is status post left sided thoracentesis, 700 mL of fluid removed and sent for analysis, reports still pending - Repeat chest x-ray indicative of near resolution of left-sided pleural effusion - Continue antibiotics Zithromax and Rocephin day 2 as patient seemed to be responding well, afebrile, white blood cell coming down - Continue Lasix 40 mg IV, monitor weight trend, weight since admission: 181 --> 187 lbs this AM  Active Problems: Sepsis due to pneumonia,  bibasilar, unknown pathogen - Sepsis criteria met on admission with HR 115, RR up to 40s, lactic acid 3.8, WBC 12.5 - Sepsis order set placed on admission - Please note that consideration for HCAP given on admission but since pt did not appear toxic, Zithromax and Rocephin were thought to be appropriate initial antibiotics - Patient seemed to be responding well to these antibiotics, remains afebrile since admission, WBC trending down, lactic acid now within normal limits - Follow up blood culture results - If patient's clinical status deteriorates, we'll consider broadening antibiotics but for now continue Zithromax and Rocephin day 2  Left pleural effusion - Please note that patient underwent recent thoracentesis 09/15/2014, 1 L fluid removed - Repeat thoracentesis done 10/09/2014, 700 mL fluid removed and sent for analysis, report still pending  Acute on Chronic obstructive pulmonary disease exacerbation - Started on duoneb every 4 hours scheduled and every 4 hours PRN shortness of breath or wheezing. - Added low dose morphine to help with work of breathing - Continue oxygen support via [place] to keep O2 sat above 90% - No wheezing on exam this morning, no need for steroids at this time  Acute on chronic diastolic CHF - Recent 2-D echo March 2016 with preserved EF, no need for repeat 2-D echo - Patient with less crackles on exam this morning, unclear why weight is up, possibly due to differing calibration scales from emergency department to unit - Weight on admission 181 --> 187 lbs this morning - Continue Lasix 40 mg IV daily and monitor clinical - Strict I/O, daily weights  Chronic atrial fibrillation - CHADS  vasc score at least 3 - On anticoagulation with xarelto - Rate controlled with Cardizem.  - Continue digoxin  Mild troponin elevation - Likely duet o demand ischemia from CHF exacerbation - Troponin trend: 0.07 --> 0.06 --> 0.04 - No reports of chest pain and will therefore not  cycle CE's - 12 lead EKG showed atrial fibrillation   Obesity - Body mass index is 31.18  DVT prophylaxis:  - SCD's bilaterally, Xarelto  Code Status: DNR/DNI Family Communication: daughter at bedside  Disposition Plan: Still requiring IV Lasix and IV ABXm PCT consulted for [place]. When ready will return to [facility]   IV access:  Peripheral IV  Procedures and diagnostic studies:    CXR  10/10/2014  Near complete resolution of effusion on the left following thoracentesis. No pneumothorax. Small right effusion with right base consolidation.   CXR  09/16/2014   Decrease in size of the left-sided pleural effusion.  Small effusions and bibasilar atelectasis.  Stable vascular congestion  CXR  10/09/2014  Congestive heart failure. ? pneumonia in the bases. Effusion on the left is larger compared to recent prior study.     US Thoracentesis  09/15/2014   Successful ultrasound-guided left sided thoracentesis yielding 1 liter of pleural fluid.    Medical Consultants:  IR for thoracentesis   Other Consultants:  None   IAnti-Infectives:   Zithromax 4/27 -->  Rocephin 4/27 -->  Faye Ramsay, MD  [facility] Pager [phone]  If 7PM-7AM, please contact night-coverage [email or web address] Password [facility] 10/10/2014, 11:42 AM   LOS: 1 day   HPI/Subjective: No events overnight. Patient reports feeling better this morning with mild exertional dyspnea.  Objective: Filed Vitals:   10/10/14 0534 10/10/14 0754 10/10/14 1050 10/10/14 1105  BP: 134/57  117/59 138/56  Pulse: 67     Temp: 98.1 F (36.7 C)     TempSrc: Oral     Resp: 20     Height:      Weight: 85.004 kg (187 lb 6.4 oz)     SpO2: 97% 97%      Intake/Output Summary (Last 24 hours) at 10/10/14 1142 Last data filed at 10/10/14 0700  Gross per 24 hour  Intake    660 ml  Output      0 ml  Net    660 ml    Exam:   General:  Pt is alert, follows commands appropriately, not in acute distress  Cardiovascular: Irregular rate and  rhythm, no rubs, no gallops  Respiratory: Clear to auscultation bilaterally, no wheezing, mild crackles at bases   Abdomen: Soft, non tender, non distended, bowel sounds present, no guarding  Extremities: , trace bilateral LE pitting edema, pulses DP and PT palpable bilaterally  Neuro: Grossly nonfocal  Data Reviewed: Basic Metabolic Panel:  Recent Labs Lab 10/09/14 1405 10/09/14 1753 10/10/14 0446  NA 140 139 137  K 4.3 4.4 5.1  CL 93* 96 93*  CO2 33* 34* 35*  GLUCOSE 149* 205* 169*  BUN 22 21 27*  CREATININE 0.65 0.76 0.64  CALCIUM 9.0 8.6 8.7  MG  --  2.2  --    Liver Function Tests:  Recent Labs Lab 10/09/14 1753  AST 27  ALT 23  ALKPHOS 81  BILITOT 0.8  PROT 7.0  ALBUMIN 2.9*   CBC:  Recent Labs Lab 10/09/14 1405 10/09/14 1753  WBC 12.5* 11.4*  NEUTROABS 10.2* 10.7*  HGB 11.3* 11.1*  HCT 37.3 36.6  MCV 98.4 99.7  PLT 253 266  Cardiac Enzymes:  Recent Labs Lab 10/09/14 1405 10/09/14 1753 10/09/14 2215 10/10/14 0446  TROPONINI 0.07* 0.06* 0.05* 0.04*    Recent Results (from the past 240 hour(s))  Culture, blood (routine x 2) Call MD if unable to obtain prior to antibiotics being given     Status: None (Preliminary result)   Collection Time: 10/09/14  3:50 PM  Result Value Ref Range Status   Specimen Description BLOOD RIGHT ANTECUBITAL  Final   Special Requests BOTTLES DRAWN AEROBIC AND ANAEROBIC 5CC EACH  Final   Culture   Final           BLOOD CULTURE RECEIVED NO GROWTH TO DATE CULTURE WILL BE HELD FOR 5 DAYS BEFORE ISSUING A FINAL NEGATIVE REPORT Performed at [facility]    Report Status PENDING  Incomplete  Culture, blood (routine x 2) Call MD if unable to obtain prior to antibiotics being given     Status: None (Preliminary result)   Collection Time: 10/09/14  3:50 PM  Result Value Ref Range Status   Specimen Description BLOOD RIGHT ARM  Final   Special Requests BOTTLES DRAWN AEROBIC AND ANAEROBIC 5CC EACH  Final    Culture   Final           BLOOD CULTURE RECEIVED NO GROWTH TO DATE CULTURE WILL BE HELD FOR 5 DAYS BEFORE ISSUING A FINAL NEGATIVE REPORT Performed at [facility]    Report Status PENDING  Incomplete     Scheduled Meds: . azithromycin  500 mg Intravenous Q24H  . cefTRIAXone  IV  1 g Intravenous Q24H  . digoxin  0.125 mg Oral QODAY  . digoxin  0.25 mg Oral QODAY  . diltiazem  30 mg Oral Q12H  . docusate sodium  100 mg Oral Daily  . furosemide  40 mg Intravenous Daily  . ipratropium-albuterol  3 mL Nebulization Q4H  . potassium chloride SA  20 mEq Oral BID  . rivaroxaban  20 mg Oral Daily   Continuous Infusions:

## 2014-10-10 NOTE — Clinical Social Work Note (Signed)
Clinical Social Work Assessment  Patient Details  Name: Jennifer Nielsen MRN: [id number] Date of Birth: March 28, 1925  Date of referral:  10/10/14               Reason for consult:  Facility Placement                Permission sought to share information with:  Facility Art therapist granted to share information::  Yes, Verbal Permission Granted  Name::        Agency::     Relationship::     Contact Information:     Housing/Transportation Living arrangements for the past 2 months:  [place] of Information:  Patient, Adult Children, Facility Patient Interpreter Needed:  None Criminal Activity/Legal Involvement Pertinent to Current Situation/Hospitalization:    Significant Relationships:  Adult Children Lives with:  Facility Resident Do you feel safe going back to the place where you live?  Yes Need for family participation in patient care:  No (Coment)  Care giving concerns:  CSW received consult that patient was admitted from [facility].    Social Worker assessment / plan:  CSW confirmed with patient & daughter, Jennifer Nielsen at bedside that patient plans to return to [facility] when stable for discharge.   Employment status:  Retired Forensic scientist:  Medicare PT Recommendations:  [place] / Referral to community resources:  [place]  Patient/Family's Response to care:  Patient & daughter stated that they were very pleased with the care that she receives at [facility] and that she is there for long term care.   Patient/Family's Understanding of and Emotional Response to Diagnosis, Current Treatment, and Prognosis:  Patient's daughter informed CSW that recently, patient has been back and forth between the hospital and [facility] which is tiring but wants to make sure that she is completely stable before returning this time.   Emotional Assessment Appearance:  Appears stated  age Attitude/Demeanor/Rapport:    Affect (typically observed):  Calm, Pleasant, Accepting Orientation:  Oriented to Self, Oriented to Place, Oriented to  Time, Oriented to Situation Alcohol / Substance use:    Psych involvement (Current and /or in the community):  No (Comment)  Discharge Needs  Concerns to be addressed:    Readmission within the last 30 days:    Current discharge risk:    Barriers to Discharge:      Standley Brooking, LCSW 10/10/2014, 2:24 PM

## 2014-10-10 NOTE — Progress Notes (Signed)
Called pt daughter, [name] [phone], to get consent for the pt, Jennifer Nielsen, to have a thoracentesis. Daughter gave consent. Greg Cutter RN was the second witness for the consent.

## 2014-10-10 NOTE — Progress Notes (Signed)
Patient XM:IWOEHOZ Frie      DOB: 05-Mar-1925      YYQ:[id number]   Palliative Medicine Team at [facility] Progress Note    Subjective: Breathing feels a lot better. Some mild confusion apparent. Ate very well. No pain. No n/v.    Filed Vitals:   10/10/14 1105  BP: 138/56  Pulse:   Temp:   Resp:    Physical exam: GEN: alert, NAD HEENT: [place], sclera anicteric CV: RRR LUNGS: CTAB ABD: soft, ND EXT: warm  CBC    Component Value Date/Time   WBC 11.4* 10/09/2014 1753   RBC 3.67* 10/09/2014 1753   HGB 11.1* 10/09/2014 1753   HCT 36.6 10/09/2014 1753   PLT 266 10/09/2014 1753   MCV 99.7 10/09/2014 1753   MCH 30.2 10/09/2014 1753   MCHC 30.3 10/09/2014 1753   RDW 17.9* 10/09/2014 1753   LYMPHSABS 0.5* 10/09/2014 1753   MONOABS 0.2 10/09/2014 1753   EOSABS 0.0 10/09/2014 1753   BASOSABS 0.0 10/09/2014 1753    CMP     Component Value Date/Time   NA 137 10/10/2014 0446   NA 134* 02/25/2014   K 5.1 10/10/2014 0446   CL 93* 10/10/2014 0446   CO2 35* 10/10/2014 0446   GLUCOSE 169* 10/10/2014 0446   BUN 27* 10/10/2014 0446   BUN 20 02/25/2014   CREATININE 0.64 10/10/2014 0446   CREATININE 0.6 02/25/2014   CREATININE 0.80 02/22/2014 1214   CALCIUM 8.7 10/10/2014 0446   PROT 7.0 10/09/2014 1753   ALBUMIN 2.9* 10/09/2014 1753   AST 27 10/09/2014 1753   ALT 23 10/09/2014 1753   ALKPHOS 81 10/09/2014 1753   BILITOT 0.8 10/09/2014 1753   GFRNONAA 77* 10/10/2014 0446   GFRAA 90* 10/10/2014 0446     Assessment and plan: 81 yo female with COPD, Afib, HFpEF admitted with acute hypoxic/hypercarbic resp failure.   1. Code Status: DNR  2. GOC: See initial consult note. Kyndel continues to have mild confusion. Thought thoracentesis went well. Also told me she "drained fluid off my lung myself".  Breathing looks better.  Spoke with daughter [name] extensively today. Pleural fluid appears more transudative by total protein criteria.  With her history, perhaps this is more  related to CHF but she really doesn't have markedly elevated BNP. I think question going forward will be how quickly does this fluid come back? Can it be kept off with diuretics?  Do we try a pleurex cath if reoccurs in few weeks again? I talked with daughter again about if this keeps be repetitive issue, prognostically will be worrisome for her.  She does not bounce back to baseline with each hospitalization, pleurex poses some infectious risk along with having her list of comorbid diseases.  We discussed based on how she does, if she continues to have repeat setbacks, that we may need to talk about hospice care more extensively. This was all difficult for [name] to talk about today, even though she had some acknowledgement yesterday that things have not been going well. She will talk with her sister tonight and I will touch base with them tomorrow. I think ultimate goals of care will not significantly change during this hospital stay and will be more dependent on how she does after leaving here.      3. Symptom Management:  1. Dyspnea- improved significantly with thoracentesis.  Ongoing diuresis. Low dose opioids PRN.   4. Psychosocial/Spiritual: Widowed, 2 daughters. 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    Total Time: 25 minutes Greater than 50% of this time was spent counseling and coordinating care related to the above assessment and plan.  Doran Clay D.O. Palliative Medicine Team at [facility]  Pager: [phone] Team Phone: [phone]

## 2014-10-11 ENCOUNTER — Inpatient Hospital Stay (HOSPITAL_COMMUNITY): Payer: Medicare Other

## 2014-10-11 LAB — CBC
HEMATOCRIT: 31.6 % — AB (ref 36.0–46.0)
Hemoglobin: 9.7 g/dL — ABNORMAL LOW (ref 12.0–15.0)
MCH: 29.9 pg (ref 26.0–34.0)
MCHC: 30.7 g/dL (ref 30.0–36.0)
MCV: 97.5 fL (ref 78.0–100.0)
Platelets: 266 10*3/uL (ref 150–400)
RBC: 3.24 MIL/uL — ABNORMAL LOW (ref 3.87–5.11)
RDW: 17.5 % — AB (ref 11.5–15.5)
WBC: 10.3 10*3/uL (ref 4.0–10.5)

## 2014-10-11 LAB — BASIC METABOLIC PANEL
ANION GAP: 6 (ref 5–15)
Anion gap: 9 (ref 5–15)
BUN: 26 mg/dL — AB (ref 6–23)
BUN: 31 mg/dL — ABNORMAL HIGH (ref 6–23)
CHLORIDE: 92 mmol/L — AB (ref 96–112)
CO2: 36 mmol/L — ABNORMAL HIGH (ref 19–32)
CO2: 37 mmol/L — ABNORMAL HIGH (ref 19–32)
CREATININE: 0.56 mg/dL (ref 0.50–1.10)
CREATININE: 0.59 mg/dL (ref 0.50–1.10)
Calcium: 8.8 mg/dL (ref 8.4–10.5)
Calcium: 9 mg/dL (ref 8.4–10.5)
Chloride: 94 mmol/L — ABNORMAL LOW (ref 96–112)
GFR calc Af Amer: >90 mL/min (ref >90)
GFR calc Af Amer: >90 mL/min (ref >90)
GFR calc non Af Amer: 81 mL/min — ABNORMAL LOW (ref >90)
GFR, EST NON AFRICAN AMERICAN: 79 mL/min — AB (ref >90)
Glucose, Bld: 149 mg/dL — ABNORMAL HIGH (ref 70–99)
Glucose, Bld: 183 mg/dL — ABNORMAL HIGH (ref 70–99)
POTASSIUM: 5.3 mmol/L — AB (ref 3.5–5.1)
Potassium: 4.9 mmol/L (ref 3.5–5.1)
SODIUM: 139 mmol/L (ref 135–145)
SODIUM: 135 mmol/L (ref 135–145)

## 2014-10-11 LAB — BLOOD GAS, ARTERIAL
Acid-Base Excess: 10.3 mmol/L — ABNORMAL HIGH (ref 0.0–2.0)
Bicarbonate: 36.8 mEq/L — ABNORMAL HIGH (ref 20.0–24.0)
Drawn by: 11249
O2 Content: 2 L/min
O2 Saturation: 99.1 %
PCO2 ART: 59.5 mmHg — AB (ref 35.0–45.0)
Patient temperature: 98.5
TCO2: 33 mmol/L (ref 0–100)
pH, Arterial: 7.407 (ref 7.350–7.450)
pO2, Arterial: 153 mmHg — ABNORMAL HIGH (ref 80.0–100.0)

## 2014-10-11 NOTE — Progress Notes (Signed)
unstageable heel wound

## 2014-10-11 NOTE — Evaluation (Signed)
Physical Therapy Evaluation Patient Details Name: Jennifer Nielsen MRN: [id number] DOB: 1924-11-08 Today's Date: 10/11/2014   History of Present Illness  81 year old female with past medical history of COPD, dementia, atrial fibrillation on anticoagulation with xarelto, mild chronic diastolic CHF (last 2 D ECHO in 08/2014 with preserved EF), recent hospitalization for COPD exacerbation who presented from [facility] place due to reports of worsening shortness of breath.  Dx of PNA, sepsis, acute respiratory failure. S/P thoracentesis 10/09/14.   Clinical Impression  Pt admitted with above diagnosis. Pt currently with functional limitations due to the deficits listed below (see PT Problem List). +2 for supine to sit, pt able to sit on EOB without support x 10 min, SaO2 dropped on RA to 85%, 97% on 2L O2 [place]. Attempted using mechanical lift, however pt unable to come to stand with it, so performed lateral scoot using pad with +3 assist.  Pt will benefit from skilled PT to increase their independence and safety with mobility to allow discharge to the venue listed below.       Follow Up Recommendations SNF    Equipment Recommendations  None recommended by PT    Recommendations for Other Services       Precautions / Restrictions Precautions Precautions: Fall Precaution Comments: monitor O2 Restrictions Weight Bearing Restrictions: No      Mobility  Bed Mobility Overal bed mobility: Needs Assistance Bed Mobility: Rolling;Sidelying to Sit Rolling: +2 for physical assistance;Total assist Sidelying to sit: +2 for physical assistance;Total assist       General bed mobility comments: +2 to roll and to elevate trunk, pt 30%  Transfers                 General transfer comment: attempted Clarise Cruz Plus, however pt lacked enough UE strength to assist with powering up and reported knee pain with attempted stand; performed lateral scoot transfer with arm of recliner dropped and with +3 assist using  pad, pt 20%; pt dyspnic with sitting on EOB, SaO2 dropped to 85% on RA while sitting, 97% on 2L O2  Ambulation/Gait                Stairs            Wheelchair Mobility    Modified Rankin (Stroke Patients Only)       Balance Overall balance assessment: Needs assistance Sitting-balance support: Feet supported;Single extremity supported Sitting balance-Leahy Scale: Fair Sitting balance - Comments: pt able to sit unsupported for 10 minutes, slouched posture                                     Pertinent Vitals/Pain Pain Assessment: No/denies pain    Home Living Family/patient expects to be discharged to:: Skilled nursing facility                      Prior Function Level of [place]: Needs assistance   Gait / Transfers Assistance Needed: nonambulatory     Comments: patient states  2 person assist OOB, [place]     Hand Dominance        Extremity/Trunk Assessment   Upper Extremity Assessment: Generalized weakness           Lower Extremity Assessment: Overall WFL for tasks assessed (quads 4/5)      Cervical / Trunk Assessment: Kyphotic  Communication   Communication: No difficulties  Cognition Arousal/Alertness: Awake/alert Behavior  During Therapy: WFL for tasks assessed/performed Overall Cognitive Status: Within Functional Limits for tasks assessed                      General Comments      Exercises        Assessment/Plan    PT Assessment Patient needs continued PT services  PT Diagnosis Generalized weakness   PT Problem List Decreased strength;Decreased activity tolerance;Decreased mobility  PT Treatment Interventions Functional mobility training;Therapeutic activities;Therapeutic exercise;Balance training   PT Goals (Current goals can be found in the Care Plan section) Acute Rehab PT Goals Patient Stated Goal: to be able to sit up PT Goal Formulation: With patient/family Time For Goal  Achievement: 10/25/14 Potential to Achieve Goals: Fair    Frequency Min 2X/week   Barriers to discharge        Co-evaluation               End of Session   Activity Tolerance: Patient limited by fatigue Patient left: in chair;with call bell/phone within reach;with family/visitor present Nurse Communication: Mobility status (instructed NT to use pad for lateral scoot back to bed)         Time: 1420-1501 PT Time Calculation (min) (ACUTE ONLY): 41 min   Charges:   PT Evaluation $Initial PT Evaluation Tier I: 1 Procedure PT Treatments $Therapeutic Activity: 38-52 mins   PT G Codes:        Philomena Doheny 10/11/2014, 3:17 PM [phone]

## 2014-10-11 NOTE — Progress Notes (Signed)
Initial Nutrition Assessment  DOCUMENTATION CODES:  Obesity unspecified  INTERVENTION:  Other (Comment) (Mighty Shake once a day)  NUTRITION DIAGNOSIS:  Other (Comment) (Decreased fluid needs) related to other (see comment) (medical hx, fluid retention) as evidenced by estimated needs.   GOAL:  Patient will meet greater than or equal to 90% of their needs   MONITOR:  PO intake, Supplement acceptance, I & O's, Labs, Weight trends  REASON FOR ASSESSMENT:  Consult    ASSESSMENT:  Pt seen for consult for assessment of nutrition requirements and status. BMI indicates obesity. Pt noted to have dementia and be a poor historian. The majority of information provided by pt's daughter at bedside.   Pt lives at [facility] and she had a good appetite there and continues with good appetite here; 75-100% intakes documented for meals since admission. At facility, pt's weight is taken often because of Lasix order. Daughter feels that pt has been losing weight but she is unsure of pt's UBW. Per chart review, pt has lost 18 lbs (9% body weight) in almost 5 months.  Pt was drinking Mighty Shake at facility and daughter reports this regimen began about a month ago. Pt interested in receiving supplement here; will order for once/day.  Meeting needs. Labs and medications reviewed. Physical assessment does not show muscle or fat wasting.  Height:  Ht Readings from Last 1 Encounters:  10/11/14 5\' 4"  (1.626 m)    Weight:  Wt Readings from Last 1 Encounters:  10/11/14 186 lb 4.6 oz (84.5 kg)    Ideal Body Weight:  120 kg (lbs)  Wt Readings from Last 10 Encounters:  10/11/14 186 lb 4.6 oz (84.5 kg)  09/20/14 188 lb (85.276 kg)  09/19/14 190 lb (86.183 kg)  09/17/14 185 lb (83.915 kg)  08/20/14 186 lb 3.2 oz (84.46 kg)  06/18/14 204 lb 3.2 oz (92.625 kg)  04/18/14 200 lb 6.4 oz (90.901 kg)  02/05/14 208 lb (94.348 kg)  01/29/14 208 lb 6.4 oz (94.53 kg)  12/26/13 205 lb 12.8 oz  (93.35 kg)    BMI:  Body mass index is 31.96 kg/(m^2).  Estimated Nutritional Needs:  Kcal:  1400-1600  Protein:  70-90 grams  Fluid:  1.2-1.5L/day  Skin:  Wound (see comment)  Diet Order:  Diet regular Room service appropriate?: Yes; Fluid consistency:: Thin  EDUCATION NEEDS:  No education needs identified at this time   Intake/Output Summary (Last 24 hours) at 10/11/14 1318 Last data filed at 10/11/14 1227  Gross per 24 hour  Intake    840 ml  Output      0 ml  Net    840 ml    Last BM:  4/28     Jarome Matin, RD, LDN Inpatient Clinical Dietitian Pager # [phone] After hours/weekend pager # [phone]

## 2014-10-11 NOTE — Progress Notes (Signed)
Patient YQ:[id number] [place]      DOB: 1925/06/10      ZDG:[id number]   Palliative Medicine Team at [facility] Progress Note    Subjective: States she feels tired this morning and did not sleep well overnight. Can't recall a lot of the events noted overnight. Denies Dyspnea. Has some cramping right lower ext pain over anterior shin.  No nausea/vomiting.    Filed Vitals:   10/11/14 0434  BP: 119/48  Pulse: 66  Temp: 98.2 F (36.8 C)  Resp: 20   Physical exam: GEN: alert, NAD HEENT: [place], sclera anicteric CV: RRR LUNGS: CTAB ABD: soft, ND EXT: warm  CBC    Component Value Date/Time   WBC 10.3 10/11/2014 0000   RBC 3.24* 10/11/2014 0000   HGB 9.7* 10/11/2014 0000   HCT 31.6* 10/11/2014 0000   PLT 266 10/11/2014 0000   MCV 97.5 10/11/2014 0000   MCH 29.9 10/11/2014 0000   MCHC 30.7 10/11/2014 0000   RDW 17.5* 10/11/2014 0000   LYMPHSABS 0.5* 10/09/2014 1753   MONOABS 0.2 10/09/2014 1753   EOSABS 0.0 10/09/2014 1753   BASOSABS 0.0 10/09/2014 1753    CMP     Component Value Date/Time   NA 135 10/11/2014 0000   NA 134* 02/25/2014   K 5.3* 10/11/2014 0000   CL 92* 10/11/2014 0000   CO2 37* 10/11/2014 0000   GLUCOSE 183* 10/11/2014 0000   BUN 31* 10/11/2014 0000   BUN 20 02/25/2014   CREATININE 0.59 10/11/2014 0000   CREATININE 0.6 02/25/2014   CREATININE 0.80 02/22/2014 1214   CALCIUM 8.8 10/11/2014 0000   PROT 7.0 10/09/2014 1753   ALBUMIN 2.9* 10/09/2014 1753   AST 27 10/09/2014 1753   ALT 23 10/09/2014 1753   ALKPHOS 81 10/09/2014 1753   BILITOT 0.8 10/09/2014 1753   GFRNONAA 79* 10/11/2014 0000   GFRAA >90 10/11/2014 0000    ABG    Component Value Date/Time   PHART 7.407 10/11/2014 0126   PCO2ART 59.5* 10/11/2014 0126   PO2ART 153* 10/11/2014 0126   HCO3 36.8* 10/11/2014 0126   TCO2 33.0 10/11/2014 0126   O2SAT 99.1 10/11/2014 0126     Assessment and plan: 81 yo female with COPD, Afib, HFpEF admitted with acute hypoxic/hypercarbic resp failure.    1. Code Status: DNR  2. GOC: See prior documentation.  Events overnight noted. Appears to have chronic CO2 retention with well compensated pH. Spoke some with [name] today and her sister [name] yesterday. I think they are surprised at how she has been doing.  Conversations we have had related to possibly not doing well from here on out have been difficult for them despite their own acknowledgement that it has been worrisome with her frequent hospitalizations.  I think the multifactorial nature of her health issues also likely difficult to understand (not one thing driving her decline, not one easy fix).  She may have additional undiagnosed respiratory issues such as OSA and/or OHS as well.  She is going to get out of bed today via lift.  Will have to see how she does. I am here over weekend covering consult service and can be reached for any urgent needs. Will otherwise plan on follow-up Monday.    3. Symptom Management:   1. Dyspnea- still appears somewhat tenuous today. A little more labored again today after improvement with thoracentesis. Many comorbid conditions likely effecting this and possibly undiagnosed OSA or OHS.  Has not used low dose opiods 2. Somnolence- more  awake this morning again. Events overnight noted.  May have resp issues as above or sundowning as well with hypoactive delirium.    4. 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    Total Time: 25 minutes Greater than 50% of this time was spent counseling and coordinating care related to the above assessment and plan.  Doran Clay D.O. Palliative Medicine Team at [facility]  Pager: [phone] Team Phone: [phone]

## 2014-10-11 NOTE — Progress Notes (Signed)
Jennifer Nielsen [id number] [birth year]   Brief history 81 yo female with chronic respiratory failure in setting of COPD, chronic diastolic HF, afib and dementia admitted on 4/27 with sepsis due to pneumonia. She had a left pleural effusion requiring thoracentesis on 4/3 and again 4/27 (700cc removed). She is currently on Azith/Rocephin. Palliative care has is following to help establish goals of care. Called to floor this evening as RN feels pt is not as alert as on previous shift.  Subjective Difficult to obtain given dementia. Pt denies any shortness of breath or chest pain.  Objective Filed Vitals:   10/11/14 0056  BP: 121/53  Pulse:   Temp:   Resp:    CBC    Component Value Date/Time   WBC 10.3 10/11/2014 0000   RBC 3.24* 10/11/2014 0000   HGB 9.7* 10/11/2014 0000   HCT 31.6* 10/11/2014 0000   PLT 266 10/11/2014 0000   MCV 97.5 10/11/2014 0000   MCH 29.9 10/11/2014 0000   MCHC 30.7 10/11/2014 0000   RDW 17.5* 10/11/2014 0000   LYMPHSABS 0.5* 10/09/2014 1753   MONOABS 0.2 10/09/2014 1753   EOSABS 0.0 10/09/2014 1753   BASOSABS 0.0 10/09/2014 1753    BMET    Component Value Date/Time   NA 135 10/11/2014 0000   NA 134* 02/25/2014   K 5.3* 10/11/2014 0000   CL 92* 10/11/2014 0000   CO2 37* 10/11/2014 0000   GLUCOSE 183* 10/11/2014 0000   BUN 31* 10/11/2014 0000   BUN 20 02/25/2014   CREATININE 0.59 10/11/2014 0000   CREATININE 0.6 02/25/2014   CREATININE 0.80 02/22/2014 1214   CALCIUM 8.8 10/11/2014 0000   GFRNONAA 79* 10/11/2014 0000   GFRAA >90 10/11/2014 0000    General: easily awakened, but somnolent CV: S1S2 RRR, no LE edema Resp: decreased effort, but CTAB Neuro: no focal deficits on exam Ext: no c/c/e  Assessment/Plan:  -Acute on chronic respiratory failure Multifactorial in nature given pna, COPD, pleural effusion +/- chf exacerbation on admit She does seem somnolent on exam, but not encephalopathic. She awakens and answers questions  appropriately CBC, BMET wnl. ABG consistent with chronic CO2 retention.  Will continue to monitor closely to determine need for bipap.   Findings and plan of care discussed with Dr. Jacquelyne Balint, NP-C [facility] [place]  pgr [phone]

## 2014-10-11 NOTE — Progress Notes (Signed)
Patient ID: Jennifer Nielsen, female   DOB: December 25, 1924, 81 y.o.   MRN: [id number]  [facility] PROGRESS NOTE  Jiali Linney ENM:[id number] DOB: 22-May-1925 DOA: 10/09/2014 PCP: [name], MD   Brief narrative:    81 year old female with COPD, dementia, atrial fibrillation on anticoagulation with xarelto, mild chronic diastolic CHF (last 2 D ECHO in 08/2014 with preserved EF), recent hospitalization for COPD exacerbation who presented from [facility] place due to reports of worsening shortness of breath since the night prior to the admission with associated wheezing and requiring NRM to keep O2 sats above 90%. Pt is not reliable historian due to dementia.   Pt initially seen by paramedic and O2 sats at that time in 80's but improved with NRM.  In ED, BP was 134/89, HR 78-115, RR 22-47. Oxygen saturation was 91-97% with [place] oxygen support. Blood work was notable for WBC 12.5, Hg 1.3, troponin 0.07. The 12 lead EKG showed atrial fibrillation. CXR showed CHF with superimposed pneumonia and left pleural effusion. She was started on azithromycin and rocephin on admission. She was admitted to telemetry unit.   Assessment/Plan:    Acute on chronic respiratory failure with hypoxia and hypercapnia  - appears to be be multifactorial and secondary to acute on chronic diastolic CHF, left pleural effusion, ? Bibasilar PNA, acute COPD - pt had more dyspnea overnight and this AM requiring rapid response to come to bedside night of 4/28 - pt is more stable this AM but will need to repeat CXR this AM to make sure no worsening effusion  - Patient is status post left sided thoracentesis 4/27, 700 mL of fluid removed and sent for analysis, reports still pending - Repeat chest x-ray indicative of near resolution of left-sided pleural effusion 4/28, will need repeat CXR this Am 4/29 as noted above  - Continue antibiotics Zithromax and Rocephin day 3 as patient seems to be responding well, afebrile, white blood cell now  WNL - Continue Lasix 40 mg IV, monitor weight trend, weight since admission: 181 --> 187 --> 186 lbs this AM  Active Problems: Acute encephalopathy night of 4/28 - requiring rapid response team to arrive at the bedside, pt noted to be somnolent but hemodynamically stable with mild respiratory distress - pt is stable this AM but appears to be more dyspneic compared to yesterday - will ask for CXR this AM - attempt PT evaluation, OOB as pt able to tolerate   Sepsis due to pneumonia, bibasilar, unknown pathogen - Sepsis criteria met on admission with HR 115, RR up to 40s, lactic acid 3.8, WBC 12.5 - Sepsis order set placed on admission - Please note that consideration for HCAP given on admission but since pt did not appear toxic, Zithromax and Rocephin were thought to be appropriate initial antibiotics - Patient seems to be responding well to these antibiotics, remains afebrile since admission, WBC trending down and WNL, lactic acid now within normal limits - Follow up blood culture results - continue Zithromax and Rocephin day 3  Hyperkalemia - stop potassium supplementation, repeat BMP indicated K is now WNL  Left pleural effusion - Please note that patient underwent recent thoracentesis 09/15/2014, 1 L fluid removed - Repeat thoracentesis done 10/09/2014, 700 mL fluid removed and sent for analysis, report still pending  Acute on Chronic obstructive pulmonary disease exacerbation - Started on duoneb every 4 hours scheduled and every 4 hours PRN shortness of breath or wheezing. - Added low dose morphine to help with work of breathing -  Continue oxygen support via [place] to keep O2 sat above 90% - Since no wheezing on exam this morning, no need for steroids at this time  Acute on chronic diastolic CHF - Recent 2-D echo March 2016 with preserved EF, no need for repeat 2-D echo - Patient with mild crackles on exam this morning - Weight on admission 181 --> 187 --> 186 lbs this morning -  Continue Lasix 40 mg IV daily and monitor clinical - Strict I/O, daily weights  Chronic atrial fibrillation - CHADS vasc score at least 3 - On anticoagulation with xarelto - Rate controlled with Cardizem.  - Continue digoxin  Mild troponin elevation - Likely duet o demand ischemia from CHF exacerbation - Troponin trend: 0.07 --> 0.06 --> 0.04 - No reports of chest pain and will therefore not cycle CE's - 12 lead EKG showed atrial fibrillation   Anemia of chronic disease, IDA - drop in Hg since admission - no signs of active bleeding - monitor closely and repeat CBC In AM  Obesity - Body mass index is 31.18  DVT prophylaxis:  - SCD's bilaterally, Xarelto  Code Status: DNR/DNI Family Communication: daughter at bedside  Disposition Plan: Still requiring IV Lasix and IV ABX. When ready will return to [facility] place   IV access:  Peripheral IV  Procedures and diagnostic studies:    CXR  10/10/2014  Near complete resolution of effusion on the left following thoracentesis. No pneumothorax. Small right effusion with right base consolidation.   CXR  09/16/2014   Decrease in size of the left-sided pleural effusion.  Small effusions and bibasilar atelectasis.  Stable vascular congestion  CXR  10/09/2014  Congestive heart failure. ? pneumonia in the bases. Effusion on the left is larger compared to recent prior study.     US Thoracentesis  09/15/2014   Successful ultrasound-guided left sided thoracentesis yielding 1 liter of pleural fluid.    Medical Consultants:  IR for thoracentesis   Other Consultants:  None   IAnti-Infectives:   Zithromax 4/27 -->  Rocephin 4/27 -->  Faye Ramsay, MD  [facility] Pager [phone]  If 7PM-7AM, please contact night-coverage [email or web address] Password [email or web address] 10/11/2014, 4:00 PM   LOS: 2 days   HPI/Subjective: Pt more somnolent overnight requiring rapid response to arrive at bedside. Pt more alert this AM and denies chest pain.   Objective: Filed  Vitals:   10/11/14 0800 10/11/14 1111 10/11/14 1312 10/11/14 1505  BP:      Pulse:  84    Temp:      TempSrc:      Resp:      Height:   '5\' 4"'$  (1.626 m)   Weight:   84.5 kg (186 lb 4.6 oz)   SpO2: 98%   85%    Intake/Output Summary (Last 24 hours) at 10/11/14 1600 Last data filed at 10/11/14 1227  Gross per 24 hour  Intake    840 ml  Output      0 ml  Net    840 ml    Exam:   General:  Pt is alert, follows commands appropriately, not in acute distress  Cardiovascular: Irregular rate and rhythm, no rubs, no gallops  Respiratory: Crackles at bases with diminished breath sounds bilaterally   Abdomen: Soft, non tender, non distended, bowel sounds present, no guarding  Extremities: , trace bilateral LE pitting edema, pulses DP and PT palpable bilaterally  Neuro: Grossly nonfocal  Data Reviewed: Basic Metabolic Panel:  Recent Labs Lab 10/09/14 1405 10/09/14  1753 10/10/14 0446 10/11/14 10/11/14 1120  NA 140 139 137 135 139  K 4.3 4.4 5.1 5.3* 4.9  CL 93* 96 93* 92* 94*  CO2 33* 34* 35* 37* 36*  GLUCOSE 149* 205* 169* 183* 149*  BUN 22 21 27* 31* 26*  CREATININE 0.65 0.76 0.64 0.59 0.56  CALCIUM 9.0 8.6 8.7 8.8 9.0  MG  --  2.2  --   --   --    Liver Function Tests:  Recent Labs Lab 10/09/14 1753  AST 27  ALT 23  ALKPHOS 81  BILITOT 0.8  PROT 7.0  ALBUMIN 2.9*   CBC:  Recent Labs Lab 10/09/14 1405 10/09/14 1753 10/11/14  WBC 12.5* 11.4* 10.3  NEUTROABS 10.2* 10.7*  --   HGB 11.3* 11.1* 9.7*  HCT 37.3 36.6 31.6*  MCV 98.4 99.7 97.5  PLT 253 266 266   Cardiac Enzymes:  Recent Labs Lab 10/09/14 1405 10/09/14 1753 10/09/14 2215 10/10/14 0446  TROPONINI 0.07* 0.06* 0.05* 0.04*    Recent Results (from the past 240 hour(s))  Culture, blood (routine x 2) Call MD if unable to obtain prior to antibiotics being given     Status: None (Preliminary result)   Collection Time: 10/09/14  3:50 PM  Result Value Ref Range Status   Specimen Description  BLOOD RIGHT ANTECUBITAL  Final   Special Requests BOTTLES DRAWN AEROBIC AND ANAEROBIC 5CC EACH  Final   Culture   Final           BLOOD CULTURE RECEIVED NO GROWTH TO DATE CULTURE WILL BE HELD FOR 5 DAYS BEFORE ISSUING A FINAL NEGATIVE REPORT Performed at [facility]    Report Status PENDING  Incomplete  Culture, blood (routine x 2) Call MD if unable to obtain prior to antibiotics being given     Status: None (Preliminary result)   Collection Time: 10/09/14  3:50 PM  Result Value Ref Range Status   Specimen Description BLOOD RIGHT ARM  Final   Special Requests BOTTLES DRAWN AEROBIC AND ANAEROBIC 5CC EACH  Final   Culture   Final           BLOOD CULTURE RECEIVED NO GROWTH TO DATE CULTURE WILL BE HELD FOR 5 DAYS BEFORE ISSUING A FINAL NEGATIVE REPORT Performed at [facility]    Report Status PENDING  Incomplete  Body fluid culture     Status: None (Preliminary result)   Collection Time: 10/10/14 11:21 AM  Result Value Ref Range Status   Specimen Description PLEURAL  Final   Special Requests NONE  Final   Gram Stain   Final    NO WBC SEEN NO ORGANISMS SEEN Performed at [facility]    Culture   Final    NO GROWTH 1 DAY Performed at [facility]    Report Status PENDING  Incomplete     Scheduled Meds: . azithromycin  500 mg Intravenous Q24H  . cefTRIAXone  IV  1 g Intravenous Q24H  . digoxin  0.125 mg Oral QODAY  . digoxin  0.25 mg Oral QODAY  . diltiazem  30 mg Oral Q12H  . docusate sodium  100 mg Oral Daily  . furosemide  40 mg Intravenous Daily  . ipratropium-albuterol  3 mL Nebulization Q4H  . potassium chloride SA  20 mEq Oral BID  . rivaroxaban  20 mg Oral Daily   Continuous Infusions:

## 2014-10-12 ENCOUNTER — Inpatient Hospital Stay (HOSPITAL_COMMUNITY): Payer: Medicare Other

## 2014-10-12 DIAGNOSIS — M6281 Muscle weakness (generalized): Secondary | ICD-10-CM | POA: Diagnosis not present

## 2014-10-12 DIAGNOSIS — R5381 Other malaise: Secondary | ICD-10-CM | POA: Diagnosis not present

## 2014-10-12 DIAGNOSIS — F29 Unspecified psychosis not due to a substance or known physiological condition: Secondary | ICD-10-CM | POA: Diagnosis not present

## 2014-10-12 DIAGNOSIS — I5032 Chronic diastolic (congestive) heart failure: Secondary | ICD-10-CM | POA: Diagnosis not present

## 2014-10-12 DIAGNOSIS — J9601 Acute respiratory failure with hypoxia: Secondary | ICD-10-CM | POA: Diagnosis not present

## 2014-10-12 DIAGNOSIS — J441 Chronic obstructive pulmonary disease with (acute) exacerbation: Secondary | ICD-10-CM | POA: Diagnosis not present

## 2014-10-12 DIAGNOSIS — D638 Anemia in other chronic diseases classified elsewhere: Secondary | ICD-10-CM | POA: Diagnosis not present

## 2014-10-12 DIAGNOSIS — I482 Chronic atrial fibrillation: Secondary | ICD-10-CM | POA: Diagnosis not present

## 2014-10-12 DIAGNOSIS — I517 Cardiomegaly: Secondary | ICD-10-CM | POA: Diagnosis not present

## 2014-10-12 DIAGNOSIS — Z5189 Encounter for other specified aftercare: Secondary | ICD-10-CM | POA: Diagnosis not present

## 2014-10-12 DIAGNOSIS — A419 Sepsis, unspecified organism: Secondary | ICD-10-CM | POA: Diagnosis not present

## 2014-10-12 DIAGNOSIS — J432 Centrilobular emphysema: Secondary | ICD-10-CM | POA: Diagnosis not present

## 2014-10-12 DIAGNOSIS — I4891 Unspecified atrial fibrillation: Secondary | ICD-10-CM | POA: Diagnosis not present

## 2014-10-12 DIAGNOSIS — R278 Other lack of coordination: Secondary | ICD-10-CM | POA: Diagnosis not present

## 2014-10-12 DIAGNOSIS — J9621 Acute and chronic respiratory failure with hypoxia: Secondary | ICD-10-CM | POA: Diagnosis not present

## 2014-10-12 DIAGNOSIS — F39 Unspecified mood [affective] disorder: Secondary | ICD-10-CM | POA: Diagnosis not present

## 2014-10-12 DIAGNOSIS — I509 Heart failure, unspecified: Secondary | ICD-10-CM | POA: Diagnosis not present

## 2014-10-12 DIAGNOSIS — F0390 Unspecified dementia without behavioral disturbance: Secondary | ICD-10-CM | POA: Diagnosis not present

## 2014-10-12 DIAGNOSIS — J449 Chronic obstructive pulmonary disease, unspecified: Secondary | ICD-10-CM | POA: Diagnosis not present

## 2014-10-12 DIAGNOSIS — E876 Hypokalemia: Secondary | ICD-10-CM | POA: Diagnosis not present

## 2014-10-12 DIAGNOSIS — I2510 Atherosclerotic heart disease of native coronary artery without angina pectoris: Secondary | ICD-10-CM | POA: Diagnosis not present

## 2014-10-12 DIAGNOSIS — J90 Pleural effusion, not elsewhere classified: Secondary | ICD-10-CM | POA: Diagnosis not present

## 2014-10-12 DIAGNOSIS — J189 Pneumonia, unspecified organism: Secondary | ICD-10-CM | POA: Diagnosis not present

## 2014-10-12 DIAGNOSIS — J9622 Acute and chronic respiratory failure with hypercapnia: Secondary | ICD-10-CM | POA: Diagnosis not present

## 2014-10-12 DIAGNOSIS — J9600 Acute respiratory failure, unspecified whether with hypoxia or hypercapnia: Secondary | ICD-10-CM | POA: Diagnosis not present

## 2014-10-12 LAB — CBC
HEMATOCRIT: 32.1 % — AB (ref 36.0–46.0)
Hemoglobin: 9.5 g/dL — ABNORMAL LOW (ref 12.0–15.0)
MCH: 29.3 pg (ref 26.0–34.0)
MCHC: 29.6 g/dL — ABNORMAL LOW (ref 30.0–36.0)
MCV: 99.1 fL (ref 78.0–100.0)
Platelets: 262 10*3/uL (ref 150–400)
RBC: 3.24 MIL/uL — AB (ref 3.87–5.11)
RDW: 17.5 % — ABNORMAL HIGH (ref 11.5–15.5)
WBC: 6.5 10*3/uL (ref 4.0–10.5)

## 2014-10-12 LAB — BASIC METABOLIC PANEL
Anion gap: 6 (ref 5–15)
BUN: 24 mg/dL — ABNORMAL HIGH (ref 6–23)
CO2: 40 mmol/L — AB (ref 19–32)
Calcium: 8.4 mg/dL (ref 8.4–10.5)
Chloride: 93 mmol/L — ABNORMAL LOW (ref 96–112)
Creatinine, Ser: 0.49 mg/dL — ABNORMAL LOW (ref 0.50–1.10)
GFR, EST NON AFRICAN AMERICAN: 84 mL/min — AB (ref >90)
GLUCOSE: 91 mg/dL (ref 70–99)
POTASSIUM: 4.4 mmol/L (ref 3.5–5.1)
SODIUM: 139 mmol/L (ref 135–145)

## 2014-10-12 MED ORDER — AZITHROMYCIN 250 MG PO TABS: 250.0000 mg | ORAL_TABLET | Freq: Every day | ORAL | Status: DC

## 2014-10-12 MED ORDER — POTASSIUM CHLORIDE CRYS ER 20 MEQ PO TBCR: 20.0000 meq | EXTENDED_RELEASE_TABLET | Freq: Every day | ORAL | Status: DC

## 2014-10-12 NOTE — Discharge Instructions (Signed)
Pleural Effusion °The lining covering your lungs and the inside of your chest is called the pleura. Usually, the space between the two pleura contains no air and only a thin layer of fluid. A pleural effusion is an abnormal buildup of fluid in the pleural space. °Fluid gathers when there is increased pressure in the lung vessels. This forces fluids out of the lungs and into the pleural space. Vessels may also leak fluids when there are infections, such as pneumonia, or other causes of soreness and redness (inflammation). Fluids leak into the lungs when protein in the blood is low or when certain vessels (lymphatics) are blocked. °Finding a pleural effusion is important because it is usually caused by another disease. In order to treat a pleural effusion, your health care provider needs to find its cause. If left untreated, a large amount of fluid can build up and cause collapse of the lung. °CAUSES  °· Heart failure. °· Infections (pneumonia, tuberculosis), pulmonary embolism, pulmonary infarction. °· Cancer (primary lung and metastatic), asbestosis. °· Liver failure (cirrhosis). °· Nephrotic syndrome, peritoneal dialysis, kidney problems (uremia). °· Collagen vascular disease (systemic lupus erythematosus, rheumatoid arthritis). °· Injury (trauma) to the chest or rupture of the digestive tube (esophagus). °· Material in the chest or pleural space (hemothorax, chylothorax). °· Pancreatitis. °· Surgery. °· Drug reactions. °SYMPTOMS  °A pleural effusion can decrease the amount of space available for breathing and make you short of breath. The fluid can become infected, which may cause pain and fever. Often, the pain is worse when taking a deep breath. The underlying disease (heart failure, pneumonia, blood clot, tuberculosis, cancer) may also cause symptoms. °DIAGNOSIS  °· Your health care provider can usually tell what is wrong by talking to you (taking a history), doing an exam, and taking a routine X-ray. If the  X-ray shows fluid in your chest, often fluid is removed from your chest with a needle for testing (diagnostic thoracentesis). °· Sometimes, more specialized X-rays may be needed. °· Sometimes, a small piece of tissue is removed and examined by a specialist (biopsy). °TREATMENT  °Treatment varies based on what caused the pleural effusion. Treatments include: °· Removing as much fluid as possible using a needle (thoracentesis) to improve the cough and shortness of breath. This is a simple procedure that can be done at bedside. The risks are bleeding, infection, collapse of a lung, or low blood pressure. °· Placing a tube in the chest to drain the effusion (tube thoracostomy). This is often used when there is an infection in the fluid. This is a simple procedure that can often be done at bedside or in a clinic. The procedure may be painful. The risks are the same as using a needle to drain the fluid. The chest tube usually remains for a few days and is connected to suction to improve fluid drainage. After placement, the tube usually does not cause much discomfort. °· Surgical removal of fibrous debris in and around the pleural space (decortication). This may be done with a flexible telescope (thoracoscope) through a small or large cut (incision). This is helpful for patients who have fibrosis or scar tissue that prevents complete lung expansion. The risks are infection, blood loss, and side effects from general anesthesia. °· Sometimes, a procedure called pleurodesis is done. A chest tube is placed and the fluid is drained. Next, an agent (tetracycline, talc powder) is added to the pleural space. This causes the lung and chest wall to stick together (adhesion). This leaves no   potential space for fluid to build up. The risks include infection, blood loss, and side effects from general anesthesia. °· If the effusion is caused by infection, it may be treated with antibiotics and may improve without draining. °HOME CARE  INSTRUCTIONS  °· Take any medicines exactly as prescribed. °· Follow up with your health care provider as directed. °· Monitor your exercise capacity (the amount of walking you can do before you get short of breath). °· Do not use any tobacco products including cigarettes, chewing tobacco, or electronic cigarettes. °SEEK MEDICAL CARE IF:  °· Your exercise capacity seems to get worse or does not improve with time. °· You do not recover from your illness. °· You have drainage, redness, swelling, or pain at any incision or puncture sites. °SEEK IMMEDIATE MEDICAL CARE IF:  °· Shortness of breath or chest pain develops or gets worse. °· You have a fever. °· You develop a new cough, especially if the mucus (phlegm) is discolored. °MAKE SURE YOU:  °· Understand these instructions. °· Will watch your condition. °· Will get help right away if you are not doing well or get worse. °Document Released: 05/31/2005 Document Revised: 10/15/2013 Document Reviewed: 01/20/2007 °[employer]® Patient Information ©2015 [employer]. This information is not intended to replace advice given to you by your health care provider. Make sure you discuss any questions you have with your health care provider. ° °

## 2014-10-12 NOTE — Discharge Summary (Signed)
Physician Discharge Summary  Jennifer Nielsen HGD:[id number] DOB: Jan 27, 1925 DOA: 10/09/2014  PCP: [name], MD  Admit date: 10/09/2014 Discharge date: 10/12/2014  Recommendations for Outpatient Follow-up:  1. Pt will need to follow up with PCP in 2 weeks post discharge 2. Please obtain BMP to evaluate electrolytes and kidney function 3. Please also check CBC to evaluate Hg and Hct levels 4. Please note patient advised to continue taking Zithromax for 5 more days post discharge 5. Please monitor weight daily, weight on discharge 184 Lbs 6. Patient advised to continue taking Lasix 40 mg by mouth twice a day, dose may be readjusted based on weight and respiratory status 7. Pt needs to stay on oxygen 2 L [place] upon discharge   Discharge Diagnoses:  Principal Problem:   Acute respiratory failure with hypoxia Active Problems:   Long term current use of anticoagulant therapy   Atrial fibrillation   COPD exacerbation   Palliative care encounter   Acute diastolic CHF (congestive heart failure)   Elevated troponin   Sepsis due to pneumonia   Discharge Condition: Stable  Diet recommendation: Heart healthy diet discussed in details   History of present illness:  81 year old female with COPD, dementia, atrial fibrillation on anticoagulation with xarelto, mild chronic diastolic CHF (last 2 D ECHO in 08/2014 with preserved EF), recent hospitalization for COPD exacerbation who presented from [facility] place due to reports of worsening shortness of breath since the night prior to the admission with associated wheezing and requiring NRM to keep O2 sats above 90%. Pt is not reliable historian due to dementia.   Pt initially seen by paramedic and O2 sats at that time in 80's but improved with NRM.  In ED, BP was 134/89, HR 78-115, RR 22-47. Oxygen saturation was 91-97% with [place] oxygen support. Blood work was notable for WBC 12.5, Hg 1.3, troponin 0.07. The 12 lead EKG showed atrial fibrillation. CXR showed  CHF with superimposed pneumonia and left pleural effusion. She was started on azithromycin and rocephin on admission. She was admitted to telemetry unit.   Assessment/Plan:    Acute on chronic respiratory failure with hypoxia and hypercapnia  - appears to be be multifactorial and secondary to acute on chronic diastolic CHF, left pleural effusion, ? Bibasilar PNA, acute COPD - Patient denies dyspnea this morning, once to go back to [place] place - Repeat CT chest this morning indicative of small bilateral pleural effusion with questionable lung atelectasis in the right middle/lower lobes - Encouraged incentive spirometry, patient educated on use of spirometry - Patient is status post left sided thoracentesis 4/27, 700 mL of fluid removed and sent for analysis, no bacteria noted and urine - Repeat chest x-ray indicative of near resolution of left-sided pleural effusion 4/28 - Continue Lasix 40 mg BID PO upon discharge, weight since admission: 181 --> 187 --> 186 --. 184 lbs this AM  Active Problems: Acute encephalopathy night of 4/28 - requiring rapid response team to arrive at the bedside, pt noted to be somnolent but hemodynamically stable with mild respiratory distress - pt is stable this AM, denies chest pain or shortness of breath, once to go back to [place] place - Patient stable for discharge  Sepsis due to pneumonia, bibasilar, unknown pathogen - Sepsis criteria met on admission with HR 115, RR up to 40s, lactic acid 3.8, WBC 12.5 - Sepsis order set placed on admission - Please note that consideration for HCAP given on admission but since pt did not appear toxic, Zithromax  and Rocephin were thought to be appropriate initial antibiotics - Patient seems to be responding well to these antibiotics, remains afebrile since admission, WBC trending down and WNL, lactic acid now within normal limits - Continue Zithromax upon discharge to complete therapy for 5 more days   Hyperkalemia - As  patient is on Lasix, we'll continue potassium supplementation but will lower the dose and frequency to 20 mEq by mouth daily - K is WNL  Left pleural effusion - Please note that patient underwent recent thoracentesis 09/15/2014, 1 L fluid removed - Repeat thoracentesis done 10/09/2014, 700 mL fluid removed and sent for analysis, no bacteria and fluid noted, no signs of malignant effusion  Acute on Chronic obstructive pulmonary disease exacerbation - No wheezing on exam, continue bronchodilators scheduled and as needed - Since no wheezing on exam this morning, no need for steroids at this time  Acute on chronic diastolic CHF - Recent 2-D echo March 2016 with preserved EF, no need for repeat 2-D echo - Patient with no crackles on exam this morning - Weight since admission 181 --> 187 --> 186 --> 184 lbs this morning - Continue Lasix 40 mg PO BID as per previous home medical regimen   Chronic atrial fibrillation - CHADS vasc score at least 3 - On anticoagulation with xarelto - Rate controlled with Cardizem.  - Continue digoxin  Mild troponin elevation - Likely due to demand ischemia from CHF exacerbation - Troponin trend: 0.07 --> 0.06 --> 0.04 - No reports of chest pain and CE were therefore not cycled  - 12 lead EKG showed atrial fibrillation   Anemia of chronic disease, IDA - drop in Hg since admission - no signs of active bleeding  Mild to moderate compression deformities pole throughout the thoracic spine - per Ct report likely worse at C5-C6 and C6-C7, similar to remote chest radiograph performed 02/2014  - no point tenderness at these locations noted on physical exam   Obesity - Body mass index is 31.18  DVT prophylaxis:  - SCD's bilaterally, Xarelto  Code Status: DNR/DNI Family Communication: pt at bedside  Disposition Plan: [place]    Procedures and diagnostic studies:   CXR 10/10/2014 Near complete resolution of effusion on the left following  thoracentesis. No pneumothorax. Small right effusion with right base consolidation.   CXR 09/16/2014 Decrease in size of the left-sided pleural effusion. Small effusions and bibasilar atelectasis. Stable vascular congestion  CXR 10/09/2014 Congestive heart failure. ? pneumonia in the bases. Effusion on the left is larger compared to recent prior study.   US Thoracentesis 09/15/2014 Successful ultrasound-guided left sided thoracentesis yielding 1 liter of pleural fluid.   Ct Chest Wo Contrast  10/12/2014   Small right and trace left pleural effusions with associated atelectasis / collapse of the right middle and lower lobes and subsegmental atelectasis involving the left lower lobe. Note, underlying infection is not excluded on the basis of this examination. 2. Mild centrilobular emphysematous change. 3. Cardiomegaly.  Coronary artery calcifications. 4. Right-sided adrenal nodules are grossly unchanged since the 2012 examination and thus of benign etiology, likely adrenal adenomas. 5. Mild to moderate compression deformities pole throughout the thoracic spine, likely worse at C5-C6 and C6-C7, similar to remote chest radiograph performed 02/2014 though correlation for point tenderness at these locations is recommended.     Medical Consultants:  IR for thoracentesis   Other Consultants:  None   IAnti-Infectives:   Zithromax 4/27 --> continue for 5 more days post discharge  Rocephin  4/27 --> 4/30      Discharge Exam: Filed Vitals:   10/12/14 0505  BP: 127/57  Pulse: 77  Temp: 98.3 F (36.8 C)  Resp: 19   Filed Vitals:   10/12/14 0314 10/12/14 0505 10/12/14 0820 10/12/14 1144  BP:  127/57    Pulse:  77    Temp:  98.3 F (36.8 C)    TempSrc:  Oral    Resp:  19    Height:      Weight:  83.7 kg (184 lb 8.4 oz)    SpO2: 98% 99% 98% 98%    General: Pt is alert, follows commands appropriately, not in acute distress Cardiovascular: Regular rate and rhythm, S1/S2 +, no  murmurs, no rubs, no gallops Respiratory: Clear to auscultation bilaterally, no wheezing, diminished breath sounds at bases Abdominal: Soft, non tender, non distended, bowel sounds +, no guarding Extremities: no cyanosis, pulses palpable bilaterally DP and PT  Discharge Instructions     Medication List    TAKE these medications        azithromycin 250 MG tablet  Commonly known as:  ZITHROMAX  Take 1 tablet (250 mg total) by mouth daily.     budesonide-formoterol 160-4.5 MCG/ACT inhaler  Commonly known as:  SYMBICORT  Inhale 2 puffs into the lungs 2 (two) times daily.     CALTRATE 600+D PO  Take 1 tablet by mouth every morning. For calcium supplement     DECUBI-VITE Caps  Take 1 capsule by mouth daily.     digoxin 0.25 MG tablet  Commonly known as:  LANOXIN  Take 0.25 mg by mouth every other day. Alternate between  0.125 and 0.250     digoxin 0.125 MG tablet  Commonly known as:  LANOXIN  Take 0.125 mg by mouth every other day.     diltiazem 120 MG 24 hr capsule  Commonly known as:  CARDIZEM CD  Take 120 mg by mouth daily. For A-Fib     DSS 100 MG Caps  Take 100 mg by mouth daily. For constipation     furosemide 40 MG tablet  Commonly known as:  LASIX  Take 40 mg by mouth 2 (two) times daily.     guaiFENesin 600 MG 12 hr tablet  Commonly known as:  MUCINEX  Take 1 tablet (600 mg total) by mouth 2 (two) times daily.     ipratropium-albuterol 0.5-2.5 (3) MG/3ML Soln  Commonly known as:  DUONEB  33ml nebulized 4 times daily scheduled and every 4hrs as needed for wheezing for 3 days. And then every 4hours as needed for wheezing.     NUTRITIONAL DRINK PO  Take 120 mLs by mouth 2 (two) times daily. MIGHTY SHAKES     potassium chloride SA 20 MEQ tablet  Commonly known as:  K-DUR,KLOR-CON  Take 1 tablet (20 mEq total) by mouth daily.     PROCEL Powd  Take 2 scoop by mouth 2 (two) times daily.     SYSTANE OP  Place 1 drop into both eyes 2 (two) times daily. For  dry eyes     TYLENOL 500 MG tablet  Generic drug:  acetaminophen  Take 500 mg by mouth 3 (three) times daily.     XARELTO 20 MG Tabs tablet  Generic drug:  rivaroxaban  Take 20 mg by mouth daily. For DVT           Follow-up Information    Schedule an appointment as soon as possible for a visit  with DAY,JAMES, MD.   Specialty:  Internal Medicine       The results of significant diagnostics from this hospitalization (including imaging, microbiology, ancillary and laboratory) are listed below for reference.     Microbiology: Recent Results (from the past 240 hour(s))  Culture, blood (routine x 2) Call MD if unable to obtain prior to antibiotics being given     Status: None (Preliminary result)   Collection Time: 10/09/14  3:50 PM  Result Value Ref Range Status   Specimen Description BLOOD RIGHT ANTECUBITAL  Final   Special Requests BOTTLES DRAWN AEROBIC AND ANAEROBIC 5CC EACH  Final   Culture   Final           BLOOD CULTURE RECEIVED NO GROWTH TO DATE CULTURE WILL BE HELD FOR 5 DAYS BEFORE ISSUING A FINAL NEGATIVE REPORT Performed at [facility]    Report Status PENDING  Incomplete  Culture, blood (routine x 2) Call MD if unable to obtain prior to antibiotics being given     Status: None (Preliminary result)   Collection Time: 10/09/14  3:50 PM  Result Value Ref Range Status   Specimen Description BLOOD RIGHT ARM  Final   Special Requests BOTTLES DRAWN AEROBIC AND ANAEROBIC 5CC EACH  Final   Culture   Final           BLOOD CULTURE RECEIVED NO GROWTH TO DATE CULTURE WILL BE HELD FOR 5 DAYS BEFORE ISSUING A FINAL NEGATIVE REPORT Performed at [facility]    Report Status PENDING  Incomplete  Body fluid culture     Status: None (Preliminary result)   Collection Time: 10/10/14 11:21 AM  Result Value Ref Range Status   Specimen Description PLEURAL  Final   Special Requests NONE  Final   Gram Stain   Final    NO WBC SEEN NO ORGANISMS SEEN Performed at  [facility]    Culture   Final    NO GROWTH 1 DAY Performed at [facility]    Report Status PENDING  Incomplete     Labs: Basic Metabolic Panel:  Recent Labs Lab 10/09/14 1753 10/10/14 0446 10/11/14 10/11/14 1120 10/12/14 0416  NA 139 137 135 139 139  K 4.4 5.1 5.3* 4.9 4.4  CL 96 93* 92* 94* 93*  CO2 34* 35* 37* 36* 40*  GLUCOSE 205* 169* 183* 149* 91  BUN 21 27* 31* 26* 24*  CREATININE 0.76 0.64 0.59 0.56 0.49*  CALCIUM 8.6 8.7 8.8 9.0 8.4  MG 2.2  --   --   --   --    Liver Function Tests:  Recent Labs Lab 10/09/14 1753  AST 27  ALT 23  ALKPHOS 81  BILITOT 0.8  PROT 7.0  ALBUMIN 2.9*   CBC:  Recent Labs Lab 10/09/14 1405 10/09/14 1753 10/11/14 10/12/14 0416  WBC 12.5* 11.4* 10.3 6.5  NEUTROABS 10.2* 10.7*  --   --   HGB 11.3* 11.1* 9.7* 9.5*  HCT 37.3 36.6 31.6* 32.1*  MCV 98.4 99.7 97.5 99.1  PLT 253 266 266 262   Cardiac Enzymes:  Recent Labs Lab 10/09/14 1405 10/09/14 1753 10/09/14 2215 10/10/14 0446  TROPONINI 0.07* 0.06* 0.05* 0.04*   BNP: BNP (last 3 results)  Recent Labs  09/14/14 2300 10/09/14 1404 10/09/14 1753  BNP 371.9* 248.2* 309.6*    ProBNP (last 3 results)  Recent Labs  02/22/14 1417 06/17/14 1050  PROBNP 2244.0* 170.0*    CBG:  Recent Labs Lab  10/10/14 2333  GLUCAP 190*     SIGNED: Time coordinating discharge: 30 minutes  Faye Ramsay, MD  [facility] 10/12/2014, 12:11 PM Pager [phone]  If 7PM-7AM, please contact night-coverage [email or web address] Password [email or web address]

## 2014-10-12 NOTE — Progress Notes (Signed)
CRITICAL VALUE ALERT  Critical value received:  CO2 40   Date of notification:  4/30  Time of notification:  0515  Critical value read back:Yes.    Nurse who received alert:  Viliami Bracco, Bing Neighbors, RN  MD notified (1st page):  [place]  Time of first page:  [phone]  MD notified (2nd page):  Time of second page:  Responding MD:  Donnal Debar  Time MD responded:  [phone]

## 2014-10-12 NOTE — Progress Notes (Addendum)
SATURATION QUALIFICATIONS: (This note is used to comply with regulatory documentation for home oxygen)  Patient Saturations on Room Air at Rest = 95%  Patient saturation on Room air at exertion = 86%  Patient saturation on 2L O2 at exertion 95%

## 2014-10-13 LAB — BODY FLUID CULTURE
CULTURE: NO GROWTH
Gram Stain: NONE SEEN

## 2014-10-13 NOTE — Clinical Social Work Note (Signed)
CSW received a call from RN that pt was ready for discharge today.    CSW called and spoke with representative at [facility] to discuss discharge to SNF  CSW met with pt at bedside to discuss transfer to [place], faxed discharge summary to SNF, prepared packet and called for pt transport  CSW signing off  .Dede Query, LCSW [facility] Clinical Social Worker - Weekend Coverage cell #: [phone]

## 2014-10-14 ENCOUNTER — Non-Acute Institutional Stay (SKILLED_NURSING_FACILITY): Payer: Medicare Other | Admitting: Adult Health

## 2014-10-14 DIAGNOSIS — J189 Pneumonia, unspecified organism: Secondary | ICD-10-CM | POA: Diagnosis not present

## 2014-10-14 DIAGNOSIS — I5032 Chronic diastolic (congestive) heart failure: Secondary | ICD-10-CM

## 2014-10-14 DIAGNOSIS — I482 Chronic atrial fibrillation, unspecified: Secondary | ICD-10-CM

## 2014-10-14 DIAGNOSIS — E876 Hypokalemia: Secondary | ICD-10-CM | POA: Diagnosis not present

## 2014-10-14 DIAGNOSIS — J449 Chronic obstructive pulmonary disease, unspecified: Secondary | ICD-10-CM

## 2014-10-14 DIAGNOSIS — J9622 Acute and chronic respiratory failure with hypercapnia: Secondary | ICD-10-CM

## 2014-10-14 DIAGNOSIS — R5381 Other malaise: Secondary | ICD-10-CM | POA: Diagnosis not present

## 2014-10-14 DIAGNOSIS — D638 Anemia in other chronic diseases classified elsewhere: Secondary | ICD-10-CM

## 2014-10-15 ENCOUNTER — Non-Acute Institutional Stay (SKILLED_NURSING_FACILITY): Payer: Medicare Other | Admitting: Internal Medicine

## 2014-10-15 DIAGNOSIS — E876 Hypokalemia: Secondary | ICD-10-CM

## 2014-10-15 DIAGNOSIS — J189 Pneumonia, unspecified organism: Secondary | ICD-10-CM | POA: Diagnosis not present

## 2014-10-15 DIAGNOSIS — I5032 Chronic diastolic (congestive) heart failure: Secondary | ICD-10-CM | POA: Diagnosis not present

## 2014-10-15 DIAGNOSIS — J441 Chronic obstructive pulmonary disease with (acute) exacerbation: Secondary | ICD-10-CM | POA: Diagnosis not present

## 2014-10-15 DIAGNOSIS — R5381 Other malaise: Secondary | ICD-10-CM | POA: Diagnosis not present

## 2014-10-15 DIAGNOSIS — J9621 Acute and chronic respiratory failure with hypoxia: Secondary | ICD-10-CM

## 2014-10-15 DIAGNOSIS — I482 Chronic atrial fibrillation, unspecified: Secondary | ICD-10-CM

## 2014-10-15 DIAGNOSIS — D638 Anemia in other chronic diseases classified elsewhere: Secondary | ICD-10-CM

## 2014-10-15 DIAGNOSIS — J90 Pleural effusion, not elsewhere classified: Secondary | ICD-10-CM | POA: Diagnosis not present

## 2014-10-15 LAB — CULTURE, BLOOD (ROUTINE X 2)
CULTURE: NO GROWTH
CULTURE: NO GROWTH

## 2014-10-15 NOTE — Progress Notes (Signed)
Patient ID: Jennifer Nielsen, female   DOB: 03/17/1925, 81 y.o.   MRN: [id number]     [facility]   PCP: DAY,JAMES, MD  Code Status: DNR  Allergies  Allergen Reactions  . Chocolate Itching  . Ciprofloxacin Itching  . Coffee Bean Extract [Coffea Arabica] Itching  . Coffee Flavor Itching  . Eggs Or Egg-Derived Products   . Gentamycin [Gentamicin] Itching  . Imodium [Loperamide] Other (See Comments)    unknown  . Penicillins Itching  . Sulfa Antibiotics Itching  . Tea Itching    Chief Complaint  Patient presents with  . Readmit To SNF     HPI:  81 year old patient is here for long term care post hospital admission from 10/09/14-10/12/14 with acute on chronic respiratory failure. It was thought to be multifactorial with chf exacerbation, left sided pleural effusion, questionable pneumonia, copd exacerbation. She was started on antibiotics and underwent left thoracocentesis. She was in the hospital with similar presentation 4 weeks back.  She has PMH of COPD, chronic atrial fibrillation on anticoagulation and diastolic CHF.   She is seen in her room today. She is pleasantly confused, refusing to wear her oxygen. She is watching television and feeding herself. Denies any chest pain or dyspnea.   Review of Systems:  Constitutional: Negative for fever, chills, diaphoresis. positive for fatigue HENT: Negative for headache, congestion Eyes: Negative for eye pain, blurred vision, double vision and discharge.  Respiratory: Negative for cough, shortness of breath and wheezing.   Cardiovascular: Negative for chest pain, palpitations, leg swelling.  Gastrointestinal: Negative for heartburn, nausea, vomiting, abdominal pain Genitourinary: Negative for dysuria Musculoskeletal: Negative for back pain, falls in facility. Positive for weakness Skin: Negative for itching, rash.  Neurological: Negative for dizziness, tingling Psychiatric/Behavioral: Negative for  depression    Past Medical History  Diagnosis Date  . Arrhythmia   . Arthritis   . Atrial fibrillation     Remains stable (As of 05/09/13)  . H/O blood clots   . Hyperlipemia   . GERD (gastroesophageal reflux disease)     Stable (As of 05/09/13)  . Osteoarthritis     Denies pain (As of 05/09/13)  . CHF (congestive heart failure)     chronic diastolic  . COPD (chronic obstructive pulmonary disease)     w/exacerbation  . Dementia     Remains stable & continues to function adequately in the current living environment with supervision. Has had little changes in behavior (AS OF 05/09/13)   Past Surgical History  Procedure Laterality Date  . Appendectomy    . Gallbladder surgery    . Hip surgery      Right-Metal plate   . Leg surgery      Left leg   Social History:   reports that she has quit smoking. She has never used smokeless tobacco. She reports that she does not drink alcohol or use illicit drugs.  Family History  Problem Relation Age of Onset  . Colon cancer Neg Hx   . Heart disease Father   . Breast cancer Daughter     Medications: Patient's Medications  New Prescriptions   No medications on file  Previous Medications   ACETAMINOPHEN (TYLENOL) 500 MG TABLET    Take 500 mg by mouth 3 (three) times daily.    AZITHROMYCIN (ZITHROMAX) 250 MG TABLET    Take 1 tablet (250 mg total) by mouth daily.   BUDESONIDE-FORMOTEROL (SYMBICORT) 160-4.5 MCG/ACT INHALER    Inhale 2  puffs into the lungs 2 (two) times daily.   CALCIUM CARBONATE-VITAMIN D (CALTRATE 600+D PO)    Take 1 tablet by mouth every morning. For calcium supplement   DIGOXIN (LANOXIN) 0.125 MG TABLET    Take 0.125 mg by mouth every other day.   DIGOXIN (LANOXIN) 0.25 MG TABLET    Take 0.25 mg by mouth every other day. Alternate between  0.125 and 0.250   DILTIAZEM (CARDIZEM CD) 120 MG 24 HR CAPSULE    Take 120 mg by mouth daily. For A-Fib   DOCUSATE SODIUM (DSS) 100 MG CAPS    Take 100 mg by mouth daily. For  constipation   FUROSEMIDE (LASIX) 40 MG TABLET    Take 40 mg by mouth 2 (two) times daily.   GUAIFENESIN (MUCINEX) 600 MG 12 HR TABLET    Take 1 tablet (600 mg total) by mouth 2 (two) times daily.   IPRATROPIUM-ALBUTEROL (DUONEB) 0.5-2.5 (3) MG/3ML SOLN    61ml nebulized 4 times daily scheduled and every 4hrs as needed for wheezing for 3 days. And then every 4hours as needed for wheezing.   MULTIPLE VITAMINS-MINERALS (DECUBI-VITE) CAPS    Take 1 capsule by mouth daily.   NUTRITIONAL SUPPLEMENTS (NUTRITIONAL DRINK PO)    Take 120 mLs by mouth 2 (two) times daily. MIGHTY SHAKES   POLYETHYL GLYCOL-PROPYL GLYCOL (SYSTANE OP)    Place 1 drop into both eyes 2 (two) times daily. For dry eyes   POTASSIUM CHLORIDE SA (K-DUR,KLOR-CON) 20 MEQ TABLET    Take 1 tablet (20 mEq total) by mouth daily.   PROTEIN (PROCEL) POWD    Take 2 scoop by mouth 2 (two) times daily.   RIVAROXABAN (XARELTO) 20 MG TABS TABLET    Take 20 mg by mouth daily. For DVT  Modified Medications   No medications on file  Discontinued Medications   No medications on file     Physical Exam:  Filed Vitals:   10/15/14 2109  BP: 142/64  Pulse: 78  Temp: 97.6 F (36.4 C)  Resp: 18  SpO2: 96%   General- elderly female, obese, chronically ill appearing, in no acute distress Head- normocephalic, atraumatic Throat- moist mucus membrane Eyes- no pallor, no icterus, no discharge, normal conjunctiva, normal sclera Neck- no cervical lymphadenopathy Cardiovascular- normal s1,s2, no murmurs, no leg edema Respiratory- bilateral poor inspiratory effort with decreased air entry, on o2, no wheeze, no rhonchi, no crackles, no use of accessory muscles Abdomen- bowel sounds present, soft, non tender Musculoskeletal- generalized weakness more in lower extremities Neurological- alert but pleasantly confused Skin- warm and dry Psychiatry- calm with normal affect   Labs reviewed: Basic Metabolic Panel:  Recent Labs  08/17/14 0420  08/18/14 0415  10/09/14 1753  10/11/14 10/11/14 1120 10/12/14 0416  NA 137 137  < > 139  < > 135 139 139  K 3.5 3.2*  < > 4.4  < > 5.3* 4.9 4.4  CL 84* 79*  < > 96  < > 92* 94* 93*  CO2 43* 46*  < > 34*  < > 37* 36* 40*  GLUCOSE 120* 142*  < > 205*  < > 183* 149* 91  BUN 28* 31*  < > 21  < > 31* 26* 24*  CREATININE 0.49* 0.58  < > 0.76  < > 0.59 0.56 0.49*  CALCIUM 7.9* 8.2*  < > 8.6  < > 8.8 9.0 8.4  MG 2.1 2.1  --  2.2  --   --   --   --   < > =  values in this interval not displayed. Liver Function Tests:  Recent Labs  08/12/14 0356 09/15/14 0907 10/09/14 1753  AST 22 28 27   ALT 12 28 23   ALKPHOS 84 82 81  BILITOT 0.8 0.3 0.8  PROT 6.8 6.4 7.0  ALBUMIN 2.6* 2.6* 2.9*   No results for input(s): LIPASE, AMYLASE in the last 8760 hours.  Recent Labs  08/11/14 2251 08/19/14 0510  AMMONIA 22 27   CBC:  Recent Labs  09/14/14 2203  10/09/14 1405 10/09/14 1753 10/11/14 10/12/14 0416  WBC 14.2*  < > 12.5* 11.4* 10.3 6.5  NEUTROABS 10.3*  --  10.2* 10.7*  --   --   HGB 12.3  < > 11.3* 11.1* 9.7* 9.5*  HCT 39.4  < > 37.3 36.6 31.6* 32.1*  MCV 98.0  < > 98.4 99.7 97.5 99.1  PLT 291  < > 253 266 266 262  < > = values in this interval not displayed. Cardiac Enzymes:  Recent Labs  10/09/14 1753 10/09/14 2215 10/10/14 0446  TROPONINI 0.06* 0.05* 0.04*   BNP: Invalid input(s): POCBNP CBG:  Recent Labs  10/10/14 2333  GLUCAP 190*   Imaging reviewed CXR  10/10/2014  Near complete resolution of effusion on the left following thoracentesis. No pneumothorax. Small right effusion with right base consolidation.   CXR  10/09/2014  Congestive heart failure. ? pneumonia in the bases. Effusion on the left is larger compared to recent prior study.     US Thoracentesis  09/15/2014   Successful ultrasound-guided left sided thoracentesis yielding 1 liter of pleural fluid.    Ct Chest Wo Contrast  10/12/2014   Small right and trace left pleural effusions with associated  atelectasis / collapse of the right middle and lower lobes and subsegmental atelectasis involving the left lower lobe. Note, underlying infection is not excluded on the basis of this examination. 2. Mild centrilobular emphysematous change. 3. Cardiomegaly.  Coronary artery calcifications. 4. Right-sided adrenal nodules are grossly unchanged since the 2012 examination and thus of benign etiology, likely adrenal adenomas. 5. Mild to moderate compression deformities pole throughout the thoracic spine, likely worse at C5-C6 and C6-C7, similar to remote chest radiograph performed 02/2014 though correlation for point tenderness at these locations is recommended.    Assessment/Plan  Physical deconditioning With her recurrent hospital admission. Will have patient work with PT/OT as tolerated to regain strength and restore function.  Fall precautions are in place. Will need to set up family meeting to review goals of care for her.  Acute on chronic respiratory failure   With pleural effusion, pneumonia, copd and chf. Continue and complete course of azithromycin. Continue o2 continuously. To use incentive spirometer.  Left Pleural effusion S/P left sided thoracocentesis, 700 cc fluid removed. clinically improved  CAP Complete course of azithromycin, continue breathing exercises and pulmonary toileting  COPD exacerbation Continue o2, complete course of azitrhomycin. Continue duoneb and symbicort  Chronic diastolic CHF Monitor breathing and weight. continue digoxin, Lasix 40 mg bid, o2. monitor bmp  Anemia of chronic disease Monitor cbc  Chronic atrial fibrillation Rate controlled. continue Cardizem 120 mg daily and Xarelto 20 mg daily with digoxin  Hypokalemia continue K Dur and monitor bmp   Goals of care: short term rehabilitation   Labs/tests ordered: cbc, bmp  Family/ staff Communication: reviewed care plan with patient and nursing supervisor    Blanchie Serve, MD  [facility] [phone] (Monday-Friday 8 am - 5 pm) [phone] (afterhours)

## 2014-10-31 DIAGNOSIS — M25551 Pain in right hip: Secondary | ICD-10-CM | POA: Diagnosis not present

## 2014-10-31 DIAGNOSIS — L602 Onychogryphosis: Secondary | ICD-10-CM | POA: Diagnosis not present

## 2014-10-31 DIAGNOSIS — L97429 Non-pressure chronic ulcer of left heel and midfoot with unspecified severity: Secondary | ICD-10-CM | POA: Diagnosis not present

## 2014-11-07 ENCOUNTER — Other Ambulatory Visit (HOSPITAL_COMMUNITY): Payer: Self-pay

## 2014-11-07 ENCOUNTER — Encounter (HOSPITAL_COMMUNITY): Payer: Self-pay

## 2014-11-07 ENCOUNTER — Inpatient Hospital Stay (HOSPITAL_COMMUNITY)
Admission: EM | Admit: 2014-11-07 | Discharge: 2014-11-20 | DRG: 871 | Disposition: A | Discharge status: Skilled Nursing Facility | Payer: Medicare Other | Attending: Family Medicine | Admitting: Family Medicine

## 2014-11-07 ENCOUNTER — Emergency Department (HOSPITAL_COMMUNITY): Payer: Medicare Other

## 2014-11-07 DIAGNOSIS — R0602 Shortness of breath: Secondary | ICD-10-CM | POA: Diagnosis not present

## 2014-11-07 DIAGNOSIS — I5033 Acute on chronic diastolic (congestive) heart failure: Secondary | ICD-10-CM | POA: Diagnosis present

## 2014-11-07 DIAGNOSIS — Z881 Allergy status to other antibiotic agents status: Secondary | ICD-10-CM | POA: Diagnosis not present

## 2014-11-07 DIAGNOSIS — Z79899 Other long term (current) drug therapy: Secondary | ICD-10-CM | POA: Diagnosis not present

## 2014-11-07 DIAGNOSIS — Z66 Do not resuscitate: Secondary | ICD-10-CM | POA: Diagnosis present

## 2014-11-07 DIAGNOSIS — I071 Rheumatic tricuspid insufficiency: Secondary | ICD-10-CM | POA: Diagnosis present

## 2014-11-07 DIAGNOSIS — I5020 Unspecified systolic (congestive) heart failure: Secondary | ICD-10-CM

## 2014-11-07 DIAGNOSIS — I5021 Acute systolic (congestive) heart failure: Secondary | ICD-10-CM | POA: Diagnosis not present

## 2014-11-07 DIAGNOSIS — I340 Nonrheumatic mitral (valve) insufficiency: Secondary | ICD-10-CM | POA: Diagnosis present

## 2014-11-07 DIAGNOSIS — Z8249 Family history of ischemic heart disease and other diseases of the circulatory system: Secondary | ICD-10-CM

## 2014-11-07 DIAGNOSIS — I351 Nonrheumatic aortic (valve) insufficiency: Secondary | ICD-10-CM | POA: Diagnosis present

## 2014-11-07 DIAGNOSIS — J90 Pleural effusion, not elsewhere classified: Secondary | ICD-10-CM

## 2014-11-07 DIAGNOSIS — Y95 Nosocomial condition: Secondary | ICD-10-CM | POA: Diagnosis present

## 2014-11-07 DIAGNOSIS — J810 Acute pulmonary edema: Secondary | ICD-10-CM

## 2014-11-07 DIAGNOSIS — I248 Other forms of acute ischemic heart disease: Secondary | ICD-10-CM | POA: Diagnosis present

## 2014-11-07 DIAGNOSIS — J9622 Acute and chronic respiratory failure with hypercapnia: Secondary | ICD-10-CM | POA: Diagnosis present

## 2014-11-07 DIAGNOSIS — Z9889 Other specified postprocedural states: Secondary | ICD-10-CM | POA: Diagnosis not present

## 2014-11-07 DIAGNOSIS — D638 Anemia in other chronic diseases classified elsewhere: Secondary | ICD-10-CM | POA: Diagnosis present

## 2014-11-07 DIAGNOSIS — K219 Gastro-esophageal reflux disease without esophagitis: Secondary | ICD-10-CM | POA: Diagnosis present

## 2014-11-07 DIAGNOSIS — Z7901 Long term (current) use of anticoagulants: Secondary | ICD-10-CM | POA: Diagnosis not present

## 2014-11-07 DIAGNOSIS — Z888 Allergy status to other drugs, medicaments and biological substances status: Secondary | ICD-10-CM

## 2014-11-07 DIAGNOSIS — D539 Nutritional anemia, unspecified: Secondary | ICD-10-CM | POA: Diagnosis present

## 2014-11-07 DIAGNOSIS — E875 Hyperkalemia: Secondary | ICD-10-CM | POA: Diagnosis present

## 2014-11-07 DIAGNOSIS — E872 Acidosis: Secondary | ICD-10-CM | POA: Diagnosis present

## 2014-11-07 DIAGNOSIS — R0989 Other specified symptoms and signs involving the circulatory and respiratory systems: Secondary | ICD-10-CM | POA: Diagnosis not present

## 2014-11-07 DIAGNOSIS — R278 Other lack of coordination: Secondary | ICD-10-CM | POA: Diagnosis not present

## 2014-11-07 DIAGNOSIS — J9690 Respiratory failure, unspecified, unspecified whether with hypoxia or hypercapnia: Secondary | ICD-10-CM | POA: Diagnosis present

## 2014-11-07 DIAGNOSIS — Z91012 Allergy to eggs: Secondary | ICD-10-CM | POA: Diagnosis not present

## 2014-11-07 DIAGNOSIS — J189 Pneumonia, unspecified organism: Secondary | ICD-10-CM | POA: Diagnosis present

## 2014-11-07 DIAGNOSIS — J9621 Acute and chronic respiratory failure with hypoxia: Secondary | ICD-10-CM | POA: Diagnosis present

## 2014-11-07 DIAGNOSIS — F0390 Unspecified dementia without behavioral disturbance: Secondary | ICD-10-CM | POA: Diagnosis present

## 2014-11-07 DIAGNOSIS — I472 Ventricular tachycardia: Secondary | ICD-10-CM | POA: Diagnosis present

## 2014-11-07 DIAGNOSIS — J9610 Chronic respiratory failure, unspecified whether with hypoxia or hypercapnia: Secondary | ICD-10-CM | POA: Diagnosis not present

## 2014-11-07 DIAGNOSIS — A419 Sepsis, unspecified organism: Secondary | ICD-10-CM | POA: Diagnosis not present

## 2014-11-07 DIAGNOSIS — E870 Hyperosmolality and hypernatremia: Secondary | ICD-10-CM

## 2014-11-07 DIAGNOSIS — I272 Other secondary pulmonary hypertension: Secondary | ICD-10-CM | POA: Diagnosis present

## 2014-11-07 DIAGNOSIS — E785 Hyperlipidemia, unspecified: Secondary | ICD-10-CM | POA: Diagnosis present

## 2014-11-07 DIAGNOSIS — J811 Chronic pulmonary edema: Secondary | ICD-10-CM | POA: Diagnosis not present

## 2014-11-07 DIAGNOSIS — Z880 Allergy status to penicillin: Secondary | ICD-10-CM | POA: Diagnosis not present

## 2014-11-07 DIAGNOSIS — G9340 Encephalopathy, unspecified: Secondary | ICD-10-CM | POA: Diagnosis present

## 2014-11-07 DIAGNOSIS — J9811 Atelectasis: Secondary | ICD-10-CM | POA: Diagnosis not present

## 2014-11-07 DIAGNOSIS — I5031 Acute diastolic (congestive) heart failure: Secondary | ICD-10-CM | POA: Diagnosis not present

## 2014-11-07 DIAGNOSIS — J9620 Acute and chronic respiratory failure, unspecified whether with hypoxia or hypercapnia: Secondary | ICD-10-CM | POA: Diagnosis not present

## 2014-11-07 DIAGNOSIS — E876 Hypokalemia: Secondary | ICD-10-CM | POA: Diagnosis present

## 2014-11-07 DIAGNOSIS — I4891 Unspecified atrial fibrillation: Secondary | ICD-10-CM | POA: Diagnosis not present

## 2014-11-07 DIAGNOSIS — Z5189 Encounter for other specified aftercare: Secondary | ICD-10-CM | POA: Diagnosis not present

## 2014-11-07 DIAGNOSIS — J9601 Acute respiratory failure with hypoxia: Secondary | ICD-10-CM | POA: Diagnosis not present

## 2014-11-07 DIAGNOSIS — Z87891 Personal history of nicotine dependence: Secondary | ICD-10-CM | POA: Diagnosis not present

## 2014-11-07 DIAGNOSIS — Z9981 Dependence on supplemental oxygen: Secondary | ICD-10-CM

## 2014-11-07 DIAGNOSIS — M6281 Muscle weakness (generalized): Secondary | ICD-10-CM | POA: Diagnosis not present

## 2014-11-07 DIAGNOSIS — Z452 Encounter for adjustment and management of vascular access device: Secondary | ICD-10-CM | POA: Diagnosis not present

## 2014-11-07 DIAGNOSIS — R0902 Hypoxemia: Secondary | ICD-10-CM

## 2014-11-07 DIAGNOSIS — Z91018 Allergy to other foods: Secondary | ICD-10-CM

## 2014-11-07 DIAGNOSIS — J948 Other specified pleural conditions: Secondary | ICD-10-CM | POA: Diagnosis not present

## 2014-11-07 DIAGNOSIS — I509 Heart failure, unspecified: Secondary | ICD-10-CM | POA: Diagnosis not present

## 2014-11-07 DIAGNOSIS — I493 Ventricular premature depolarization: Secondary | ICD-10-CM | POA: Diagnosis present

## 2014-11-07 DIAGNOSIS — I482 Chronic atrial fibrillation: Secondary | ICD-10-CM | POA: Diagnosis not present

## 2014-11-07 DIAGNOSIS — J441 Chronic obstructive pulmonary disease with (acute) exacerbation: Secondary | ICD-10-CM | POA: Diagnosis not present

## 2014-11-07 DIAGNOSIS — Z9119 Patient's noncompliance with other medical treatment and regimen: Secondary | ICD-10-CM | POA: Diagnosis present

## 2014-11-07 DIAGNOSIS — R918 Other nonspecific abnormal finding of lung field: Secondary | ICD-10-CM | POA: Diagnosis not present

## 2014-11-07 DIAGNOSIS — J984 Other disorders of lung: Secondary | ICD-10-CM | POA: Diagnosis not present

## 2014-11-07 DIAGNOSIS — M1990 Unspecified osteoarthritis, unspecified site: Secondary | ICD-10-CM | POA: Diagnosis present

## 2014-11-07 DIAGNOSIS — T501X5A Adverse effect of loop [high-ceiling] diuretics, initial encounter: Secondary | ICD-10-CM | POA: Diagnosis present

## 2014-11-07 DIAGNOSIS — I517 Cardiomegaly: Secondary | ICD-10-CM | POA: Diagnosis not present

## 2014-11-07 DIAGNOSIS — N179 Acute kidney failure, unspecified: Secondary | ICD-10-CM | POA: Diagnosis present

## 2014-11-07 DIAGNOSIS — Z882 Allergy status to sulfonamides status: Secondary | ICD-10-CM

## 2014-11-07 DIAGNOSIS — Z86718 Personal history of other venous thrombosis and embolism: Secondary | ICD-10-CM

## 2014-11-07 DIAGNOSIS — R069 Unspecified abnormalities of breathing: Secondary | ICD-10-CM | POA: Diagnosis not present

## 2014-11-07 LAB — DIGOXIN LEVEL: DIGOXIN LVL: 2.4 ng/mL — AB (ref 0.8–2.0)

## 2014-11-07 LAB — CBC WITH DIFFERENTIAL/PLATELET
Basophils Absolute: 0 10*3/uL (ref 0.0–0.1)
Basophils Relative: 0 % (ref 0–1)
Eosinophils Absolute: 0 10*3/uL (ref 0.0–0.7)
Eosinophils Relative: 0 % (ref 0–5)
HCT: 39.2 % (ref 36.0–46.0)
Hemoglobin: 12.1 g/dL (ref 12.0–15.0)
Lymphocytes Relative: 3 % — ABNORMAL LOW (ref 12–46)
Lymphs Abs: 0.7 10*3/uL (ref 0.7–4.0)
MCH: 31.5 pg (ref 26.0–34.0)
MCHC: 30.9 g/dL (ref 30.0–36.0)
MCV: 102.1 fL — ABNORMAL HIGH (ref 78.0–100.0)
Monocytes Absolute: 2.3 10*3/uL — ABNORMAL HIGH (ref 0.1–1.0)
Monocytes Relative: 11 % (ref 3–12)
Neutro Abs: 17 10*3/uL — ABNORMAL HIGH (ref 1.7–7.7)
Neutrophils Relative %: 86 % — ABNORMAL HIGH (ref 43–77)
Platelets: 296 10*3/uL (ref 150–400)
RBC: 3.84 MIL/uL — ABNORMAL LOW (ref 3.87–5.11)
RDW: 18.7 % — ABNORMAL HIGH (ref 11.5–15.5)
WBC: 20 10*3/uL — ABNORMAL HIGH (ref 4.0–10.5)

## 2014-11-07 LAB — URINALYSIS, ROUTINE W REFLEX MICROSCOPIC
BILIRUBIN URINE: NEGATIVE
GLUCOSE, UA: NEGATIVE mg/dL
Ketones, ur: NEGATIVE mg/dL
Leukocytes, UA: NEGATIVE
Nitrite: NEGATIVE
Protein, ur: 100 mg/dL — AB
Specific Gravity, Urine: 1.026 (ref 1.005–1.030)
Urobilinogen, UA: 1 mg/dL (ref 0.0–1.0)
pH: 5 (ref 5.0–8.0)

## 2014-11-07 LAB — BLOOD GAS, ARTERIAL
Acid-Base Excess: 4.4 mmol/L — ABNORMAL HIGH (ref 0.0–2.0)
Bicarbonate: 31.8 mEq/L — ABNORMAL HIGH (ref 20.0–24.0)
Drawn by: 331471
O2 Content: 4 L/min
O2 Saturation: 96.1 %
Patient temperature: 98.6
TCO2: 29.5 mmol/L (ref 0–100)
pCO2 arterial: 65 mmHg (ref 35.0–45.0)
pH, Arterial: 7.31 — ABNORMAL LOW (ref 7.350–7.450)
pO2, Arterial: 97 mmHg (ref 80.0–100.0)

## 2014-11-07 LAB — COMPREHENSIVE METABOLIC PANEL
ALBUMIN: 3.3 g/dL — AB (ref 3.5–5.0)
ALT: 27 U/L (ref 14–54)
AST: 49 U/L — ABNORMAL HIGH (ref 15–41)
Alkaline Phosphatase: 101 U/L (ref 38–126)
Anion gap: 16 — ABNORMAL HIGH (ref 5–15)
BUN: 31 mg/dL — ABNORMAL HIGH (ref 6–20)
CO2: 30 mmol/L (ref 22–32)
Calcium: 9.5 mg/dL (ref 8.9–10.3)
Chloride: 93 mmol/L — ABNORMAL LOW (ref 101–111)
Creatinine, Ser: 1.09 mg/dL — ABNORMAL HIGH (ref 0.44–1.00)
GFR calc non Af Amer: 44 mL/min — ABNORMAL LOW (ref >60)
GFR, EST AFRICAN AMERICAN: 51 mL/min — AB (ref >60)
Glucose, Bld: 230 mg/dL — ABNORMAL HIGH (ref 65–99)
POTASSIUM: 5.2 mmol/L — AB (ref 3.5–5.1)
Sodium: 139 mmol/L (ref 135–145)
Total Bilirubin: 0.7 mg/dL (ref 0.3–1.2)
Total Protein: 7.5 g/dL (ref 6.5–8.1)

## 2014-11-07 LAB — URINE MICROSCOPIC-ADD ON

## 2014-11-07 LAB — CBG MONITORING, ED: Glucose-Capillary: 224 mg/dL — ABNORMAL HIGH (ref 65–99)

## 2014-11-07 LAB — PROCALCITONIN: Procalcitonin: 1.34 ng/mL

## 2014-11-07 LAB — TROPONIN I
TROPONIN I: 0.11 ng/mL — AB (ref <0.031)
Troponin I: 0.08 ng/mL — ABNORMAL HIGH (ref <0.031)
Troponin I: 0.06 ng/mL — ABNORMAL HIGH (ref <0.031)

## 2014-11-07 LAB — I-STAT CG4 LACTIC ACID, ED
Lactic Acid, Venous: 4.77 mmol/L (ref 0.5–2.0)
Lactic Acid, Venous: 1.98 mmol/L (ref 0.5–2.0)

## 2014-11-07 LAB — LACTIC ACID, PLASMA
LACTIC ACID, VENOUS: 1.9 mmol/L (ref 0.5–2.0)
Lactic Acid, Venous: 1.4 mmol/L (ref 0.5–2.0)

## 2014-11-07 LAB — MRSA PCR SCREENING: MRSA by PCR: NEGATIVE

## 2014-11-07 LAB — BRAIN NATRIURETIC PEPTIDE: B NATRIURETIC PEPTIDE 5: 785.7 pg/mL — AB (ref 0.0–100.0)

## 2014-11-07 MED ORDER — VANCOMYCIN HCL IN DEXTROSE 1-5 GM/200ML-% IV SOLN
1000.0000 mg | Freq: Once | INTRAVENOUS | Status: AC
  Administered 2014-11-07: 1000 mg via INTRAVENOUS
  Filled 2014-11-07: qty 200

## 2014-11-07 MED ORDER — IPRATROPIUM-ALBUTEROL 0.5-2.5 (3) MG/3ML IN SOLN
3.0000 mL | Freq: Four times a day (QID) | RESPIRATORY_TRACT | Status: DC
  Administered 2014-11-07 – 2014-11-09 (×10): 3 mL via RESPIRATORY_TRACT
  Filled 2014-11-07 (×10): qty 3

## 2014-11-07 MED ORDER — ARFORMOTEROL TARTRATE 15 MCG/2ML IN NEBU
15.0000 ug | INHALATION_SOLUTION | Freq: Two times a day (BID) | RESPIRATORY_TRACT | Status: DC
Start: 2014-11-07 — End: 2014-11-20
  Administered 2014-11-07 – 2014-11-20 (×26): 15 ug via RESPIRATORY_TRACT
  Filled 2014-11-07 (×27): qty 2

## 2014-11-07 MED ORDER — VANCOMYCIN HCL 10 G IV SOLR
1250.0000 mg | INTRAVENOUS | Status: DC
  Administered 2014-11-08 – 2014-11-11 (×4): 1250 mg via INTRAVENOUS
  Filled 2014-11-07 (×5): qty 1250

## 2014-11-07 MED ORDER — METHYLPREDNISOLONE SODIUM SUCC 125 MG IJ SOLR: 60.0000 mg | INTRAMUSCULAR | Status: DC

## 2014-11-07 MED ORDER — DEXTROSE 5 % IV SOLN
2.0000 g | Freq: Once | INTRAVENOUS | Status: AC
  Administered 2014-11-07: 2 g via INTRAVENOUS
  Filled 2014-11-07: qty 2

## 2014-11-07 MED ORDER — ALBUTEROL SULFATE (2.5 MG/3ML) 0.083% IN NEBU: 2.5000 mg | INHALATION_SOLUTION | RESPIRATORY_TRACT | Status: DC | PRN

## 2014-11-07 MED ORDER — FUROSEMIDE 10 MG/ML IJ SOLN
60.0000 mg | Freq: Once | INTRAMUSCULAR | Status: AC
  Administered 2014-11-07: 60 mg via INTRAVENOUS
  Filled 2014-11-07: qty 8

## 2014-11-07 MED ORDER — FUROSEMIDE 10 MG/ML IJ SOLN
40.0000 mg | Freq: Two times a day (BID) | INTRAMUSCULAR | Status: DC
  Administered 2014-11-07 – 2014-11-08 (×2): 40 mg via INTRAVENOUS
  Filled 2014-11-07 (×2): qty 4

## 2014-11-07 MED ORDER — BUDESONIDE 0.25 MG/2ML IN SUSP
0.2500 mg | Freq: Four times a day (QID) | RESPIRATORY_TRACT | Status: DC
  Administered 2014-11-07 – 2014-11-12 (×19): 0.25 mg via RESPIRATORY_TRACT
  Filled 2014-11-07 (×19): qty 2

## 2014-11-07 MED ORDER — METOPROLOL TARTRATE 1 MG/ML IV SOLN
2.5000 mg | INTRAVENOUS | Status: DC | PRN
  Administered 2014-11-08 – 2014-11-18 (×4): 2.5 mg via INTRAVENOUS
  Filled 2014-11-07 (×4): qty 5

## 2014-11-07 MED ORDER — DEXTROSE 5 % IV SOLN
2.0000 g | INTRAVENOUS | Status: DC
  Administered 2014-11-08 – 2014-11-09 (×2): 2 g via INTRAVENOUS
  Filled 2014-11-07 (×5): qty 2

## 2014-11-07 NOTE — ED Notes (Signed)
Bed: IE33 Expected date:  Expected time:  Means of arrival:  Comments: EMS- 81 yo shortness of breath

## 2014-11-07 NOTE — H&P (Signed)
History and Physical  Jennifer Nielsen KGY:[id number] DOB: 1924-07-19 DOA: 11/07/2014  Referring physician: EDP PCP: [name], MD   Chief Complaint:  Unresponsive with labored breathing  HPI: Jennifer Nielsen is a 81 y.o. female   With h/o DVT, chronic afib on xarelto, h/o diastolic chf/copd/chronic respiratory failure on home oxygen 2liter, h/o dementia was sent from SNF to [facility] long ED via EMS due to above complaints. Patient with dementia, not able to provide history. HPI obtained from chart review and talking to EDP. Per SNF staff, patient was found to have increased swelling, her lasix dose was increased, however, she became less responsive with labored breathing today, she was sent to the ED. She was found to have wheezing, was given albuterol and put on nonrebreather en route.   ED course: Her respiration rate initially was 36, bp stable, heart rate slightly elevated in the low 100's with afib, cxr with bilateral pleural effusion/infiltration? tmax 100,6 labs showed wbc of 20, lactic acid of 4.7, elevated bnp compare to baseline, mild elevation of troponin, mild hyperkalemia, and elevated cr of 1.09 (baseline 0.49). ABG with co2 retention, ph 7.3, She was put on bipap, given lasix 60mg  iv x1, no documented urine output in the computer. Broad spectrum abx vanc and cefepime ordered. hospitalist service called to admit this patient.  Patient currently on bipap, is awake, reported feeling better. Not able to provide details.  Review of Systems:  Detail per HPI, Review of systems are otherwise negative  Past Medical History  Diagnosis Date  . Arrhythmia   . Arthritis   . Atrial fibrillation     Remains stable (As of 05/09/13)  . H/O blood clots   . Hyperlipemia   . GERD (gastroesophageal reflux disease)     Stable (As of 05/09/13)  . Osteoarthritis     Denies pain (As of 05/09/13)  . CHF (congestive heart failure)     chronic diastolic  . COPD (chronic obstructive pulmonary disease)       w/exacerbation  . Dementia     Remains stable & continues to function adequately in the current living environment with supervision. Has had little changes in behavior (AS OF 05/09/13)   Past Surgical History  Procedure Laterality Date  . Appendectomy    . Gallbladder surgery    . Hip surgery      Right-Metal plate   . Leg surgery      Left leg   Social History:  reports that she has quit smoking. She has never used smokeless tobacco. She reports that she does not drink alcohol or use illicit drugs. Patient lives at [facility] & is able to participate in activities of daily living with wheelchair.  Allergies  Allergen Reactions  . Chocolate Itching  . Ciprofloxacin Itching  . Coffee Bean Extract [Coffea Arabica] Itching  . Coffee Flavor Itching  . Eggs Or Egg-Derived Products   . Gentamycin [Gentamicin] Itching  . Imodium [Loperamide] Other (See Comments)    unknown  . Penicillins Itching  . Sulfa Antibiotics Itching  . Tea Itching    Family History  Problem Relation Age of Onset  . Colon cancer Neg Hx   . Heart disease Father   . Breast cancer Daughter       Prior to Admission medications   Medication Sig Start Date End Date Taking? Authorizing Provider  acetaminophen (TYLENOL) 500 MG tablet Take 500 mg by mouth 3 (three) times daily.    Yes Historical Provider, MD  budesonide-formoterol (SYMBICORT) 160-4.5  MCG/ACT inhaler Inhale 2 puffs into the lungs 2 (two) times daily.   Yes Historical Provider, MD  Calcium Carbonate-Vitamin D (CALTRATE 600+D PO) Take 1 tablet by mouth every morning. For calcium supplement   Yes Historical Provider, MD  digoxin (LANOXIN) 0.25 MG tablet Take 0.25 mg by mouth every other day. Alternate between  0.125 and 0.250   Yes Historical Provider, MD  diltiazem (CARDIZEM CD) 120 MG 24 hr capsule Take 120 mg by mouth daily. For A-Fib 07/02/13  Yes Bobby Rumpf York, PA-C  Docusate Sodium (DSS) 100 MG CAPS Take 100 mg by mouth daily. For constipation  07/02/13  Yes Bobby Rumpf York, PA-C  furosemide (LASIX) 40 MG tablet Take 40 mg by mouth 2 (two) times daily.   Yes Historical Provider, MD  guaiFENesin (MUCINEX) 600 MG 12 hr tablet Take 1 tablet (600 mg total) by mouth 2 (two) times daily. 09/18/14  Yes Bonnielee Haff, MD  ipratropium-albuterol (DUONEB) 0.5-2.5 (3) MG/3ML SOLN 69ml nebulized 4 times daily scheduled and every 4hrs as needed for wheezing for 3 days. And then every 4hours as needed for wheezing. 09/18/14  Yes Bonnielee Haff, MD  Multiple Vitamins-Minerals (DECUBI-VITE) CAPS Take 1 capsule by mouth daily.   Yes Historical Provider, MD  Nutritional Supplements (NUTRITIONAL DRINK PO) Take 120 mLs by mouth 2 (two) times daily. MIGHTY SHAKES   Yes Historical Provider, MD  Polyethyl Glycol-Propyl Glycol (SYSTANE OP) Place 1 drop into both eyes 2 (two) times daily. For dry eyes   Yes Historical Provider, MD  potassium chloride SA (K-DUR,KLOR-CON) 20 MEQ tablet Take 1 tablet (20 mEq total) by mouth daily. 10/12/14  Yes Theodis Blaze, MD  Protein (PROCEL) POWD Take 2 scoop by mouth 2 (two) times daily.   Yes Historical Provider, MD  Rivaroxaban (XARELTO) 20 MG TABS tablet Take 20 mg by mouth daily. For DVT   Yes Historical Provider, MD  azithromycin (ZITHROMAX) 250 MG tablet Take 1 tablet (250 mg total) by mouth daily. Patient not taking: Reported on 11/07/2014 10/12/14   Theodis Blaze, MD  digoxin (LANOXIN) 0.125 MG tablet Take 0.125 mg by mouth every other day.    Historical Provider, MD    Physical Exam: BP 116/59 mmHg  Pulse 105  Temp(Src) 100.6 F (38.1 C) (Rectal)  Resp 27  SpO2 96%  General:  Frail, awake, does attempt to follow commend, but weak. Eyes: PERRL ENT: unremarkable Neck: supple, no JVD Cardiovascular: IRRR Respiratory: diminished, + rales at bases, no obvious wheezes. Abdomen: soft/ND/ND, positive bowel sounds Skin: no rash Musculoskeletal:  No edema Psychiatric: calm/cooperative Neurologic: frail,awake, on bipap,  not moving lower extremity , reported chronic at baseline.          Labs on Admission:  Basic Metabolic Panel:  Recent Labs Lab 11/07/14 0845  NA 139  K 5.2*  CL 93*  CO2 30  GLUCOSE 230*  BUN 31*  CREATININE 1.09*  CALCIUM 9.5   Liver Function Tests:  Recent Labs Lab 11/07/14 0845  AST 49*  ALT 27  ALKPHOS 101  BILITOT 0.7  PROT 7.5  ALBUMIN 3.3*   No results for input(s): LIPASE, AMYLASE in the last 168 hours. No results for input(s): AMMONIA in the last 168 hours. CBC:  Recent Labs Lab 11/07/14 0845  WBC 20.0*  NEUTROABS 17.0*  HGB 12.1  HCT 39.2  MCV 102.1*  PLT 296   Cardiac Enzymes:  Recent Labs Lab 11/07/14 0845  TROPONINI 0.11*    BNP (last 3  results)  Recent Labs  10/09/14 1404 10/09/14 1753 11/07/14 0845  BNP 248.2* 309.6* 785.7*    ProBNP (last 3 results)  Recent Labs  02/22/14 1417 06/17/14 1050  PROBNP 2244.0* 170.0*    CBG:  Recent Labs Lab 11/07/14 0841  GLUCAP 224*    Radiological Exams on Admission: Dg Chest 2 View  11/07/2014   CLINICAL DATA:  Shortness breath. Under ext bouts of with labored breathing. Transfer from skilled nursing facility.  EXAM: CHEST - 2 VIEW  COMPARISON:  CT of the chest 10/12/2014 and chest x-ray 10/11/2014.  FINDINGS: Heart is enlarged. Bilateral pleural effusions have increased, left greater than right. Mild edema is noted. Bibasilar airspace opacities are present. Degenerative changes are again noted in both shoulders.  IMPRESSION: 1. Increased interstitial edema and bilateral pleural effusions compatible with congestive heart failure. 2. Bibasilar airspace disease likely reflects atelectasis.   Electronically Signed   By: San Morelle M.D.   On: 11/07/2014 10:46    EKG: Independently reviewed. Chronic afib, no acute st/t changes  Assessment/Plan Present on Admission:  . Respiratory failure . Acute on chronic respiratory failure  Acute on chronic respiratory failure: likely  combination of diastolic CHF exacerbation and HCAP./copd excerbation Admit to stepdown, continue bipap, vanc/cefepime. lasix iv 40mg  bid, strict intake and output. Duoneb/solumedrol.  Pleural effusion: on lasix, pccm consulted for possible thoracentesis, xarelto on hold.  Sepsis with fever of 100.6, leukocytosis wbc 20, lactic acidosis, on vanc/cefepime, avoid ivf due to chf.  Elevation of troponin, likely demand ischemia in the setting of sepsis and chf exacerbation, patient denies chest pain, ekg no actue st/t changes, most recent echo was in 08/2014. Will not repeat echo,  Will keep on tele, continue cycle enzymes. Currently patient is npo due to bipap.  ARF: likely multifactorial, in the setting of sepsis and chf, does has bacteruria, blood culture/urine culture pending. On abx. Renal dosing meds. Foley to monitor urine output.  Mild hyperkalemia: k 5.2, hold digoxin, continue treat k.    Chronic afib,heart rate largely controlled. Hold digoxin as mentioned above. Prn iv low dose lopressor with holding parameters. Hold xarelto for possible thoracentesis.  Dementia: long term snf resident. upon chart review, this is the 4th time this year that this patient is being admitted to the hospital for similar presentation. If patient fail to improved with above measures, likely will need palliative care involvement and GOC discussion with family  DVT prophylaxis with SCD's.  Consultants:  Pccm(talked to Dr. Nelda Marseille)  Code Status: DNR/DNI  Family Communication:  None at bedside  Disposition Plan: admit to stepdown  Time spent: >65mins  Cloyd Ragas MD, PhD [facility] Pager [phone] If 7PM-7AM, please contact night-coverage at [email or web address], password [facility]

## 2014-11-07 NOTE — ED Provider Notes (Signed)
CSN: [id number]     Arrival date & time 11/07/14  0086 History   First MD Initiated Contact with Patient 11/07/14 [phone]     Chief Complaint  Patient presents with  . Shortness of Breath     Level V caveat: Altered mental status  The history is provided by the patient, medical records, the nursing home and a relative.   patient is brought to the emergency department from the nursing home for what was described as unresponsiveness with increased work of breathing.  Patient has a history of congestive heart failure and intermittent recent admissions for respiratory failure.  Patient presents slightly confused per the daughter with increased respiratory rate.  Last hospitalization patient underwent palliative care consultation which at that time demonstrated DO NOT RESUSCITATE but otherwise aggressive ongoing medical management.  Family reports the patient still has some quality of life and enjoys bingo at the nursing home.  Reports a recent productive cough.  No reported recent vomiting or diarrhea.  No rash.  Family reports the patient is noncompliant with her 2 L nasal cannula  Past Medical History  Diagnosis Date  . Arrhythmia   . Arthritis   . Atrial fibrillation     Remains stable (As of 05/09/13)  . H/O blood clots   . Hyperlipemia   . GERD (gastroesophageal reflux disease)     Stable (As of 05/09/13)  . Osteoarthritis     Denies pain (As of 05/09/13)  . CHF (congestive heart failure)     chronic diastolic  . COPD (chronic obstructive pulmonary disease)     w/exacerbation  . Dementia     Remains stable & continues to function adequately in the current living environment with supervision. Has had little changes in behavior (AS OF 05/09/13)   Past Surgical History  Procedure Laterality Date  . Appendectomy    . Gallbladder surgery    . Hip surgery      Right-Metal plate   . Leg surgery      Left leg   Family History  Problem Relation Age of Onset  . Colon cancer Neg Hx   .  Heart disease Father   . Breast cancer Daughter    History  Substance Use Topics  . Smoking status: Former Research scientist (life sciences)  . Smokeless tobacco: Never Used  . Alcohol Use: No   OB History    No data available     Review of Systems  Unable to perform ROS     Allergies  Chocolate; Ciprofloxacin; Coffee bean extract; Coffee flavor; Eggs or egg-derived products; Gentamycin; Imodium; Penicillins; Sulfa antibiotics; and Tea  Home Medications   Prior to Admission medications   Medication Sig Start Date End Date Taking? Authorizing Provider  acetaminophen (TYLENOL) 500 MG tablet Take 500 mg by mouth 3 (three) times daily.     Historical Provider, MD  azithromycin (ZITHROMAX) 250 MG tablet Take 1 tablet (250 mg total) by mouth daily. 10/12/14   Theodis Blaze, MD  budesonide-formoterol (SYMBICORT) 160-4.5 MCG/ACT inhaler Inhale 2 puffs into the lungs 2 (two) times daily.    Historical Provider, MD  Calcium Carbonate-Vitamin D (CALTRATE 600+D PO) Take 1 tablet by mouth every morning. For calcium supplement    Historical Provider, MD  digoxin (LANOXIN) 0.125 MG tablet Take 0.125 mg by mouth every other day.    Historical Provider, MD  digoxin (LANOXIN) 0.25 MG tablet Take 0.25 mg by mouth every other day. Alternate between  0.125 and 0.250    Historical  Provider, MD  diltiazem (CARDIZEM CD) 120 MG 24 hr capsule Take 120 mg by mouth daily. For A-Fib 07/02/13   Melton Alar, PA-C  Docusate Sodium (DSS) 100 MG CAPS Take 100 mg by mouth daily. For constipation 07/02/13   Melton Alar, PA-C  furosemide (LASIX) 40 MG tablet Take 40 mg by mouth 2 (two) times daily.    Historical Provider, MD  guaiFENesin (MUCINEX) 600 MG 12 hr tablet Take 1 tablet (600 mg total) by mouth 2 (two) times daily. 09/18/14   Bonnielee Haff, MD  ipratropium-albuterol (DUONEB) 0.5-2.5 (3) MG/3ML SOLN 52ml nebulized 4 times daily scheduled and every 4hrs as needed for wheezing for 3 days. And then every 4hours as needed for wheezing.  09/18/14   Bonnielee Haff, MD  Multiple Vitamins-Minerals (DECUBI-VITE) CAPS Take 1 capsule by mouth daily.    Historical Provider, MD  Nutritional Supplements (NUTRITIONAL DRINK PO) Take 120 mLs by mouth 2 (two) times daily. MIGHTY SHAKES    Historical Provider, MD  Polyethyl Glycol-Propyl Glycol (SYSTANE OP) Place 1 drop into both eyes 2 (two) times daily. For dry eyes    Historical Provider, MD  potassium chloride SA (K-DUR,KLOR-CON) 20 MEQ tablet Take 1 tablet (20 mEq total) by mouth daily. 10/12/14   Theodis Blaze, MD  Protein (PROCEL) POWD Take 2 scoop by mouth 2 (two) times daily.    Historical Provider, MD  Rivaroxaban (XARELTO) 20 MG TABS tablet Take 20 mg by mouth daily. For DVT    Historical Provider, MD   There were no vitals taken for this visit. Physical Exam  Constitutional: She appears well-developed and well-nourished.  Chronically ill-appearing  HENT:  Head: Normocephalic and atraumatic.  Eyes: EOM are normal.  Neck: Normal range of motion.  Cardiovascular: Regular rhythm and normal heart sounds.   Tachycardic  Pulmonary/Chest:  Tachypnea with accessory muscle use.  Rhonchi and rales present.  Abdominal: Soft. She exhibits no distension. There is no tenderness.  Musculoskeletal: Normal range of motion.  Neurological: She is alert.  Follow simple commands  Skin: Skin is warm and dry.  Psychiatric: She has a normal mood and affect. Judgment normal.  Nursing note and vitals reviewed.   ED Course  Procedures (including critical care time)  CRITICAL CARE Performed by: Hoy Morn Total critical care time: 35 Critical care time was exclusive of separately billable procedures and treating other patients. Critical care was necessary to treat or prevent imminent or life-threatening deterioration. Critical care was time spent personally by me on the following activities: development of treatment plan with patient and/or surrogate as well as nursing, discussions with  consultants, evaluation of patient's response to treatment, examination of patient, obtaining history from patient or surrogate, ordering and performing treatments and interventions, ordering and review of laboratory studies, ordering and review of radiographic studies, pulse oximetry and re-evaluation of patient's condition.   Labs Review Labs Reviewed  CBC WITH DIFFERENTIAL/PLATELET - Abnormal; Notable for the following:    WBC 20.0 (*)    RBC 3.84 (*)    MCV 102.1 (*)    RDW 18.7 (*)    Neutrophils Relative % 86 (*)    Neutro Abs 17.0 (*)    Lymphocytes Relative 3 (*)    Monocytes Absolute 2.3 (*)    All other components within normal limits  COMPREHENSIVE METABOLIC PANEL - Abnormal; Notable for the following:    Potassium 5.2 (*)    Chloride 93 (*)    Glucose, Bld 230 (*)  BUN 31 (*)    Creatinine, Ser 1.09 (*)    Albumin 3.3 (*)    AST 49 (*)    GFR calc non Af Amer 44 (*)    GFR calc Af Amer 51 (*)    Anion gap 16 (*)    All other components within normal limits  TROPONIN I - Abnormal; Notable for the following:    Troponin I 0.11 (*)    All other components within normal limits  BRAIN NATRIURETIC PEPTIDE - Abnormal; Notable for the following:    B Natriuretic Peptide 785.7 (*)    All other components within normal limits  BLOOD GAS, ARTERIAL - Abnormal; Notable for the following:    pH, Arterial 7.310 (*)    pCO2 arterial 65.0 (*)    Bicarbonate 31.8 (*)    Acid-Base Excess 4.4 (*)    All other components within normal limits  URINALYSIS, ROUTINE W REFLEX MICROSCOPIC (NOT AT [facility]) - Abnormal; Notable for the following:    Color, Urine AMBER (*)    APPearance CLOUDY (*)    Hgb urine dipstick SMALL (*)    Protein, ur 100 (*)    All other components within normal limits  DIGOXIN LEVEL - Abnormal; Notable for the following:    Digoxin Level 2.4 (*)    All other components within normal limits  URINE MICROSCOPIC-ADD ON - Abnormal; Notable for the following:     Bacteria, UA MANY (*)    Casts HYALINE CASTS (*)    All other components within normal limits  CBG MONITORING, ED - Abnormal; Notable for the following:    Glucose-Capillary 224 (*)    All other components within normal limits  I-STAT CG4 LACTIC ACID, ED - Abnormal; Notable for the following:    Lactic Acid, Venous 4.77 (*)    All other components within normal limits  MRSA PCR SCREENING  CULTURE, BLOOD (ROUTINE X 2)  CULTURE, BLOOD (ROUTINE X 2)  LACTIC ACID, PLASMA  PROCALCITONIN  TROPONIN I  TROPONIN I  LACTIC ACID, PLASMA  I-STAT CG4 LACTIC ACID, ED    Imaging Review Dg Chest 2 View  11/07/2014   CLINICAL DATA:  Shortness breath. Under ext bouts of with labored breathing. Transfer from skilled nursing facility.  EXAM: CHEST - 2 VIEW  COMPARISON:  CT of the chest 10/12/2014 and chest x-ray 10/11/2014.  FINDINGS: Heart is enlarged. Bilateral pleural effusions have increased, left greater than right. Mild edema is noted. Bibasilar airspace opacities are present. Degenerative changes are again noted in both shoulders.  IMPRESSION: 1. Increased interstitial edema and bilateral pleural effusions compatible with congestive heart failure. 2. Bibasilar airspace disease likely reflects atelectasis.   Electronically Signed   By: San Morelle M.D.   On: 11/07/2014 10:46      MDM   Final diagnoses:  None    Patient with acute on chronic respiratory failure.  Initially patient was placed on increased level of oxygen however she had ongoing work of breathing.  She was started on BiPAP.  Family is agreeable to BiPAP.  Given her leukocytosis and her low-grade temp of 100.6 patient be started on broad-spectrum antibiotics of vancomycin and cefepime.  Patient be admitted to the stepdown unit.  Lactate noted.  We'll continue to follow closely.  She does plan to potentially worsen during this hospitalization.  She does not want to be intubated.  She has advanced comorbidities and I think this  is a reasonable approach that the family is taking.  She may require repeat palliative care consultation    Jola Schmidt, MD 11/07/14 [phone]

## 2014-11-07 NOTE — ED Notes (Signed)
Patient transported to X-ray 

## 2014-11-07 NOTE — Progress Notes (Signed)
Rt placed pt on BIPAP in ED WA24 per MD order.

## 2014-11-07 NOTE — ED Notes (Signed)
Lactic acid result given to Dr. Campos 

## 2014-11-07 NOTE — Clinical Social Work Note (Signed)
Clinical Social Work Assessment  Patient Details  Name: Jennifer Nielsen MRN: [id number] Date of Birth: Oct 22, 1924  Date of referral:  11/07/14               Reason for consult:  Facility Placement, Discharge Planning                Permission sought to share information with:    Permission granted to share information::     Name::        Agency::     Relationship::     Contact Information:     Housing/Transportation Living arrangements for the past 2 months:  [place] of Information:  Adult Children Patient Interpreter Needed:  None Criminal Activity/Legal Involvement Pertinent to Current Situation/Hospitalization:  No - Comment as needed Significant Relationships:  Adult Children Lives with:  Facility Resident Do you feel safe going back to the place where you live?  Yes Need for family participation in patient care:  Yes (Comment)  Care giving concerns:  No concerns expressed at this time.   Social Worker assessment / plan:  Pt hospitalized on 11/07/2014 with acute on chronic respiratory failure. CSW consulted for d/c planning. PN notes reviewed, met with pt's daughter. Pt presently sleeping. Daughter reports pt has been a resident of [facility] for about 4 yrs. Daughter plans for pt to return to [facility] following hospital d/c. SNF has been contacted and clinicals sent. SNF will readmit when stable for d/c. CSW will continue to follow to assist with d/c planning.  Employment status:  Retired Forensic scientist:  Medicare PT Recommendations:  Not assessed at this time [place] / Referral to community resources:  Other (Comment Required) (Not required at this time.)  Patient/Family's Response to care: Daughter is satisfied with care received at [facility] and presently at [facility].  Patient/Family's Understanding of and Emotional Response to Diagnosis, Current Treatment, and Prognosis: Pt has been recently hospitalized for similar dx's. Daughter is  hopeful that pt's condition will improve.  Emotional Assessment Appearance:  Appears stated age Attitude/Demeanor/Rapport:  Unable to Assess Affect (typically observed):  Unable to Assess Orientation:  Oriented to Place, Oriented to Self Alcohol / Substance use:  Not Applicable Psych involvement (Current and /or in the community):  No (Comment)  Discharge Needs  Concerns to be addressed:  Discharge Planning Concerns Readmission within the last 30 days:  Yes Current discharge risk:  None Barriers to Discharge:  No Barriers Identified   Amoy Steeves, Randall An, LCSW 11/07/2014, 3:44 PM

## 2014-11-07 NOTE — ED Notes (Addendum)
Pt from [facility].  Staff reported to EMS that pt was unresponsive with labored breathing.  Also reported pt has had increased swelling so they increased her diuretics.  Per ems, RUL/RLL had exp wheezes, gave albuterol 5mg .  Initially 92% on 2L/[place] which is on all the time.  On 100% NRB sats 95%.  CBG 225, RR36, 146/73, HR100afib. Pt has DNR sheet with her.

## 2014-11-07 NOTE — Progress Notes (Signed)
61 CM spoke with Dr Reynaldo Minium Pt determination entered in [facility]

## 2014-11-07 NOTE — Consult Note (Signed)
Name: Jennifer Nielsen MRN: [id number] DOB: December 19, 1924    ADMISSION DATE:  11/07/2014 CONSULTATION DATE:  11/07/2014  REFERRING MD :  Erlinda Hong  CHIEF COMPLAINT:  AMS, respiratory distress  BRIEF PATIENT DESCRIPTION: 81 y.o. F with multiple recent admissions brought to [facility] ED 5/26 for AMS and respiratory distress.  Found to have HCAP and bilateral pleural effusions. PCCM consulted for consideration thoracentesis.  SIGNIFICANT EVENTS  5/26 - admit 5/27 - planned thora by PCCM  STUDIES:  CXR 5/26 >>> increased interstitial edema and b/l pleural effusions, bibasilar atx.   HISTORY OF PRESENT ILLNESS:  Jennifer Nielsen is a 81 y.o. F with PMH as outlined below.  She resides in a SNF and was sent to [facility] ED on 5/26 for labored breathing with decrease in level of responsiveness.  She does have dementia at baseline so unsure what her mental status is on a good day.   In ED, CXR revealed bilateral pleural effusions and possible infiltration.  Labs suggestive of infectious process therefore pt admitted and treated for HCAP.  She was also started on BiPAP for hypercarbic respiratory failure.  PCCM was consulted for consideration of thoracentesis.   Of note, she has had multiple admissions to the hospital recently and in April 2016, had left sided thoracentesis done by IR.  She had 766ml of serous fluid removed - cytology with atypical cells favored to mesothelial in origin (she was admitted for PNA, AoC hypoxic and hypercapnic respiratory failure and acute encephalopathy).   PAST MEDICAL HISTORY :   has a past medical history of Arrhythmia; Arthritis; Atrial fibrillation; H/O blood clots; Hyperlipemia; GERD (gastroesophageal reflux disease); Osteoarthritis; CHF (congestive heart failure); COPD (chronic obstructive pulmonary disease); and Dementia.  has past surgical history that includes Appendectomy; Gallbladder surgery; Hip surgery; and Leg Surgery. Prior to Admission medications   Medication Sig Start Date End  Date Taking? Authorizing Provider  acetaminophen (TYLENOL) 500 MG tablet Take 500 mg by mouth 3 (three) times daily.    Yes Historical Provider, MD  budesonide-formoterol (SYMBICORT) 160-4.5 MCG/ACT inhaler Inhale 2 puffs into the lungs 2 (two) times daily.   Yes Historical Provider, MD  Calcium Carbonate-Vitamin D (CALTRATE 600+D PO) Take 1 tablet by mouth every morning. For calcium supplement   Yes Historical Provider, MD  digoxin (LANOXIN) 0.25 MG tablet Take 0.25 mg by mouth every other day. Alternate between  0.125 and 0.250   Yes Historical Provider, MD  diltiazem (CARDIZEM CD) 120 MG 24 hr capsule Take 120 mg by mouth daily. For A-Fib 07/02/13  Yes Bobby Rumpf York, PA-C  Docusate Sodium (DSS) 100 MG CAPS Take 100 mg by mouth daily. For constipation 07/02/13  Yes Bobby Rumpf York, PA-C  furosemide (LASIX) 40 MG tablet Take 40 mg by mouth 2 (two) times daily.   Yes Historical Provider, MD  guaiFENesin (MUCINEX) 600 MG 12 hr tablet Take 1 tablet (600 mg total) by mouth 2 (two) times daily. 09/18/14  Yes Bonnielee Haff, MD  ipratropium-albuterol (DUONEB) 0.5-2.5 (3) MG/3ML SOLN 6ml nebulized 4 times daily scheduled and every 4hrs as needed for wheezing for 3 days. And then every 4hours as needed for wheezing. 09/18/14  Yes Bonnielee Haff, MD  Multiple Vitamins-Minerals (DECUBI-VITE) CAPS Take 1 capsule by mouth daily.   Yes Historical Provider, MD  Nutritional Supplements (NUTRITIONAL DRINK PO) Take 120 mLs by mouth 2 (two) times daily. MIGHTY SHAKES   Yes Historical Provider, MD  Polyethyl Glycol-Propyl Glycol (SYSTANE OP) Place 1 drop into both eyes 2 (two)  times daily. For dry eyes   Yes Historical Provider, MD  potassium chloride SA (K-DUR,KLOR-CON) 20 MEQ tablet Take 1 tablet (20 mEq total) by mouth daily. 10/12/14  Yes Theodis Blaze, MD  Protein (PROCEL) POWD Take 2 scoop by mouth 2 (two) times daily.   Yes Historical Provider, MD  Rivaroxaban (XARELTO) 20 MG TABS tablet Take 20 mg by mouth daily.  For DVT   Yes Historical Provider, MD  azithromycin (ZITHROMAX) 250 MG tablet Take 1 tablet (250 mg total) by mouth daily. Patient not taking: Reported on 11/07/2014 10/12/14   Theodis Blaze, MD  digoxin (LANOXIN) 0.125 MG tablet Take 0.125 mg by mouth every other day.    Historical Provider, MD   Allergies  Allergen Reactions  . Chocolate Itching  . Ciprofloxacin Itching  . Coffee Bean Extract [Coffea Arabica] Itching  . Coffee Flavor Itching  . Eggs Or Egg-Derived Products   . Gentamycin [Gentamicin] Itching  . Imodium [Loperamide] Other (See Comments)    unknown  . Penicillins Itching  . Sulfa Antibiotics Itching  . Tea Itching    FAMILY HISTORY:  family history includes Breast cancer in her daughter; Heart disease in her father. There is no history of Colon cancer. SOCIAL HISTORY:  reports that she has quit smoking. She has never used smokeless tobacco. She reports that she does not drink alcohol or use illicit drugs.  REVIEW OF SYSTEMS:   Unable to obtain as pt is encephalopathic.   SUBJECTIVE:   VITAL SIGNS: Temp:  [100.6 F (38.1 C)] 100.6 F (38.1 C) (05/26 1003) Pulse Rate:  [73-105] 73 (05/26 1400) Resp:  [27-34] 32 (05/26 1400) BP: (115-159)/(54-71) 115/54 mmHg (05/26 1400) SpO2:  [94 %-99 %] 99 % (05/26 1400) Weight:  [85.7 kg (188 lb 15 oz)] 85.7 kg (188 lb 15 oz) (05/26 1348)  PHYSICAL EXAMINATION: General: Chronically ill appearing elderly female, resting in bed, in NAD. Neuro: Somnolent, confused. HEENT: /AT. PERRL, sclerae anicteric. Cardiovascular: IRIR, no M/R/G.  Lungs: Respirations shallow and slow.  Diminished in bases with faint crackles. Abdomen: BS x 4, soft, NT/ND.  Musculoskeletal: No gross deformities, no edema.  Skin: Intact, warm, no rashes.     Recent Labs Lab 11/07/14 0845  NA 139  K 5.2*  CL 93*  CO2 30  BUN 31*  CREATININE 1.09*  GLUCOSE 230*    Recent Labs Lab 11/07/14 0845  HGB 12.1  HCT 39.2  WBC 20.0*  PLT  296   Dg Chest 2 View  11/07/2014   CLINICAL DATA:  Shortness breath. Under ext bouts of with labored breathing. Transfer from skilled nursing facility.  EXAM: CHEST - 2 VIEW  COMPARISON:  CT of the chest 10/12/2014 and chest x-ray 10/11/2014.  FINDINGS: Heart is enlarged. Bilateral pleural effusions have increased, left greater than right. Mild edema is noted. Bibasilar airspace opacities are present. Degenerative changes are again noted in both shoulders.  IMPRESSION: 1. Increased interstitial edema and bilateral pleural effusions compatible with congestive heart failure. 2. Bibasilar airspace disease likely reflects atelectasis.   Electronically Signed   By: San Morelle M.D.   On: 11/07/2014 10:46    ASSESSMENT / PLAN:  Sepsis - likely due to HCAP Plan: Continue empiric abx. Follow cultures.  Acute on chronic hypoxic and hypercarbic respiratory failure Bilateral pleural effusions HCAP Reported COPD (no PFT's) Plan: Continue BiPAP - 4 hours on, 1 hour off. Hold xarelto in AM 5/27. Plan for diagnostic and therapeutic thoracentesis 5/27 AM - would  favor transudative effusions due to CHF. Diuresis as BP and SCr permit. Continue DuoNebs / Albuterol. Budesonide / Arformoterol in lieue of outpatient symbicort. No role systemic steroids as no bronchospasm at all on exam. Continue empiric abx for possible HCAP. Check PCT - if low then could consider d/c abx. Would not obtain PFT's in this elderly pt with dementia. Pulmonary hygiene.  A.fib - on xarelto, diltiazem, digoxin dCHF Plan: Hold xarelto in anticipation of thoracentesis 5/27 AM. Lopressor PRN. Holding digoxin due to mild hyperkalemia Diurese as BP and SCr permit.  Rest per primary.   Montey Hora, [facility] Pager: [phone]  or [phone] 11/07/2014, 2:15 PM  Attending note:  81 year old female with extensive PMH to include dementia, COPD and CHF who was  found unresponsive and in respiratory failure. Patient is DNR appropriately but was placed on BiPAP and PCCM was called for respiratory failure and pleural effusion.  I reviewed CXR myself, pulmonary edema and left sided pleural effusion noted.  Case discussed with primary MD and PCCM-NP.  Acute on chronic hypoxemic respiratory failure: due to pulmonary edema. - BiPAP for respiratory support. - No need to repeat ABG. - Would recommend calling palliative care.  - D/C solumedrol.  Hypoxemia: - Supplemental O2. - Titrate O2 for sat of 88-92%.  Acute pulmonary edema: due to CHF and fluid overload. - Diureses as able. - Monitor renal function and BP.  Pleural effusion: likely related to CHF rather than infection. - Diureses. - Bedside US in AM. - Hold eliquis for potential thora in AM depending on the U/S findings.  HCAP: recent admission and lives in SNF. - Pan cultures as ordered. - Cefepime and vanc. - F/U on culture and narrow abx as able.  Patient seen and examined, agree with above note. I dictated the care and orders written for this patient under my direction.  Rush Farmer, MD [phone]

## 2014-11-07 NOTE — Progress Notes (Addendum)
ANTIBIOTIC CONSULT NOTE - INITIAL  Pharmacy Consult for Cefepime, Vancomycin Indication: sepsis, HCAP  Allergies  Allergen Reactions  . Chocolate Itching  . Ciprofloxacin Itching  . Coffee Bean Extract [Coffea Arabica] Itching  . Coffee Flavor Itching  . Eggs Or Egg-Derived Products   . Gentamycin [Gentamicin] Itching  . Imodium [Loperamide] Other (See Comments)    unknown  . Penicillins Itching  . Sulfa Antibiotics Itching  . Tea Itching    Patient Measurements: Height: 5\' 7"  (170.2 cm) Weight: 188 lb 15 oz (85.7 kg) IBW/kg (Calculated) : 61.6   Vital Signs: Temp: 100.6 F (38.1 C) (05/26 1003) Temp Source: Rectal (05/26 1003) BP: 115/54 mmHg (05/26 1400) Pulse Rate: 73 (05/26 1400) Intake/Output from previous day:   Intake/Output from this shift: Total I/O In: 200 [IV Piggyback:200] Out: -   Labs:  Recent Labs  11/07/14 0845  WBC 20.0*  HGB 12.1  PLT 296  CREATININE 1.09*   Estimated Creatinine Clearance: 40.1 mL/min (by C-G formula based on Cr of 1.09). No results for input(s): VANCOTROUGH, VANCOPEAK, VANCORANDOM, GENTTROUGH, GENTPEAK, GENTRANDOM, TOBRATROUGH, TOBRAPEAK, TOBRARND, AMIKACINPEAK, AMIKACINTROU, AMIKACIN in the last 72 hours.   Microbiology: Recent Results (from the past 720 hour(s))  Culture, blood (routine x 2) Call MD if unable to obtain prior to antibiotics being given     Status: None   Collection Time: 10/09/14  3:50 PM  Result Value Ref Range Status   Specimen Description BLOOD RIGHT ANTECUBITAL  Final   Special Requests BOTTLES DRAWN AEROBIC AND ANAEROBIC 5CC EACH  Final   Culture   Final    NO GROWTH 5 DAYS Performed at [facility]    Report Status 10/15/2014 FINAL  Final  Culture, blood (routine x 2) Call MD if unable to obtain prior to antibiotics being given     Status: None   Collection Time: 10/09/14  3:50 PM  Result Value Ref Range Status   Specimen Description BLOOD RIGHT ARM  Final   Special Requests BOTTLES  DRAWN AEROBIC AND ANAEROBIC 5CC EACH  Final   Culture   Final    NO GROWTH 5 DAYS Performed at [facility]    Report Status 10/15/2014 FINAL  Final  Body fluid culture     Status: None   Collection Time: 10/10/14 11:21 AM  Result Value Ref Range Status   Specimen Description PLEURAL  Final   Special Requests NONE  Final   Gram Stain   Final    NO WBC SEEN NO ORGANISMS SEEN Performed at [facility]    Culture   Final    NO GROWTH 3 DAYS Performed at [facility]    Report Status 10/13/2014 FINAL  Final    Medical History: Past Medical History  Diagnosis Date  . Arrhythmia   . Arthritis   . Atrial fibrillation     Remains stable (As of 05/09/13)  . H/O blood clots   . Hyperlipemia   . GERD (gastroesophageal reflux disease)     Stable (As of 05/09/13)  . Osteoarthritis     Denies pain (As of 05/09/13)  . CHF (congestive heart failure)     chronic diastolic  . COPD (chronic obstructive pulmonary disease)     w/exacerbation  . Dementia     Remains stable & continues to function adequately in the current living environment with supervision. Has had little changes in behavior (AS OF 05/09/13)    Assessment: 36 y/oF with PMH of a-fib, diastolic  CHF, COPD, chronic respiratory failure on home O2, dementia who was sent from SNF due to unresponsiveness and labored breathing. In ED, HR slightly elevated, temp of 100.18F, leukocytosis, and elevated lactic acid. Pharmacy consulted to assist with dosing of Cefepime and Vancomycin for sepsis, PNA.  5/26 >> Vancomycin >> 5/26 >> Cefepime >>    5/26 blood x 2: sent 5/26 MRSA PCR: sent  SCr 1.09, CrCl ~ 40 mL/min Lactic Acid: 4.77 > 1.98 WBC elevated at 20 K  Goal of Therapy:  Vancomycin trough level 15-20 mcg/mL Appropriate antibiotic dosing for renal function and indication Eradication of infection  Plan:   Cefepime 2g IV q24h.  Vancomycin 1g IV x 1 given in ED. Continue with Vancomycin 1250 mg  IV q24h.  Plan for Vancomycin trough level at steady state.  Monitor renal function, cultures, clinical course.   Lindell Spar, PharmD, BCPS Pager: [phone] 11/07/2014 2:28 PM

## 2014-11-07 NOTE — Progress Notes (Deleted)
Attending note:  81 year old female with extensive PMH to include dementia, COPD and CHF who was found unresponsive and in respiratory failure.  Patient is DNR appropriately but was placed on BiPAP and PCCM was called for respiratory failure and pleural effusion.  I reviewed CXR myself, pulmonary edema and left sided pleural effusion noted.  Case discussed with primary MD and PCCM-NP.  Acute on chronic hypoxemic respiratory failure: due to pulmonary edema.  - BiPAP for respiratory support.  - No need to repeat ABG.  - Would recommend calling palliative care.  Hypoxemia:  - Supplemental O2.  - Titrate O2 for sat of 88-92%.  Acute pulmonary edema: due to CHF and fluid overload.  - Diureses as able.  - Monitor renal function and BP.  Pleural effusion: likely related to CHF rather than infection.  - Diureses.  - Bedside US in AM.  - Hold eliquis for potential thora in AM depending on the U/S findings.  HCAP: recent admission and lives in SNF.  - Pan cultures as ordered.  - Cefepime and vanc.  - F/U on culture and narrow abx as able.  Patient seen and examined, agree with above note.  I dictated the care and orders written for this patient under my direction.  Rush Farmer, MD [phone]

## 2014-11-08 DIAGNOSIS — A419 Sepsis, unspecified organism: Principal | ICD-10-CM

## 2014-11-08 DIAGNOSIS — J189 Pneumonia, unspecified organism: Secondary | ICD-10-CM

## 2014-11-08 DIAGNOSIS — I509 Heart failure, unspecified: Secondary | ICD-10-CM | POA: Insufficient documentation

## 2014-11-08 LAB — COMPREHENSIVE METABOLIC PANEL
ALK PHOS: 65 U/L (ref 38–126)
ALT: 22 U/L (ref 14–54)
AST: 29 U/L (ref 15–41)
Albumin: 2.6 g/dL — ABNORMAL LOW (ref 3.5–5.0)
Anion gap: 10 (ref 5–15)
BUN: 32 mg/dL — AB (ref 6–20)
CHLORIDE: 96 mmol/L — AB (ref 101–111)
CO2: 36 mmol/L — ABNORMAL HIGH (ref 22–32)
Calcium: 8.7 mg/dL — ABNORMAL LOW (ref 8.9–10.3)
Creatinine, Ser: 0.72 mg/dL (ref 0.44–1.00)
GFR calc non Af Amer: >60 mL/min (ref >60)
Glucose, Bld: 106 mg/dL — ABNORMAL HIGH (ref 65–99)
Potassium: 4.2 mmol/L (ref 3.5–5.1)
SODIUM: 142 mmol/L (ref 135–145)
Total Bilirubin: 0.8 mg/dL (ref 0.3–1.2)
Total Protein: 6.2 g/dL — ABNORMAL LOW (ref 6.5–8.1)

## 2014-11-08 LAB — PROTIME-INR
INR: 1.37 (ref 0.00–1.49)
Prothrombin Time: 17 seconds — ABNORMAL HIGH (ref 11.6–15.2)

## 2014-11-08 LAB — CBC
HCT: 32.7 % — ABNORMAL LOW (ref 36.0–46.0)
HEMOGLOBIN: 9.8 g/dL — AB (ref 12.0–15.0)
MCH: 30.2 pg (ref 26.0–34.0)
MCHC: 30 g/dL (ref 30.0–36.0)
MCV: 100.6 fL — AB (ref 78.0–100.0)
Platelets: 208 10*3/uL (ref 150–400)
RBC: 3.25 MIL/uL — AB (ref 3.87–5.11)
RDW: 18.2 % — AB (ref 11.5–15.5)
WBC: 11 10*3/uL — AB (ref 4.0–10.5)

## 2014-11-08 MED ORDER — RIVAROXABAN 20 MG PO TABS
20.0000 mg | ORAL_TABLET | Freq: Every day | ORAL | Status: DC
  Administered 2014-11-08 – 2014-11-20 (×12): 20 mg via ORAL
  Filled 2014-11-08 (×13): qty 1

## 2014-11-08 MED ORDER — FUROSEMIDE 10 MG/ML IJ SOLN
60.0000 mg | Freq: Two times a day (BID) | INTRAMUSCULAR | Status: DC
  Administered 2014-11-08 – 2014-11-10 (×4): 60 mg via INTRAVENOUS
  Filled 2014-11-08 (×4): qty 6

## 2014-11-08 MED ORDER — FUROSEMIDE 10 MG/ML IJ SOLN
20.0000 mg | INTRAMUSCULAR | Status: AC
  Administered 2014-11-08: 20 mg via INTRAVENOUS
  Filled 2014-11-08: qty 2

## 2014-11-08 NOTE — Progress Notes (Signed)
[facility] PROGRESS NOTE  Jennifer Nielsen TOI:[id number] DOB: February 26, 1925 DOA: 11/07/2014 PCP: [name], MD  Assessment/Plan: 1. Acute on chronic respiratory failure 1. Secondary to likely acute CHF with pulm edema on CXR 2. Also possible sepsis with pneumonia 3. Recent EF of 50-55% on 3/16 echo 4. Responding to IV lasix diuresis 5. On abx per below 6. Cont to follow daily weights 7. Significant hypoxia on initial presentation 8. O2 improved somewhat, now on mask from BiPAP 2. Pleural effusion 1. Seen by Pulmonary. Not enough fluid to tap 2. Management per above 3. Sepsis with HCAP 1. Clinically septic 2. Pt is continued on vancomycin and cefepime 3. Presently afebrile 4. ARF 1. Cr today 1.09 from 0.49 2. Monitor closely 5. Hyperkalemia 1. Improved overnight 6. Chronic Afib 1. Currently rate controlled 2. On PRN IV lopressor 7. Dementia 1. Stable 2. Pleasant affect 8. DVT prophylaxis 1. SCD's  Code Status: DNR Family Communication: Pt in room Disposition Plan: Pending   Consultants: Pulmonary  Procedures:    Antibiotics:  Cefepime 5/27>>>  Vancomycin 5/27>>>  HPI/Subjective: States feeling better today.  Objective: Filed Vitals:   11/08/14 0806 11/08/14 0812 11/08/14 1200 11/08/14 1407  BP:      Pulse:      Temp:   97.6 F (36.4 C)   TempSrc:   Oral   Resp:      Height:      Weight:      SpO2: 98% 97%  98%    Intake/Output Summary (Last 24 hours) at 11/08/14 1416 Last data filed at 11/08/14 0200  Gross per 24 hour  Intake     50 ml  Output    700 ml  Net   -650 ml   Filed Weights   11/07/14 1348  Weight: 85.7 kg (188 lb 15 oz)    Exam:   General:  Awake, venti-mask in place, in nad  Cardiovascular: regular, s1, s2  Respiratory: normal resp effort, no wheezing  Abdomen: soft,nondistended  Musculoskeletal: perfused, no clubbing   Data Reviewed: Basic Metabolic Panel:  Recent Labs Lab 11/07/14 0845 11/08/14 0349   NA 139 142  K 5.2* 4.2  CL 93* 96*  CO2 30 36*  GLUCOSE 230* 106*  BUN 31* 32*  CREATININE 1.09* 0.72  CALCIUM 9.5 8.7*   Liver Function Tests:  Recent Labs Lab 11/07/14 0845 11/08/14 0349  AST 49* 29  ALT 27 22  ALKPHOS 101 65  BILITOT 0.7 0.8  PROT 7.5 6.2*  ALBUMIN 3.3* 2.6*   No results for input(s): LIPASE, AMYLASE in the last 168 hours. No results for input(s): AMMONIA in the last 168 hours. CBC:  Recent Labs Lab 11/07/14 0845 11/08/14 0349  WBC 20.0* 11.0*  NEUTROABS 17.0*  --   HGB 12.1 9.8*  HCT 39.2 32.7*  MCV 102.1* 100.6*  PLT 296 208   Cardiac Enzymes:  Recent Labs Lab 11/07/14 0845 11/07/14 1713 11/07/14 2302  TROPONINI 0.11* 0.08* 0.06*   BNP (last 3 results)  Recent Labs  10/09/14 1404 10/09/14 1753 11/07/14 0845  BNP 248.2* 309.6* 785.7*    ProBNP (last 3 results)  Recent Labs  02/22/14 1417 06/17/14 1050  PROBNP 2244.0* 170.0*    CBG:  Recent Labs Lab 11/07/14 0841  GLUCAP 224*    Recent Results (from the past 240 hour(s))  Blood culture (routine x 2)     Status: None (Preliminary result)   Collection Time: 11/07/14 10:01 AM  Result Value Ref Range Status  Specimen Description BLOOD RIGHT HAND  Final   Special Requests BOTTLES DRAWN AEROBIC AND ANAEROBIC 5ML  Final   Culture   Final           BLOOD CULTURE RECEIVED NO GROWTH TO DATE CULTURE WILL BE HELD FOR 5 DAYS BEFORE ISSUING A FINAL NEGATIVE REPORT Performed at [facility]    Report Status PENDING  Incomplete  Blood culture (routine x 2)     Status: None (Preliminary result)   Collection Time: 11/07/14 10:01 AM  Result Value Ref Range Status   Specimen Description BLOOD RIGHT WRIST  Final   Special Requests BOTTLES DRAWN AEROBIC AND ANAEROBIC 5CC  Final   Culture   Final           BLOOD CULTURE RECEIVED NO GROWTH TO DATE CULTURE WILL BE HELD FOR 5 DAYS BEFORE ISSUING A FINAL NEGATIVE REPORT Performed at [facility]    Report Status  PENDING  Incomplete  MRSA PCR Screening     Status: None   Collection Time: 11/07/14  1:41 PM  Result Value Ref Range Status   MRSA by PCR NEGATIVE NEGATIVE Final    Comment:        The GeneXpert MRSA Assay (FDA approved for NASAL specimens only), is one component of a comprehensive MRSA colonization surveillance program. It is not intended to diagnose MRSA infection nor to guide or monitor treatment for MRSA infections.      Studies: Dg Chest 2 View  11/07/2014   CLINICAL DATA:  Shortness breath. Under ext bouts of with labored breathing. Transfer from skilled nursing facility.  EXAM: CHEST - 2 VIEW  COMPARISON:  CT of the chest 10/12/2014 and chest x-ray 10/11/2014.  FINDINGS: Heart is enlarged. Bilateral pleural effusions have increased, left greater than right. Mild edema is noted. Bibasilar airspace opacities are present. Degenerative changes are again noted in both shoulders.  IMPRESSION: 1. Increased interstitial edema and bilateral pleural effusions compatible with congestive heart failure. 2. Bibasilar airspace disease likely reflects atelectasis.   Electronically Signed   By: San Morelle M.D.   On: 11/07/2014 10:46    Scheduled Meds: . arformoterol  15 mcg Nebulization BID  . budesonide  0.25 mg Nebulization 4 times per day  . ceFEPime (MAXIPIME) IV  2 g Intravenous Q24H  . furosemide  60 mg Intravenous BID  . ipratropium-albuterol  3 mL Nebulization Q6H  . rivaroxaban  20 mg Oral Daily  . vancomycin  1,250 mg Intravenous Q24H   Continuous Infusions:   Active Problems:   Atrial fibrillation   COPD exacerbation   Respiratory failure   Acute on chronic respiratory failure   CHIU, STEPHEN K  [facility] Pager [phone]. If 7PM-7AM, please contact night-coverage at [email or web address], password [facility] 11/08/2014, 2:16 PM  LOS: 1 day

## 2014-11-08 NOTE — Progress Notes (Signed)
Name: Jennifer Nielsen MRN: [id number] DOB: 10-30-1924    ADMISSION DATE:  11/07/2014 CONSULTATION DATE:  11/08/2014  REFERRING MD :  Erlinda Hong  CHIEF COMPLAINT:  AMS, respiratory distress  BRIEF PATIENT DESCRIPTION: 81 y.o. F with multiple recent admissions brought to [facility] ED 5/26 for AMS and respiratory distress.  Found to have HCAP and bilateral pleural effusions, placed on BiPAP.  PCCM consulted for consideration thoracentesis.  SIGNIFICANT EVENTS  5/26   Admit 5/27   Assessed for thora at bedside, not enough fluid to tap   STUDIES:  CXR 5/26 >> increased interstitial edema and b/l pleural effusions, bibasilar atx.     SUBJECTIVE:  Pt is demented, pleasantly confused.  No acute events. Net neg 450 after lasix   VITAL SIGNS: Temp:  [97.6 F (36.4 C)-98.8 F (37.1 C)] 98 F (36.7 C) (05/27 0800) Pulse Rate:  [72-129] 83 (05/27 0700) Resp:  [18-35] 27 (05/27 0700) BP: (99-144)/(25-90) 130/68 mmHg (05/27 0700) SpO2:  [93 %-100 %] 97 % (05/27 0812) FiO2 (%):  [30 %] 30 % (05/27 0306) Weight:  [188 lb 15 oz (85.7 kg)] 188 lb 15 oz (85.7 kg) (05/26 1348)  PHYSICAL EXAMINATION: General: Chronically ill appearing, frail elderly female, resting in bed, in NAD. Neuro: awake, alert, not oriented but pleasant  HEENT: [place]/AT. PERRL, sclerae anicteric. Cardiovascular: IRIR, no M/R/G.  Lungs: Respirations even/non-labored, diminished breath sounds bilaterally Abdomen: BS x 4, soft, NT/ND.  Musculoskeletal: No gross deformities, no edema.  Skin: Intact, warm, no rashes.     Recent Labs Lab 11/07/14 0845 11/08/14 0349  NA 139 142  K 5.2* 4.2  CL 93* 96*  CO2 30 36*  BUN 31* 32*  CREATININE 1.09* 0.72  GLUCOSE 230* 106*    Recent Labs Lab 11/07/14 0845 11/08/14 0349  HGB 12.1 9.8*  HCT 39.2 32.7*  WBC 20.0* 11.0*  PLT 296 208   Dg Chest 2 View  11/07/2014   CLINICAL DATA:  Shortness breath. Under ext bouts of with labored breathing. Transfer from skilled nursing facility.   EXAM: CHEST - 2 VIEW  COMPARISON:  CT of the chest 10/12/2014 and chest x-ray 10/11/2014.  FINDINGS: Heart is enlarged. Bilateral pleural effusions have increased, left greater than right. Mild edema is noted. Bibasilar airspace opacities are present. Degenerative changes are again noted in both shoulders.  IMPRESSION: 1. Increased interstitial edema and bilateral pleural effusions compatible with congestive heart failure. 2. Bibasilar airspace disease likely reflects atelectasis.   Electronically Signed   By: San Morelle M.D.   On: 11/07/2014 10:46    ASSESSMENT / PLAN:  Sepsis - likely due to HCAP Plan: Continue empiric abx. Follow cultures.  Acute on chronic hypoxic and hypercarbic respiratory failure Bilateral pleural effusions - assessed at bedside 5/27 with [place], not enough fluid for thora HCAP Reported COPD (no PFT's)  Plan: PRN BiPAP, would use caution given risk for aspiration.  Ensure adequate mental status when using.  Titrate O2 for sats > 90% Resume Xarelto  Diuresis as BP and SCr permit. Lasix 60 mg BID. Monitor BMP with diuresis  Continue DuoNebs / Albuterol. Budesonide / Arformoterol Resume symbicort at time of discharge. No role systemic steroids as no bronchospasm  Continue empiric abx for possible HCAP. Check PCT - if low then could consider d/c abx. Would not obtain PFT's in this elderly pt with dementia. Pulmonary hygiene. Trend CXR intermittently    A.fib - on xarelto, diltiazem, digoxin dCHF  Plan: Resume xarelto  Lopressor PRN. Holding digoxin  due to mild hyperkalemia Diurese as BP and SCr permit. DNR    PCCM will sign off, please call if we can be of further assistance.    Noe Gens, NP-C [facility] Pgr: [phone] or [phone]  11/08/2014, 10:33 AM  Attending note:  81 year old female with extensive PMH to include dementia, COPD and CHF who was found unresponsive and in respiratory failure. Patient is DNR  appropriately but was placed on BiPAP and PCCM was called for respiratory failure and pleural effusion.  I reviewed CXR myself, pulmonary edema and left sided pleural effusion noted.  Case discussed with primary MD and PCCM-NP.  Acute on chronic hypoxemic respiratory failure: due to pulmonary edema. - D/C BiPAP. - No need to repeat ABG. - Would recommend calling palliative care and home with hospice.  Hypoxemia: - Supplemental O2. - Titrate O2 for sat of 88-92%.  Acute pulmonary edema: due to CHF and fluid overload. - Diureses as able. - Monitor renal function and BP.  Pleural effusion: likely related to CHF rather than infection. - Diureses. - Bedside US in AM. - May restart xeralto.  - Evaluated with U/S bedside, not enough fluid to tap.  HCAP: recent admission and lives in SNF. - Pan cultures as ordered. - Cefepime and vanc. - F/U on culture and narrow abx as able.  PCCM will sign off, please call back if needed.  Patient seen and examined, agree with above note. I dictated the care and orders written for this patient under my direction.  Rush Farmer, MD [phone]

## 2014-11-09 ENCOUNTER — Inpatient Hospital Stay (HOSPITAL_COMMUNITY): Payer: Medicare Other

## 2014-11-09 DIAGNOSIS — I5021 Acute systolic (congestive) heart failure: Secondary | ICD-10-CM

## 2014-11-09 LAB — BASIC METABOLIC PANEL
Anion gap: 10 (ref 5–15)
BUN: 28 mg/dL — ABNORMAL HIGH (ref 6–20)
CALCIUM: 8.3 mg/dL — AB (ref 8.9–10.3)
CO2: 37 mmol/L — ABNORMAL HIGH (ref 22–32)
Chloride: 94 mmol/L — ABNORMAL LOW (ref 101–111)
Creatinine, Ser: 0.68 mg/dL (ref 0.44–1.00)
GFR calc Af Amer: >60 mL/min (ref >60)
GFR calc non Af Amer: >60 mL/min (ref >60)
Glucose, Bld: 113 mg/dL — ABNORMAL HIGH (ref 65–99)
Potassium: 3.9 mmol/L (ref 3.5–5.1)
Sodium: 141 mmol/L (ref 135–145)

## 2014-11-09 LAB — PROCALCITONIN: Procalcitonin: 0.97 ng/mL

## 2014-11-09 LAB — CBC
HCT: 32.2 % — ABNORMAL LOW (ref 36.0–46.0)
HEMOGLOBIN: 9.5 g/dL — AB (ref 12.0–15.0)
MCH: 29.9 pg (ref 26.0–34.0)
MCHC: 29.5 g/dL — ABNORMAL LOW (ref 30.0–36.0)
MCV: 101.3 fL — ABNORMAL HIGH (ref 78.0–100.0)
Platelets: 235 10*3/uL (ref 150–400)
RBC: 3.18 MIL/uL — ABNORMAL LOW (ref 3.87–5.11)
RDW: 18.2 % — AB (ref 11.5–15.5)
WBC: 10.4 10*3/uL (ref 4.0–10.5)

## 2014-11-09 MED ORDER — METHYLPREDNISOLONE SODIUM SUCC 125 MG IJ SOLR
60.0000 mg | Freq: Three times a day (TID) | INTRAMUSCULAR | Status: DC
  Administered 2014-11-09 – 2014-11-14 (×15): 60 mg via INTRAVENOUS
  Filled 2014-11-09 (×15): qty 2

## 2014-11-09 MED ORDER — IPRATROPIUM-ALBUTEROL 0.5-2.5 (3) MG/3ML IN SOLN
3.0000 mL | Freq: Four times a day (QID) | RESPIRATORY_TRACT | Status: DC
Start: 2014-11-10 — End: 2014-11-12
  Administered 2014-11-10 – 2014-11-12 (×9): 3 mL via RESPIRATORY_TRACT
  Filled 2014-11-09 (×9): qty 3

## 2014-11-09 NOTE — Progress Notes (Signed)
[facility] PROGRESS NOTE  [name]:[id number] DOB: 1924-10-04 DOA: 11/07/2014 PCP: [name], MD  Assessment/Plan: 1. Acute on chronic respiratory failure 1. Secondary to likely acute CHF with pulm edema on CXR and concurrent possible sepsis with pneumonia 2. Recent EF of 50-55% on 3/16 echo. Given acuity of symptoms, will repeat 2d echo 3. Seems to be responding to IV lasix diuresis 4. On empiric abx per below 5. Cont to follow daily weights 6. Significant hypoxia on initial presentation 7. Pt required BiPAP overnight, down to [place] this AM.  8. Will repeat cxr for interval change 2. Pleural effusion 1. Seen by Pulmonary. Not enough fluid to tap 2. Management per above 3. Sepsis with HCAP 1. Clinically septic 2. Pt is continued on vancomycin and cefepime 3. Remains afebrile overnight 4. ARF 1. Renal function at or near baseline 2. Cont to monitor closely 5. Hyperkalemia 1. resolved 6. Chronic Afib 1. Currently rate controlled 2. On PRN IV lopressor 7. Dementia 1. Stable 2. Pt remains with pleasant affect 8. DVT prophylaxis 1. SCD's  Code Status: DNR Family Communication: Pt in room Disposition Plan: Pending   Consultants: Pulmonary  Procedures:    Antibiotics:  Cefepime 5/27>>>  Vancomycin 5/27>>>  HPI/Subjective: Pt is without complaints. Claims to be breathing better today.  Objective: Filed Vitals:   11/09/14 1000 11/09/14 1100 11/09/14 1200 11/09/14 1300  BP: 140/75 159/71 133/64 131/64  Pulse: 92 82 91 79  Temp:   97.6 F (36.4 C)   TempSrc:   Oral   Resp: 26 45 33 31  Height:      Weight:      SpO2: 98% 94% 95% 100%    Intake/Output Summary (Last 24 hours) at 11/09/14 1331 Last data filed at 11/09/14 1300  Gross per 24 hour  Intake    520 ml  Output   2130 ml  Net  -1610 ml   Filed Weights   11/07/14 1348  Weight: 85.7 kg (188 lb 15 oz)    Exam:   General:  Awake, [place] in place, in nad  Cardiovascular: regular,  s1, s2  Respiratory: normal resp effort, no wheezing, decreased BS  Abdomen: soft,nondistended  Musculoskeletal: perfused, no clubbing   Data Reviewed: Basic Metabolic Panel:  Recent Labs Lab 11/07/14 0845 11/08/14 0349 11/09/14 0349  NA 139 142 141  Nielsen 5.2* 4.2 3.9  CL 93* 96* 94*  CO2 30 36* 37*  GLUCOSE 230* 106* 113*  BUN 31* 32* 28*  CREATININE 1.09* 0.72 0.68  CALCIUM 9.5 8.7* 8.3*   Liver Function Tests:  Recent Labs Lab 11/07/14 0845 11/08/14 0349  AST 49* 29  ALT 27 22  ALKPHOS 101 65  BILITOT 0.7 0.8  PROT 7.5 6.2*  ALBUMIN 3.3* 2.6*   No results for input(s): LIPASE, AMYLASE in the last 168 hours. No results for input(s): AMMONIA in the last 168 hours. CBC:  Recent Labs Lab 11/07/14 0845 11/08/14 0349 11/09/14 0349  WBC 20.0* 11.0* 10.4  NEUTROABS 17.0*  --   --   HGB 12.1 9.8* 9.5*  HCT 39.2 32.7* 32.2*  MCV 102.1* 100.6* 101.3*  PLT 296 208 235   Cardiac Enzymes:  Recent Labs Lab 11/07/14 0845 11/07/14 1713 11/07/14 2302  TROPONINI 0.11* 0.08* 0.06*   BNP (last 3 results)  Recent Labs  10/09/14 1404 10/09/14 1753 11/07/14 0845  BNP 248.2* 309.6* 785.7*    ProBNP (last 3 results)  Recent Labs  02/22/14 1417 06/17/14 1050  PROBNP 2244.0* 170.0*  CBG:  Recent Labs Lab 11/07/14 0841  GLUCAP 224*    Recent Results (from the past 240 hour(s))  Blood culture (routine x 2)     Status: None (Preliminary result)   Collection Time: 11/07/14 10:01 AM  Result Value Ref Range Status   Specimen Description BLOOD RIGHT HAND  Final   Special Requests BOTTLES DRAWN AEROBIC AND ANAEROBIC 5ML  Final   Culture   Final           BLOOD CULTURE RECEIVED NO GROWTH TO DATE CULTURE WILL BE HELD FOR 5 DAYS BEFORE ISSUING A FINAL NEGATIVE REPORT Performed at [facility]    Report Status PENDING  Incomplete  Blood culture (routine x 2)     Status: None (Preliminary result)   Collection Time: 11/07/14 10:01 AM  Result  Value Ref Range Status   Specimen Description BLOOD RIGHT WRIST  Final   Special Requests BOTTLES DRAWN AEROBIC AND ANAEROBIC 5CC  Final   Culture   Final           BLOOD CULTURE RECEIVED NO GROWTH TO DATE CULTURE WILL BE HELD FOR 5 DAYS BEFORE ISSUING A FINAL NEGATIVE REPORT Performed at [facility]    Report Status PENDING  Incomplete  MRSA PCR Screening     Status: None   Collection Time: 11/07/14  1:41 PM  Result Value Ref Range Status   MRSA by PCR NEGATIVE NEGATIVE Final    Comment:        The GeneXpert MRSA Assay (FDA approved for NASAL specimens only), is one component of a comprehensive MRSA colonization surveillance program. It is not intended to diagnose MRSA infection nor to guide or monitor treatment for MRSA infections.      Studies: No results found.  Scheduled Meds: . arformoterol  15 mcg Nebulization BID  . budesonide  0.25 mg Nebulization 4 times per day  . ceFEPime (MAXIPIME) IV  2 g Intravenous Q24H  . furosemide  60 mg Intravenous BID  . ipratropium-albuterol  3 mL Nebulization Q6H  . rivaroxaban  20 mg Oral Daily  . vancomycin  1,250 mg Intravenous Q24H   Continuous Infusions:   Active Problems:   Atrial fibrillation   COPD exacerbation   Respiratory failure   Acute on chronic respiratory failure   Congestive heart disease   Jennifer Nielsen  [facility] Pager [phone]. If 7PM-7AM, please contact night-coverage at [email or web address], password [facility] 11/09/2014, 1:31 PM  LOS: 2 days

## 2014-11-09 NOTE — Progress Notes (Signed)
PT off BiPAP when RT entered room at 0856.

## 2014-11-09 NOTE — Progress Notes (Signed)
PT remains of BiPAP- does not appear to be respiratory distress at this time.

## 2014-11-09 NOTE — Progress Notes (Signed)
PT remains off BiPAP. PT does not appear to be in respiratory distress at this time.

## 2014-11-10 ENCOUNTER — Inpatient Hospital Stay (HOSPITAL_COMMUNITY): Payer: Medicare Other

## 2014-11-10 DIAGNOSIS — I509 Heart failure, unspecified: Secondary | ICD-10-CM

## 2014-11-10 LAB — BASIC METABOLIC PANEL
Anion gap: 10 (ref 5–15)
BUN: 27 mg/dL — ABNORMAL HIGH (ref 6–20)
CALCIUM: 8.7 mg/dL — AB (ref 8.9–10.3)
CO2: 38 mmol/L — ABNORMAL HIGH (ref 22–32)
Chloride: 91 mmol/L — ABNORMAL LOW (ref 101–111)
Creatinine, Ser: 0.61 mg/dL (ref 0.44–1.00)
GFR calc Af Amer: >60 mL/min (ref >60)
GFR calc non Af Amer: >60 mL/min (ref >60)
GLUCOSE: 182 mg/dL — AB (ref 65–99)
POTASSIUM: 4.5 mmol/L (ref 3.5–5.1)
SODIUM: 139 mmol/L (ref 135–145)

## 2014-11-10 LAB — BLOOD GAS, ARTERIAL
Acid-Base Excess: 11.1 mmol/L — ABNORMAL HIGH (ref 0.0–2.0)
Bicarbonate: 38.8 mEq/L — ABNORMAL HIGH (ref 20.0–24.0)
Drawn by: 257701
O2 Content: 2 L/min
O2 Saturation: 92.6 %
PO2 ART: 70.9 mmHg — AB (ref 80.0–100.0)
Patient temperature: 98.6
TCO2: 35.4 mmol/L (ref 0–100)
pCO2 arterial: 69.6 mmHg (ref 35.0–45.0)
pH, Arterial: 7.364 (ref 7.350–7.450)

## 2014-11-10 LAB — CBC
HEMATOCRIT: 36 % (ref 36.0–46.0)
Hemoglobin: 10.7 g/dL — ABNORMAL LOW (ref 12.0–15.0)
MCH: 30.3 pg (ref 26.0–34.0)
MCHC: 29.7 g/dL — ABNORMAL LOW (ref 30.0–36.0)
MCV: 102 fL — ABNORMAL HIGH (ref 78.0–100.0)
Platelets: 291 10*3/uL (ref 150–400)
RBC: 3.53 MIL/uL — AB (ref 3.87–5.11)
RDW: 18.1 % — ABNORMAL HIGH (ref 11.5–15.5)
WBC: 8.5 10*3/uL (ref 4.0–10.5)

## 2014-11-10 MED ORDER — CETYLPYRIDINIUM CHLORIDE 0.05 % MT LIQD
7.0000 mL | Freq: Two times a day (BID) | OROMUCOSAL | Status: DC
  Administered 2014-11-11 – 2014-11-20 (×14): 7 mL via OROMUCOSAL

## 2014-11-10 MED ORDER — FUROSEMIDE 10 MG/ML IJ SOLN
80.0000 mg | Freq: Two times a day (BID) | INTRAMUSCULAR | Status: DC
  Administered 2014-11-10 – 2014-11-11 (×3): 80 mg via INTRAVENOUS
  Filled 2014-11-10 (×3): qty 8

## 2014-11-10 MED ORDER — DEXTROSE 5 % IV SOLN
1.0000 g | Freq: Two times a day (BID) | INTRAVENOUS | Status: DC
  Administered 2014-11-10 – 2014-11-13 (×6): 1 g via INTRAVENOUS
  Filled 2014-11-10 (×6): qty 1

## 2014-11-10 MED ORDER — FUROSEMIDE 10 MG/ML IJ SOLN
20.0000 mg | Freq: Once | INTRAMUSCULAR | Status: AC
  Administered 2014-11-10: 20 mg via INTRAVENOUS
  Filled 2014-11-10: qty 2

## 2014-11-10 NOTE — Progress Notes (Signed)
ANTIBIOTIC CONSULT NOTE - follow up  Pharmacy Consult for Cefepime, Vancomycin Indication: sepsis, HCAP  Allergies  Allergen Reactions  . Chocolate Itching  . Ciprofloxacin Itching  . Coffee Bean Extract [Coffea Arabica] Itching  . Coffee Flavor Itching  . Eggs Or Egg-Derived Products   . Gentamycin [Gentamicin] Itching  . Imodium [Loperamide] Other (See Comments)    unknown  . Penicillins Itching  . Sulfa Antibiotics Itching  . Tea Itching    Patient Measurements: Height: 5\' 7"  (170.2 cm) Weight: 189 lb 6 oz (85.9 kg) IBW/kg (Calculated) : 61.6   Vital Signs: Temp: 97.6 F (36.4 C) (05/29 1200) Temp Source: Oral (05/29 1200) BP: 139/65 mmHg (05/29 1000) Pulse Rate: 104 (05/29 1000) Intake/Output from previous day: 05/28 0701 - 05/29 0700 In: 540 [IV Piggyback:300] Out: 1625 [XTGGY:6948] Intake/Output from this shift: Total I/O In: 290 [Other:40; IV Piggyback:250] Out: 400 [Urine:400]  Labs:  Recent Labs  11/08/14 0349 11/09/14 0349 11/10/14 0405  WBC 11.0* 10.4 8.5  HGB 9.8* 9.5* 10.7*  PLT 208 235 291  CREATININE 0.72 0.68 0.61   Estimated Creatinine Clearance: 54.7 mL/min (by C-G formula based on Cr of 0.61). No results for input(s): VANCOTROUGH, VANCOPEAK, VANCORANDOM, GENTTROUGH, GENTPEAK, GENTRANDOM, TOBRATROUGH, TOBRAPEAK, TOBRARND, AMIKACINPEAK, AMIKACINTROU, AMIKACIN in the last 72 hours.   Microbiology: Recent Results (from the past 720 hour(s))  Blood culture (routine x 2)     Status: None (Preliminary result)   Collection Time: 11/07/14 10:01 AM  Result Value Ref Range Status   Specimen Description BLOOD RIGHT HAND  Final   Special Requests BOTTLES DRAWN AEROBIC AND ANAEROBIC 5ML  Final   Culture   Final           BLOOD CULTURE RECEIVED NO GROWTH TO DATE CULTURE WILL BE HELD FOR 5 DAYS BEFORE ISSUING A FINAL NEGATIVE REPORT Performed at [facility]    Report Status PENDING  Incomplete  Blood culture (routine x 2)     Status:  None (Preliminary result)   Collection Time: 11/07/14 10:01 AM  Result Value Ref Range Status   Specimen Description BLOOD RIGHT WRIST  Final   Special Requests BOTTLES DRAWN AEROBIC AND ANAEROBIC 5CC  Final   Culture   Final           BLOOD CULTURE RECEIVED NO GROWTH TO DATE CULTURE WILL BE HELD FOR 5 DAYS BEFORE ISSUING A FINAL NEGATIVE REPORT Performed at [facility]    Report Status PENDING  Incomplete  MRSA PCR Screening     Status: None   Collection Time: 11/07/14  1:41 PM  Result Value Ref Range Status   MRSA by PCR NEGATIVE NEGATIVE Final    Comment:        The GeneXpert MRSA Assay (FDA approved for NASAL specimens only), is one component of a comprehensive MRSA colonization surveillance program. It is not intended to diagnose MRSA infection nor to guide or monitor treatment for MRSA infections.     Medical History: Past Medical History  Diagnosis Date  . Arrhythmia   . Arthritis   . Atrial fibrillation     Remains stable (As of 05/09/13)  . H/O blood clots   . Hyperlipemia   . GERD (gastroesophageal reflux disease)     Stable (As of 05/09/13)  . Osteoarthritis     Denies pain (As of 05/09/13)  . CHF (congestive heart failure)     chronic diastolic  . COPD (chronic obstructive pulmonary disease)     w/exacerbation  .  Dementia     Remains stable & continues to function adequately in the current living environment with supervision. Has had little changes in behavior (AS OF 05/09/13)    Assessment: 24 y/oF with PMH of a-fib, diastolic CHF, COPD, chronic respiratory failure on home O2, dementia who was sent from SNF due to unresponsiveness and labored breathing. In ED, HR slightly elevated, temp of 100.47F, leukocytosis, and elevated lactic acid. Pharmacy consulted to assist with dosing of Cefepime and Vancomycin for sepsis, PNA.  5/26 >> Vancomycin >> 5/26 >> Cefepime >>    5/26 blood x 2: ngtd 5/26 MRSA PCR: negative  Afebrile Improving WBC SCr  stable, CrCl 55  Goal of Therapy:  Vancomycin trough level 15-20 mcg/mL Appropriate antibiotic dosing for renal function and indication Eradication of infection  Plan:  1) Continue Vancomycin 1250mg  IV q24 2) Change cefepime to 1g IV q12 for CrCl 30-60 ml/min 3) Will check vanc trough tomorrow AM prior to 8am dose, which will be prior to 5th dose   Adrian Saran, PharmD, BCPS Pager [phone] 11/10/2014 1:02 PM

## 2014-11-10 NOTE — Progress Notes (Signed)
  Echocardiogram 2D Echocardiogram has been performed.  Donata Clay 11/10/2014, 9:57 AM

## 2014-11-10 NOTE — Progress Notes (Signed)
[facility] PROGRESS NOTE  Jennifer Nielsen JSE:[id number] DOB: 1924-08-14 DOA: 11/07/2014 PCP: [name], MD  Assessment/Plan: 1. Acute on chronic respiratory failure 1. Secondary to likely acute CHF with pulm edema on CXR and concurrent possible sepsis with pneumonia 2. Recent EF of 50-55% on 3/16 echo. Given acuity of symptoms, have repeated 2d echo with findings of EF of 60-65% 3. Pt subjectively reports feeling better with IV lasix although objectively, pt remains with tachypnea  4. Pt is continued on empiric abx per below 5. Cont to follow daily weights: 85.9kg<-85.7kg  6. Repeat cxr is without significant interval change 7. Will increase lasix from 60mg  bid to 80mg  bid 2. Pleural effusion 1. Recently seen by Pulmonary. Not enough fluid to tap 2. Management per above 3. Sepsis with HCAP 1. Clinically septic 2. Pt is continued on vancomycin and cefepime 3. Remains afebrile 4. ARF 1. Renal function remains at or near baseline 2. Cont to monitor closely 5. Hyperkalemia 1. resolved 6. Chronic Afib 1. Currently rate controlled 2. On PRN IV lopressor 7. Dementia 1. Stable 2. Pt remains with pleasant affect 8. DVT prophylaxis 1. SCD's  Code Status: DNR Family Communication: Pt in room Disposition Plan: Pending   Consultants: Pulmonary  Procedures:    Antibiotics:  Cefepime 5/27>>>  Vancomycin 5/27>>>  HPI/Subjective: Patient claims be breath better today, although still reports sob.  Objective: Filed Vitals:   11/10/14 0840 11/10/14 0900 11/10/14 1000 11/10/14 1200  BP:  164/77 139/65   Pulse:  71 104   Temp:    97.6 F (36.4 C)  TempSrc:    Oral  Resp:  28 29   Height:      Weight:      SpO2: 95% 95% 95%     Intake/Output Summary (Last 24 hours) at 11/10/14 1533 Last data filed at 11/10/14 1100  Gross per 24 hour  Intake    450 ml  Output   1375 ml  Net   -925 ml   Filed Weights   11/07/14 1348 11/10/14 0400  Weight: 85.7 kg (188 lb 15  oz) 85.9 kg (189 lb 6 oz)    Exam:   General:  Asleep, easily arousable, [place] in place, in nad  Cardiovascular: regular, s1, s2  Respiratory: tachypnea, no audible wheezing, decreased BS  Abdomen: soft,nondistended  Musculoskeletal: perfused, no clubbing   Data Reviewed: Basic Metabolic Panel:  Recent Labs Lab 11/07/14 0845 11/08/14 0349 11/09/14 0349 11/10/14 0405  NA 139 142 141 139  K 5.2* 4.2 3.9 4.5  CL 93* 96* 94* 91*  CO2 30 36* 37* 38*  GLUCOSE 230* 106* 113* 182*  BUN 31* 32* 28* 27*  CREATININE 1.09* 0.72 0.68 0.61  CALCIUM 9.5 8.7* 8.3* 8.7*   Liver Function Tests:  Recent Labs Lab 11/07/14 0845 11/08/14 0349  AST 49* 29  ALT 27 22  ALKPHOS 101 65  BILITOT 0.7 0.8  PROT 7.5 6.2*  ALBUMIN 3.3* 2.6*   No results for input(s): LIPASE, AMYLASE in the last 168 hours. No results for input(s): AMMONIA in the last 168 hours. CBC:  Recent Labs Lab 11/07/14 0845 11/08/14 0349 11/09/14 0349 11/10/14 0405  WBC 20.0* 11.0* 10.4 8.5  NEUTROABS 17.0*  --   --   --   HGB 12.1 9.8* 9.5* 10.7*  HCT 39.2 32.7* 32.2* 36.0  MCV 102.1* 100.6* 101.3* 102.0*  PLT 296 208 235 291   Cardiac Enzymes:  Recent Labs Lab 11/07/14 0845 11/07/14 1713 11/07/14 2302  TROPONINI  0.11* 0.08* 0.06*   BNP (last 3 results)  Recent Labs  10/09/14 1404 10/09/14 1753 11/07/14 0845  BNP 248.2* 309.6* 785.7*    ProBNP (last 3 results)  Recent Labs  02/22/14 1417 06/17/14 1050  PROBNP 2244.0* 170.0*    CBG:  Recent Labs Lab 11/07/14 0841  GLUCAP 224*    Recent Results (from the past 240 hour(s))  Blood culture (routine x 2)     Status: None (Preliminary result)   Collection Time: 11/07/14 10:01 AM  Result Value Ref Range Status   Specimen Description BLOOD RIGHT HAND  Final   Special Requests BOTTLES DRAWN AEROBIC AND ANAEROBIC 5ML  Final   Culture   Final           BLOOD CULTURE RECEIVED NO GROWTH TO DATE CULTURE WILL BE HELD FOR 5 DAYS BEFORE  ISSUING A FINAL NEGATIVE REPORT Performed at [facility]    Report Status PENDING  Incomplete  Blood culture (routine x 2)     Status: None (Preliminary result)   Collection Time: 11/07/14 10:01 AM  Result Value Ref Range Status   Specimen Description BLOOD RIGHT WRIST  Final   Special Requests BOTTLES DRAWN AEROBIC AND ANAEROBIC 5CC  Final   Culture   Final           BLOOD CULTURE RECEIVED NO GROWTH TO DATE CULTURE WILL BE HELD FOR 5 DAYS BEFORE ISSUING A FINAL NEGATIVE REPORT Performed at [facility]    Report Status PENDING  Incomplete  MRSA PCR Screening     Status: None   Collection Time: 11/07/14  1:41 PM  Result Value Ref Range Status   MRSA by PCR NEGATIVE NEGATIVE Final    Comment:        The GeneXpert MRSA Assay (FDA approved for NASAL specimens only), is one component of a comprehensive MRSA colonization surveillance program. It is not intended to diagnose MRSA infection nor to guide or monitor treatment for MRSA infections.      Studies: Dg Chest Port 1 View  11/10/2014   CLINICAL DATA:  Pt diagnosed with CHF, MD concerned about how her lungs are looking today as compared to prior x-rayPt was on breathing tx during exam, seemed SOBHx COPD, a-fib  EXAM: PORTABLE CHEST - 1 VIEW  COMPARISON:  11/09/2014  FINDINGS: There are left greater than right pleural effusions. There is vascular congestion as well as hazy airspace opacity that predominate centrally. Cardiac silhouette is mildly enlarged and incompletely visualized.  Rib fractures noted on the right are again noted, chronicity and clear. Bony thorax is extensively demineralized. Advanced arthropathic changes of the right shoulder are noted.  IMPRESSION: 1. No significant change from the previous day's study. 2. Stable pleural effusions and pulmonary edema, like the chest or failure   Electronically Signed   By: Lajean Manes M.D.   On: 11/10/2014 09:34   Dg Chest Port 1 View  11/09/2014   CLINICAL  DATA:  Shortness of breath.  EXAM: PORTABLE CHEST - 1 VIEW  COMPARISON:  11/07/2014  FINDINGS: The cardiac enlargement is noted. There are moderate to large bilateral pleural effusions noted left greater night. This is similar in volume to previous exam. Increase in interstitial edema. Chronic right posterior rib fracture deformities are again noted.  IMPRESSION: 1. No change in bilateral pleural effusions left greater night. 2. Increase in pulmonary edema.   Electronically Signed   By: Kerby Moors M.D.   On: 11/09/2014 13:58  Scheduled Meds: . arformoterol  15 mcg Nebulization BID  . budesonide  0.25 mg Nebulization 4 times per day  . ceFEPime (MAXIPIME) IV  1 g Intravenous Q12H  . furosemide  80 mg Intravenous BID  . ipratropium-albuterol  3 mL Nebulization QID  . methylPREDNISolone (SOLU-MEDROL) injection  60 mg Intravenous Q8H  . rivaroxaban  20 mg Oral Daily  . vancomycin  1,250 mg Intravenous Q24H   Continuous Infusions:   Active Problems:   Atrial fibrillation   COPD exacerbation   Respiratory failure   Acute on chronic respiratory failure   Congestive heart disease   CHIU, STEPHEN K  [facility] Pager [phone]. If 7PM-7AM, please contact night-coverage at [email or web address], password [facility] 11/10/2014, 3:33 PM  LOS: 3 days

## 2014-11-11 ENCOUNTER — Inpatient Hospital Stay (HOSPITAL_COMMUNITY): Payer: Medicare Other

## 2014-11-11 DIAGNOSIS — I4891 Unspecified atrial fibrillation: Secondary | ICD-10-CM

## 2014-11-11 LAB — BASIC METABOLIC PANEL
Anion gap: 11 (ref 5–15)
BUN: 32 mg/dL — AB (ref 6–20)
CALCIUM: 8.8 mg/dL — AB (ref 8.9–10.3)
CHLORIDE: 93 mmol/L — AB (ref 101–111)
CO2: 39 mmol/L — AB (ref 22–32)
CREATININE: 0.51 mg/dL (ref 0.44–1.00)
GFR calc Af Amer: >60 mL/min (ref >60)
GFR calc non Af Amer: >60 mL/min (ref >60)
GLUCOSE: 153 mg/dL — AB (ref 65–99)
POTASSIUM: 4 mmol/L (ref 3.5–5.1)
Sodium: 143 mmol/L (ref 135–145)

## 2014-11-11 LAB — BLOOD GAS, ARTERIAL
Acid-Base Excess: 15.6 mmol/L — ABNORMAL HIGH (ref 0.0–2.0)
Bicarbonate: 42.8 mEq/L — ABNORMAL HIGH (ref 20.0–24.0)
DRAWN BY: 232811
O2 Content: 2 L/min
O2 SAT: 93 %
PATIENT TEMPERATURE: 98.1
PO2 ART: 67.2 mmHg — AB (ref 80.0–100.0)
TCO2: 39 mmol/L (ref 0–100)
pCO2 arterial: 66.7 mmHg (ref 35.0–45.0)
pH, Arterial: 7.421 (ref 7.350–7.450)

## 2014-11-11 LAB — BODY FLUID CELL COUNT WITH DIFFERENTIAL
Lymphs, Fluid: 8 %
Monocyte-Macrophage-Serous Fluid: 58 % (ref 50–90)
NEUTROPHIL FLUID: 34 % — AB (ref 0–25)
WBC FLUID: 2121 uL — AB (ref 0–1000)

## 2014-11-11 LAB — PROTEIN, BODY FLUID: TOTAL PROTEIN, FLUID: 3.4 g/dL

## 2014-11-11 LAB — CBC
HEMATOCRIT: 35.4 % — AB (ref 36.0–46.0)
Hemoglobin: 10.7 g/dL — ABNORMAL LOW (ref 12.0–15.0)
MCH: 30.1 pg (ref 26.0–34.0)
MCHC: 30.2 g/dL (ref 30.0–36.0)
MCV: 99.4 fL (ref 78.0–100.0)
PLATELETS: 344 10*3/uL (ref 150–400)
RBC: 3.56 MIL/uL — AB (ref 3.87–5.11)
RDW: 17.4 % — ABNORMAL HIGH (ref 11.5–15.5)
WBC: 11.2 10*3/uL — ABNORMAL HIGH (ref 4.0–10.5)

## 2014-11-11 LAB — VANCOMYCIN, TROUGH: Vancomycin Tr: 13 ug/mL (ref 10.0–20.0)

## 2014-11-11 LAB — PROCALCITONIN: PROCALCITONIN: 0.38 ng/mL

## 2014-11-11 LAB — LACTATE DEHYDROGENASE, PLEURAL OR PERITONEAL FLUID: LD FL: 96 U/L — AB (ref 3–23)

## 2014-11-11 MED ORDER — VANCOMYCIN HCL 10 G IV SOLR
1500.0000 mg | INTRAVENOUS | Status: DC
  Administered 2014-11-12: 1500 mg via INTRAVENOUS
  Filled 2014-11-11 (×2): qty 1500

## 2014-11-11 MED ORDER — FUROSEMIDE 10 MG/ML IJ SOLN
8.0000 mg/h | INTRAVENOUS | Status: DC
  Administered 2014-11-11 – 2014-11-18 (×5): 8 mg/h via INTRAVENOUS
  Filled 2014-11-11 (×10): qty 25

## 2014-11-11 NOTE — Procedures (Signed)
  Portable US guided thoracentesis 150 cc dark yellow fluid Sent for labs per MD  cxr pending

## 2014-11-11 NOTE — Progress Notes (Signed)
ANTIBIOTIC CONSULT NOTE - follow up  Pharmacy Consult for Cefepime, Vancomycin Indication: sepsis, HCAP  Allergies  Allergen Reactions  . Chocolate Itching  . Ciprofloxacin Itching  . Coffee Bean Extract [Coffea Arabica] Itching  . Coffee Flavor Itching  . Eggs Or Egg-Derived Products   . Gentamycin [Gentamicin] Itching  . Imodium [Loperamide] Other (See Comments)    unknown  . Penicillins Itching  . Sulfa Antibiotics Itching  . Tea Itching    Patient Measurements: Height: 5\' 7"  (170.2 cm) Weight: 190 lb 7.6 oz (86.4 kg) IBW/kg (Calculated) : 61.6   Vital Signs: Temp: 97.6 F (36.4 C) (05/30 0800) Temp Source: Axillary (05/30 0800) BP: 166/71 mmHg (05/30 0802) Pulse Rate: 77 (05/30 0802) Intake/Output from previous day: 05/29 0701 - 05/30 0700 In: 575 [IV Piggyback:350] Out: 1101 [Urine:1100; Stool:1] Intake/Output from this shift:    Labs:  Recent Labs  11/09/14 0349 11/10/14 0405 11/11/14 0356  WBC 10.4 8.5 11.2*  HGB 9.5* 10.7* 10.7*  PLT 235 291 344  CREATININE 0.68 0.61 0.51   Estimated Creatinine Clearance: 54.9 mL/min (by C-G formula based on Cr of 0.51).  Recent Labs  11/11/14 0655  [facility] 13     Microbiology: Recent Results (from the past 720 hour(s))  Blood culture (routine x 2)     Status: None (Preliminary result)   Collection Time: 11/07/14 10:01 AM  Result Value Ref Range Status   Specimen Description BLOOD RIGHT HAND  Final   Special Requests BOTTLES DRAWN AEROBIC AND ANAEROBIC 5ML  Final   Culture   Final           BLOOD CULTURE RECEIVED NO GROWTH TO DATE CULTURE WILL BE HELD FOR 5 DAYS BEFORE ISSUING A FINAL NEGATIVE REPORT Performed at [facility]    Report Status PENDING  Incomplete  Blood culture (routine x 2)     Status: None (Preliminary result)   Collection Time: 11/07/14 10:01 AM  Result Value Ref Range Status   Specimen Description BLOOD RIGHT WRIST  Final   Special Requests BOTTLES DRAWN AEROBIC AND  ANAEROBIC 5CC  Final   Culture   Final           BLOOD CULTURE RECEIVED NO GROWTH TO DATE CULTURE WILL BE HELD FOR 5 DAYS BEFORE ISSUING A FINAL NEGATIVE REPORT Performed at [facility]    Report Status PENDING  Incomplete  MRSA PCR Screening     Status: None   Collection Time: 11/07/14  1:41 PM  Result Value Ref Range Status   MRSA by PCR NEGATIVE NEGATIVE Final    Comment:        The GeneXpert MRSA Assay (FDA approved for NASAL specimens only), is one component of a comprehensive MRSA colonization surveillance program. It is not intended to diagnose MRSA infection nor to guide or monitor treatment for MRSA infections.     Medical History: Past Medical History  Diagnosis Date  . Arrhythmia   . Arthritis   . Atrial fibrillation     Remains stable (As of 05/09/13)  . H/O blood clots   . Hyperlipemia   . GERD (gastroesophageal reflux disease)     Stable (As of 05/09/13)  . Osteoarthritis     Denies pain (As of 05/09/13)  . CHF (congestive heart failure)     chronic diastolic  . COPD (chronic obstructive pulmonary disease)     w/exacerbation  . Dementia     Remains stable & continues to function adequately in the current  living environment with supervision. Has had little changes in behavior (AS OF 05/09/13)    Assessment: 30 y/oF with PMH of a-fib, diastolic CHF, COPD, chronic respiratory failure on home O2, dementia who was sent from SNF due to unresponsiveness and labored breathing. In ED, HR slightly elevated, temp of 100.56F, leukocytosis, and elevated lactic acid. Pharmacy consulted to assist with dosing of Cefepime and Vancomycin for sepsis, PNA.  5/26 >> Vancomycin >> 5/26 >> Cefepime >>    5/26 blood x 2: ngtd 5/26 MRSA PCR: negative  Drug Levels/Dosage Changes: 5/30 VT at 0700 = 13 on 1250mg  q24   Afebrile WBC up 11.2, PCT trending down SCr stable, CrCl 55  Goal of Therapy:  Vancomycin trough level 15-20 mcg/mL Appropriate antibiotic dosing  for renal function and indication Eradication of infection  Plan:  1) Increase Vancomycin to 1500 mg IV q24 2) Continue cefepime to 1g IV q12   Dia Sitter, PharmD, BCPS 11/11/2014 8:44 AM

## 2014-11-11 NOTE — Progress Notes (Addendum)
[facility] PROGRESS NOTE  Jennifer Nielsen GGY:[id number] DOB: 07/22/1924 DOA: 11/07/2014 PCP: [name], MD  Assessment/Plan: 1. Acute on chronic respiratory failure 1. Secondary to likely acute CHF with pulm edema on CXR and concurrent possible sepsis with pneumonia 2. Recent EF of 50-55% on 3/16 echo. Given acuity of symptoms, have repeated 2d echo with findings of EF of 60-65% 3. Pt onger BiPAP dependent and remains on 2LNC 4. Pt is csubjectively feels better with IV lasix although objectively, pt remains with tachypnea 5. Pt no lontinued on empiric abx per below 6. Cont to follow daily weights: 86.4kg<-85.9kg 7. Repeat cxr is without significant interval change 8. On Lasix at 80mg  bid 9. Case earlier was discussed with Radiology. Pt underwent US guided thoracentesis 5/30 yield yielding 150cc fluid  Addendum: Pt with continued tachypnea despite 80mg  lasix. Will cont overnight on lasix gtt and repeat CXR in AM. Renal function remains w/in normal limits. Family at bedside and agrees with plan.  2. Pleural effusion 1. Per above pt is s/p US guided thoracentesis yielding 150cc of fluid 2. Await fluid analysis 3. Cont diuresis as tolerated 3. Sepsis with HCAP 1. Clinically septic on presentation, improved 2. Pt remains on vancomycin and cefepime 3. Remains afebrile 4. ARF 1. Renal function remains at or near baseline 2. Cont to monitor closely 5. Hyperkalemia 1. resolved 6. Chronic Afib 1. Currently rate controlled 2. On PRN IV lopressor 7. Dementia 1. Stable 2. Pt remains with pleasant affect 8. DVT prophylaxis 1. SCD's  Code Status: DNR Family Communication: Pt in room Disposition Plan: Pending   Consultants: Pulmonary  Procedures:    Antibiotics:  Cefepime 5/27>>>  Vancomycin 5/27>>>  HPI/Subjective: Patient is without complaints today. Reports feeling better today.  Objective: Filed Vitals:   11/11/14 1115 11/11/14 1120 11/11/14 1249 11/11/14 1345   BP: 149/59 155/105  145/60  Pulse: 80 91  82  Temp:      TempSrc:      Resp: 38 38  29  Height:      Weight:      SpO2: 94% 94% 95% 96%    Intake/Output Summary (Last 24 hours) at 11/11/14 1523 Last data filed at 11/11/14 1344  Gross per 24 hour  Intake    575 ml  Output   1177 ml  Net   -602 ml   Filed Weights   11/07/14 1348 11/10/14 0400 11/11/14 0500  Weight: 85.7 kg (188 lb 15 oz) 85.9 kg (189 lb 6 oz) 86.4 kg (190 lb 7.6 oz)    Exam:   General:  Asleep, easily arousable, [place] in place, in nad  Cardiovascular: regular, s1, s2  Respiratory: tachypnea, shallow, rapid breaths, no wheezing  Abdomen: soft,nondistended  Musculoskeletal: perfused, no clubbing   Data Reviewed: Basic Metabolic Panel:  Recent Labs Lab 11/07/14 0845 11/08/14 0349 11/09/14 0349 11/10/14 0405 11/11/14 0356  NA 139 142 141 139 143  K 5.2* 4.2 3.9 4.5 4.0  CL 93* 96* 94* 91* 93*  CO2 30 36* 37* 38* 39*  GLUCOSE 230* 106* 113* 182* 153*  BUN 31* 32* 28* 27* 32*  CREATININE 1.09* 0.72 0.68 0.61 0.51  CALCIUM 9.5 8.7* 8.3* 8.7* 8.8*   Liver Function Tests:  Recent Labs Lab 11/07/14 0845 11/08/14 0349  AST 49* 29  ALT 27 22  ALKPHOS 101 65  BILITOT 0.7 0.8  PROT 7.5 6.2*  ALBUMIN 3.3* 2.6*   No results for input(s): LIPASE, AMYLASE in the last 168 hours. No results for  input(s): AMMONIA in the last 168 hours. CBC:  Recent Labs Lab 11/07/14 0845 11/08/14 0349 11/09/14 0349 11/10/14 0405 11/11/14 0356  WBC 20.0* 11.0* 10.4 8.5 11.2*  NEUTROABS 17.0*  --   --   --   --   HGB 12.1 9.8* 9.5* 10.7* 10.7*  HCT 39.2 32.7* 32.2* 36.0 35.4*  MCV 102.1* 100.6* 101.3* 102.0* 99.4  PLT 296 208 235 291 344   Cardiac Enzymes:  Recent Labs Lab 11/07/14 0845 11/07/14 1713 11/07/14 2302  TROPONINI 0.11* 0.08* 0.06*   BNP (last 3 results)  Recent Labs  10/09/14 1404 10/09/14 1753 11/07/14 0845  BNP 248.2* 309.6* 785.7*    ProBNP (last 3 results)  Recent Labs   02/22/14 1417 06/17/14 1050  PROBNP 2244.0* 170.0*    CBG:  Recent Labs Lab 11/07/14 0841  GLUCAP 224*    Recent Results (from the past 240 hour(s))  Blood culture (routine x 2)     Status: None (Preliminary result)   Collection Time: 11/07/14 10:01 AM  Result Value Ref Range Status   Specimen Description BLOOD RIGHT HAND  Final   Special Requests BOTTLES DRAWN AEROBIC AND ANAEROBIC 5ML  Final   Culture   Final           BLOOD CULTURE RECEIVED NO GROWTH TO DATE CULTURE WILL BE HELD FOR 5 DAYS BEFORE ISSUING A FINAL NEGATIVE REPORT Performed at [facility]    Report Status PENDING  Incomplete  Blood culture (routine x 2)     Status: None (Preliminary result)   Collection Time: 11/07/14 10:01 AM  Result Value Ref Range Status   Specimen Description BLOOD RIGHT WRIST  Final   Special Requests BOTTLES DRAWN AEROBIC AND ANAEROBIC 5CC  Final   Culture   Final           BLOOD CULTURE RECEIVED NO GROWTH TO DATE CULTURE WILL BE HELD FOR 5 DAYS BEFORE ISSUING A FINAL NEGATIVE REPORT Performed at [facility]    Report Status PENDING  Incomplete  MRSA PCR Screening     Status: None   Collection Time: 11/07/14  1:41 PM  Result Value Ref Range Status   MRSA by PCR NEGATIVE NEGATIVE Final    Comment:        The GeneXpert MRSA Assay (FDA approved for NASAL specimens only), is one component of a comprehensive MRSA colonization surveillance program. It is not intended to diagnose MRSA infection nor to guide or monitor treatment for MRSA infections.      Studies: Dg Chest Port 1 View  11/11/2014   CLINICAL DATA:  Pleural effusion.  EXAM: PORTABLE CHEST - 1 VIEW  COMPARISON:  Single view of the chest 11/10/2014.  FINDINGS: Left worse than right pleural effusions and basilar airspace disease are again seen. No pneumothorax identified. Heart size appears enlarged. Mild appearing interstitial edema is again seen.  IMPRESSION: Left worse than right pleural effusions  and basilar airspace disease.  Cardiomegaly and mild appearing interstitial edema.   Electronically Signed   By: Inge Rise M.D.   On: 11/11/2014 12:40   Dg Chest Port 1 View  11/10/2014   CLINICAL DATA:  Pt diagnosed with CHF, MD concerned about how her lungs are looking today as compared to prior x-rayPt was on breathing tx during exam, seemed SOBHx COPD, a-fib  EXAM: PORTABLE CHEST - 1 VIEW  COMPARISON:  11/09/2014  FINDINGS: There are left greater than right pleural effusions. There is vascular congestion as well as  hazy airspace opacity that predominate centrally. Cardiac silhouette is mildly enlarged and incompletely visualized.  Rib fractures noted on the right are again noted, chronicity and clear. Bony thorax is extensively demineralized. Advanced arthropathic changes of the right shoulder are noted.  IMPRESSION: 1. No significant change from the previous day's study. 2. Stable pleural effusions and pulmonary edema, like the chest or failure   Electronically Signed   By: Lajean Manes M.D.   On: 11/10/2014 09:34   US Thoracentesis Asp Pleural Space W/img Guide  11/11/2014   INDICATION: Symptomatic L sided pleural effusion  EXAM: PORTABLE US THORACENTESIS ASP PLEURAL SPACE W/IMG GUIDE  COMPARISON:  None.  MEDICATIONS: 10 cc 1% lidocaine  COMPLICATIONS: None immediate  TECHNIQUE: Informed written consent was obtained from the patient after a discussion of the risks, benefits and alternatives to treatment. A timeout was performed prior to the initiation of the procedure.  Initial ultrasound scanning demonstrates a left pleural effusion. The lower chest was prepped and draped in the usual sterile fashion. 1% lidocaine was used for local anesthesia.  Under direct ultrasound guidance, a 19 gauge, 7-cm, Yueh catheter was introduced. An ultrasound image was saved for documentation purposes. The thoracentesis was performed. The catheter was removed and a dressing was applied. The patient tolerated the  procedure well without immediate post procedural complication. The patient was escorted to have an upright chest radiograph.  FINDINGS: A total of approximately 150 cc of serous fluid was removed. Requested samples were sent to the laboratory.  IMPRESSION: Successful ultrasound-guided L sided thoracentesis yielding 150 cc of pleural fluid.  Read by:  Lavonia Drafts [facility]   Electronically Signed   By: Jerilynn Mages.  Shick M.D.   On: 11/11/2014 11:41    Scheduled Meds: . antiseptic oral rinse  7 mL Mouth Rinse BID  . arformoterol  15 mcg Nebulization BID  . budesonide  0.25 mg Nebulization 4 times per day  . ceFEPime (MAXIPIME) IV  1 g Intravenous Q12H  . furosemide  80 mg Intravenous BID  . ipratropium-albuterol  3 mL Nebulization QID  . methylPREDNISolone (SOLU-MEDROL) injection  60 mg Intravenous Q8H  . rivaroxaban  20 mg Oral Daily  . [START ON 11/12/2014] vancomycin  1,500 mg Intravenous Q24H   Continuous Infusions:   Active Problems:   Atrial fibrillation   COPD exacerbation   Respiratory failure   Acute on chronic respiratory failure   Congestive heart disease   Jaivyn Gulla K  [facility] Pager [phone]. If 7PM-7AM, please contact night-coverage at [email or web address], password [facility] 11/11/2014, 3:23 PM  LOS: 4 days

## 2014-11-12 ENCOUNTER — Inpatient Hospital Stay (HOSPITAL_COMMUNITY): Payer: Medicare Other

## 2014-11-12 ENCOUNTER — Encounter (HOSPITAL_COMMUNITY): Payer: Self-pay | Admitting: Radiology

## 2014-11-12 ENCOUNTER — Ambulatory Visit (HOSPITAL_COMMUNITY): Payer: Medicare Other

## 2014-11-12 DIAGNOSIS — J9610 Chronic respiratory failure, unspecified whether with hypoxia or hypercapnia: Secondary | ICD-10-CM

## 2014-11-12 DIAGNOSIS — J90 Pleural effusion, not elsewhere classified: Secondary | ICD-10-CM

## 2014-11-12 DIAGNOSIS — I5033 Acute on chronic diastolic (congestive) heart failure: Secondary | ICD-10-CM

## 2014-11-12 DIAGNOSIS — I482 Chronic atrial fibrillation: Secondary | ICD-10-CM

## 2014-11-12 HISTORY — DX: Pleural effusion, not elsewhere classified: J90

## 2014-11-12 LAB — CBC
HEMATOCRIT: 34.7 % — AB (ref 36.0–46.0)
HEMOGLOBIN: 10 g/dL — AB (ref 12.0–15.0)
MCH: 29.1 pg (ref 26.0–34.0)
MCHC: 28.8 g/dL — AB (ref 30.0–36.0)
MCV: 100.9 fL — ABNORMAL HIGH (ref 78.0–100.0)
PLATELETS: 315 10*3/uL (ref 150–400)
RBC: 3.44 MIL/uL — AB (ref 3.87–5.11)
RDW: 17.3 % — AB (ref 11.5–15.5)
WBC: 8.7 10*3/uL (ref 4.0–10.5)

## 2014-11-12 LAB — COMPREHENSIVE METABOLIC PANEL
ALBUMIN: 2.4 g/dL — AB (ref 3.5–5.0)
ALT: 25 U/L (ref 14–54)
ANION GAP: 10 (ref 5–15)
AST: 27 U/L (ref 15–41)
Alkaline Phosphatase: 59 U/L (ref 38–126)
BUN: 32 mg/dL — AB (ref 6–20)
CALCIUM: 8.3 mg/dL — AB (ref 8.9–10.3)
CO2: 40 mmol/L — ABNORMAL HIGH (ref 22–32)
CREATININE: 0.57 mg/dL (ref 0.44–1.00)
Chloride: 93 mmol/L — ABNORMAL LOW (ref 101–111)
GFR calc non Af Amer: >60 mL/min (ref >60)
Glucose, Bld: 163 mg/dL — ABNORMAL HIGH (ref 65–99)
Potassium: 3 mmol/L — ABNORMAL LOW (ref 3.5–5.1)
Sodium: 143 mmol/L (ref 135–145)
TOTAL PROTEIN: 6.1 g/dL — AB (ref 6.5–8.1)
Total Bilirubin: 0.4 mg/dL (ref 0.3–1.2)

## 2014-11-12 MED ORDER — IOHEXOL 300 MG/ML  SOLN
100.0000 mL | Freq: Once | INTRAMUSCULAR | Status: AC | PRN
  Administered 2014-11-12: 80 mL via INTRAVENOUS

## 2014-11-12 MED ORDER — IOHEXOL 300 MG/ML  SOLN: 80.0000 mL | Freq: Once | INTRAMUSCULAR | Status: DC | PRN

## 2014-11-12 MED ORDER — POTASSIUM CHLORIDE CRYS ER 20 MEQ PO TBCR
40.0000 meq | EXTENDED_RELEASE_TABLET | Freq: Two times a day (BID) | ORAL | Status: AC
  Administered 2014-11-12 (×2): 40 meq via ORAL
  Filled 2014-11-12 (×2): qty 2

## 2014-11-12 MED ORDER — BUDESONIDE 0.25 MG/2ML IN SUSP
0.2500 mg | Freq: Two times a day (BID) | RESPIRATORY_TRACT | Status: DC
  Administered 2014-11-12 – 2014-11-20 (×16): 0.25 mg via RESPIRATORY_TRACT
  Filled 2014-11-12 (×16): qty 2

## 2014-11-12 MED ORDER — IPRATROPIUM-ALBUTEROL 0.5-2.5 (3) MG/3ML IN SOLN: 3.0000 mL | RESPIRATORY_TRACT | Status: DC | PRN

## 2014-11-12 MED ORDER — SODIUM CHLORIDE 0.9 % IV SOLN
INTRAVENOUS | Status: DC | PRN
  Administered 2014-11-12: 21:00:00 via INTRAVENOUS

## 2014-11-12 NOTE — Care Management Note (Signed)
Case Management Note  Patient Details  Name: Jennifer Nielsen MRN: [id number] Date of Birth: Jun 25, 1924  Subjective/Objective:                   1. Acute on chronic respiratory failure 1. Secondary to likely acute CHF Action/Plan:  Discharge planning Expected Discharge Date:   (unknown)               Expected Discharge Plan:  [place]  In-House Referral:     Discharge planning Services  CM Consult  Post Acute Care Choice:    Choice offered to:     DME Arranged:    DME Agency:     HH Arranged:    [place] Agency:     Status of Service:  Completed, signed off  Medicare Important Message Given:    Date Medicare IM Given:    Medicare IM give by:    Date Additional Medicare IM Given:    Additional Medicare Important Message give by:     If discussed at [place] of Stay Meetings, dates discussed:    Additional Comments: CM notes pt from SNF [facility]) and plan to go back to SNF upon discharge; CSW following.  NO other CM needs are communicated at this time. Dellie Catholic, RN 11/12/2014, 9:40 AM

## 2014-11-12 NOTE — Progress Notes (Addendum)
[facility] PROGRESS NOTE  Jennifer Nielsen OBS:[id number] DOB: November 18, 1924 DOA: 11/07/2014 PCP: Jennifer Pardon, MD  Off Service Summary: 81yo demented female who presents with acute on chronic hypoxia requiring BiPAP. Patient was found to have pulm edema with large bilateral pleural effusions as well as leukocytosis. The patient was started on empiric abx and started on diuretics. The patient has been resistant to low-mod dosed lasix and is now on a lasix gtt with better results. The patient underwent thoracentesis with over 700cc of fluid removed. The patient is clinically better but her progress has been slow. Cardiology is following.  Of note, the patient has multiple recent admissions for the same.  Assessment/Plan: 1. Acute on chronic respiratory failure 1. Likely secondary to acute CHF with pulm edema on CXR and B compressive effusions 2. Remains on empiric ent possible sepsis with pneumonia (WBC of 20k on admit) 3. Recent EF of 50-55% on 3/16 echo. Given acuity of symptoms,with repeat EF of 60-65% 4. Pt now remains on 2LNC 5. Cont to follow daily weights: 86.9kg<-86.4kg<-85.9kg 6. Pt was initially seen by Pulmonary, thought to have insufficient amount to effusion to tap 7. Pt subsequently continued on gradually increasing doses of IV lasix - currently on lasix gtt 8. Had consulted Radiology and pt is s/p US guided thoracentesis with only 150cc fluid obtained 9. Have ordered CT chest which demonstrates continued large B effusions with resultant compressive effects. 10. Have called Radiology who will attempt a repeat therapeutic thoracentesis 11. In the meantime, have consulted Cardiology to assist with diuresis. Recommendations for continued lasix gtt for now 2. Pleural effusion 1. Per above pt is s/p US guided thoracentesis yielding 150cc of fluid 2. Continued large effusions seen on follow up CT chest 3. Have discussed with Radiology who will re-attempt thoracentesis 3. Sepsis with  HCAP 1. Clinically septic on presentation, improved 2. Pt remains on empiric vancomycin and cefepime 3. Remains afebrile 4. ARF 1. Renal function remains at or near baseline 2. Cont to monitor closely 5. Hyperkalemia/Hypokalemia 1. Was elevated on admit, resolved 2. Potassium now low with aggressive diuresis, replaced 3. Will check Mg in AM and would correct if low 6. Chronic Afib 1. Currently rate controlled 2. On PRN IV lopressor 7. Dementia 1. Stable 2. Pt remains with pleasant affect 8. DVT prophylaxis 1. SCD's  Code Status: DNR Family Communication: Pt in room Disposition Plan: Pending   Consultants: Pulmonary Radiology  Procedures:  Thoracentesis 5/30  Antibiotics:  Cefepime 5/27>>>  Vancomycin 5/27>>>  HPI/Subjective: Confused today, unable to obtain subjective  Objective: Filed Vitals:   11/12/14 0700 11/12/14 0720 11/12/14 0800 11/12/14 1138  BP: 106/42     Pulse: 68     Temp:   97.5 F (36.4 C) 97.6 F (36.4 C)  TempSrc:   Oral Axillary  Resp: 26     Height:      Weight:      SpO2: 96% 96%      Intake/Output Summary (Last 24 hours) at 11/12/14 1139 Last data filed at 11/12/14 0900  Gross per 24 hour  Intake    891 ml  Output   1075 ml  Net   -184 ml   Filed Weights   11/10/14 0400 11/11/14 0500 11/12/14 0500  Weight: 85.9 kg (189 lb 6 oz) 86.4 kg (190 lb 7.6 oz) 86.9 kg (191 lb 9.3 oz)    Exam:   General:  Asleep, arousable, [name] remains in place, in nad  Cardiovascular: regular, s1, s2  Respiratory: tachypnea,  shallow, rapid breaths, no wheezing  Abdomen: soft,nondistended  Musculoskeletal: perfused, no clubbing   Data Reviewed: Basic Metabolic Panel:  Recent Labs Lab 11/08/14 0349 11/09/14 0349 11/10/14 0405 11/11/14 0356 11/12/14 0345  NA 142 141 139 143 143  K 4.2 3.9 4.5 4.0 3.0*  CL 96* 94* 91* 93* 93*  CO2 36* 37* 38* 39* 40*  GLUCOSE 106* 113* 182* 153* 163*  BUN 32* 28* 27* 32* 32*  CREATININE 0.72 0.68  0.61 0.51 0.57  CALCIUM 8.7* 8.3* 8.7* 8.8* 8.3*   Liver Function Tests:  Recent Labs Lab 11/07/14 0845 11/08/14 0349 11/12/14 0345  AST 49* 29 27  ALT 27 22 25   ALKPHOS 101 65 59  BILITOT 0.7 0.8 0.4  PROT 7.5 6.2* 6.1*  ALBUMIN 3.3* 2.6* 2.4*   No results for input(s): LIPASE, AMYLASE in the last 168 hours. No results for input(s): AMMONIA in the last 168 hours. CBC:  Recent Labs Lab 11/07/14 0845 11/08/14 0349 11/09/14 0349 11/10/14 0405 11/11/14 0356 11/12/14 0345  WBC 20.0* 11.0* 10.4 8.5 11.2* 8.7  NEUTROABS 17.0*  --   --   --   --   --   HGB 12.1 9.8* 9.5* 10.7* 10.7* 10.0*  HCT 39.2 32.7* 32.2* 36.0 35.4* 34.7*  MCV 102.1* 100.6* 101.3* 102.0* 99.4 100.9*  PLT 296 208 235 291 344 315   Cardiac Enzymes:  Recent Labs Lab 11/07/14 0845 11/07/14 1713 11/07/14 2302  TROPONINI 0.11* 0.08* 0.06*   BNP (last 3 results)  Recent Labs  10/09/14 1404 10/09/14 1753 11/07/14 0845  BNP 248.2* 309.6* 785.7*    ProBNP (last 3 results)  Recent Labs  02/22/14 1417 06/17/14 1050  PROBNP 2244.0* 170.0*    CBG:  Recent Labs Lab 11/07/14 0841  GLUCAP 224*    Recent Results (from the past 240 hour(s))  Blood culture (routine x 2)     Status: None (Preliminary result)   Collection Time: 11/07/14 10:01 AM  Result Value Ref Range Status   Specimen Description BLOOD RIGHT HAND  Final   Special Requests BOTTLES DRAWN AEROBIC AND ANAEROBIC 5ML  Final   Culture   Final           BLOOD CULTURE RECEIVED NO GROWTH TO DATE CULTURE WILL BE HELD FOR 5 DAYS BEFORE ISSUING A FINAL NEGATIVE REPORT Performed at [facility]    Report Status PENDING  Incomplete  Blood culture (routine x 2)     Status: None (Preliminary result)   Collection Time: 11/07/14 10:01 AM  Result Value Ref Range Status   Specimen Description BLOOD RIGHT WRIST  Final   Special Requests BOTTLES DRAWN AEROBIC AND ANAEROBIC 5CC  Final   Culture   Final           BLOOD CULTURE  RECEIVED NO GROWTH TO DATE CULTURE WILL BE HELD FOR 5 DAYS BEFORE ISSUING A FINAL NEGATIVE REPORT Performed at [facility]    Report Status PENDING  Incomplete  MRSA PCR Screening     Status: None   Collection Time: 11/07/14  1:41 PM  Result Value Ref Range Status   MRSA by PCR NEGATIVE NEGATIVE Final    Comment:        The GeneXpert MRSA Assay (FDA approved for NASAL specimens only), is one component of a comprehensive MRSA colonization surveillance program. It is not intended to diagnose MRSA infection nor to guide or monitor treatment for MRSA infections.   Body fluid culture  Status: None (Preliminary result)   Collection Time: 11/11/14 11:35 AM  Result Value Ref Range Status   Specimen Description PLEURAL LEFT  Final   Special Requests NONE  Final   Gram Stain   Final    FEW WBC PRESENT,BOTH PMN AND MONONUCLEAR NO ORGANISMS SEEN Performed at [facility]    Culture NO GROWTH Performed at [facility]   Final   Report Status PENDING  Incomplete     Studies: Ct Chest W Contrast  11/12/2014   CLINICAL DATA:  Evaluate pleural effusion. Status post left-sided thoracentesis on 11/11/2014, with removal 150 cc of pleural fluid. History of COPD, CHF, and respiratory failure.  EXAM: CT CHEST WITH CONTRAST  TECHNIQUE: Multidetector CT imaging of the chest was performed during intravenous contrast administration.  CONTRAST:  66mL OMNIPAQUE IOHEXOL 300 MG/ML  SOLN  COMPARISON:  Chest radiographs 11/12/2014, 11/11/2014, 11/10/2014, 11/09/2014. CT chest 10/11/2013. CT abdomen 11/02/2010.  FINDINGS: There are symmetric, moderate-to-large, simple pleural effusions bilaterally. No evidence of pleural enhancement or nodularity.  Moderate stable cardiomegaly with dilatation of the bilateral atria. Coronary artery atherosclerotic calcification noted. Negative for pericardial effusion.  Thoracic aorta is normal in caliber and contains scattered atherosclerotic  calcification. Proximal great vessels are unremarkable. Atherosclerotic calcification is seen in the right carotid artery at the base the neck.  There is rightward deviation of the esophagus by the enlarged left atrium. Esophagus otherwise unremarkable.  Negative for lymphadenopathy.  There is respiratory motion on the lung windows. There is compressive atelectasis of both lower lobes due to the overlying pleural effusions. No airspace consolidation, pulmonary edema, or interstitial abnormality is appreciated. No nodule or mass is seen, but sensitivity for detecting small nodules is decreased by patient respiratory motion.  The patient is kyphotic, and has chronic thoracic vertebral body compression fractures involving T5, T6, and T7. No new thoracic spine compression fractures compared to the CT of 10/12/2014.  Chronic right adrenal gland nodules are unchanged compared to abdominal CT of 2012. These are likely benign adenomas.  IMPRESSION: 1. Bilateral and symmetric simple pleural effusions, moderate-to-large in size. 2. Negative for empyema. 3. Significant compressive atelectasis of the lower lobes secondary to the overlying pleural effusions. 4. Respiratory motion limits detail of the lung parenchyma. No definite pulmonary edema. 5. Cardiomegaly and coronary artery atherosclerotic calcification. 6. Stable right adrenal gland nodularity dating back to 2012. These likely reflect benign adenomas. 7. Chronic T5, T6, and T7 compression fractures.   Electronically Signed   By: Curlene Dolphin M.D.   On: 11/12/2014 11:00   Dg Chest Port 1 View  11/12/2014   CLINICAL DATA:  Systolic congestive heart failure.  EXAM: PORTABLE CHEST - 1 VIEW  COMPARISON:  11/11/2014 and 11/10/2014.  FINDINGS: 0456 hr. Patient remains rotated to the right. Cardiomegaly and aortic ectasia appear unchanged. There are stable left-greater-than-right pleural effusions with associated basilar airspace opacities. The pulmonary vasculature remain  somewhat indistinct. There is no pneumothorax. Asymmetric glenohumeral degenerative changes are again noted on the right.  IMPRESSION: No significant change in pleural effusions, bibasilar airspace opacities and mild interstitial edema.   Electronically Signed   By: Richardean Sale M.D.   On: 11/12/2014 07:17   Dg Chest Port 1 View  11/11/2014   CLINICAL DATA:  Pleural effusion.  EXAM: PORTABLE CHEST - 1 VIEW  COMPARISON:  Single view of the chest 11/10/2014.  FINDINGS: Left worse than right pleural effusions and basilar airspace disease are again seen. No pneumothorax identified. Heart size  appears enlarged. Mild appearing interstitial edema is again seen.  IMPRESSION: Left worse than right pleural effusions and basilar airspace disease.  Cardiomegaly and mild appearing interstitial edema.   Electronically Signed   By: Inge Rise M.D.   On: 11/11/2014 12:40   US Thoracentesis Asp Pleural Space W/img Guide  11/11/2014   INDICATION: Symptomatic L sided pleural effusion  EXAM: PORTABLE US THORACENTESIS ASP PLEURAL SPACE W/IMG GUIDE  COMPARISON:  None.  MEDICATIONS: 10 cc 1% lidocaine  COMPLICATIONS: None immediate  TECHNIQUE: Informed written consent was obtained from the patient after a discussion of the risks, benefits and alternatives to treatment. A timeout was performed prior to the initiation of the procedure.  Initial ultrasound scanning demonstrates a left pleural effusion. The lower chest was prepped and draped in the usual sterile fashion. 1% lidocaine was used for local anesthesia.  Under direct ultrasound guidance, a 19 gauge, 7-cm, Yueh catheter was introduced. An ultrasound image was saved for documentation purposes. The thoracentesis was performed. The catheter was removed and a dressing was applied. The patient tolerated the procedure well without immediate post procedural complication. The patient was escorted to have an upright chest radiograph.  FINDINGS: A total of approximately 150 cc  of serous fluid was removed. Requested samples were sent to the laboratory.  IMPRESSION: Successful ultrasound-guided L sided thoracentesis yielding 150 cc of pleural fluid.  Read by:  Lavonia Drafts [facility]   Electronically Signed   By: Jerilynn Mages.  Shick M.D.   On: 11/11/2014 11:41    Scheduled Meds: . antiseptic oral rinse  7 mL Mouth Rinse BID  . arformoterol  15 mcg Nebulization BID  . budesonide  0.25 mg Nebulization BID  . ceFEPime (MAXIPIME) IV  1 g Intravenous Q12H  . methylPREDNISolone (SOLU-MEDROL) injection  60 mg Intravenous Q8H  . potassium chloride  40 mEq Oral BID  . rivaroxaban  20 mg Oral Daily  . vancomycin  1,500 mg Intravenous Q24H   Continuous Infusions: . furosemide (LASIX) infusion 8 mg/hr (11/12/14 0900)    Active Problems:   Atrial fibrillation   COPD exacerbation   Respiratory failure   Acute on chronic respiratory failure   Congestive heart disease   CHIU, STEPHEN K  [facility] Pager [phone]. If 7PM-7AM, please contact night-coverage at [email or web address], password [facility] 11/12/2014, 11:39 AM  LOS: 5 days

## 2014-11-12 NOTE — Procedures (Signed)
Ultrasound-guided therapeutic left thoracentesis performed yielding 650 cc's of slightly hazy, yellow fluid. No immediate complications. Follow-up chest x-ray pending.

## 2014-11-12 NOTE — Progress Notes (Addendum)
FL2 / DNR in chart for MD signature.CSW following to assist with d/c planning. Clinicals sent to [facility] for review. Family is hopeful pt will progress and return to [facility] when stable. CSW will continue to follow to assist with d/c planning.  Werner Lean LCSW [phone]

## 2014-11-12 NOTE — Consult Note (Signed)
Patient ID: Karessa Onorato MRN: [id number], DOB/AGE: 79-05-1925   Admit date: 11/07/2014   Primary Physician: [name], MD Primary Cardiologist: Dr. Meda Coffee  Pt. Profile:  81 y/o female with h/o chronic diastolic HF, chronic atrial fibrillation on Xarelto, admitted for acute respiratory failure, in the setting of sepsis with HCAP, acute CHF, and bilateral pleural effusions.   Problem List  Past Medical History  Diagnosis Date  . Arrhythmia   . Arthritis   . Atrial fibrillation     Remains stable (As of 05/09/13)  . H/O blood clots   . Hyperlipemia   . GERD (gastroesophageal reflux disease)     Stable (As of 05/09/13)  . Osteoarthritis     Denies pain (As of 05/09/13)  . CHF (congestive heart failure)     chronic diastolic  . COPD (chronic obstructive pulmonary disease)     w/exacerbation  . Dementia     Remains stable & continues to function adequately in the current living environment with supervision. Has had little changes in behavior (AS OF 05/09/13)    Past Surgical History  Procedure Laterality Date  . Appendectomy    . Gallbladder surgery    . Hip surgery      Right-Metal plate   . Leg surgery      Left leg     Allergies  Allergies  Allergen Reactions  . Chocolate Itching  . Ciprofloxacin Itching  . Coffee Bean Extract [Coffea Arabica] Itching  . Coffee Flavor Itching  . Eggs Or Egg-Derived Products   . Gentamycin [Gentamicin] Itching  . Imodium [Loperamide] Other (See Comments)    unknown  . Penicillins Itching  . Sulfa Antibiotics Itching  . Tea Itching    HPI  Note: history is limited by patient status with baseline dementia. Most of history is obtained by chart review.   The patient is a 81 y/o female, followed by Dr. Meda Coffee, with a history of chronic atrial fibrillation on Xarelto, chronic diastolic CHF, DVT, COPD, GERD and dementia. 2D echo 06/2013 showed normal LVF with EF at 55-60%. She fractured her hip 2 years ago and is wheelchair  bound. She resides at [facility]. She was last seen by Dr. Meda Coffee 06/17/14, in clinic, and was felt to be stable from a cardiac standpoint. She was instructed to f/u in 6 months.   Per chart review, patient was noted to have increased LEE while at SNF. Her lasix dose was reportedly increased, however patient became less responsive with labored breathing and was transported to the [facility] ED on 11/07/14. On arrival, she was tachypneic  with RR of 36, slightly tachycardic with afib and HR in the low 100s, leukocytosis with WBC of 20, lactic acid of 4.7, elevated bnp compared to baseline (785 on admit), mild elevation of troponin (0.11, 0.08, 0.06), mild hyperkalemia, and elevated Scr of 1.09 (baseline 0.49). ABG with co2 retention, ph 7.3. CXR showed bilateral pleural effusion vs ? Infiltration. She was placed on bipap, IV lasix and started on empiric broad spectrum antibiotics. Admitted by Internal Medicine. Critical Care consulted 5/27 for pleural effusions. Evaluated with beside [place], however not enough fluid for thoracentesis. Diuresis was recommended. Repeat 2D echo obtained 5/30, demonstrated preserved LVF with EF of 60-65%. Normal wall motion. The study was not technically sufficient to allow evaluation of LV diastolic function. Mild AI and MR noted. Systolic pressure was moderately to severely increased in the pulmonary arteries. PA peak pressure 59 mm Hg. No pericardial effusion. Despite treatment  with IV Lasix and antibiotics, patient remained tachypneic. Repeat CXR obtained 11/12/14 shows no significant change in pleural effusions, bibasilar airspace opacities and mild interstitial edema. Cardiology has been consulted for assistance managing her CHF.     Home Medications  Prior to Admission medications   Medication Sig Start Date End Date Taking? Authorizing Provider  acetaminophen (TYLENOL) 500 MG tablet Take 500 mg by mouth 3 (three) times daily.    Yes Historical Provider, MD  budesonide-formoterol  (SYMBICORT) 160-4.5 MCG/ACT inhaler Inhale 2 puffs into the lungs 2 (two) times daily.   Yes Historical Provider, MD  Calcium Carbonate-Vitamin D (CALTRATE 600+D PO) Take 1 tablet by mouth every morning. For calcium supplement   Yes Historical Provider, MD  digoxin (LANOXIN) 0.25 MG tablet Take 0.25 mg by mouth every other day. Alternate between  0.125 and 0.250   Yes Historical Provider, MD  diltiazem (CARDIZEM CD) 120 MG 24 hr capsule Take 120 mg by mouth daily. For A-Fib 07/02/13  Yes Bobby Rumpf York, PA-C  Docusate Sodium (DSS) 100 MG CAPS Take 100 mg by mouth daily. For constipation 07/02/13  Yes Bobby Rumpf York, PA-C  furosemide (LASIX) 40 MG tablet Take 40 mg by mouth 2 (two) times daily.   Yes Historical Provider, MD  guaiFENesin (MUCINEX) 600 MG 12 hr tablet Take 1 tablet (600 mg total) by mouth 2 (two) times daily. 09/18/14  Yes Bonnielee Haff, MD  ipratropium-albuterol (DUONEB) 0.5-2.5 (3) MG/3ML SOLN 75ml nebulized 4 times daily scheduled and every 4hrs as needed for wheezing for 3 days. And then every 4hours as needed for wheezing. 09/18/14  Yes Bonnielee Haff, MD  Multiple Vitamins-Minerals (DECUBI-VITE) CAPS Take 1 capsule by mouth daily.   Yes Historical Provider, MD  Nutritional Supplements (NUTRITIONAL DRINK PO) Take 120 mLs by mouth 2 (two) times daily. MIGHTY SHAKES   Yes Historical Provider, MD  Polyethyl Glycol-Propyl Glycol (SYSTANE OP) Place 1 drop into both eyes 2 (two) times daily. For dry eyes   Yes Historical Provider, MD  potassium chloride SA (K-DUR,KLOR-CON) 20 MEQ tablet Take 1 tablet (20 mEq total) by mouth daily. 10/12/14  Yes Theodis Blaze, MD  Protein (PROCEL) POWD Take 2 scoop by mouth 2 (two) times daily.   Yes Historical Provider, MD  Rivaroxaban (XARELTO) 20 MG TABS tablet Take 20 mg by mouth daily. For DVT   Yes Historical Provider, MD  azithromycin (ZITHROMAX) 250 MG tablet Take 1 tablet (250 mg total) by mouth daily. Patient not taking: Reported on 11/07/2014 10/12/14    Theodis Blaze, MD  digoxin (LANOXIN) 0.125 MG tablet Take 0.125 mg by mouth every other day.    Historical Provider, MD    Family History  Family History  Problem Relation Age of Onset  . Colon cancer Neg Hx   . Heart disease Father   . Breast cancer Daughter     Social History  History   Social History  . Marital Status: Single    Spouse Name: N/A  . Number of Children: 2  . Years of Education: N/A   Occupational History  . Retired     Pharmacist, hospital   Social History Main Topics  . Smoking status: Former Research scientist (life sciences)  . Smokeless tobacco: Never Used  . Alcohol Use: No  . Drug Use: No  . Sexual Activity: No   Other Topics Concern  . Not on file   Social History Narrative   No caffeine      Review of Systems General:  No chills, fever, night sweats or weight changes.  Cardiovascular:  No chest pain, dyspnea on exertion, edema, orthopnea, palpitations, paroxysmal nocturnal dyspnea. Dermatological: No rash, lesions/masses Respiratory: No cough, dyspnea Urologic: No hematuria, dysuria Abdominal:   No nausea, vomiting, diarrhea, bright red blood per rectum, melena, or hematemesis Neurologic:  No visual changes, wkns, changes in mental status. All other systems reviewed and are otherwise negative except as noted above.  Physical Exam  Blood pressure 106/42, pulse 68, temperature 97.5 F (36.4 C), temperature source Oral, resp. rate 26, height 5\' 7"  (1.702 m), weight 191 lb 9.3 oz (86.9 kg), SpO2 96 %.  General: Pleasant, NAD Psych: Normal affect. Neuro: Alert and oriented X 3. Moves all extremities spontaneously. HEENT: Normal  Neck: Supple without bruits or JVD. Lungs:  Unlabored. Decreased BS at the bases with faint rales Heart: irregularly irregular rhythm, regular rate Abdomen: Soft, non-tender, non-distended, BS + x 4.  Extremities: No clubbing, cyanosis or edema. DP/PT/Radials 2+ and equal bilaterally.  Labs  Troponin (Point of Care Test) No results for  input(s): TROPIPOC in the last 72 hours. No results for input(s): CKTOTAL, CKMB, TROPONINI in the last 72 hours. Lab Results  Component Value Date   WBC 8.7 11/12/2014   HGB 10.0* 11/12/2014   HCT 34.7* 11/12/2014   MCV 100.9* 11/12/2014   PLT 315 11/12/2014    Recent Labs Lab 11/12/14 0345  NA 143  K 3.0*  CL 93*  CO2 40*  BUN 32*  CREATININE 0.57  CALCIUM 8.3*  PROT 6.1*  BILITOT 0.4  ALKPHOS 59  ALT 25  AST 27  GLUCOSE 163*   No results found for: CHOL, HDL, LDLCALC, TRIG No results found for: DDIMER   Radiology/Studies  Dg Chest 2 View  11/07/2014   CLINICAL DATA:  Shortness breath. Under ext bouts of with labored breathing. Transfer from skilled nursing facility.  EXAM: CHEST - 2 VIEW  COMPARISON:  CT of the chest 10/12/2014 and chest x-ray 10/11/2014.  FINDINGS: Heart is enlarged. Bilateral pleural effusions have increased, left greater than right. Mild edema is noted. Bibasilar airspace opacities are present. Degenerative changes are again noted in both shoulders.  IMPRESSION: 1. Increased interstitial edema and bilateral pleural effusions compatible with congestive heart failure. 2. Bibasilar airspace disease likely reflects atelectasis.   Electronically Signed   By: San Morelle M.D.   On: 11/07/2014 10:46   Dg Chest Port 1 View  11/12/2014   CLINICAL DATA:  Systolic congestive heart failure.  EXAM: PORTABLE CHEST - 1 VIEW  COMPARISON:  11/11/2014 and 11/10/2014.  FINDINGS: 0456 hr. Patient remains rotated to the right. Cardiomegaly and aortic ectasia appear unchanged. There are stable left-greater-than-right pleural effusions with associated basilar airspace opacities. The pulmonary vasculature remain somewhat indistinct. There is no pneumothorax. Asymmetric glenohumeral degenerative changes are again noted on the right.  IMPRESSION: No significant change in pleural effusions, bibasilar airspace opacities and mild interstitial edema.   Electronically Signed   By:  Richardean Sale M.D.   On: 11/12/2014 07:17   Dg Chest Port 1 View  11/11/2014   CLINICAL DATA:  Pleural effusion.  EXAM: PORTABLE CHEST - 1 VIEW  COMPARISON:  Single view of the chest 11/10/2014.  FINDINGS: Left worse than right pleural effusions and basilar airspace disease are again seen. No pneumothorax identified. Heart size appears enlarged. Mild appearing interstitial edema is again seen.  IMPRESSION: Left worse than right pleural effusions and basilar airspace disease.  Cardiomegaly and mild appearing interstitial edema.  Electronically Signed   By: Inge Rise M.D.   On: 11/11/2014 12:40   Dg Chest Port 1 View  11/10/2014   CLINICAL DATA:  Pt diagnosed with CHF, MD concerned about how her lungs are looking today as compared to prior x-rayPt was on breathing tx during exam, seemed SOBHx COPD, a-fib  EXAM: PORTABLE CHEST - 1 VIEW  COMPARISON:  11/09/2014  FINDINGS: There are left greater than right pleural effusions. There is vascular congestion as well as hazy airspace opacity that predominate centrally. Cardiac silhouette is mildly enlarged and incompletely visualized.  Rib fractures noted on the right are again noted, chronicity and clear. Bony thorax is extensively demineralized. Advanced arthropathic changes of the right shoulder are noted.  IMPRESSION: 1. No significant change from the previous day's study. 2. Stable pleural effusions and pulmonary edema, like the chest or failure   Electronically Signed   By: Lajean Manes M.D.   On: 11/10/2014 09:34   Dg Chest Port 1 View  11/09/2014   CLINICAL DATA:  Shortness of breath.  EXAM: PORTABLE CHEST - 1 VIEW  COMPARISON:  11/07/2014  FINDINGS: The cardiac enlargement is noted. There are moderate to large bilateral pleural effusions noted left greater night. This is similar in volume to previous exam. Increase in interstitial edema. Chronic right posterior rib fracture deformities are again noted.  IMPRESSION: 1. No change in bilateral pleural  effusions left greater night. 2. Increase in pulmonary edema.   Electronically Signed   By: Kerby Moors M.D.   On: 11/09/2014 13:58   US Thoracentesis Asp Pleural Space W/img Guide  11/11/2014   INDICATION: Symptomatic L sided pleural effusion  EXAM: PORTABLE US THORACENTESIS ASP PLEURAL SPACE W/IMG GUIDE  COMPARISON:  None.  MEDICATIONS: 10 cc 1% lidocaine  COMPLICATIONS: None immediate  TECHNIQUE: Informed written consent was obtained from the patient after a discussion of the risks, benefits and alternatives to treatment. A timeout was performed prior to the initiation of the procedure.  Initial ultrasound scanning demonstrates a left pleural effusion. The lower chest was prepped and draped in the usual sterile fashion. 1% lidocaine was used for local anesthesia.  Under direct ultrasound guidance, a 19 gauge, 7-cm, Yueh catheter was introduced. An ultrasound image was saved for documentation purposes. The thoracentesis was performed. The catheter was removed and a dressing was applied. The patient tolerated the procedure well without immediate post procedural complication. The patient was escorted to have an upright chest radiograph.  FINDINGS: A total of approximately 150 cc of serous fluid was removed. Requested samples were sent to the laboratory.  IMPRESSION: Successful ultrasound-guided L sided thoracentesis yielding 150 cc of pleural fluid.  Read by:  Lavonia Drafts [facility]   Electronically Signed   By: Jerilynn Mages.  Shick M.D.   On: 11/11/2014 11:41    ECG  11/07/14: EKG-  Atrial Fibrillation, HR 109 bpm 11/12/14: Telemetry- Atrial Fibrillation, HR 76 bpm  Echocardiogram  11/10/14 Study Conclusions  - Left ventricle: The cavity size was normal. Wall thickness was at the upper limits of normal. Systolic function was normal. The estimated ejection fraction was in the range of 60% to 65%. Wall motion was normal; there were no regional wall motion abnormalities. The study is not technically  sufficient to allow evaluation of LV diastolic function. - Aortic valve: Mildly calcified annulus. Trileaflet. There was mild regurgitation. - Mitral valve: Calcified annulus. There was mild regurgitation. - Left atrium: The atrium was moderately dilated. - Right ventricle: Systolic function was  mildly reduced. - Right atrium: Central venous pressure (est): 8 mm Hg. - Tricuspid valve: There was mild regurgitation. - Pulmonary arteries: Systolic pressure was moderately to severely increased. PA peak pressure: 59 mm Hg (S). - Pericardium, extracardiac: There was no pericardial effusion.  Impressions:  - Upper normal LV wall thickness with LVEF 60-65%, indeterminate diastolic function. Moderate left atrial enlargement. Mild mitral regurgitation. Mildly sclerotic aortic valve with mild aortic regurgitation. Mildly reduced RV contraction. Mild tricuspid regurgitation with PASP 59 mmHg, moderate to severe pulmonary hypertension.    ASSESSMENT AND PLAN  Active Problems:   Atrial fibrillation   COPD exacerbation   Respiratory failure   Acute on chronic respiratory failure   Congestive heart disease   1. Acute on Chronic Diastolic CHF: admit BNP 275.1. 2D echo 5/29 shows normal LVF with EF at 60-65%. F/u CXR today shows no change in pleural effusions and mild interstitial edema. On Lasix infusion. Good diuretic response yesterday. UOP -1.5 L. I/Os net negative 3.4 L out since admit. Renal function is normal with SCr at 0.57 today. Continue diuresis with IV Lasix drip. Continue strict I/Os.   2. Chronic Atrial Fibrillation: rate is well controlled in the 70s. Continue Xarelto for a/c. Continue on telemetry.   3. Hypokalemia: K is 3.0 today. Supplement with K-dur. Monitor closely in the setting of IV diuretic therapy.     Signed, Lyda Jester, PA-C 11/12/2014, 9:15 AM Patient seen and examined and history reviewed. Agree with above findings and plan. 81 yo WF  admitted from NH with acute respiratory distress. Findings of CHF with bilateral pleural effusions and elevated BNP. Pleural tap not very revealing. Appears transudative but minimal fluid removed. On IV lasix infusion with improved diuresis. She has chronic AFib with controlled rate. Echo shows normal LV function with pulmonary HTN. Findings all consistent with acute on chronic diastolic CHf. Digoxin appropriately held with elevated level 2.4 and hypokalemia. Need to replete potassium. We will follow with you.  Peter Martinique, [place] 11/12/2014 2:12 PM

## 2014-11-13 DIAGNOSIS — Z9889 Other specified postprocedural states: Secondary | ICD-10-CM

## 2014-11-13 DIAGNOSIS — I5031 Acute diastolic (congestive) heart failure: Secondary | ICD-10-CM

## 2014-11-13 DIAGNOSIS — J9601 Acute respiratory failure with hypoxia: Secondary | ICD-10-CM

## 2014-11-13 LAB — BASIC METABOLIC PANEL
Anion gap: 11 (ref 5–15)
BUN: 33 mg/dL — AB (ref 6–20)
CHLORIDE: 95 mmol/L — AB (ref 101–111)
CO2: 44 mmol/L — AB (ref 22–32)
Calcium: 8.6 mg/dL — ABNORMAL LOW (ref 8.9–10.3)
Creatinine, Ser: 0.58 mg/dL (ref 0.44–1.00)
GFR calc Af Amer: >60 mL/min (ref >60)
GFR calc non Af Amer: >60 mL/min (ref >60)
GLUCOSE: 165 mg/dL — AB (ref 65–99)
POTASSIUM: 3.3 mmol/L — AB (ref 3.5–5.1)
Sodium: 150 mmol/L — ABNORMAL HIGH (ref 135–145)

## 2014-11-13 LAB — CBC
HCT: 38.4 % (ref 36.0–46.0)
Hemoglobin: 11.6 g/dL — ABNORMAL LOW (ref 12.0–15.0)
MCH: 30.9 pg (ref 26.0–34.0)
MCHC: 30.2 g/dL (ref 30.0–36.0)
MCV: 102.1 fL — ABNORMAL HIGH (ref 78.0–100.0)
Platelets: 342 10*3/uL (ref 150–400)
RBC: 3.76 MIL/uL — AB (ref 3.87–5.11)
RDW: 17.4 % — AB (ref 11.5–15.5)
WBC: 8.7 10*3/uL (ref 4.0–10.5)

## 2014-11-13 LAB — MAGNESIUM: MAGNESIUM: 2.1 mg/dL (ref 1.7–2.4)

## 2014-11-13 LAB — CULTURE, BLOOD (ROUTINE X 2)
Culture: NO GROWTH
Culture: NO GROWTH

## 2014-11-13 LAB — AMYLASE, PLEURAL FLUID: AMYLASE, PLEURAL FLUID: 8 U/L

## 2014-11-13 MED ORDER — ENSURE ENLIVE PO LIQD
237.0000 mL | Freq: Two times a day (BID) | ORAL | Status: DC
  Administered 2014-11-13 – 2014-11-20 (×8): 237 mL via ORAL

## 2014-11-13 MED ORDER — POTASSIUM CL IN DEXTROSE 5% 20 MEQ/L IV SOLN
20.0000 meq | INTRAVENOUS | Status: DC
  Administered 2014-11-13 – 2014-11-14 (×2): 20 meq via INTRAVENOUS
  Filled 2014-11-13 (×4): qty 1000

## 2014-11-13 NOTE — Progress Notes (Signed)
[facility] PROGRESS NOTE  Jennifer Nielsen JAS:[id number] DOB: [birth year] DOA: 11/07/2014 PCP: [name], MD  Off Service Summary:    81 yo demented female who presents with acute on chronic hypoxia secondary to chronic diastolic heart failure requiring BiPAP. Patient was found to have pulm edema with large bilateral pleural effusions as well as leukocytosis. The patient was started on empiric abx and started on diuretics. The patient has been resistant to low-mod dosed lasix and is now on a lasix gtt with better results. The patient underwent thoracentesis with over 700cc of fluid removed. The patient is clinically better but her progress has been slow. Cardiology is following.  Of note, the patient has multiple recent admissions for the same.  Assessment/Plan: 1. Acute on chronic respiratory failure due to acute on chronic diastolic heart failure and possible sepsis secondary to pneumonia 1. D/c antibiotics 2. Recent EF of 50-55% on 3/16 echo. Given acuity of symptoms,with repeat EF of 60-65% 3. Pt now remains on 2LNC 4. Cont to follow daily weights: 86.1kg > 81.kg  5. continue lasix gtt 6. Appreciate cardiology assistance 2. Left pleural effusion,  1. Continued large effusions seen on follow up CT chest 2. s/p US guided thoracentesis yielding 150cc of fluid initially, but repeat thoracentesis on 5/31 yielded 662mL yellow fluid 3. Appreciate radiology assistance 3. Sepsis with HCAP 1. Clinically septic on presentation, improved 2. D/c vancomycin and cefepime 3. Remains afebrile 4. ARF 1. Renal function remains at or near baseline 2. Cont to monitor closely 5. Hyperkalemia/Hypokalemia 1. Was elevated on admit, resolved 2. Potassium now low with aggressive diuresis, replaced 3. Will check Mg in AM and would correct if low 6. Chronic Afib 1. Currently rate controlled 2. On PRN IV lopressor 7. Dementia, at baseline is oriented to person, place and time and can carry on coherent  conversation.   1. Stable 2. Pt remains with pleasant affect 8. Hypernatremia likely due to poor free water intake from dementia while being diuresed 1. Start D5W and continue diuresis per cardiology 9. Hypokalemia 1. Add potassium to IVF 10. Macrocytic anemia 1. Iron studies, B12, folate, TSH, occult stool 11. DVT prophylaxis 1. SCD's  Code Status: DNR Family Communication: Pt in room Disposition Plan: Pending improvement in dyspnea   Consultants: Pulmonary Radiology  Procedures:  Thoracentesis 5/30  Antibiotics:  Cefepime 5/27>>> 6/1  Vancomycin 5/27>>> 6/1  HPI/Subjective: Confused today, but nods and shakes head.  Denies HA, nausea, chest pain, abdominal pain.  Does not respond when asked if she has difficulty breathing.    Objective: Filed Vitals:   11/13/14 0300 11/13/14 0400 11/13/14 0449 11/13/14 0600  BP:  148/91  128/66  Pulse:  73  74  Temp: 98 F (36.7 C)     TempSrc: Oral     Resp:  32  24  Height:      Weight:   81 kg (178 lb 9.2 oz)   SpO2:  96%  93%    Intake/Output Summary (Last 24 hours) at 11/13/14 0704 Last data filed at 11/13/14 0600  Gross per 24 hour  Intake 1638.53 ml  Output   1400 ml  Net 238.53 ml   Filed Weights   11/11/14 0500 11/12/14 0500 11/13/14 0449  Weight: 86.4 kg (190 lb 7.6 oz) 86.9 kg (191 lb 9.3 oz) 81 kg (178 lb 9.2 oz)    Exam:   General:  Awake, arousable, [name] remains in place, in nad  Cardiovascular: IRRR, s1, s2  Respiratory: tachypnea, shallow, rapid  breaths, no wheezing, rales, or rhonchi  Abdomen: soft,nondistended  Musculoskeletal: perfused, no clubbing, 1+ pitting edema bilateral lower extremities  Data Reviewed: Basic Metabolic Panel:  Recent Labs Lab 11/09/14 0349 11/10/14 0405 11/11/14 0356 11/12/14 0345 11/13/14 0345  NA 141 139 143 143 150*  K 3.9 4.5 4.0 3.0* 3.3*  CL 94* 91* 93* 93* 95*  CO2 37* 38* 39* 40* 44*  GLUCOSE 113* 182* 153* 163* 165*  BUN 28* 27* 32* 32* 33*   CREATININE 0.68 0.61 0.51 0.57 0.58  CALCIUM 8.3* 8.7* 8.8* 8.3* 8.6*  MG  --   --   --   --  2.1   Liver Function Tests:  Recent Labs Lab 11/07/14 0845 11/08/14 0349 11/12/14 0345  AST 49* 29 27  ALT 27 22 25   ALKPHOS 101 65 59  BILITOT 0.7 0.8 0.4  PROT 7.5 6.2* 6.1*  ALBUMIN 3.3* 2.6* 2.4*   No results for input(s): LIPASE, AMYLASE in the last 168 hours. No results for input(s): AMMONIA in the last 168 hours. CBC:  Recent Labs Lab 11/07/14 0845  11/09/14 0349 11/10/14 0405 11/11/14 0356 11/12/14 0345 11/13/14 0345  WBC 20.0*  < > 10.4 8.5 11.2* 8.7 8.7  NEUTROABS 17.0*  --   --   --   --   --   --   HGB 12.1  < > 9.5* 10.7* 10.7* 10.0* 11.6*  HCT 39.2  < > 32.2* 36.0 35.4* 34.7* 38.4  MCV 102.1*  < > 101.3* 102.0* 99.4 100.9* 102.1*  PLT 296  < > 235 291 344 315 342  < > = values in this interval not displayed. Cardiac Enzymes:  Recent Labs Lab 11/07/14 0845 11/07/14 1713 11/07/14 2302  TROPONINI 0.11* 0.08* 0.06*   BNP (last 3 results)  Recent Labs  10/09/14 1404 10/09/14 1753 11/07/14 0845  BNP 248.2* 309.6* 785.7*    ProBNP (last 3 results)  Recent Labs  02/22/14 1417 06/17/14 1050  PROBNP 2244.0* 170.0*    CBG:  Recent Labs Lab 11/07/14 0841  GLUCAP 224*    Recent Results (from the past 240 hour(s))  Blood culture (routine x 2)     Status: None (Preliminary result)   Collection Time: 11/07/14 10:01 AM  Result Value Ref Range Status   Specimen Description BLOOD RIGHT HAND  Final   Special Requests BOTTLES DRAWN AEROBIC AND ANAEROBIC 5ML  Final   Culture   Final           BLOOD CULTURE RECEIVED NO GROWTH TO DATE CULTURE WILL BE HELD FOR 5 DAYS BEFORE ISSUING A FINAL NEGATIVE REPORT Performed at [facility]    Report Status PENDING  Incomplete  Blood culture (routine x 2)     Status: None (Preliminary result)   Collection Time: 11/07/14 10:01 AM  Result Value Ref Range Status   Specimen Description BLOOD RIGHT WRIST   Final   Special Requests BOTTLES DRAWN AEROBIC AND ANAEROBIC 5CC  Final   Culture   Final           BLOOD CULTURE RECEIVED NO GROWTH TO DATE CULTURE WILL BE HELD FOR 5 DAYS BEFORE ISSUING A FINAL NEGATIVE REPORT Performed at [facility]    Report Status PENDING  Incomplete  MRSA PCR Screening     Status: None   Collection Time: 11/07/14  1:41 PM  Result Value Ref Range Status   MRSA by PCR NEGATIVE NEGATIVE Final    Comment:  The GeneXpert MRSA Assay (FDA approved for NASAL specimens only), is one component of a comprehensive MRSA colonization surveillance program. It is not intended to diagnose MRSA infection nor to guide or monitor treatment for MRSA infections.   Body fluid culture     Status: None (Preliminary result)   Collection Time: 11/11/14 11:35 AM  Result Value Ref Range Status   Specimen Description PLEURAL LEFT  Final   Special Requests NONE  Final   Gram Stain   Final    FEW WBC PRESENT,BOTH PMN AND MONONUCLEAR NO ORGANISMS SEEN Performed at [facility]    Culture NO GROWTH Performed at [facility]   Final   Report Status PENDING  Incomplete     Studies: Ct Chest W Contrast  11/12/2014   CLINICAL DATA:  Evaluate pleural effusion. Status post left-sided thoracentesis on 11/11/2014, with removal 150 cc of pleural fluid. History of COPD, CHF, and respiratory failure.  EXAM: CT CHEST WITH CONTRAST  TECHNIQUE: Multidetector CT imaging of the chest was performed during intravenous contrast administration.  CONTRAST:  95mL OMNIPAQUE IOHEXOL 300 MG/ML  SOLN  COMPARISON:  Chest radiographs 11/12/2014, 11/11/2014, 11/10/2014, 11/09/2014. CT chest 10/11/2013. CT abdomen 11/02/2010.  FINDINGS: There are symmetric, moderate-to-large, simple pleural effusions bilaterally. No evidence of pleural enhancement or nodularity.  Moderate stable cardiomegaly with dilatation of the bilateral atria. Coronary artery atherosclerotic calcification noted.  Negative for pericardial effusion.  Thoracic aorta is normal in caliber and contains scattered atherosclerotic calcification. Proximal great vessels are unremarkable. Atherosclerotic calcification is seen in the right carotid artery at the base the neck.  There is rightward deviation of the esophagus by the enlarged left atrium. Esophagus otherwise unremarkable.  Negative for lymphadenopathy.  There is respiratory motion on the lung windows. There is compressive atelectasis of both lower lobes due to the overlying pleural effusions. No airspace consolidation, pulmonary edema, or interstitial abnormality is appreciated. No nodule or mass is seen, but sensitivity for detecting small nodules is decreased by patient respiratory motion.  The patient is kyphotic, and has chronic thoracic vertebral body compression fractures involving T5, T6, and T7. No new thoracic spine compression fractures compared to the CT of 10/12/2014.  Chronic right adrenal gland nodules are unchanged compared to abdominal CT of 2012. These are likely benign adenomas.  IMPRESSION: 1. Bilateral and symmetric simple pleural effusions, moderate-to-large in size. 2. Negative for empyema. 3. Significant compressive atelectasis of the lower lobes secondary to the overlying pleural effusions. 4. Respiratory motion limits detail of the lung parenchyma. No definite pulmonary edema. 5. Cardiomegaly and coronary artery atherosclerotic calcification. 6. Stable right adrenal gland nodularity dating back to 2012. These likely reflect benign adenomas. 7. Chronic T5, T6, and T7 compression fractures.   Electronically Signed   By: Curlene Dolphin M.D.   On: 11/12/2014 11:00   Dg Chest Port 1 View  11/12/2014   CLINICAL DATA:  Post LEFT thoracentesis  EXAM: PORTABLE CHEST - 1 VIEW  COMPARISON:  Portable exam 1522 hours compared to earlier study of 11/12/2014  FINDINGS: Rotated to the RIGHT.  Enlargement of cardiac silhouette.  Mediastinal contours and pulmonary  vascularity normal.  Atherosclerotic calcification aorta.  RIGHT pleural effusion and bibasilar atelectasis.  Decreased LEFT pleural effusion since previous exam.  No pneumothorax.  Osseous demineralization with advanced degenerative changes of the RIGHT glenohumeral joint.  IMPRESSION: No pneumothorax post LEFT thoracentesis.  LEFT basilar atelectasis with persistent effusion atelectasis at RIGHT base.   Electronically Signed   By: Elta Guadeloupe  Thornton Papas M.D.   On: 11/12/2014 15:28   Dg Chest Port 1 View  11/12/2014   CLINICAL DATA:  Systolic congestive heart failure.  EXAM: PORTABLE CHEST - 1 VIEW  COMPARISON:  11/11/2014 and 11/10/2014.  FINDINGS: 0456 hr. Patient remains rotated to the right. Cardiomegaly and aortic ectasia appear unchanged. There are stable left-greater-than-right pleural effusions with associated basilar airspace opacities. The pulmonary vasculature remain somewhat indistinct. There is no pneumothorax. Asymmetric glenohumeral degenerative changes are again noted on the right.  IMPRESSION: No significant change in pleural effusions, bibasilar airspace opacities and mild interstitial edema.   Electronically Signed   By: Richardean Sale M.D.   On: 11/12/2014 07:17   Dg Chest Port 1 View  11/11/2014   CLINICAL DATA:  Pleural effusion.  EXAM: PORTABLE CHEST - 1 VIEW  COMPARISON:  Single view of the chest 11/10/2014.  FINDINGS: Left worse than right pleural effusions and basilar airspace disease are again seen. No pneumothorax identified. Heart size appears enlarged. Mild appearing interstitial edema is again seen.  IMPRESSION: Left worse than right pleural effusions and basilar airspace disease.  Cardiomegaly and mild appearing interstitial edema.   Electronically Signed   By: Inge Rise M.D.   On: 11/11/2014 12:40   US Thoracentesis Asp Pleural Space W/img Guide  11/12/2014   INDICATION: Acute on chronic respiratory failure, pneumonia, acute renal failure, bilateral pleural effusions. Request  is made for therapeutic left thoracentesis .  EXAM: ULTRASOUND GUIDED THERAPEUTIC LEFT THORACENTESIS  COMPARISON:  Prior thoracentesis on 11/11/2014  MEDICATIONS: None  COMPLICATIONS: None immediate  TECHNIQUE: Informed written consent was obtained from the patient after a discussion of the risks, benefits and alternatives to treatment. A timeout was performed prior to the initiation of the procedure.  Initial ultrasound scanning demonstrates a moderate left pleural effusion. The lower chest was prepped and draped in the usual sterile fashion. 1% lidocaine was used for local anesthesia.  An ultrasound image was saved for documentation purposes. A 6 Fr Safe-T-Centesis catheter was introduced. The thoracentesis was performed. The catheter was removed and a dressing was applied. The patient tolerated the procedure well without immediate post procedural complication. The patient was escorted to have an upright chest radiograph.  FINDINGS: A total of approximately 650 cc's of slightly hazy, yellow fluid was removed.  IMPRESSION: Successful ultrasound-guided therapeutic left sided thoracentesis yielding 650 cc's of pleural fluid.  Read by: Rowe Robert, PA-C   Electronically Signed   By: Lucrezia Europe M.D.   On: 11/12/2014 15:27   US Thoracentesis Asp Pleural Space W/img Guide  11/11/2014   INDICATION: Symptomatic L sided pleural effusion  EXAM: PORTABLE US THORACENTESIS ASP PLEURAL SPACE W/IMG GUIDE  COMPARISON:  None.  MEDICATIONS: 10 cc 1% lidocaine  COMPLICATIONS: None immediate  TECHNIQUE: Informed written consent was obtained from the patient after a discussion of the risks, benefits and alternatives to treatment. A timeout was performed prior to the initiation of the procedure.  Initial ultrasound scanning demonstrates a left pleural effusion. The lower chest was prepped and draped in the usual sterile fashion. 1% lidocaine was used for local anesthesia.  Under direct ultrasound guidance, a 19 gauge, 7-cm, Yueh  catheter was introduced. An ultrasound image was saved for documentation purposes. The thoracentesis was performed. The catheter was removed and a dressing was applied. The patient tolerated the procedure well without immediate post procedural complication. The patient was escorted to have an upright chest radiograph.  FINDINGS: A total of approximately 150 cc of serous fluid  was removed. Requested samples were sent to the laboratory.  IMPRESSION: Successful ultrasound-guided L sided thoracentesis yielding 150 cc of pleural fluid.  Read by:  Lavonia Drafts [facility]   Electronically Signed   By: Jerilynn Mages.  Shick M.D.   On: 11/11/2014 11:41    Scheduled Meds: . antiseptic oral rinse  7 mL Mouth Rinse BID  . arformoterol  15 mcg Nebulization BID  . budesonide  0.25 mg Nebulization BID  . ceFEPime (MAXIPIME) IV  1 g Intravenous Q12H  . methylPREDNISolone (SOLU-MEDROL) injection  60 mg Intravenous Q8H  . rivaroxaban  20 mg Oral Daily  . vancomycin  1,500 mg Intravenous Q24H   Continuous Infusions: . sodium chloride 10 mL/hr at 11/12/14 2036  . furosemide (LASIX) infusion 8 mg/hr (11/13/14 0152)    Active Problems:   Atrial fibrillation   COPD exacerbation   Acute diastolic CHF (congestive heart failure)   Respiratory failure   Acute on chronic respiratory failure   Congestive heart disease   Pleural effusion   Ciena Sampley, [facility] Pager [phone]. If 7PM-7AM, please contact night-coverage at [email or web address], password [facility] 11/13/2014, 7:04 AM  LOS: 6 days

## 2014-11-13 NOTE — Progress Notes (Signed)
Initial Nutrition Assessment  DOCUMENTATION CODES:  Not applicable  INTERVENTION: Ensure Enlive po BID, each supplement provides 350 kcal and 20 grams of protein  Magic cup BID with meals, each supplement provides 290 kcal and 9 grams of protein  NUTRITION DIAGNOSIS:  Inadequate oral intake related to lethargy / confusion as evidenced by other (see comment) (poor po).  GOAL:  Patient will meet greater than or equal to 90% of their needs  MONITOR:  PO intake, Supplement acceptance, Labs, Weight trends  REASON FOR ASSESSMENT:  Consult Assessment of nutrition requirement/status  ASSESSMENT: 81 y/o female with h/o chronic diastolic HF, chronic atrial fibrillation on Xarelto, admitted for acute respiratory failure, in the setting of sepsis with HCAP, acute diastolic CHF, and bilateral pleural effusions.   - Pt with dementia. Nutritional history obtained from chart and RN.  - Per RN, pt ate ~50% of her breakfast this morning. She is swallowing food and liquids adequately. Liquid intake is fair.  - Of note, pt with chocolate allergy. Supplements must be flavor other than chocolate.  - Pt with weight loss since admission. Most likely due to diuresis. -3.7 L since admission per cardiology note.  - RD to order nutritional supplements. Current diet is soft diet/2 g Na.   Labs and medications reviewed  Na 150  K 3.3  Cl 95  BUN 33  Ca 8.6  Height:  Ht Readings from Last 1 Encounters:  11/07/14 5\' 7"  (1.702 m)    Weight:  Wt Readings from Last 1 Encounters:  11/13/14 178 lb 9.2 oz (81 kg)    Ideal Body Weight:  61.5 kg  Wt Readings from Last 10 Encounters:  11/13/14 178 lb 9.2 oz (81 kg)  10/12/14 184 lb 8.4 oz (83.7 kg)  09/20/14 188 lb (85.276 kg)  09/19/14 190 lb (86.183 kg)  09/17/14 185 lb (83.915 kg)  08/20/14 186 lb 3.2 oz (84.46 kg)  06/18/14 204 lb 3.2 oz (92.625 kg)  04/18/14 200 lb 6.4 oz (90.901 kg)  02/05/14 208 lb (94.348 kg)  01/29/14 208 lb 6.4  oz (94.53 kg)    BMI:  Body mass index is 27.96 kg/(m^2).  Estimated Nutritional Needs:  Kcal:  1900-2100  Protein:  100-110 g  Fluid:  2.0 L/day  Skin:  Reviewed, no issues  Diet Order:  DIET SOFT Room service appropriate?: Yes; Fluid consistency:: Thin  EDUCATION NEEDS:   No education needs identified at this time   Intake/Output Summary (Last 24 hours) at 11/13/14 1359 Last data filed at 11/13/14 1152  Gross per 24 hour  Intake 1207.83 ml  Output   1700 ml  Net -492.17 ml    Last BM:  5/29- loose  Laurette Schimke MS, [place], LDN [phone]

## 2014-11-13 NOTE — Progress Notes (Signed)
Patient Profile: 81 y/o female with h/o chronic diastolic HF, chronic atrial fibrillation on Xarelto, admitted for acute respiratory failure, in the setting of sepsis with HCAP, acute diastolic CHF, and bilateral pleural effusions.    Subjective: Dementia at baseline. Responds no to pain and SOB (on [place]).   Objective: Vital signs in last 24 hours: Temp:  [97.5 F (36.4 C)-98.5 F (36.9 C)] 98 F (36.7 C) (06/01 0300) Pulse Rate:  [73-159] 74 (06/01 0600) Resp:  [18-40] 24 (06/01 0600) BP: (102-161)/(45-91) 128/66 mmHg (06/01 0600) SpO2:  [90 %-98 %] 93 % (06/01 0600) Weight:  [178 lb 9.2 oz (81 kg)] 178 lb 9.2 oz (81 kg) (06/01 0449) Last BM Date: 11/11/14  Intake/Output from previous day: 05/31 0701 - 06/01 0700 In: 1638.5 [I.V.:388.5; IV Piggyback:600] Out: 1400 [Urine:1400] Intake/Output this shift:    Medications Current Facility-Administered Medications  Medication Dose Route Frequency Provider Last Rate Last Dose  . albuterol (PROVENTIL) (2.5 MG/3ML) 0.083% nebulizer solution 2.5 mg  2.5 mg Nebulization Q3H PRN Rahul P Desai, PA-C      . antiseptic oral rinse (CPC / CETYLPYRIDINIUM CHLORIDE 0.05%) solution 7 mL  7 mL Mouth Rinse BID Donne Hazel, MD   7 mL at 11/12/14 2200  . arformoterol (BROVANA) nebulizer solution 15 mcg  15 mcg Nebulization BID Rahul P Desai, PA-C   15 mcg at 11/12/14 2011  . budesonide (PULMICORT) nebulizer solution 0.25 mg  0.25 mg Nebulization BID Donne Hazel, MD   0.25 mg at 11/12/14 2011  . dextrose 5 % with KCl 20 mEq / L  infusion  20 mEq Intravenous Continuous Janece Canterbury, MD      . furosemide (LASIX) 250 mg in dextrose 5 % 250 mL (1 mg/mL) infusion  8 mg/hr Intravenous Continuous Donne Hazel, MD 8 mL/hr at 11/13/14 0152 8 mg/hr at 11/13/14 0152  . ipratropium-albuterol (DUONEB) 0.5-2.5 (3) MG/3ML nebulizer solution 3 mL  3 mL Nebulization Q4H PRN Donne Hazel, MD      . methylPREDNISolone sodium succinate (SOLU-MEDROL) 125  mg/2 mL injection 60 mg  60 mg Intravenous Q8H Donne Hazel, MD   60 mg at 11/13/14 0141  . metoprolol (LOPRESSOR) injection 2.5 mg  2.5 mg Intravenous Q4H PRN Florencia Reasons, MD   2.5 mg at 11/10/14 1120  . rivaroxaban (XARELTO) tablet 20 mg  20 mg Oral Daily Donita Brooks, NP   20 mg at 11/12/14 1157    PE: General appearance: alert, cooperative, no distress and pleasantly demented Neck: no carotid bruit and no JVD Lungs: decreased BS at the bases bilaterally Heart: irregularly irregular rhythm and regular rate Extremities: no LEE + SCDs Pulses: 2+ and symmetric Skin: warm and dry Neurologic: Grossly normal  Lab Results:   Recent Labs  11/11/14 0356 11/12/14 0345 11/13/14 0345  WBC 11.2* 8.7 8.7  HGB 10.7* 10.0* 11.6*  HCT 35.4* 34.7* 38.4  PLT 344 315 342   BMET  Recent Labs  11/11/14 0356 11/12/14 0345 11/13/14 0345  NA 143 143 150*  K 4.0 3.0* 3.3*  CL 93* 93* 95*  CO2 39* 40* 44*  GLUCOSE 153* 163* 165*  BUN 32* 32* 33*  CREATININE 0.51 0.57 0.58  CALCIUM 8.8* 8.3* 8.6*    Assessment/Plan  Active Problems:   Atrial fibrillation   COPD exacerbation   Acute diastolic CHF (congestive heart failure)   Respiratory failure   Acute on chronic respiratory failure   Congestive heart disease  Pleural effusion  1. Acute on Chronic Diastolic CHF: admit BNP 109.3. 2D echo 5/29 showed normal LVF with EF at 60-65%. F/u CXR yesterday showed no change in pleural effusions and mild interstitial edema. On Lasix infusion. Good diuretic response yesterday. UOP -1.4 L yesterday. I/Os net negative 3.7 L out since admit. Renal function is normal with SCr at 0.58 today. Continue diuresis with IV Lasix drip. Continue strict I/Os.  2. Chronic Atrial Fibrillation: rate is well controlled in the 70s. Continue Xarelto for a/c. Continue on telemetry.   3. Hypokalemia: K is 3.3 today. Supplement with K-dur. Monitor closely in the setting of IV diuretic therapy.     LOS: 6 days     Jennifer Nielsen 11/13/2014 7:41 AM  Patient seen and examined and history reviewed. Agree with above findings and plan. Patient unable to give much history. Appears to be responding to IV lasix. She did have successful pleural tap with 650 cc of fluid removed. She remains in afib with controlled rate. Frequent ventricular ectopy with short runs of NSVT. Need to replete potassium. Receiving D5W for hypernatremia. Continue diuresis.  Jennifer Nielsen, [place] 11/13/2014 1:00 PM

## 2014-11-14 ENCOUNTER — Inpatient Hospital Stay (HOSPITAL_COMMUNITY): Payer: Medicare Other

## 2014-11-14 DIAGNOSIS — J441 Chronic obstructive pulmonary disease with (acute) exacerbation: Secondary | ICD-10-CM

## 2014-11-14 LAB — IRON AND TIBC
IRON: 43 ug/dL (ref 28–170)
Saturation Ratios: 13 % (ref 10.4–31.8)
TIBC: 323 ug/dL (ref 250–450)
UIBC: 280 ug/dL

## 2014-11-14 LAB — BASIC METABOLIC PANEL
Anion gap: 10 (ref 5–15)
BUN: 29 mg/dL — ABNORMAL HIGH (ref 6–20)
CALCIUM: 8.2 mg/dL — AB (ref 8.9–10.3)
CO2: 49 mmol/L — ABNORMAL HIGH (ref 22–32)
CREATININE: 0.44 mg/dL (ref 0.44–1.00)
Chloride: 89 mmol/L — ABNORMAL LOW (ref 101–111)
GLUCOSE: 185 mg/dL — AB (ref 65–99)
Potassium: 3 mmol/L — ABNORMAL LOW (ref 3.5–5.1)
Sodium: 148 mmol/L — ABNORMAL HIGH (ref 135–145)

## 2014-11-14 LAB — TSH: TSH: 0.834 u[IU]/mL (ref 0.350–4.500)

## 2014-11-14 LAB — FERRITIN: Ferritin: 137 ng/mL (ref 11–307)

## 2014-11-14 LAB — FOLATE: FOLATE: 17.4 ng/mL (ref >5.9)

## 2014-11-14 LAB — VITAMIN B12: Vitamin B-12: 879 pg/mL (ref 180–914)

## 2014-11-14 MED ORDER — SODIUM CHLORIDE 0.9 % IJ SOLN
10.0000 mL | Freq: Two times a day (BID) | INTRAMUSCULAR | Status: DC
  Administered 2014-11-15 – 2014-11-19 (×4): 10 mL

## 2014-11-14 MED ORDER — SODIUM CHLORIDE 0.9 % IJ SOLN
10.0000 mL | INTRAMUSCULAR | Status: DC | PRN
  Administered 2014-11-15 – 2014-11-20 (×6): 10 mL
  Filled 2014-11-14 (×6): qty 40

## 2014-11-14 MED ORDER — METOLAZONE 2.5 MG PO TABS
2.5000 mg | ORAL_TABLET | Freq: Once | ORAL | Status: AC
  Administered 2014-11-14: 2.5 mg via ORAL
  Filled 2014-11-14: qty 1

## 2014-11-14 MED ORDER — PREDNISONE 50 MG PO TABS
60.0000 mg | ORAL_TABLET | Freq: Every day | ORAL | Status: DC
  Administered 2014-11-15 – 2014-11-16 (×2): 60 mg via ORAL
  Filled 2014-11-14: qty 3
  Filled 2014-11-14: qty 1

## 2014-11-14 MED ORDER — POTASSIUM CHLORIDE 10 MEQ/100ML IV SOLN
10.0000 meq | INTRAVENOUS | Status: AC
  Administered 2014-11-14 (×4): 10 meq via INTRAVENOUS
  Filled 2014-11-14 (×2): qty 100

## 2014-11-14 MED ORDER — METHYLPREDNISOLONE SODIUM SUCC 125 MG IJ SOLR: 60.0000 mg | Freq: Every day | INTRAMUSCULAR | Status: DC

## 2014-11-14 NOTE — Progress Notes (Signed)
Patient Profile: 81 y/o female with h/o chronic diastolic HF, chronic atrial fibrillation on Xarelto, admitted for acute respiratory failure, in the setting of sepsis with HCAP, acute diastolic CHF, and bilateral pleural effusions.    Subjective: More alert this morning. Dementia at baseline. Denies any pain or dyspnea.   Objective: Vital signs in last 24 hours: Temp:  [97.4 F (36.3 C)-98 F (36.7 C)] 97.8 F (36.6 C) (06/02 0400) Pulse Rate:  [61-91] 68 (06/02 0600) Resp:  [26-42] 27 (06/02 0600) BP: (108-165)/(44-122) 112/47 mmHg (06/02 0600) SpO2:  [94 %-98 %] 95 % (06/02 0600) Weight:  [184 lb 4.9 oz (83.6 kg)] 184 lb 4.9 oz (83.6 kg) (06/02 0500) Last BM Date: 11/11/14  Intake/Output from previous day: 06/01 0701 - 06/02 0700 In: 1301.8 [P.O.:120; I.V.:1181.8] Out: 1200 [Urine:1200] Intake/Output this shift:    Medications Current Facility-Administered Medications  Medication Dose Route Frequency Provider Last Rate Last Dose  . albuterol (PROVENTIL) (2.5 MG/3ML) 0.083% nebulizer solution 2.5 mg  2.5 mg Nebulization Q3H PRN Rahul P Desai, PA-C      . antiseptic oral rinse (CPC / CETYLPYRIDINIUM CHLORIDE 0.05%) solution 7 mL  7 mL Mouth Rinse BID Donne Hazel, MD   7 mL at 11/13/14 2200  . arformoterol (BROVANA) nebulizer solution 15 mcg  15 mcg Nebulization BID Rahul P Desai, PA-C   15 mcg at 11/13/14 2017  . budesonide (PULMICORT) nebulizer solution 0.25 mg  0.25 mg Nebulization BID Donne Hazel, MD   0.25 mg at 11/13/14 2017  . dextrose 5 % with KCl 20 mEq / L  infusion  20 mEq Intravenous Continuous Janece Canterbury, MD 50 mL/hr at 11/14/14 0630 20 mEq at 11/14/14 0630  . feeding supplement (ENSURE ENLIVE) (ENSURE ENLIVE) liquid 237 mL  237 mL Oral BID BM Dorann Ou, RD   237 mL at 11/13/14 1500  . furosemide (LASIX) 250 mg in dextrose 5 % 250 mL (1 mg/mL) infusion  8 mg/hr Intravenous Continuous Donne Hazel, MD 8 mL/hr at 11/13/14 0152 8 mg/hr at  11/13/14 0152  . ipratropium-albuterol (DUONEB) 0.5-2.5 (3) MG/3ML nebulizer solution 3 mL  3 mL Nebulization Q4H PRN Donne Hazel, MD      . methylPREDNISolone sodium succinate (SOLU-MEDROL) 125 mg/2 mL injection 60 mg  60 mg Intravenous Q8H Donne Hazel, MD   60 mg at 11/14/14 0130  . metoprolol (LOPRESSOR) injection 2.5 mg  2.5 mg Intravenous Q4H PRN Florencia Reasons, MD   2.5 mg at 11/10/14 1120  . potassium chloride 10 mEq in 100 mL IVPB  10 mEq Intravenous Q1 Hr x 4 Janece Canterbury, MD      . rivaroxaban Alveda Reasons) tablet 20 mg  20 mg Oral Daily Donita Brooks, NP   20 mg at 11/13/14 1856    PE: General appearance: alert, cooperative, no distress and pleasantly demented Neck: no carotid bruit and no JVD Lungs: decreased BS at the bases bilaterally Heart: irregularly irregular rhythm and regular rate Extremities: no LEE + SCDs Pulses: 2+ and symmetric Skin: warm and dry Neurologic: Grossly normal  Lab Results:   Recent Labs  11/12/14 0345 11/13/14 0345  WBC 8.7 8.7  HGB 10.0* 11.6*  HCT 34.7* 38.4  PLT 315 342   BMET  Recent Labs  11/12/14 0345 11/13/14 0345 11/14/14 0402  NA 143 150* 148*  K 3.0* 3.3* 3.0*  CL 93* 95* 89*  CO2 40* 44* 49*  GLUCOSE 163* 165* 185*  BUN  32* 33* 29*  CREATININE 0.57 0.58 0.44  CALCIUM 8.3* 8.6* 8.2*    Assessment/Plan  Principal Problem:   Acute diastolic CHF (congestive heart failure) Active Problems:   Atrial fibrillation   COPD exacerbation   Respiratory failure   Acute on chronic respiratory failure   Congestive heart disease   Pleural effusion   S/P thoracentesis  1. Acute on Chronic Diastolic CHF: admit BNP 038.3. 2D echo 5/29 showed normal LVF with EF at 60-65%.  On Lasix infusion. Good diuretic response. Additional 1.2 L out yesterday. I/Os net negative 3.4 L out since admit. Renal function is normal with SCr at 0.44 today. Continue diuresis with IV Lasix drip. Continue strict I/Os.  2. Chronic Atrial Fibrillation:  rate is well controlled in the 70s. Continue Xarelto for a/c. Continue on telemetry.   3. Hypokalemia: K is 3.0 today. Supplement with K-dur. Monitor closely in the setting of IV diuretic therapy.   4. Hypernatremia: Na improving. Down from 150-->148.       LOS: 7 days    Brittainy M. Ladoris Gene 11/14/2014 7:40 AM  Patient seen and examined and history reviewed. Agree with above findings and plan. Patient has diuresed some. Negative balance since admit but diuretic response is pretty slow. Heart rate well controlled. Replete potassium. Will give metolazone 2.5 mg x 1 today.  Peter Martinique, [place] 11/14/2014 12:53 PM

## 2014-11-14 NOTE — Progress Notes (Signed)
Peripherally Inserted Central Catheter/Midline Placement  The IV Nurse has discussed with the patient and/or persons authorized to consent for the patient, the purpose of this procedure and the potential benefits and risks involved with this procedure.  The benefits include less needle sticks, lab draws from the catheter and patient may be discharged home with the catheter.  Risks include, but not limited to, infection, bleeding, blood clot (thrombus formation), and puncture of an artery; nerve damage and irregular heat beat.  Alternatives to this procedure were also discussed.  PICC/Midline Placement Documentation        Jennifer Nielsen 11/14/2014, 4:43 PM Consent obtained by phone from daughter [name].

## 2014-11-14 NOTE — Progress Notes (Signed)
[facility] PROGRESS NOTE  Jennifer Nielsen UYQ:[id number] DOB: 1924/08/04 DOA: 11/07/2014 PCP: [name], MD  Off Service Summary:    81 yo demented female who presents with acute on chronic hypoxia secondary to chronic diastolic heart failure requiring BiPAP. Patient was found to have pulm edema with large bilateral pleural effusions as well as leukocytosis. The patient was started on empiric abx and started on diuretics. The patient has been resistant to low-mod dosed lasix and is now on a lasix gtt with better results. The patient underwent thoracentesis with over 700cc of fluid removed. The patient is clinically better but her progress has been slow. Cardiology is following.  Of note, the patient has multiple recent admissions for the same.  Assessment/Plan: 1. Acute on chronic respiratory failure due to acute on chronic diastolic heart failure and possible sepsis secondary to pneumonia.  Also has been treated for possible COPD exacerbation 1. D/c antibiotics 2. Recent EF of 50-55% on 3/16 echo. Given acuity of symptoms,with repeat EF of 60-65% 3. Pt now remains on 2LNC 4. Weight increased by 2kg and balance is about even since yesterday 5. continue lasix gtt 6. Metolazone added today 7. Appreciate cardiology assistance 8. D/c solumedrol and change to prednisone tomorrow 9. Continue brovana and pulmicort 2. Left pleural effusion,  1. Large effusions seen on CT chest 2. s/p US guided thoracentesis yielding 150cc of fluid initially, but repeat thoracentesis on 5/31 yielded 674mL yellow fluid 3. Appreciate radiology assistance 3. Sepsis with HCAP 1. Clinically septic on presentation, improved 2. D/c vancomycin and cefepime on 6/1 3. Remains afebrile 4. ARF, resolved 1. Renal function remains at or near baseline 2. Cont to monitor closely 5. Hyperkalemia/Hypokalemia 1. Was elevated on admit, resolved 2. Continue potassium in IV and oral supplementation prn 6. Chronic  Afib 1. Currently rate controlled 2. On PRN IV lopressor 3. Continue xarelto 7. Dementia, at baseline is oriented to person, place and time and can carry on coherent conversation.   1. Stable 2. Pt remains with pleasant affect 8. Hypernatremia likely due to poor free water intake from dementia while being diuresed, resolving with D5 infusion 1. Start D5W and continue diuresis per cardiology 9. Macrocytic anemia due to chronic disease 1. Iron studies, B12, folate, TSH all wnl 10. DVT prophylaxis, xarelto  Code Status: DNR Family Communication: Pt in room Disposition Plan: Pending improvement in dyspnea   Consultants: Pulmonary Radiology  Procedures:  Thoracentesis 5/30  Antibiotics:  Cefepime 5/27>>> 6/1  Vancomycin 5/27>>> 6/1  HPI/Subjective: Denies HA, nausea, chest pain, abdominal pain, or difficulty breathing.  Does not respond when asked if she has difficulty breathing.   Objective: Filed Vitals:   11/14/14 0753 11/14/14 0800 11/14/14 1000 11/14/14 1200  BP:  137/63 153/54 156/68  Pulse:  73 81 66  Temp: 98.9 F (37.2 C)     TempSrc: Axillary     Resp:  31 33 33  Height:      Weight:      SpO2: 93% 98% 94% 97%    Intake/Output Summary (Last 24 hours) at 11/14/14 1243 Last data filed at 11/14/14 0900  Gross per 24 hour  Intake   1218 ml  Output    900 ml  Net    318 ml   Filed Weights   11/12/14 0500 11/13/14 0449 11/14/14 0500  Weight: 86.9 kg (191 lb 9.3 oz) 81 kg (178 lb 9.2 oz) 83.6 kg (184 lb 4.9 oz)    Exam:   General:  Awake,alert, answering  questions, pleasant, [place] remains in place, in nad  Cardiovascular: IRRR, s1, s2  Respiratory: tachypnea, shallow, rapid breaths, no wheezing, rales, or rhonchi  Abdomen: soft, nondistended, NT  Musculoskeletal: perfused, no clubbing, 1+ pitting edema bilateral lower extremities  Data Reviewed: Basic Metabolic Panel:  Recent Labs Lab 11/10/14 0405 11/11/14 0356 11/12/14 0345 11/13/14 0345  11/14/14 0402  NA 139 143 143 150* 148*  K 4.5 4.0 3.0* 3.3* 3.0*  CL 91* 93* 93* 95* 89*  CO2 38* 39* 40* 44* 49*  GLUCOSE 182* 153* 163* 165* 185*  BUN 27* 32* 32* 33* 29*  CREATININE 0.61 0.51 0.57 0.58 0.44  CALCIUM 8.7* 8.8* 8.3* 8.6* 8.2*  MG  --   --   --  2.1  --    Liver Function Tests:  Recent Labs Lab 11/08/14 0349 11/12/14 0345  AST 29 27  ALT 22 25  ALKPHOS 65 59  BILITOT 0.8 0.4  PROT 6.2* 6.1*  ALBUMIN 2.6* 2.4*   No results for input(s): LIPASE, AMYLASE in the last 168 hours. No results for input(s): AMMONIA in the last 168 hours. CBC:  Recent Labs Lab 11/09/14 0349 11/10/14 0405 11/11/14 0356 11/12/14 0345 11/13/14 0345  WBC 10.4 8.5 11.2* 8.7 8.7  HGB 9.5* 10.7* 10.7* 10.0* 11.6*  HCT 32.2* 36.0 35.4* 34.7* 38.4  MCV 101.3* 102.0* 99.4 100.9* 102.1*  PLT 235 291 344 315 342   Cardiac Enzymes:  Recent Labs Lab 11/07/14 1713 11/07/14 2302  TROPONINI 0.08* 0.06*   BNP (last 3 results)  Recent Labs  10/09/14 1404 10/09/14 1753 11/07/14 0845  BNP 248.2* 309.6* 785.7*    ProBNP (last 3 results)  Recent Labs  02/22/14 1417 06/17/14 1050  PROBNP 2244.0* 170.0*    CBG: No results for input(s): GLUCAP in the last 168 hours.  Recent Results (from the past 240 hour(s))  Blood culture (routine x 2)     Status: None   Collection Time: 11/07/14 10:01 AM  Result Value Ref Range Status   Specimen Description BLOOD RIGHT HAND  Final   Special Requests BOTTLES DRAWN AEROBIC AND ANAEROBIC 5ML  Final   Culture   Final    NO GROWTH 5 DAYS Performed at [facility]    Report Status 11/13/2014 FINAL  Final  Blood culture (routine x 2)     Status: None   Collection Time: 11/07/14 10:01 AM  Result Value Ref Range Status   Specimen Description BLOOD RIGHT WRIST  Final   Special Requests BOTTLES DRAWN AEROBIC AND ANAEROBIC 5CC  Final   Culture   Final    NO GROWTH 5 DAYS Performed at [facility]    Report Status  11/13/2014 FINAL  Final  MRSA PCR Screening     Status: None   Collection Time: 11/07/14  1:41 PM  Result Value Ref Range Status   MRSA by PCR NEGATIVE NEGATIVE Final    Comment:        The GeneXpert MRSA Assay (FDA approved for NASAL specimens only), is one component of a comprehensive MRSA colonization surveillance program. It is not intended to diagnose MRSA infection nor to guide or monitor treatment for MRSA infections.   Body fluid culture     Status: None (Preliminary result)   Collection Time: 11/11/14 11:35 AM  Result Value Ref Range Status   Specimen Description PLEURAL LEFT  Final   Special Requests NONE  Final   Gram Stain   Final    FEW WBC  PRESENT,BOTH PMN AND MONONUCLEAR NO ORGANISMS SEEN Performed at [facility]    Culture   Final    NO GROWTH 2 DAYS Performed at [facility]    Report Status PENDING  Incomplete     Studies: Dg Chest Port 1 View  11/12/2014   CLINICAL DATA:  Post LEFT thoracentesis  EXAM: PORTABLE CHEST - 1 VIEW  COMPARISON:  Portable exam 1522 hours compared to earlier study of 11/12/2014  FINDINGS: Rotated to the RIGHT.  Enlargement of cardiac silhouette.  Mediastinal contours and pulmonary vascularity normal.  Atherosclerotic calcification aorta.  RIGHT pleural effusion and bibasilar atelectasis.  Decreased LEFT pleural effusion since previous exam.  No pneumothorax.  Osseous demineralization with advanced degenerative changes of the RIGHT glenohumeral joint.  IMPRESSION: No pneumothorax post LEFT thoracentesis.  LEFT basilar atelectasis with persistent effusion atelectasis at RIGHT base.   Electronically Signed   By: [name] M.D.   On: 11/12/2014 15:28   US Thoracentesis Asp Pleural Space W/img Guide  11/12/2014   INDICATION: Acute on chronic respiratory failure, pneumonia, acute renal failure, bilateral pleural effusions. Request is made for therapeutic left thoracentesis .  EXAM: ULTRASOUND GUIDED THERAPEUTIC LEFT  THORACENTESIS  COMPARISON:  Prior thoracentesis on 11/11/2014  MEDICATIONS: None  COMPLICATIONS: None immediate  TECHNIQUE: Informed written consent was obtained from the patient after a discussion of the risks, benefits and alternatives to treatment. A timeout was performed prior to the initiation of the procedure.  Initial ultrasound scanning demonstrates a moderate left pleural effusion. The lower chest was prepped and draped in the usual sterile fashion. 1% lidocaine was used for local anesthesia.  An ultrasound image was saved for documentation purposes. A 6 Fr Safe-T-Centesis catheter was introduced. The thoracentesis was performed. The catheter was removed and a dressing was applied. The patient tolerated the procedure well without immediate post procedural complication. The patient was escorted to have an upright chest radiograph.  FINDINGS: A total of approximately 650 cc's of slightly hazy, yellow fluid was removed.  IMPRESSION: Successful ultrasound-guided therapeutic left sided thoracentesis yielding 650 cc's of pleural fluid.  Read by: Rowe Robert, PA-C   Electronically Signed   By: Lucrezia Europe M.D.   On: 11/12/2014 15:27    Scheduled Meds: . antiseptic oral rinse  7 mL Mouth Rinse BID  . arformoterol  15 mcg Nebulization BID  . budesonide  0.25 mg Nebulization BID  . feeding supplement (ENSURE ENLIVE)  237 mL Oral BID BM  . methylPREDNISolone (SOLU-MEDROL) injection  60 mg Intravenous Q8H  . rivaroxaban  20 mg Oral Daily   Continuous Infusions: . dextrose 5 % with KCl 20 mEq / L Stopped (11/14/14 1221)  . furosemide (LASIX) infusion Stopped (11/14/14 1221)    Principal Problem:   Acute diastolic CHF (congestive heart failure) Active Problems:   Atrial fibrillation   COPD exacerbation   Respiratory failure   Acute on chronic respiratory failure   Congestive heart disease   Pleural effusion   S/P thoracentesis   Carolyn Maniscalco, [place] [facility] Pager [phone]. If  7PM-7AM, please contact night-coverage at [email or web address], password [facility] 11/14/2014, 12:43 PM  LOS: 7 days

## 2014-11-14 NOTE — Progress Notes (Signed)
Pt shows no signs of increase wob but is mildly tachypneic. Bipap not needed at this time. Pt remains on 2Lpm nasal cannula SpO2-95%. RT will continue to monitor as needed.

## 2014-11-14 NOTE — Progress Notes (Signed)
Dear Doctor: Sheran Fava This patient has been identified as a candidate for PICC for the following reason (s): IV therapy over 48 hours and poor veins/poor circulatory system (CHF, COPD, emphysema, diabetes, steroid use, IV drug abuse, etc.) If you agree, please write an order for the indicated device. For any questions contact the Vascular Access Team at [phone] if no answer, please leave a message.  Thank you for supporting the early vascular access assessment program.

## 2014-11-15 ENCOUNTER — Encounter: Payer: Self-pay | Admitting: Adult Health

## 2014-11-15 DIAGNOSIS — E870 Hyperosmolality and hypernatremia: Secondary | ICD-10-CM

## 2014-11-15 LAB — BODY FLUID CULTURE: Culture: NO GROWTH

## 2014-11-15 LAB — BASIC METABOLIC PANEL
BUN: 34 mg/dL — ABNORMAL HIGH (ref 6–20)
CO2: >50 mmol/L — ABNORMAL HIGH (ref 22–32)
Calcium: 8.3 mg/dL — ABNORMAL LOW (ref 8.9–10.3)
Chloride: 78 mmol/L — ABNORMAL LOW (ref 101–111)
Creatinine, Ser: 0.45 mg/dL (ref 0.44–1.00)
Glucose, Bld: 198 mg/dL — ABNORMAL HIGH (ref 65–99)
Potassium: 3.5 mmol/L (ref 3.5–5.1)
Sodium: 138 mmol/L (ref 135–145)

## 2014-11-15 MED ORDER — POTASSIUM CHLORIDE CRYS ER 20 MEQ PO TBCR
20.0000 meq | EXTENDED_RELEASE_TABLET | Freq: Once | ORAL | Status: AC
  Administered 2014-11-15: 20 meq via ORAL
  Filled 2014-11-15: qty 1

## 2014-11-15 MED ORDER — METOLAZONE 2.5 MG PO TABS
2.5000 mg | ORAL_TABLET | Freq: Every day | ORAL | Status: AC
  Administered 2014-11-15 – 2014-11-17 (×3): 2.5 mg via ORAL
  Filled 2014-11-15 (×3): qty 1

## 2014-11-15 NOTE — Progress Notes (Signed)
Patient ID: Jennifer Nielsen, female   DOB: 12/13/1924, 81 y.o.   MRN: [id number]   10/14/14  Facility:  Nursing Home Location:  [place] Room Number: 4696-2 LEVEL OF CARE:  SNF (31)    Chief Complaint  Patient presents with  . Hospitalization Follow-up    Physical deconditioning, acute respiratory failure with hypoxia, chronic atrial fibrillation, COPD, chronic diastolic CHF, pneumonia, anemia and hypokalemia    HISTORY OF PRESENT ILLNESS:   This is an 81 year old female who has been admitted to [facility] on 10/12/14 from [facility]. She has PMH of COPD, dementia, atrial fibrillation on anticoagulation with Xarelto and chronic diastolic CHF. She is a long-term care resident at [facility] but has to be transferred to the hospital due to worsening SOB. WBC noted to be 12.5. Acute respiratory failure seems to be multi-factorial and secondary to acute on chronic diastolic CHF, left pleural effusion, diabetes is at pneumonia and acute COPD. She was started on azithromycin and Rocephin on admission. She had thoracentesis on 4/27 with 700 mL of fluid removed. Repeat chest x-ray on 4/28 indicative NEAR resolution of left sided pleural effusion.  She has been re-admitted for a short-term rehabilitation then long-term care.  PAST MEDICAL HISTORY:  Past Medical History  Diagnosis Date  . Arrhythmia   . Arthritis   . Atrial fibrillation     Remains stable (As of 05/09/13)  . H/O blood clots   . Hyperlipemia   . GERD (gastroesophageal reflux disease)     Stable (As of 05/09/13)  . Osteoarthritis     Denies pain (As of 05/09/13)  . CHF (congestive heart failure)     chronic diastolic  . COPD (chronic obstructive pulmonary disease)     w/exacerbation  . Dementia     Remains stable & continues to function adequately in the current living environment with supervision. Has had little changes in behavior (AS OF 05/09/13)    CURRENT MEDICATIONS:  Reviewed per MAR/see medication list  Allergies  Allergen Reactions  . Chocolate Itching  . Ciprofloxacin Itching  . Coffee Bean Extract [Coffea Arabica] Itching  . Coffee Flavor Itching  . Eggs Or Egg-Derived Products   . Gentamycin [Gentamicin] Itching  . Imodium [Loperamide] Other (See Comments)    unknown  . Penicillins Itching  . Sulfa Antibiotics Itching  . Tea Itching     REVIEW OF SYSTEMS:  GENERAL: no change in appetite, no fatigue, no weight changes, no fever, chills or weakness RESPIRATORY: no  hemoptysis CARDIAC: no chest pain, edema or palpitations GI: no abdominal pain, diarrhea, constipation, heart burn, nausea or vomiting  PHYSICAL EXAMINATION  GENERAL: no acute distress, obese EYES: conjunctivae normal, sclerae normal, normal eye lids NECK: supple, trachea midline, no neck masses, no thyroid tenderness, no thyromegaly LYMPHATICS: no LAN in the neck, no supraclavicular LAN RESPIRATORY: breathing is even & unlabored, BS CTAB CARDIAC: RRR, no murmur,no extra heart sounds, no edema GI: abdomen soft, normal BS, no masses, no tenderness, no hepatomegaly, no splenomegaly EXTREMITIES:  Able to move X 4 extremities but has generalized weakness of BLE PSYCHIATRIC: the patient is alert & oriented to person, affect & behavior appropriate  LABS/RADIOLOGY: Labs reviewed: Basic Metabolic Panel:   Recent Labs   08/18/14 0415  10/09/14 1753      NA 137  < > 139      K 3.2*  < > 4.4      CL 79*  < > 96  CO2 46*  < > 34*      GLUCOSE 142*  < > 205*      BUN 31*  < > 21      CREATININE 0.58  < > 0.76      CALCIUM 8.2*  < > 8.6      MG 2.1  --  2.2      < > = values in this interval not displayed.   Recent Labs  08/11/14 2251 08/19/14 0510  AMMONIA 22 27   CBC:  Recent Labs   10/09/14 1405 10/09/14 1753        WBC 12.5* 11.4*        NEUTROABS 10.2* 10.7*        HGB 11.3* 11.1*        HCT 37.3 36.6        MCV 98.4 99.7        PLT 253 266          < > = values in this interval not displayed.    CBG:  Recent Labs  10/10/14 2333   GLUCAP 190*      ASSESSMENT/PLAN:  Physical deconditioning - for rehabilitation Acute respiratory failure with hypoxia - continue O2 at 2 L/m via [place] continuously, azithromycin 5 more days and encourage incentive spirometry when a week Chronic atrial fibrillation - rate controlled; continue Xarelto 20 mg by mouth daily, diltiazem 120 mg 1 tab by mouth daily and digoxin 0.125 mg by mouth every other day and 0.25 mg by mouth every other day  COPD - continue Symbicort 160-4.5 g/ACT 2 puffs into lungs twice a day and DuoNeb 1 neb every 4 hours when necessary Chronic diastolic CHF - continue Lasix 40 mg by mouth twice a day Hypokalemia  - K4.4; continue Klor-Con 20 meq by mouth daily  HCAP - continue Zithromax 250 mg by mouth daily 5 more days and Mucinex 600 mg by mouth twice a day Anemia of chronic disease - hemoglobin 9.5; will monitor   Goals of care:  Short-term rehabilitation   Spent 50 minutes in patient care.    [facility] [place], NP [employer] [phone]

## 2014-11-15 NOTE — Progress Notes (Signed)
Patient Profile: 81 y/o female with h/o chronic diastolic HF, chronic atrial fibrillation on Xarelto, admitted for acute respiratory failure, in the setting of sepsis with HCAP, acute diastolic CHF, and bilateral pleural effusions. S/p lt thoracentesis 650 cc   Subjective:   Objective: Vital signs in last 24 hours: Temp:  [97.6 F (36.4 C)-98.7 F (37.1 C)] 97.6 F (36.4 C) (06/03 0400) Pulse Rate:  [66-94] 68 (06/03 0400) Resp:  [23-33] 26 (06/03 0400) BP: (112-156)/(46-84) 133/56 mmHg (06/03 0400) SpO2:  [94 %-98 %] 95 % (06/03 0745) Weight:  [184 lb 4.9 oz (83.6 kg)] 184 lb 4.9 oz (83.6 kg) (06/03 0459) Weight change: 0 lb (0 kg) Last BM Date: 11/11/14 Intake/Output from previous day: -507  (-4162 since admit) wt 184.4 stable for 2 days.   06/02 0701 - 06/03 0700 In: 1592.8 [P.O.:600; I.V.:992.8] Out: 2100 [Urine:2100] Intake/Output this shift:    PE: General:Pleasant affect, NAD Skin:Warm and dry, brisk capillary refill HEENT:normocephalic, sclera clear, mucus membranes moist Neck:supple, + JVD Heart:S1S2 RRR without murmur, gallup, rub or click Lungs:clear without rales, rhonchi, or wheezes BTD:VVOH, non tender, + BS, do not palpate liver spleen or masses Ext:tr lower ext edema, 2+ pedal pulses, 2+ radial pulses Neuro:alert and oriented X 3, MAE, follows commands, + facial symmetry Tele: a fib rate controlled, occ PVCs and couplets   Lab Results:  Recent Labs  11/13/14 0345  WBC 8.7  HGB 11.6*  HCT 38.4  PLT 342   BMET  Recent Labs  11/14/14 0402 11/15/14 0342  NA 148* 138  K 3.0* 3.5  CL 89* 78*  CO2 49* >50*  GLUCOSE 185* 198*  BUN 29* 34*  CREATININE 0.44 0.45  CALCIUM 8.2* 8.3*   No results for input(s): TROPONINI in the last 72 hours.  Invalid input(s): CK, MB  No results found for: CHOL, HDL, LDLCALC, LDLDIRECT, TRIG, CHOLHDL No results found for: HGBA1C   Lab Results  Component Value Date   TSH 0.834 11/14/2014     BNP  (last 3 results)  Recent Labs  10/09/14 1404 10/09/14 1753 11/07/14 0845  BNP 248.2* 309.6* 785.7*       Studies/Results: Dg Chest Port 1 View  11/14/2014   CLINICAL DATA:  PICC line placement.  EXAM: PORTABLE CHEST - 1 VIEW  COMPARISON:  11/12/2014 and 11/11/2014.  FINDINGS: 1648 hours. Patient is rotated to the right. There is a new right arm PICC projected inferiorly over the SVC right atrial junction. No pneumothorax. Cardiomegaly, bilateral pleural effusions and bibasilar pulmonary opacities are unchanged. There are glenohumeral degenerative changes bilaterally.  IMPRESSION: Right arm PICC placement as described without evidence of pneumothorax. No significant change in bilateral pleural effusions and bibasilar pulmonary opacities.   Electronically Signed   By: Richardean Sale M.D.   On: 11/14/2014 17:12    Medications: I have reviewed the patient's current medications. Scheduled Meds: . antiseptic oral rinse  7 mL Mouth Rinse BID  . arformoterol  15 mcg Nebulization BID  . budesonide  0.25 mg Nebulization BID  . feeding supplement (ENSURE ENLIVE)  237 mL Oral BID BM  . predniSONE  60 mg Oral Q breakfast  . rivaroxaban  20 mg Oral Daily  . sodium chloride  10-40 mL Intracatheter Q12H   Continuous Infusions: . furosemide (LASIX) infusion 8 mg/hr (11/14/14 1714)   PRN Meds:.albuterol, ipratropium-albuterol, metoprolol, sodium chloride  Assessment/Plan: Principal Problem:   Acute diastolic CHF (congestive heart failure) Active Problems:  Atrial fibrillation   COPD exacerbation   Respiratory failure   Acute on chronic respiratory failure   Congestive heart disease   Pleural effusion   S/P thoracentesis  1. Acute on Chronic Diastolic CHF: admit BNP 505.6. 2D echo 5/29 showed normal LVF with EF at 60-65%. On Lasix infusion. Good diuretic response.  I/Os net negative 4.1 L out since admit. Renal function is normal with SCr at 0.45 today. Continue diuresis with IV Lasix  drip- though may be time to decrease, Dr. Martinique to see.   Continue strict I/Os.  2. Chronic Atrial Fibrillation: rate is well controlled in the 70s. Continue Xarelto for a/c. Continue on telemetry.   3. Hypokalemia: K is 3.5 today improved. Supplement with K-dur. Monitor closely in the setting of IV diuretic therapy.   4. Hypernatremia: Na improving. Down from 150-->148.--> 138.     LOS: 8 days   Time spent with pt. : 15 minutes. [facility]  Nurse Practitioner Certified Pager [phone] or after 5pm and on weekends call [phone] 11/15/2014, 7:59 AM   Patient seen and examined and history reviewed. Agree with above findings and plan. She did have better diuresis yesterday with metolazone. Renal function stable. electrolytes stable. Afib rate well controlled. CXR yesterday unchanged with persistent pleural effusions. I would continue IV lasix for now. Will give additional metolazone for next 3 days. Continue to monitor BMET daily.   Peter Martinique, [place] 11/15/2014 9:17 AM

## 2014-11-15 NOTE — Progress Notes (Signed)
[facility] PROGRESS NOTE  Jennifer Nielsen DIY:[id number] DOB: 07/14/1924 DOA: 11/07/2014 PCP: [name], MD  Off Service Summary:    82 yo demented female who presents with acute on chronic hypoxia secondary to chronic diastolic heart failure requiring BiPAP. Patient was found to have pulm edema with large bilateral pleural effusions as well as leukocytosis. The patient was started on empiric abx and started on diuretics. The patient has been resistant to low-mod dosed lasix and is now on a lasix gtt with better results. The patient underwent thoracentesis with over 700cc of fluid removed. The patient is clinically better but her progress has been slow. Cardiology is following.  Of note, the patient has multiple recent admissions for the same.  Assessment/Plan: 1. Acute on chronic respiratory failure due to acute on chronic diastolic heart failure and possible sepsis secondary to pneumonia.  Also has been treated for possible COPD exacerbation 1. EF of 60-65%, unable to assess diastolic dysfunction,  PA peak pressure 94mmHg 2. Pt now remains on 2LNC 3. Weight even, -537mL yesterday 4. continue lasix gtt 5. Metolazone added 6/2 6. Appreciate cardiology assistance 7. Start prednisone taper 8. Continue brovana and pulmicort 2. Left pleural effusion, large effusions seen on CT chest 1. s/p US guided thoracentesis yielding 150cc of fluid initially, but repeat thoracentesis on 5/31 yielded 672mL yellow fluid 2. Appreciate radiology assistance 3. Pleural fluid culture no growth final 3. Sepsis with HCAP 1. Clinically septic on presentation, improved 2. D/c vancomycin and cefepime on 6/1 3. Remains afebrile 4. ARF, resolved 1. Renal function remains at or near baseline 2. Cont to monitor closely 5. Hyperkalemia/Hypokalemia 1. Was elevated on admit, resolved 2. Continue potassium in IV and oral supplementation prn 6. Chronic Afib 1. Currently rate controlled 2. On PRN IV  lopressor 3. Continue xarelto 7. Dementia, at baseline is oriented to person, place and time and can carry on coherent conversation.   1. Stable 2. Pt remains with pleasant affect 8. Hypernatremia likely due to poor free water intake from dementia while being diuresed, resolving with D5 infusion.  Resolved with dextrose infusion 1. D/c D5W  9. Macrocytic anemia due to chronic disease 1. Iron studies, B12, folate, TSH all wnl 10. DVT prophylaxis, xarelto  Code Status: DNR Family Communication: Pt in room Disposition Plan: Pending improvement in dyspnea, transfer to floor.  PT/OT evaluations   Consultants: Pulmonary Radiology  Procedures:  Thoracentesis 5/30  Antibiotics:  Cefepime 5/27>>> 6/1  Vancomycin 5/27>>> 6/1  HPI/Subjective: Feeling well today.  Denies chest pain, shortness of breath, nausea.   Objective: Filed Vitals:   11/15/14 0752 11/15/14 0800 11/15/14 1000 11/15/14 1220  BP:  121/51 125/70   Pulse: 82 85 84   Temp:    98.4 F (36.9 C)  TempSrc:    Oral  Resp: 26 30 27    Height:      Weight:      SpO2: 98% 97% 95%     Intake/Output Summary (Last 24 hours) at 11/15/14 1438 Last data filed at 11/15/14 1237  Gross per 24 hour  Intake 1290.76 ml  Output   3100 ml  Net -1809.24 ml   Filed Weights   11/13/14 0449 11/14/14 0500 11/15/14 0459  Weight: 81 kg (178 lb 9.2 oz) 83.6 kg (184 lb 4.9 oz) 83.6 kg (184 lb 4.9 oz)    Exam:   General:  Awake,alert, answering questions, pleasant, [place] remains in place, in nad  Cardiovascular: IRRR, s1, s2  Respiratory: tachypnea, shallow, rapid breaths, diminished  at bilateral bases, no wheezing, rales, or rhonchi  Abdomen: soft, nondistended, NT  Musculoskeletal: perfused, no clubbing, 1+ pitting edema bilateral lower extremities  Data Reviewed: Basic Metabolic Panel:  Recent Labs Lab 11/11/14 0356 11/12/14 0345 11/13/14 0345 11/14/14 0402 11/15/14 0342  NA 143 143 150* 148* 138  K 4.0 3.0* 3.3*  3.0* 3.5  CL 93* 93* 95* 89* 78*  CO2 39* 40* 44* 49* >50*  GLUCOSE 153* 163* 165* 185* 198*  BUN 32* 32* 33* 29* 34*  CREATININE 0.51 0.57 0.58 0.44 0.45  CALCIUM 8.8* 8.3* 8.6* 8.2* 8.3*  MG  --   --  2.1  --   --    Liver Function Tests:  Recent Labs Lab 11/12/14 0345  AST 27  ALT 25  ALKPHOS 59  BILITOT 0.4  PROT 6.1*  ALBUMIN 2.4*   No results for input(s): LIPASE, AMYLASE in the last 168 hours. No results for input(s): AMMONIA in the last 168 hours. CBC:  Recent Labs Lab 11/09/14 0349 11/10/14 0405 11/11/14 0356 11/12/14 0345 11/13/14 0345  WBC 10.4 8.5 11.2* 8.7 8.7  HGB 9.5* 10.7* 10.7* 10.0* 11.6*  HCT 32.2* 36.0 35.4* 34.7* 38.4  MCV 101.3* 102.0* 99.4 100.9* 102.1*  PLT 235 291 344 315 342   Cardiac Enzymes: No results for input(s): CKTOTAL, CKMB, CKMBINDEX, TROPONINI in the last 168 hours. BNP (last 3 results)  Recent Labs  10/09/14 1404 10/09/14 1753 11/07/14 0845  BNP 248.2* 309.6* 785.7*    ProBNP (last 3 results)  Recent Labs  02/22/14 1417 06/17/14 1050  PROBNP 2244.0* 170.0*    CBG: No results for input(s): GLUCAP in the last 168 hours.  Recent Results (from the past 240 hour(s))  Blood culture (routine x 2)     Status: None   Collection Time: 11/07/14 10:01 AM  Result Value Ref Range Status   Specimen Description BLOOD RIGHT HAND  Final   Special Requests BOTTLES DRAWN AEROBIC AND ANAEROBIC 5ML  Final   Culture   Final    NO GROWTH 5 DAYS Performed at [facility]    Report Status 11/13/2014 FINAL  Final  Blood culture (routine x 2)     Status: None   Collection Time: 11/07/14 10:01 AM  Result Value Ref Range Status   Specimen Description BLOOD RIGHT WRIST  Final   Special Requests BOTTLES DRAWN AEROBIC AND ANAEROBIC 5CC  Final   Culture   Final    NO GROWTH 5 DAYS Performed at [facility]    Report Status 11/13/2014 FINAL  Final  MRSA PCR Screening     Status: None   Collection Time: 11/07/14   1:41 PM  Result Value Ref Range Status   MRSA by PCR NEGATIVE NEGATIVE Final    Comment:        The GeneXpert MRSA Assay (FDA approved for NASAL specimens only), is one component of a comprehensive MRSA colonization surveillance program. It is not intended to diagnose MRSA infection nor to guide or monitor treatment for MRSA infections.   Body fluid culture     Status: None   Collection Time: 11/11/14 11:35 AM  Result Value Ref Range Status   Specimen Description PLEURAL LEFT  Final   Special Requests NONE  Final   Gram Stain   Final    FEW WBC PRESENT,BOTH PMN AND MONONUCLEAR NO ORGANISMS SEEN Performed at [facility]    Culture   Final    NO GROWTH 3 DAYS Performed  at [facility]    Report Status 11/15/2014 FINAL  Final     Studies: Dg Chest Port 1 View  11/14/2014   CLINICAL DATA:  PICC line placement.  EXAM: PORTABLE CHEST - 1 VIEW  COMPARISON:  11/12/2014 and 11/11/2014.  FINDINGS: 1648 hours. Patient is rotated to the right. There is a new right arm PICC projected inferiorly over the SVC right atrial junction. No pneumothorax. Cardiomegaly, bilateral pleural effusions and bibasilar pulmonary opacities are unchanged. There are glenohumeral degenerative changes bilaterally.  IMPRESSION: Right arm PICC placement as described without evidence of pneumothorax. No significant change in bilateral pleural effusions and bibasilar pulmonary opacities.   Electronically Signed   By: Richardean Sale M.D.   On: 11/14/2014 17:12    Scheduled Meds: . antiseptic oral rinse  7 mL Mouth Rinse BID  . arformoterol  15 mcg Nebulization BID  . budesonide  0.25 mg Nebulization BID  . feeding supplement (ENSURE ENLIVE)  237 mL Oral BID BM  . metolazone  2.5 mg Oral Daily  . predniSONE  60 mg Oral Q breakfast  . rivaroxaban  20 mg Oral Daily  . sodium chloride  10-40 mL Intracatheter Q12H   Continuous Infusions: . furosemide (LASIX) infusion 8 mg/hr (11/14/14 1714)     Principal Problem:   Acute diastolic CHF (congestive heart failure) Active Problems:   Atrial fibrillation   COPD exacerbation   Respiratory failure   Acute on chronic respiratory failure   Congestive heart disease   Pleural effusion   S/P thoracentesis   Earma Nicolaou, [place] [facility] Pager [phone]. If 7PM-7AM, please contact night-coverage at [email or web address], password [facility] 11/15/2014, 2:38 PM  LOS: 8 days

## 2014-11-15 NOTE — Evaluation (Signed)
Physical Therapy Evaluation Patient Details Name: Jennifer Nielsen MRN: [id number] DOB: 1924-09-21 Today's Date: 11/15/2014   History of Present Illness  81 y/o female with h/o chronic diastolic HF, chronic atrial fibrillation on Xarelto, admitted for acute respiratory failure, in the setting of sepsis with HCAP, acute diastolic CHF, and bilateral pleural effusions.  S/p thoracentesis 5/31 w/700cc fluid removed.   Clinical Impression  Patient presents with decreased independence with mobility due to deficits listed in PT problem list.  She will benefit from skilled PT in the acute setting to decrease burden of care and allow return to SNF level rehab at d/c.    Follow Up Recommendations SNF    Equipment Recommendations  None recommended by PT    Recommendations for Other Services       Precautions / Restrictions Precautions Precautions: Fall Restrictions Weight Bearing Restrictions: No      Mobility  Bed Mobility Overal bed mobility: Needs Assistance Bed Mobility: Supine to Sit;Sit to Supine     Supine to sit: HOB elevated;Max assist;+2 for physical assistance Sit to supine: Total assist;+2 for physical assistance   General bed mobility comments: assisted some with supine to sit pulling up on PT's hands. then to supine assist for legs and trunk and scooted on pads  Transfers Overall transfer level: Needs assistance   Transfers: Sit to/from Stand Sit to Stand: Total assist;+2 physical assistance;From elevated surface         General transfer comment: assisted to attempt to stand x 2 with chair back in front of pt.  Unable to get fully upright despite assist under her hips.  Stays flexed in sitting as well  Ambulation/Gait                Stairs            Wheelchair Mobility    Modified Rankin (Stroke Patients Only)       Balance Overall balance assessment: Needs assistance Sitting-balance support: Bilateral upper extremity supported Sitting  balance-Leahy Scale: Poor Sitting balance - Comments: falls to right and back; can sit briefly with supervision holding onto bedrail     Standing balance-Leahy Scale: Zero                               Pertinent Vitals/Pain Pain Assessment: Faces Faces Pain Scale: Hurts little more Pain Location: l knee with movement Pain Intervention(s): Monitored during session;Repositioned    Home Living Family/patient expects to be discharged to:: Skilled nursing facility                 Additional Comments: from [facility] place    Prior Function Level of Independence: Needs assistance   Gait / Transfers Assistance Needed: nonambulatory     Comments: patient states  2 person assist OOB, [place]     Hand Dominance   Dominant Hand: Right    Extremity/Trunk Assessment   Upper Extremity Assessment: RUE deficits/detail;LUE deficits/detail RUE Deficits / Details: AAROM shoulder flexion about 60 degrees (unable to lift without assist,) elbow flex 4-/5, ext 3+/5     LUE Deficits / Details: AROM shoulder flexion about 80 degrees, strength at least 3+/5, elbow flex 4/5, ext 3+/5   Lower Extremity Assessment: RLE deficits/detail;LLE deficits/detail RLE Deficits / Details: AAROM limited knee flexion about 60 with HOB elevated in supine; strength knee extension 3/5 ankle DF limited about 40 degrees AAROM LLE Deficits / Details: AAROM limited knee flexion about 50 with  pain with HOB elevated in supine; strength knee extension 2+/5 ankle DF limited about 60 degrees AAROM  Cervical / Trunk Assessment: Kyphotic;Other exceptions  Communication   Communication: No difficulties  Cognition Arousal/Alertness: Awake/alert Behavior During Therapy: WFL for tasks assessed/performed Overall Cognitive Status: History of cognitive impairments - at baseline                      General Comments General comments (skin integrity, edema, etc.): daughter present to give history     Exercises        Assessment/Plan    PT Assessment Patient needs continued PT services  PT Diagnosis Generalized weakness   PT Problem List Decreased strength;Decreased mobility;Decreased balance;Decreased activity tolerance;Decreased range of motion  PT Treatment Interventions DME instruction;Therapeutic exercise;Functional mobility training;Therapeutic activities;Patient/family education;Balance training   PT Goals (Current goals can be found in the Care Plan section) Acute Rehab PT Goals Patient Stated Goal: To get therapy back at [facility] PT Goal Formulation: With patient/family Time For Goal Achievement: 11/29/14 Potential to Achieve Goals: Good    Frequency Min 2X/week   Barriers to discharge        Co-evaluation               End of Session Equipment Utilized During Treatment: Gait belt Activity Tolerance: Patient limited by fatigue Patient left: in bed;with call bell/phone within reach;with family/visitor present           Time: 1450-1520 PT Time Calculation (min) (ACUTE ONLY): 30 min   Charges:   PT Evaluation $Initial PT Evaluation Tier I: 1 Procedure PT Treatments $Therapeutic Activity: 8-22 mins   PT G Codes:        WYNN,CYNDI 2014/12/09, 4:19 PM  Magda Kiel, [place] 12/09/14

## 2014-11-15 NOTE — Progress Notes (Signed)
Transferred to 1402 via bed with all belongings. Family at bedside and updated. Report given and all questions answered.

## 2014-11-15 NOTE — Progress Notes (Signed)
CSW continues to follow to assist with d/c planning. Pt is planning to return to [facility] when stable for d/c. CSW has sent daily updates to SNF since admission.  Werner Lean LGSW [phone]

## 2014-11-15 NOTE — Progress Notes (Signed)
Date:  November 15, 2014 U.R. performed for needs and level of care. Will continue to follow for Case Management needs.  Velva Harman, RN, BSN, [place]   [phone]

## 2014-11-16 DIAGNOSIS — E870 Hyperosmolality and hypernatremia: Secondary | ICD-10-CM

## 2014-11-16 LAB — BASIC METABOLIC PANEL
BUN: 30 mg/dL — ABNORMAL HIGH (ref 6–20)
CALCIUM: 8.5 mg/dL — AB (ref 8.9–10.3)
CHLORIDE: 70 mmol/L — AB (ref 101–111)
Creatinine, Ser: 0.48 mg/dL (ref 0.44–1.00)
GFR calc Af Amer: >60 mL/min (ref >60)
GFR calc non Af Amer: >60 mL/min (ref >60)
Glucose, Bld: 100 mg/dL — ABNORMAL HIGH (ref 65–99)
POTASSIUM: 3.2 mmol/L — AB (ref 3.5–5.1)
Sodium: 137 mmol/L (ref 135–145)

## 2014-11-16 LAB — BRAIN NATRIURETIC PEPTIDE: B Natriuretic Peptide: 225.8 pg/mL — ABNORMAL HIGH (ref 0.0–100.0)

## 2014-11-16 LAB — MAGNESIUM: Magnesium: 2 mg/dL (ref 1.7–2.4)

## 2014-11-16 MED ORDER — PREDNISONE 20 MG PO TABS
40.0000 mg | ORAL_TABLET | Freq: Every day | ORAL | Status: DC
  Administered 2014-11-17 – 2014-11-19 (×2): 40 mg via ORAL
  Filled 2014-11-16 (×3): qty 2

## 2014-11-16 MED ORDER — POTASSIUM CHLORIDE CRYS ER 20 MEQ PO TBCR
40.0000 meq | EXTENDED_RELEASE_TABLET | Freq: Once | ORAL | Status: AC
  Administered 2014-11-16: 40 meq via ORAL
  Filled 2014-11-16: qty 2

## 2014-11-16 NOTE — Progress Notes (Signed)
   SUBJECTIVE: The patient is doing well today.  No very verbal.  No complaints.   Marland Kitchen antiseptic oral rinse  7 mL Mouth Rinse BID  . arformoterol  15 mcg Nebulization BID  . budesonide  0.25 mg Nebulization BID  . feeding supplement (ENSURE ENLIVE)  237 mL Oral BID BM  . metolazone  2.5 mg Oral Daily  . potassium chloride  40 mEq Oral Once  . predniSONE  60 mg Oral Q breakfast  . rivaroxaban  20 mg Oral Daily  . sodium chloride  10-40 mL Intracatheter Q12H   . furosemide (LASIX) infusion 8 mg/hr (11/16/14 0433)    OBJECTIVE: Physical Exam: Filed Vitals:   11/15/14 1943 11/15/14 1947 11/15/14 2101 11/16/14 0506  BP:   142/72 126/68  Pulse:   106 100  Temp:   98.1 F (36.7 C) 98 F (36.7 C)  TempSrc:   Oral Oral  Resp:   21 20  Height:      Weight:    82.827 kg (182 lb 9.6 oz)  SpO2: 98% 98% 93% 98%    Intake/Output Summary (Last 24 hours) at 11/16/14 1607 Last data filed at 11/15/14 1900  Gross per 24 hour  Intake    328 ml  Output   1425 ml  Net  -1097 ml    Telemetry reveals afib, NSVT  GEN- The patient is elderly appearing, elderly, nods head but not verbal with me today Head- normocephalic, atraumatic Eyes-  Sclera clear, conjunctiva pink Ears- hearing intact Oropharynx- clear Neck- supple, + JVD Lungs- decreased BS at bases  normal work of breathing Heart- irregular rate and rhythm  GI- soft, NT, ND, + BS Extremities- no clubbing, cyanosis,+ dependant edema Skin- no rash or lesion Psych- euthymic mood, full affect Neuro- strength and sensation are intact  LABS: Basic Metabolic Panel:  Recent Labs  11/15/14 0342 11/16/14 0455  NA 138 137  K 3.5 3.2*  CL 78* 70*  CO2 >50* >50*  GLUCOSE 198* 100*  BUN 34* 30*  CREATININE 0.45 0.48  CALCIUM 8.3* 8.5*  MG  --  2.0    ASSESSMENT AND PLAN:  Principal Problem:   Acute diastolic CHF (congestive heart failure) Active Problems:   Atrial fibrillation   COPD exacerbation   Respiratory failure  Acute on chronic respiratory failure   Congestive heart disease   Pleural effusion   S/P thoracentesis   Hypernatremia  1. Acute on Chronic Diastolic CHF:  On Lasix infusion. Good diuretic response. Would probably keep on IV lasix and metolazone overnight weekend and convert to oral on Monday. Continue strict I/Os.  2. Chronic Atrial Fibrillation: rate is well controlled.  Continue Xarelto for stroke prevention. Continue on telemetry.   3. Hypokalemia: primary team to replete  4. Hypernatremia: Na improving. Down from 150-->148.--> 138.--> 137                                            Thompson Grayer, MD 11/16/2014 8:32 AM

## 2014-11-16 NOTE — Progress Notes (Addendum)
[facility] PROGRESS NOTE  Jennifer Nielsen CZY:[id number] DOB: Apr 18, 1925 DOA: 11/07/2014 PCP: [name], MD  Brief summary: 81 yo female with atrial fibrillation, chronic diastolic heart failure, COPD, mild dementia who presented with acute on chronic hypoxia secondary to acute on chronic diastolic heart failure initially requiring BiPAP. She had pulm edema with large bilateral pleural effusions as well as leukocytosis. She was started on empiric abx and diuretics. She completed a course of anti-biotic's for pneumonia. The patient was resistant to low-mod dosed lasix and was transitioned to lasix gtt with better results. She also underwent thoracentesis with over 700cc of fluid removed.  Cardiology has been following and has added metolazone. Her course was complicated by hypernatremia from poor oral intake in the setting of diuresis but this resolved with dextrose fluids.  Anticipate a few more days of IV diuresis per cardiology and discharge sometime early this coming week.  Assessment/Plan: 1. Acute on chronic respiratory failure due to acute on chronic diastolic heart failure and possible sepsis secondary to pneumonia.  Also has been treated for possible COPD exacerbation 1. EF of 60-65%, unable to assess diastolic dysfunction,  PA peak pressure 59mmHg 2. Pt now remains on 2LNC 3. Weight  down 1 kg, -68mL yesterday 4. continue lasix gtt 5. Metolazone added 6/2 6. Appreciate cardiology assistance 7. Taper prednisone to 40 mg 8. Continue brovana and pulmicort 2. Left pleural effusion, large effusions seen on CT chest 1. s/p US guided thoracentesis yielding 150cc of fluid initially, but repeat thoracentesis on 5/31 yielded 682mL yellow fluid 2. Appreciate radiology assistance 3. Pleural fluid culture no growth final 3. Sepsis with HCAP 1. Clinically septic on presentation, improved 2. D/c vancomycin and cefepime on 6/1 3. Remains afebrile 4. ARF, resolved 1. Renal function remains at  or near baseline 2. Cont to monitor closely 5. Hyperkalemia/Hypokalemia 1. Was elevated on admit, resolved 2. Continue potassium in IV and oral supplementation prn 6. Chronic Afib 1. Currently rate controlled 2. On PRN IV lopressor 3. Continue xarelto 7. Dementia, at baseline is oriented to person, place and time and can carry on coherent conversation.   1. Stable 2. Pt remains with pleasant affect 8. Hypernatremia likely due to poor free water intake from dementia while being diuresed. Resolved with dextrose infusion 9. Macrocytic anemia due to chronic disease 1. Iron studies, B12, folate, TSH all wnl 10. V-tach, 5 beats 1. Keep electrolytes wnl 11. DVT prophylaxis, xarelto  Code Status: DNR Family Communication: Pt in room Disposition Plan: Pending improvement in dyspnea, transfer to floor.  PT/OT recommending skilled nursing facility   Consultants: Pulmonary Radiology Cardiology  Procedures:  Thoracentesis 5/30  Antibiotics:  Cefepime 5/27>>> 6/1  Vancomycin 5/27>>> 6/1  HPI/Subjective: Feeling well today.  Denies chest pain, shortness of breath, nausea.   Objective: Filed Vitals:   11/15/14 1947 11/15/14 2101 11/16/14 0506 11/16/14 0922  BP:  142/72 126/68   Pulse:  106 100   Temp:  98.1 F (36.7 C) 98 F (36.7 C)   TempSrc:  Oral Oral   Resp:  21 20   Height:      Weight:   82.827 kg (182 lb 9.6 oz)   SpO2: 98% 93% 98% 95%    Intake/Output Summary (Last 24 hours) at 11/16/14 1045 Last data filed at 11/16/14 1024  Gross per 24 hour  Intake    664 ml  Output   1425 ml  Net   -761 ml   Filed Weights   11/14/14 0500 11/15/14 0459 11/16/14  2751  Weight: 83.6 kg (184 lb 4.9 oz) 83.6 kg (184 lb 4.9 oz) 82.827 kg (182 lb 9.6 oz)    Exam:   General:  Awake,alert, answering questions, pleasant, [place] remains in place, in nad  Cardiovascular: IRRR, s1, s2  Respiratory: Less tachypneic with deeper breaths, still diminished at the bases with no rhonchi  or wheezing.   Abdomen: NABS, soft, nondistended, NT  Musculoskeletal: perfused, no clubbing, 1+ pitting edema bilateral lower extremities  Data Reviewed: Basic Metabolic Panel:  Recent Labs Lab 11/12/14 0345 11/13/14 0345 11/14/14 0402 11/15/14 0342 11/16/14 0455  NA 143 150* 148* 138 137  K 3.0* 3.3* 3.0* 3.5 3.2*  CL 93* 95* 89* 78* 70*  CO2 40* 44* 49* >50* >50*  GLUCOSE 163* 165* 185* 198* 100*  BUN 32* 33* 29* 34* 30*  CREATININE 0.57 0.58 0.44 0.45 0.48  CALCIUM 8.3* 8.6* 8.2* 8.3* 8.5*  MG  --  2.1  --   --  2.0   Liver Function Tests:  Recent Labs Lab 11/12/14 0345  AST 27  ALT 25  ALKPHOS 59  BILITOT 0.4  PROT 6.1*  ALBUMIN 2.4*   No results for input(s): LIPASE, AMYLASE in the last 168 hours. No results for input(s): AMMONIA in the last 168 hours. CBC:  Recent Labs Lab 11/10/14 0405 11/11/14 0356 11/12/14 0345 11/13/14 0345  WBC 8.5 11.2* 8.7 8.7  HGB 10.7* 10.7* 10.0* 11.6*  HCT 36.0 35.4* 34.7* 38.4  MCV 102.0* 99.4 100.9* 102.1*  PLT 291 344 315 342   Cardiac Enzymes: No results for input(s): CKTOTAL, CKMB, CKMBINDEX, TROPONINI in the last 168 hours. BNP (last 3 results)  Recent Labs  10/09/14 1753 11/07/14 0845 11/16/14 0455  BNP 309.6* 785.7* 225.8*    ProBNP (last 3 results)  Recent Labs  02/22/14 1417 06/17/14 1050  PROBNP 2244.0* 170.0*    CBG: No results for input(s): GLUCAP in the last 168 hours.  Recent Results (from the past 240 hour(s))  Blood culture (routine x 2)     Status: None   Collection Time: 11/07/14 10:01 AM  Result Value Ref Range Status   Specimen Description BLOOD RIGHT HAND  Final   Special Requests BOTTLES DRAWN AEROBIC AND ANAEROBIC 5ML  Final   Culture   Final    NO GROWTH 5 DAYS Performed at [facility]    Report Status 11/13/2014 FINAL  Final  Blood culture (routine x 2)     Status: None   Collection Time: 11/07/14 10:01 AM  Result Value Ref Range Status   Specimen  Description BLOOD RIGHT WRIST  Final   Special Requests BOTTLES DRAWN AEROBIC AND ANAEROBIC 5CC  Final   Culture   Final    NO GROWTH 5 DAYS Performed at [facility]    Report Status 11/13/2014 FINAL  Final  MRSA PCR Screening     Status: None   Collection Time: 11/07/14  1:41 PM  Result Value Ref Range Status   MRSA by PCR NEGATIVE NEGATIVE Final    Comment:        The GeneXpert MRSA Assay (FDA approved for NASAL specimens only), is one component of a comprehensive MRSA colonization surveillance program. It is not intended to diagnose MRSA infection nor to guide or monitor treatment for MRSA infections.   Body fluid culture     Status: None   Collection Time: 11/11/14 11:35 AM  Result Value Ref Range Status   Specimen Description PLEURAL LEFT  Final  Special Requests NONE  Final   Gram Stain   Final    FEW WBC PRESENT,BOTH PMN AND MONONUCLEAR NO ORGANISMS SEEN Performed at [facility]    Culture   Final    NO GROWTH 3 DAYS Performed at [facility]    Report Status 11/15/2014 FINAL  Final     Studies: Dg Chest Port 1 View  11/14/2014   CLINICAL DATA:  PICC line placement.  EXAM: PORTABLE CHEST - 1 VIEW  COMPARISON:  11/12/2014 and 11/11/2014.  FINDINGS: 1648 hours. Patient is rotated to the right. There is a new right arm PICC projected inferiorly over the SVC right atrial junction. No pneumothorax. Cardiomegaly, bilateral pleural effusions and bibasilar pulmonary opacities are unchanged. There are glenohumeral degenerative changes bilaterally.  IMPRESSION: Right arm PICC placement as described without evidence of pneumothorax. No significant change in bilateral pleural effusions and bibasilar pulmonary opacities.   Electronically Signed   By: Richardean Sale M.D.   On: 11/14/2014 17:12    Scheduled Meds: . antiseptic oral rinse  7 mL Mouth Rinse BID  . arformoterol  15 mcg Nebulization BID  . budesonide  0.25 mg Nebulization BID  . feeding  supplement (ENSURE ENLIVE)  237 mL Oral BID BM  . metolazone  2.5 mg Oral Daily  . potassium chloride  40 mEq Oral Once  . predniSONE  60 mg Oral Q breakfast  . rivaroxaban  20 mg Oral Daily  . sodium chloride  10-40 mL Intracatheter Q12H   Continuous Infusions: . furosemide (LASIX) infusion 8 mg/hr (11/16/14 0433)    Principal Problem:   Acute diastolic CHF (congestive heart failure) Active Problems:   Atrial fibrillation   COPD exacerbation   Respiratory failure   Acute on chronic respiratory failure   Congestive heart disease   Pleural effusion   S/P thoracentesis   Hypernatremia   Jennifer Nielsen  [facility] Pager [phone]. If 7PM-7AM, please contact night-coverage at [email or web address], password [facility] 11/16/2014, 10:45 AM  LOS: 9 days

## 2014-11-16 NOTE — Progress Notes (Signed)
Patient had 5 beats of V-tach this am.  Patient found sleeping with no complaints.  Notified NP on call.  New orders received for lab work.  Will continue to monitor patient.

## 2014-11-17 DIAGNOSIS — J9621 Acute and chronic respiratory failure with hypoxia: Secondary | ICD-10-CM

## 2014-11-17 LAB — BASIC METABOLIC PANEL
BUN: 34 mg/dL — ABNORMAL HIGH (ref 6–20)
CO2: >50 mmol/L — ABNORMAL HIGH (ref 22–32)
CREATININE: 0.44 mg/dL (ref 0.44–1.00)
Calcium: 8.4 mg/dL — ABNORMAL LOW (ref 8.9–10.3)
Chloride: 68 mmol/L — ABNORMAL LOW (ref 101–111)
GLUCOSE: 135 mg/dL — AB (ref 65–99)
POTASSIUM: 3 mmol/L — AB (ref 3.5–5.1)
Sodium: 135 mmol/L (ref 135–145)

## 2014-11-17 NOTE — Progress Notes (Signed)
OT Note  Patient Details Name: Pamlea Finder MRN: [id number] DOB: 15-Mar-1925   Cancelled Treatment:    Reason Eval/Treat Not Completed: Other (Patient is from a SNF where she is non-ambulatory at baseline and requires A with all ADLs. All OT needs can be met at SNF once she returns. OT will sign off.)  Edwen Mclester A 11/17/2014, 8:01 AM

## 2014-11-17 NOTE — Progress Notes (Signed)
PROGRESS NOTE  Imo Cumbie QBH:[id number] DOB: May 28, 1925 DOA: 11/07/2014 PCP: [name], MD  Summary: 81 year old woman with atrial fibrillation, chronic diastolic heart failure, COPD, mild dementia presented with acute on chronic hypoxic respiratory failure requiring BiPAP. Treated with antibiotics for bilateral pleural effusions and underwent thoracentesis with removal of 700 mL of fluid. Seen by cardiology and treated with Lasix infusion for diuresis. Course complicated by hypernatremia from poor oral intake which is now resolved with fluids.  Assessment/Plan: 1. Acute on chronic diastolic congestive heart failure. Improving. Excellent diuresis.Continue management per cardiology. 2. Acute on chronic hypoxic respiratory failure secondary to acute heart failure as above. Now on baseline 2 L nasal cannula. 3. Sepsis, HCAP on admission. Treated with antibiotics. 4. Bilateral pleural effusions. Status post thoracentesis 2. Pleural fluid culture no growth. 5. Acute kidney injury. Resolved. 6. COPD exacerbation. Appears resolved.  7. Chronic hypoxic respiratory failure. Stable. 8. Chronic atrial fibrillation. Xarelto.  9. Hypernatremia. Secondary to poor oral intake. Resolved. 10. Hyperkalemia on admission, resolved. Hypokalemia. 11. Dementia. Macrocytic anemia secondary to chronic disease.   Overall improving.  Continue Lasix infusion per cardiology.  Replete potassium.  Discussed with 2 daughters at bedside.   Possible discharge 1-2 days.  Code Status: DNR DVT prophylaxis: Xarelto Family Communication:  Disposition Plan: SNF  Murray Hodgkins, MD  [facility]  Pager [phone] If 7PM-7AM, please contact night-coverage at [email or web address], password [email or web address] 11/17/2014, 4:55 PM  LOS: 10 days   Consultants: Pulmonary Radiology  Procedures:  Thoracentesis 5/30  Antibiotics:  Cefepime 5/27>>> 6/1  Vancomycin 5/27>>> 6/1  HPI/Subjective: Feeling fine, breathing ok,  eating ok, no pain. No complaints.  Objective: Filed Vitals:   11/17/14 0435 11/17/14 1112 11/17/14 1118 11/17/14 1355  BP: 141/72   140/68  Pulse: 101   92  Temp: 98.7 F (37.1 C)   98.4 F (36.9 C)  TempSrc: Oral   Oral  Resp: 22   22  Height:      Weight: 81.92 kg (180 lb 9.6 oz)     SpO2: 95% 96% 96% 96%    Intake/Output Summary (Last 24 hours) at 11/17/14 1655 Last data filed at 11/17/14 1610  Gross per 24 hour  Intake    876 ml  Output   4275 ml  Net  -3399 ml     Filed Weights   11/15/14 0459 11/16/14 0506 11/17/14 0435  Weight: 83.6 kg (184 lb 4.9 oz) 82.827 kg (182 lb 9.6 oz) 81.92 kg (180 lb 9.6 oz)    Exam:     Afebrile, vital signs stable. Stable SPO2 on 2 L. General: Appears calm and comfortable, lying flat in bed. Cardiovascular: RRR, no m/r/g. No LE edema. Telemetry: SR, no arrhythmias  Respiratory: CTA bilaterally, no w/r/r. Normal respiratory effort. Abdomen: soft, ntnd Psychiatric: grossly normal mood and affect, speech fluent and appropriate Neurologic: grossly non-focal.  New data reviewed: Urine output 3900. -7.6 L since admission.  Potassium 3.0. Sodium 135. Creatinine stable 0.44.  Scheduled Meds: . antiseptic oral rinse  7 mL Mouth Rinse BID  . arformoterol  15 mcg Nebulization BID  . budesonide  0.25 mg Nebulization BID  . feeding supplement (ENSURE ENLIVE)  237 mL Oral BID BM  . predniSONE  40 mg Oral Q breakfast  . rivaroxaban  20 mg Oral Daily  . sodium chloride  10-40 mL Intracatheter Q12H   Continuous Infusions: . furosemide (LASIX) infusion 8 mg/hr (11/17/14 0916)    Principal Problem:   Acute diastolic CHF (congestive heart failure) Active  Problems:   Atrial fibrillation   COPD exacerbation   Respiratory failure   Acute on chronic respiratory failure   Congestive heart disease   Pleural effusion   S/P thoracentesis   Hypernatremia   Time spent 20 minutes

## 2014-11-17 NOTE — Progress Notes (Signed)
   SUBJECTIVE: The patient is doing well today.  No very verbal.  No complaints.   Marland Kitchen antiseptic oral rinse  7 mL Mouth Rinse BID  . arformoterol  15 mcg Nebulization BID  . budesonide  0.25 mg Nebulization BID  . feeding supplement (ENSURE ENLIVE)  237 mL Oral BID BM  . metolazone  2.5 mg Oral Daily  . predniSONE  40 mg Oral Q breakfast  . rivaroxaban  20 mg Oral Daily  . sodium chloride  10-40 mL Intracatheter Q12H   . furosemide (LASIX) infusion 8 mg/hr (11/16/14 0433)    OBJECTIVE: Physical Exam: Filed Vitals:   11/16/14 1427 11/16/14 2048 11/16/14 2107 11/17/14 0435  BP: 104/61  133/55 141/72  Pulse: 100  107 101  Temp: 97.5 F (36.4 C)  98.4 F (36.9 C) 98.7 F (37.1 C)  TempSrc: Oral  Oral Oral  Resp: 24  22 22   Height:      Weight:    81.92 kg (180 lb 9.6 oz)  SpO2: 95% 92% 95% 95%    Intake/Output Summary (Last 24 hours) at 11/17/14 0805 Last data filed at 11/17/14 0600  Gross per 24 hour  Intake    904 ml  Output   3900 ml  Net  -2996 ml    Telemetry reveals afib, NSVT  GEN- The patient is elderly appearing, sleeping but rouses Head- normocephalic, atraumatic Eyes-  Sclera clear, conjunctiva pink Ears- hearing intact Oropharynx- clear Neck- supple, + JVD Lungs- decreased BS at bases  normal work of breathing Heart- irregular rate and rhythm  GI- soft, NT, ND, + BS Extremities- no clubbing, cyanosis,+ dependant edema Skin- no rash or lesion Psych- euthymic mood, full affect Neuro- strength and sensation are intact  LABS: Basic Metabolic Panel:  Recent Labs  11/16/14 0455 11/17/14 0400  NA 137 135  K 3.2* 3.0*  CL 70* 68*  CO2 >50* >50*  GLUCOSE 100* 135*  BUN 30* 34*  CREATININE 0.48 0.44  CALCIUM 8.5* 8.4*  MG 2.0  --     ASSESSMENT AND PLAN:  Principal Problem:   Acute diastolic CHF (congestive heart failure) Active Problems:   Atrial fibrillation   COPD exacerbation   Respiratory failure   Acute on chronic respiratory  failure   Congestive heart disease   Pleural effusion   S/P thoracentesis   Hypernatremia  1. Acute on Chronic Diastolic CHF:  On Lasix infusion. Good diuretic response. Would probably keep on IV lasix and metolazone overnight weekend and convert to oral on Monday. Continue strict I/Os.  2. Chronic Atrial Fibrillation: rate is well controlled.  Continue Xarelto for stroke prevention. Continue on telemetry.   3. Hypokalemia: primary team to replete  4. Hypernatremia: Na improving. Down from 150-->135                                           Thompson Grayer, MD 11/17/2014 8:05 AM

## 2014-11-18 LAB — BASIC METABOLIC PANEL
BUN: 32 mg/dL — ABNORMAL HIGH (ref 6–20)
CALCIUM: 9 mg/dL (ref 8.9–10.3)
Chloride: 67 mmol/L — ABNORMAL LOW (ref 101–111)
Creatinine, Ser: 0.58 mg/dL (ref 0.44–1.00)
GFR calc Af Amer: >60 mL/min (ref >60)
GLUCOSE: 109 mg/dL — AB (ref 65–99)
POTASSIUM: 2.4 mmol/L — AB (ref 3.5–5.1)
Sodium: 139 mmol/L (ref 135–145)

## 2014-11-18 LAB — MAGNESIUM: Magnesium: 2.1 mg/dL (ref 1.7–2.4)

## 2014-11-18 MED ORDER — FUROSEMIDE 40 MG PO TABS
40.0000 mg | ORAL_TABLET | Freq: Two times a day (BID) | ORAL | Status: DC
  Administered 2014-11-18 – 2014-11-20 (×4): 40 mg via ORAL
  Filled 2014-11-18 (×4): qty 1

## 2014-11-18 MED ORDER — POLYETHYLENE GLYCOL 3350 17 G PO PACK
17.0000 g | PACK | Freq: Two times a day (BID) | ORAL | Status: DC
  Administered 2014-11-18 – 2014-11-20 (×4): 17 g via ORAL
  Filled 2014-11-18 (×4): qty 1

## 2014-11-18 MED ORDER — POTASSIUM CHLORIDE 10 MEQ/100ML IV SOLN
10.0000 meq | INTRAVENOUS | Status: AC
  Administered 2014-11-18 (×6): 10 meq via INTRAVENOUS
  Filled 2014-11-18 (×5): qty 100

## 2014-11-18 MED ORDER — POTASSIUM CHLORIDE CRYS ER 20 MEQ PO TBCR
40.0000 meq | EXTENDED_RELEASE_TABLET | Freq: Once | ORAL | Status: AC
  Administered 2014-11-18: 40 meq via ORAL
  Filled 2014-11-18: qty 2

## 2014-11-18 MED ORDER — POTASSIUM CHLORIDE CRYS ER 20 MEQ PO TBCR
40.0000 meq | EXTENDED_RELEASE_TABLET | ORAL | Status: DC
  Filled 2014-11-18: qty 2

## 2014-11-18 NOTE — Progress Notes (Signed)
Nutrition Follow-up  DOCUMENTATION CODES:  Not applicable  INTERVENTION: -Continue Ensure Enlive BID and Magic Cup BID - RD to continue to monitor for needs  NUTRITION DIAGNOSIS:  Inadequate oral intake related to lethargy / confusion as evidenced by other (see comment) (poor po). -ongoing  GOAL:  Patient will meet greater than or equal to 90% of their needs -variably met  MONITOR:  PO intake, Supplement acceptance, Labs, Weight trends  ASSESSMENT: 81 y/o female with h/o chronic diastolic HF, chronic atrial fibrillation on Xarelto, admitted for acute respiratory failure, in the setting of sepsis with HCAP, acute diastolic CHF, and bilateral pleural effusions.   6/1: - Pt with dementia. Nutritional history obtained from chart and RN.  - Per RN, pt ate ~50% of her breakfast this morning. She is swallowing food and liquids adequately. Liquid intake is fair.  - Of note, pt with chocolate allergy. Supplements must be flavor other than chocolate.  - Pt with weight loss since admission. Most likely due to diuresis. -3.7 L since admission per cardiology note.  - RD to order nutritional supplements. Current diet is soft diet/2 g Na.   6/6: - Pt eating 20-90% since last assessment - Pt asleep at time of visit; RN reports tech attempted to feed pt breakfast and pt refused - Breakfast tray untouched and no Ensure Enlive or Magic Cup present on this tray - Unsure of supplement intakes at this time - Variably meeting needs  Labs: K: 2.4 mmol/L, Cl: 67 mmol/L, BUN elevated  Height:  Ht Readings from Last 1 Encounters:  11/07/14 $RemoveB'5\' 7"'pTVHNoTj$  (1.702 m)    Weight:  Wt Readings from Last 1 Encounters:  11/18/14 177 lb 6.4 oz (80.468 kg)    Ideal Body Weight:  61.5 kg  Wt Readings from Last 10 Encounters:  11/18/14 177 lb 6.4 oz (80.468 kg)  10/14/14 181 lb 6.4 oz (82.283 kg)  10/12/14 184 lb 8.4 oz (83.7 kg)  09/20/14 188 lb (85.276 kg)  09/19/14 190 lb (86.183 kg)  09/17/14  185 lb (83.915 kg)  08/20/14 186 lb 3.2 oz (84.46 kg)  06/18/14 204 lb 3.2 oz (92.625 kg)  04/18/14 200 lb 6.4 oz (90.901 kg)  02/05/14 208 lb (94.348 kg)    BMI:  Body mass index is 27.78 kg/(m^2).  Estimated Nutritional Needs:  Kcal:  1900-2100  Protein:  100-110 g  Fluid:  2.0 L/day  Skin:  Reviewed, no issues  Diet Order:  DIET SOFT Room service appropriate?: Yes; Fluid consistency:: Thin  EDUCATION NEEDS:      Intake/Output Summary (Last 24 hours) at 11/18/14 0955 Last data filed at 11/18/14 0418  Gross per 24 hour  Intake    266 ml  Output   1975 ml  Net  -1709 ml    Last BM:  No documentation   Jarome Matin, RD, LDN Inpatient Clinical Dietitian Pager # [phone] After hours/weekend pager # [phone]

## 2014-11-18 NOTE — Progress Notes (Signed)
PROGRESS NOTE  Lilah Mijangos UMP:[id number] DOB: 1924-12-28 DOA: 11/07/2014 PCP: [name], MD  Summary: 80 year old woman with atrial fibrillation, chronic diastolic heart failure, COPD, mild dementia presented with acute on chronic hypoxic respiratory failure requiring BiPAP. Treated with antibiotics for bilateral pleural effusions and underwent thoracentesis with removal of 700 mL of fluid. Seen by cardiology and treated with Lasix infusion for diuresis. Course complicated by hypernatremia from poor oral intake which is now resolved with fluids.  Assessment/Plan: 1. Acute on chronic diastolic congestive heart failure. Appears compensated at this point. 2. Acute on chronic hypoxic respiratory failure secondary to acute heart failure as above. Stable. On baseline 2 L nasal cannula. 3. Sepsis, HCAP on admission. Resolved. Treated with antibiotics. 4. Bilateral pleural effusions. Status post thoracentesis 2. Pleural fluid culture no growth. 5. Acute kidney injury. Resolved. 6. COPD exacerbation. Resolved.  7. Chronic hypoxic respiratory failure. Stable. 8. Chronic atrial fibrillation. Xarelto.  9. Hypernatremia. Secondary to poor oral intake. Resolved. 10. Hyperkalemia on admission, resolved. Hypokalemia noted. 11. Dementia.  12. Macrocytic anemia secondary to chronic disease.   Overall improved.   Change to oral Lasix, follow I/O  Encourage diet and oral medications.  Replete potassium. Magnesium normal  Possible discharge next 48 hours.  Discussed with daughter at bedside.  Code Status: DNR DVT prophylaxis: Xarelto Family Communication:  Disposition Plan: SNF  Murray Hodgkins, MD  [facility]  Pager [phone] If 7PM-7AM, please contact night-coverage at [email or web address], password [email or web address] 11/18/2014, 4:38 PM  LOS: 11 days   Consultants: Pulmonary Radiology  Procedures:  Thoracentesis 5/30  Antibiotics:  Cefepime 5/27>>> 6/1  Vancomycin 5/27>>>  6/1  HPI/Subjective: Patient has no complaints but with dementia and her history is somewhat limited. Per nursing she has not eaten well today and has refused some oral medications including potassium. Daughter at bedside.  Objective: Filed Vitals:   11/18/14 0417 11/18/14 0801 11/18/14 1321 11/18/14 1600  BP: 158/77  118/61 102/56  Pulse: 95  65 127  Temp: 97.3 F (36.3 C)  97.6 F (36.4 C)   TempSrc: Oral  Oral   Resp: 20  28   Height:      Weight: 80.468 kg (177 lb 6.4 oz)     SpO2: 96% 97% 96%     Intake/Output Summary (Last 24 hours) at 11/18/14 1638 Last data filed at 11/18/14 1300  Gross per 24 hour  Intake    226 ml  Output   1925 ml  Net  -1699 ml     Filed Weights   11/16/14 0506 11/17/14 0435 11/18/14 0417  Weight: 82.827 kg (182 lb 9.6 oz) 81.92 kg (180 lb 9.6 oz) 80.468 kg (177 lb 6.4 oz)    Exam:    Afebrile, VSS General:  Appears comfortable, calm. Cardiovascular: Regular rate and rhythm, no murmur, rub or gallop. No lower extremity edema. Respiratory: Clear to auscultation bilaterally, no wheezes, rales or rhonchi. Normal respiratory effort. Musculoskeletal: grossly normal tone bilateral upper and lower extremities Psychiatric: grossly normal mood and affect, speech fluent and appropriate  New data reviewed:  Urine output 2575. -9.7 L since admission.   Potassium 2.4. Sodium 139. Creatinine stable 0.58  Magnesium 2.1  Scheduled Meds: . antiseptic oral rinse  7 mL Mouth Rinse BID  . arformoterol  15 mcg Nebulization BID  . budesonide  0.25 mg Nebulization BID  . feeding supplement (ENSURE ENLIVE)  237 mL Oral BID BM  . potassium chloride  10 mEq Intravenous Q1 Hr x 6  . predniSONE  40 mg Oral Q breakfast  . rivaroxaban  20 mg Oral Daily  . sodium chloride  10-40 mL Intracatheter Q12H   Continuous Infusions: . furosemide (LASIX) infusion 8 mg/hr (11/18/14 1512)    Principal Problem:   Acute diastolic CHF (congestive heart  failure) Active Problems:   Atrial fibrillation   COPD exacerbation   Respiratory failure   Acute on chronic respiratory failure   Congestive heart disease   Pleural effusion   S/P thoracentesis   Hypernatremia   Time spent 20 minutes

## 2014-11-18 NOTE — Care Management Note (Signed)
Case Management Note  Patient Details  Name: Shanina Kepple MRN: [id number] Date of Birth: 02-Feb-1925  Subjective/Objective: cardio following-iv lasix gtt, chronic afib-xarelto.strict i/o.                   Action/Plan:d/c plan SNF.   Expected Discharge Date:   (unknown)               Expected Discharge Plan:  [place]  In-House Referral:     Discharge planning Services  CM Consult  Post Acute Care Choice:    Choice offered to:     DME Arranged:    DME Agency:     HH Arranged:    HH Agency:     Status of Service:  In process, will continue to follow  Medicare Important Message Given:    Date Medicare IM Given:    Medicare IM give by:    Date Additional Medicare IM Given:    Additional Medicare Important Message give by:     If discussed at [place] of Stay Meetings, dates discussed:    Additional Comments:  Dessa Phi, RN 11/18/2014, 2:03 PM

## 2014-11-18 NOTE — Progress Notes (Signed)
Patient Profile: 81 y/o female with h/o chronic diastolic HF, chronic atrial fibrillation on Xarelto, admitted for acute respiratory failure, in the setting of sepsis with HCAP, acute diastolic CHF, and bilateral pleural effusions. Nielsen/p lt thoracentesis 650 cc   Subjective: No chest pain, no SOB, but now refusing K+  Objective: Vital signs in last 24 hours: Temp:  [97.3 F (36.3 C)-98.4 F (36.9 C)] 97.3 F (36.3 C) (06/06 0417) Pulse Rate:  [92-96] 95 (06/06 0417) Resp:  [20-22] 20 (06/06 0417) BP: (140-158)/(68-79) 158/77 mmHg (06/06 0417) SpO2:  [93 %-97 %] 97 % (06/06 0801) Weight:  [177 lb 6.4 oz (80.468 kg)] 177 lb 6.4 oz (80.468 kg) (06/06 0417) Weight change: -3 lb 3.2 oz (-1.452 kg) Last BM Date: 11/13/14 Intake/Output from previous day: -2073  (since admit  -9,760)  Wt 177.6 down from pk wt of 191.   06/05 0701 - 06/06 0700 In: 502 [P.O.:396; I.V.:106] Out: 2575 [Urine:2575] Intake/Output this shift:    PE: General:Pleasant affect, NAD Skin:Warm and dry, brisk capillary refill HEENT:normocephalic, sclera clear, mucus membranes moist Heart:irreg irreg without murmur, gallup, rub or click Lungs:clear, ant.  without rales, rhonchi, or wheezes ZLD:JTTS, non tender, + BS, do not palpate liver spleen or masses Ext:no lower ext edema, , 2+ radial pulses Neuro:alert and oriented X 3, but confused with meds.   MAE, follows commands, + facial symmetry Tele:  A fib from 80s to 145.      Lab Results: No results for input(Nielsen): WBC, HGB, HCT, PLT in the last 72 hours. BMET  Recent Labs  11/17/14 0400 11/18/14 0430  NA 135 139  K 3.0* 2.4*  CL 68* 67*  CO2 >50* >50*  GLUCOSE 135* 109*  BUN 34* 32*  CREATININE 0.44 0.58  CALCIUM 8.4* 9.0   No results for input(Nielsen): TROPONINI in the last 72 hours.  Invalid input(Nielsen): CK, MB  No results found for: CHOL, HDL, LDLCALC, LDLDIRECT, TRIG, CHOLHDL No results found for: HGBA1C   Lab Results  Component Value  Date   TSH 0.834 11/14/2014      Studies/Results: ECHO:  Left ventricle: The cavity size was normal. Wall thickness was at the upper limits of normal. Systolic function was normal. The estimated ejection fraction was in the range of 60% to 65%. Wall motion was normal; there were no regional wall motion abnormalities. The study is not technically sufficient to allow evaluation of LV diastolic function. - Aortic valve: Mildly calcified annulus. Trileaflet. There was mild regurgitation. - Mitral valve: Calcified annulus. There was mild regurgitation. - Left atrium: The atrium was moderately dilated. - Right ventricle: Systolic function was mildly reduced. - Right atrium: Central venous pressure (est): 8 mm Hg. - Tricuspid valve: There was mild regurgitation. - Pulmonary arteries: Systolic pressure was moderately to severely increased. PA peak pressure: 59 mm Hg (Nielsen). - Pericardium, extracardiac: There was no pericardial effusion.  Impressions:  - Upper normal LV wall thickness with LVEF 60-65%, indeterminate diastolic function. Moderate left atrial enlargement. Mild mitral regurgitation. Mildly sclerotic aortic valve with mild aortic regurgitation. Mildly reduced RV contraction. Mild tricuspid regurgitation with PASP 59 mmHg, moderate to severe pulmonary hypertension.  Medications: I have reviewed the patient'Nielsen current medications. Scheduled Meds: . antiseptic oral rinse  7 mL Mouth Rinse BID  . arformoterol  15 mcg Nebulization BID  . budesonide  0.25 mg Nebulization BID  . feeding supplement (ENSURE ENLIVE)  237 mL Oral BID BM  . potassium  chloride  40 mEq Oral 6 times per day  . predniSONE  40 mg Oral Q breakfast  . rivaroxaban  20 mg Oral Daily  . sodium chloride  10-40 mL Intracatheter Q12H   Continuous Infusions: . furosemide (LASIX) infusion 8 mg/hr (11/17/14 0916)   PRN Meds:.albuterol, ipratropium-albuterol, metoprolol, sodium  chloride  Assessment/Plan: Principal Problem:   Acute diastolic CHF (congestive heart failure) Active Problems:   Atrial fibrillation   COPD exacerbation   Respiratory failure   Acute on chronic respiratory failure   Congestive heart disease   Pleural effusion   Nielsen/P thoracentesis   Hypernatremia  1. Acute on Chronic Diastolic CHF: admit BNP 256.3  Today at 225.    2D echo 5/29 showed normal LVF with EF at 60-65%. On Lasix infusion and metolazone has been stopped.   Good diuretic response. I/Os net negative 9.7 L out since admit. Renal function is normal with SCr at 0.58 today. Continue diuresis with IV Lasix drip- though may be time to decrease and change to po or intermittent IV lasix , Dr. Irish Lack to see. Continue strict I/Os.  2. Chronic Atrial Fibrillation: rate is well controlled in the 80s though occ brief elevation to 140s.. Continue Xarelto for a/c. Continue on telemetry.   3. Hypokalemia: K is 2.4 . Supplement with K-dur. Now on Kdur 40 meq every 4 hours for 6 X a day.  Monitor closely in the setting of IV diuretic therapy.   4. Hypernatremia: Na improving. Down from 150-->148.--> 138--> 139.   LOS: 11 days   Time spent with pt. :  15 minutes. [facility] R  Nurse Practitioner Certified Pager [phone] or after 5pm and on weekends call [phone] 11/18/2014, 11:37 AM   I have examined the patient and reviewed assessment and plan and discussed with patient.  Agree with above as stated.  Patient is willing to try oral Lasix tomorrow, but does not want to try tonight.  Difficult to understand her reasoning.  She only wants her pills crushed in something like applesauce.    Jennifer Nielsen.

## 2014-11-19 DIAGNOSIS — I509 Heart failure, unspecified: Secondary | ICD-10-CM

## 2014-11-19 LAB — BASIC METABOLIC PANEL
BUN: 42 mg/dL — ABNORMAL HIGH (ref 6–20)
CHLORIDE: 72 mmol/L — AB (ref 101–111)
CREATININE: 0.67 mg/dL (ref 0.44–1.00)
Calcium: 9 mg/dL (ref 8.9–10.3)
GFR calc Af Amer: >60 mL/min (ref >60)
GFR calc non Af Amer: >60 mL/min (ref >60)
Glucose, Bld: 128 mg/dL — ABNORMAL HIGH (ref 65–99)
Potassium: 3.9 mmol/L (ref 3.5–5.1)
Sodium: 139 mmol/L (ref 135–145)

## 2014-11-19 MED ORDER — SENNA 8.6 MG PO TABS
1.0000 | ORAL_TABLET | Freq: Every day | ORAL | Status: DC
  Administered 2014-11-19: 8.6 mg via ORAL
  Filled 2014-11-19: qty 1

## 2014-11-19 MED ORDER — DILTIAZEM HCL ER COATED BEADS 120 MG PO CP24: 120.0000 mg | ORAL_CAPSULE | Freq: Every day | ORAL | Status: DC

## 2014-11-19 MED ORDER — POTASSIUM CHLORIDE CRYS ER 20 MEQ PO TBCR
40.0000 meq | EXTENDED_RELEASE_TABLET | Freq: Once | ORAL | Status: AC
  Administered 2014-11-19: 40 meq via ORAL
  Filled 2014-11-19: qty 2

## 2014-11-19 MED ORDER — PREDNISONE 20 MG PO TABS
20.0000 mg | ORAL_TABLET | Freq: Every day | ORAL | Status: DC
  Administered 2014-11-20: 20 mg via ORAL
  Filled 2014-11-19: qty 1

## 2014-11-19 MED ORDER — DILTIAZEM HCL ER COATED BEADS 180 MG PO CP24
180.0000 mg | ORAL_CAPSULE | Freq: Every day | ORAL | Status: DC
  Administered 2014-11-19 – 2014-11-20 (×2): 180 mg via ORAL
  Filled 2014-11-19 (×2): qty 1

## 2014-11-19 NOTE — Progress Notes (Signed)
PROGRESS NOTE  Melena Hayes JIR:[id number] DOB: Jan 15, 1925 DOA: 11/07/2014 PCP: [name], MD  Summary: 81 year old woman with atrial fibrillation, chronic diastolic heart failure, COPD, mild dementia presented with acute on chronic hypoxic respiratory failure requiring BiPAP, thought to be secondary to CHF, pneumonia and pleural effusions. Treated with antibiotics for bilateral pleural effusions and underwent thoracentesis. Seen by cardiology and treated with Lasix infusion for diuresis with excellent results. Course complicated by hypernatremia from poor oral intake which is now resolved with fluids.  Assessment/Plan: 1. Acute on chronic diastolic congestive heart failure. Appears compensated at this point. Now on oral Lasix. 2. Acute on chronic hypoxic respiratory failure secondary to acute heart failure as above. Acute on chronic hypercapnic respiratory failure secondary to same versus COPD. Stable. On baseline 2 L nasal cannula. 3. Sepsis, HCAP on admission. Resolved. Treated with antibiotics. 4. Bilateral pleural effusions. Status post thoracentesis 2. Pleural fluid culture no growth. 5. Acute kidney injury. Resolved. 6. COPD exacerbation. Resolved. 7. Demand ischemia. Secondary to CHF and sepsis.  8. Chronic hypoxic respiratory failure. Remains stable. 9. Chronic atrial fibrillation. Stable on Xarelto and diltiazem.  10. Hypernatremia. Secondary to poor oral intake. Resolved. 11. Hyperkalemia on admission, resolved. 12. Dementia. Stable. 13. Macrocytic anemia secondary to chronic disease.   Overall appears to be improving. Her last chest x-ray showed improvement in effusions. Her oxygenation is stable as is her volume status.  Oral intake poor last 24 hours although she is not taking medications again.  Increase Cardizem given borderline rate control. Continue oral Lasix. Continue potassium repletion.  We will discuss with cardiology. Anticipate discharge next 24 hours.  Code  Status: DNR DVT prophylaxis: Xarelto Family Communication:  Disposition Plan: SNF  Murray Hodgkins, MD  [facility]  Pager [phone] If 7PM-7AM, please contact night-coverage at [email or web address], password [facility] 11/19/2014, 9:19 AM  LOS: 12 days   Consultants:  Pulmonary  Cardiology  Radiology  Procedures:  Thoracentesis 5/30 with removal of 150 mL   thoracentesis 5/31 removal 650 mL  Echocardiogram Impressions:  - Upper normal LV wall thickness with LVEF 60-65%, indeterminate diastolic function. Moderate left atrial enlargement. Mild mitral regurgitation. Mildly sclerotic aortic valve with mild aortic regurgitation. Mildly reduced RV contraction. Mild tricuspid regurgitation with PASP 59 mmHg, moderate to severe pulmonary hypertension.  Antibiotics:  Cefepime 5/27>>> 6/1  Vancomycin 5/27>>> 6/1  HPI/Subjective: No complaints, no pain. Per RN patient taking Ensure and PO meds. No concerns.  Objective: Filed Vitals:   11/18/14 1942 11/18/14 2111 11/19/14 0546 11/19/14 0825  BP:  109/56 125/75   Pulse:  121 92   Temp:  98.2 F (36.8 C) 97.7 F (36.5 C)   TempSrc:  Oral Oral   Resp:  20 19   Height:      Weight:   76.93 kg (169 lb 9.6 oz)   SpO2: 94% 98% 96% 94%    Intake/Output Summary (Last 24 hours) at 11/19/14 0919 Last data filed at 11/19/14 0548  Gross per 24 hour  Intake  798.8 ml  Output    925 ml  Net -126.2 ml     Filed Weights   11/17/14 0435 11/18/14 0417 11/19/14 0546  Weight: 81.92 kg (180 lb 9.6 oz) 80.468 kg (177 lb 6.4 oz) 76.93 kg (169 lb 9.6 oz)    Exam:    Afebrile, vital signs are stable. Stable hypoxia on 3 L. General:  Appears calm and comfortable Cardiovascular: Irregular, normal rate, no m/r/g. No LE edema. Telemetry: Atrial fibrillation Respiratory: CTA bilaterally, no  w/r/r. Normal respiratory effort. Psychiatric: grossly normal mood and affect, speech fluent and appropriate Neurologic: grossly  non-focal.  New data reviewed:  Urine output 925, -9.8  L since admission.   Potassium is normalized, 3.9. Creatinine remains normal.  Cytology pleural fluid: Reactive mesothelial cells  Scheduled Meds: . antiseptic oral rinse  7 mL Mouth Rinse BID  . arformoterol  15 mcg Nebulization BID  . budesonide  0.25 mg Nebulization BID  . diltiazem  120 mg Oral Daily  . feeding supplement (ENSURE ENLIVE)  237 mL Oral BID BM  . furosemide  40 mg Oral BID  . polyethylene glycol  17 g Oral BID  . potassium chloride  40 mEq Oral Once  . predniSONE  40 mg Oral Q breakfast  . rivaroxaban  20 mg Oral Daily  . sodium chloride  10-40 mL Intracatheter Q12H   Continuous Infusions:    Principal Problem:   Acute diastolic CHF (congestive heart failure) Active Problems:   Atrial fibrillation   COPD exacerbation   Respiratory failure   Acute on chronic respiratory failure   Congestive heart disease   Pleural effusion   S/P thoracentesis   Hypernatremia   Time spent 35 minutes

## 2014-11-19 NOTE — Progress Notes (Signed)
Patient Profile: 81 y/o female with h/o chronic diastolic HF, chronic atrial fibrillation on Xarelto, admitted for acute respiratory failure, in the setting of sepsis with HCAP, acute diastolic CHF, and bilateral pleural effusions. S/p lt thoracentesis 650 cc   Subjective: Feeling better  Objective: Vital signs in last 24 hours: Temp:  [97.6 F (36.4 C)-98.2 F (36.8 C)] 97.7 F (36.5 C) (06/07 0546) Pulse Rate:  [65-127] 92 (06/07 0546) Resp:  [19-28] 19 (06/07 0546) BP: (102-125)/(56-75) 125/75 mmHg (06/07 0546) SpO2:  [94 %-98 %] 96 % (06/07 0546) Weight:  [169 lb 9.6 oz (76.93 kg)] 169 lb 9.6 oz (76.93 kg) (06/07 0546) Weight change: -7 lb 12.8 oz (-3.538 kg) Last BM Date: 11/13/14 Intake/Output from previous day: -126 ( total -9887 since admit) 06/06 0701 - 06/07 0700 In: 798.8 [P.O.:90; I.V.:208.8; IV Piggyback:500] Out: 925 [Urine:925] Intake/Output this shift:    PE: General:Pleasant affect, NAD Skin:Warm and dry, brisk capillary refill HEENT:normocephalic, sclera clear, mucus membranes moist Heart:irreg irreg  without murmur, gallup, rub or click Lungs:clear, ant.  without rales, rhonchi, or wheezes FTD:DUKG, non tender, + BS, do not palpate liver spleen or masses Ext:no lower ext edema,  2+ radial pulses Neuro:alert and oriented, MAE, follows commands, + facial symmetry Tele: a fib improved rate control but average in upper 90s to 120     Lab Results: No results for input(s): WBC, HGB, HCT, PLT in the last 72 hours. BMET  Recent Labs  11/18/14 0430 11/19/14 0432  NA 139 139  K 2.4* 3.9  CL 67* 72*  CO2 >50* >50*  GLUCOSE 109* 128*  BUN 32* 42*  CREATININE 0.58 0.67  CALCIUM 9.0 9.0   No results for input(s): TROPONINI in the last 72 hours.  Invalid input(s): CK, MB  No results found for: CHOL, HDL, LDLCALC, LDLDIRECT, TRIG, CHOLHDL No results found for: HGBA1C   Lab Results  Component Value Date   TSH 0.834 11/14/2014       Studies/Results: No results found.  Medications: I have reviewed the patient's current medications. Scheduled Meds: . antiseptic oral rinse  7 mL Mouth Rinse BID  . arformoterol  15 mcg Nebulization BID  . budesonide  0.25 mg Nebulization BID  . feeding supplement (ENSURE ENLIVE)  237 mL Oral BID BM  . furosemide  40 mg Oral BID  . polyethylene glycol  17 g Oral BID  . potassium chloride  40 mEq Oral Once  . predniSONE  40 mg Oral Q breakfast  . rivaroxaban  20 mg Oral Daily  . sodium chloride  10-40 mL Intracatheter Q12H   Continuous Infusions:  PRN Meds:.albuterol, ipratropium-albuterol, metoprolol, sodium chloride  Assessment/Plan: Principal Problem:   Acute diastolic CHF (congestive heart failure) Active Problems:   Atrial fibrillation   COPD exacerbation   Respiratory failure   Acute on chronic respiratory failure   Congestive heart disease   Pleural effusion   S/P thoracentesis   Hypernatremia  1. Acute on Chronic Diastolic CHF: admit BNP 254.2  Down to  225. 2D echo 5/29 showed normal LVF with EF at 60-65%. IV lasix stopped now on po. Good diuretic response. I/Os net negative 9.8 L out since admit. Renal function is normal with SCr at 0.67 today.  Continue I&O - Dr. Irish Lack to see.  Will follow up with Dr. Meda Coffee her primary cardiologist as outpt. Would repeat CXR.  2. Chronic Atrial Fibrillation: rate is overall elevated at upper 90s to 120.  Continue  Xarelto for a/c. Continue on telemetry.  Was on lanoxin and cardizem as outpt would resume cardizem   3. Hypokalemia: K was 2.4 now today 3.9  Supplement with K-dur. Was given Kdur 40 meq every 4 hours for 6 X a day.now that she is on po lasix she is on Kdur once daily.    4. Hypernatremia: Na improving. Down from 150-->148.--> 138--> 139.    LOS: 11 days   LOS: 12 days   Time spent with pt. :15 minutes. [facility] R  Nurse Practitioner Certified Pager [phone] or after 5pm and on weekends call  [phone] 11/19/2014, 7:55 AM   I have examined the patient and reviewed assessment and plan and discussed with patient.  Agree with above as stated.  Tolerating PO Lasix.  Watch renal function and potassium.  Tyreesha Maharaj S.

## 2014-11-19 NOTE — Progress Notes (Signed)
PT Cancellation Note  Patient Details Name: Jennifer Nielsen MRN: [id number] DOB: Feb 06, 1925   Cancelled Treatment:     unable to arouse pt but only briefly.     Rica Koyanagi  PTA [facility]  Acute  Rehab Pager      [phone]

## 2014-11-19 NOTE — Care Management Note (Signed)
Case Management Note  Patient Details  Name: Jennifer Nielsen MRN: [id number] Date of Birth: 1924-07-16  Subjective/Objective:                    Action/Plan:d/c plan return snf in am if medically stable.   Expected Discharge Date:   (unknown)               Expected Discharge Plan:  [place]  In-House Referral:     Discharge planning Services  CM Consult  Post Acute Care Choice:    Choice offered to:     DME Arranged:    DME Agency:     HH Arranged:    HH Agency:     Status of Service:  In process, will continue to follow  Medicare Important Message Given:    Date Medicare IM Given:    Medicare IM give by:    Date Additional Medicare IM Given:    Additional Medicare Important Message give by:     If discussed at [place] of Stay Meetings, dates discussed: 11/19/14   Additional Comments:  Dessa Phi, RN 11/19/2014, 12:20 PM

## 2014-11-20 DIAGNOSIS — F39 Unspecified mood [affective] disorder: Secondary | ICD-10-CM | POA: Diagnosis not present

## 2014-11-20 DIAGNOSIS — I4891 Unspecified atrial fibrillation: Secondary | ICD-10-CM | POA: Diagnosis not present

## 2014-11-20 DIAGNOSIS — E876 Hypokalemia: Secondary | ICD-10-CM | POA: Diagnosis not present

## 2014-11-20 DIAGNOSIS — I509 Heart failure, unspecified: Secondary | ICD-10-CM | POA: Diagnosis not present

## 2014-11-20 DIAGNOSIS — I5031 Acute diastolic (congestive) heart failure: Secondary | ICD-10-CM | POA: Diagnosis not present

## 2014-11-20 DIAGNOSIS — D638 Anemia in other chronic diseases classified elsewhere: Secondary | ICD-10-CM | POA: Diagnosis not present

## 2014-11-20 DIAGNOSIS — M6281 Muscle weakness (generalized): Secondary | ICD-10-CM | POA: Diagnosis not present

## 2014-11-20 DIAGNOSIS — J9621 Acute and chronic respiratory failure with hypoxia: Secondary | ICD-10-CM | POA: Diagnosis not present

## 2014-11-20 DIAGNOSIS — D649 Anemia, unspecified: Secondary | ICD-10-CM | POA: Diagnosis not present

## 2014-11-20 DIAGNOSIS — F29 Unspecified psychosis not due to a substance or known physiological condition: Secondary | ICD-10-CM | POA: Diagnosis not present

## 2014-11-20 DIAGNOSIS — Z5189 Encounter for other specified aftercare: Secondary | ICD-10-CM | POA: Diagnosis not present

## 2014-11-20 DIAGNOSIS — E870 Hyperosmolality and hypernatremia: Secondary | ICD-10-CM | POA: Diagnosis not present

## 2014-11-20 DIAGNOSIS — R278 Other lack of coordination: Secondary | ICD-10-CM | POA: Diagnosis not present

## 2014-11-20 DIAGNOSIS — I5032 Chronic diastolic (congestive) heart failure: Secondary | ICD-10-CM | POA: Diagnosis not present

## 2014-11-20 DIAGNOSIS — I482 Chronic atrial fibrillation: Secondary | ICD-10-CM | POA: Diagnosis not present

## 2014-11-20 DIAGNOSIS — J90 Pleural effusion, not elsewhere classified: Secondary | ICD-10-CM | POA: Diagnosis not present

## 2014-11-20 DIAGNOSIS — F0391 Unspecified dementia with behavioral disturbance: Secondary | ICD-10-CM | POA: Diagnosis not present

## 2014-11-20 DIAGNOSIS — F0390 Unspecified dementia without behavioral disturbance: Secondary | ICD-10-CM | POA: Diagnosis not present

## 2014-11-20 DIAGNOSIS — N390 Urinary tract infection, site not specified: Secondary | ICD-10-CM | POA: Diagnosis not present

## 2014-11-20 DIAGNOSIS — E46 Unspecified protein-calorie malnutrition: Secondary | ICD-10-CM | POA: Diagnosis not present

## 2014-11-20 DIAGNOSIS — K5900 Constipation, unspecified: Secondary | ICD-10-CM | POA: Diagnosis not present

## 2014-11-20 DIAGNOSIS — J441 Chronic obstructive pulmonary disease with (acute) exacerbation: Secondary | ICD-10-CM | POA: Diagnosis not present

## 2014-11-20 DIAGNOSIS — I5033 Acute on chronic diastolic (congestive) heart failure: Secondary | ICD-10-CM | POA: Diagnosis not present

## 2014-11-20 DIAGNOSIS — R5381 Other malaise: Secondary | ICD-10-CM | POA: Diagnosis not present

## 2014-11-20 DIAGNOSIS — J189 Pneumonia, unspecified organism: Secondary | ICD-10-CM | POA: Diagnosis not present

## 2014-11-20 LAB — BASIC METABOLIC PANEL
Anion gap: 13 (ref 5–15)
BUN: 45 mg/dL — ABNORMAL HIGH (ref 6–20)
CALCIUM: 8.9 mg/dL (ref 8.9–10.3)
CHLORIDE: 78 mmol/L — AB (ref 101–111)
CO2: 49 mmol/L — ABNORMAL HIGH (ref 22–32)
Creatinine, Ser: 0.61 mg/dL (ref 0.44–1.00)
GFR calc Af Amer: >60 mL/min (ref >60)
GFR calc non Af Amer: >60 mL/min (ref >60)
Glucose, Bld: 151 mg/dL — ABNORMAL HIGH (ref 65–99)
POTASSIUM: 3.1 mmol/L — AB (ref 3.5–5.1)
SODIUM: 140 mmol/L (ref 135–145)

## 2014-11-20 MED ORDER — DILTIAZEM HCL ER COATED BEADS 180 MG PO CP24: 180.0000 mg | ORAL_CAPSULE | Freq: Every day | ORAL | Status: AC

## 2014-11-20 MED ORDER — POTASSIUM CHLORIDE CRYS ER 20 MEQ PO TBCR
40.0000 meq | EXTENDED_RELEASE_TABLET | Freq: Once | ORAL | Status: AC
  Administered 2014-11-20: 40 meq via ORAL
  Filled 2014-11-20: qty 2

## 2014-11-20 MED ORDER — ENSURE ENLIVE PO LIQD: 237.0000 mL | Freq: Two times a day (BID) | ORAL | Status: DC

## 2014-11-20 MED ORDER — PREDNISONE 10 MG PO TABS: 20.0000 mg | ORAL_TABLET | ORAL | Status: DC

## 2014-11-20 NOTE — Discharge Summary (Signed)
Physician Discharge Summary  Jennifer Nielsen YQM:[id number] DOB: 15-May-1925 DOA: 11/07/2014  PCP: Jennifer Pardon, MD  Admit date: 11/07/2014 Discharge date: 11/20/2014  Time spent: > 35 minutes  Recommendations for Outpatient Follow-up:  1. Please monitor potassium levels on follow up  Discharge Diagnoses:  Principal Problem:   Acute diastolic CHF (congestive heart failure) Active Problems:   Atrial fibrillation   COPD exacerbation   Respiratory failure   Acute on chronic respiratory failure   Congestive heart disease   Pleural effusion   S/P thoracentesis   Hypernatremia   Discharge Condition: stable  Diet recommendation: Heart healthy, low sodium diet  Filed Weights   11/18/14 0417 11/19/14 0546 11/20/14 0426  Weight: 80.468 kg (177 lb 6.4 oz) 76.93 kg (169 lb 9.6 oz) 77.47 kg (170 lb 12.7 oz)    History of present illness:  81 y.o. female  With h/o DVT, chronic afib on xarelto, h/o diastolic chf/copd/chronic respiratory failure on home oxygen 2liter, h/o dementia was sent from SNF to [facility] long ED via EMS due to unresponsive and labored breathing.  Hospital Course:  1. Acute on chronic diastolic congestive heart failure. Appears compensated on day of d/c. Now on oral Lasix. 2. Acute on chronic hypoxic respiratory failure secondary to acute heart failure as above. Acute on chronic hypercapnic respiratory failure secondary to same versus COPD. Stable. On baseline 2 L nasal cannula. Will provide script for prednisone taper (short duration) 3. Sepsis, HCAP on admission. Resolved. Treated with antibiotics. 4. Bilateral pleural effusions. Status post thoracentesis 2. Pleural fluid culture no growth. 5. Acute kidney injury. Resolved. 6. COPD exacerbation. Resolved. 7. Demand ischemia. Secondary to CHF and sepsis.  8. Chronic hypoxic respiratory failure. Remains stable on day of d/c. 9. Chronic atrial fibrillation. Stable on Xarelto and diltiazem will continue on day of d/c,  diltiazem dose increased and digoxin d/c'd.  10. Hypernatremia. Secondary to poor oral intake. Resolved. 11. Hypokalemia: most likely due to lasix. Will provide extra dose of K dur 40 meq on day of d/c x 1 12. Dementia. Stable. 13. Macrocytic anemia secondary to chronic disease   Consultations:  Cardiology  Discharge Exam: Filed Vitals:   11/20/14 0426  BP: 131/61  Pulse: 100  Temp: 98.1 F (36.7 C)  Resp: 18    General: Pt in nad, alert and awake Cardiovascular: s1 and s2 wnl, no rubs Respiratory: cta bl, no wheezes, [place] in place, equal chest rise  Discharge Instructions   Discharge Instructions    Call MD for:  difficulty breathing, headache or visual disturbances    Complete by:  As directed      Call MD for:  persistant dizziness or light-headedness    Complete by:  As directed      Call MD for:  temperature >100.4    Complete by:  As directed      Diet - low sodium heart healthy    Complete by:  As directed      Increase activity slowly    Complete by:  As directed           Current Discharge Medication List    START taking these medications   Details  predniSONE (DELTASONE) 10 MG tablet Take 2 tablets (20 mg total) by mouth as directed. Take 2 tablets by mouth for the next 2 days. Then take 1 table by mouth for the next 2 days, then take 0.5 tablets by mouth for the next 2 days. Qty: 7 tablet, Refills: 0  CONTINUE these medications which have CHANGED   Details  diltiazem (CARDIZEM CD) 180 MG 24 hr capsule Take 1 capsule (180 mg total) by mouth daily. For A-Fib Qty: 30 capsule, Refills: 0    feeding supplement, ENSURE ENLIVE, (ENSURE ENLIVE) LIQD Take 237 mLs by mouth 2 (two) times daily between meals. Qty: 237 mL, Refills: 12      CONTINUE these medications which have NOT CHANGED   Details  acetaminophen (TYLENOL) 500 MG tablet Take 500 mg by mouth 3 (three) times daily.     budesonide-formoterol (SYMBICORT) 160-4.5 MCG/ACT inhaler Inhale 2  puffs into the lungs 2 (two) times daily.    Calcium Carbonate-Vitamin D (CALTRATE 600+D PO) Take 1 tablet by mouth every morning. For calcium supplement    Docusate Sodium (DSS) 100 MG CAPS Take 100 mg by mouth daily. For constipation    furosemide (LASIX) 40 MG tablet Take 40 mg by mouth 2 (two) times daily.    ipratropium-albuterol (DUONEB) 0.5-2.5 (3) MG/3ML SOLN 16ml nebulized 4 times daily scheduled and every 4hrs as needed for wheezing for 3 days. And then every 4hours as needed for wheezing. Qty: 360 mL    Multiple Vitamins-Minerals (DECUBI-VITE) CAPS Take 1 capsule by mouth daily.    Polyethyl Glycol-Propyl Glycol (SYSTANE OP) Place 1 drop into both eyes 2 (two) times daily. For dry eyes    potassium chloride SA (K-DUR,KLOR-CON) 20 MEQ tablet Take 1 tablet (20 mEq total) by mouth daily. Qty: 10 tablet, Refills: 0    Rivaroxaban (XARELTO) 20 MG TABS tablet Take 20 mg by mouth daily. For DVT      STOP taking these medications     digoxin (LANOXIN) 0.25 MG tablet      guaiFENesin (MUCINEX) 600 MG 12 hr tablet      Protein (PROCEL) POWD      azithromycin (ZITHROMAX) 250 MG tablet      digoxin (LANOXIN) 0.125 MG tablet        Allergies  Allergen Reactions  . Chocolate Itching  . Ciprofloxacin Itching  . Coffee Bean Extract [Coffea Arabica] Itching  . Coffee Flavor Itching  . Eggs Or Egg-Derived Products   . Gentamycin [Gentamicin] Itching  . Imodium [Loperamide] Other (See Comments)    unknown  . Penicillins Itching  . Sulfa Antibiotics Itching  . Tea Itching      The results of significant diagnostics from this hospitalization (including imaging, microbiology, ancillary and laboratory) are listed below for reference.    Significant Diagnostic Studies: Dg Chest 2 View  11/07/2014   CLINICAL DATA:  Shortness breath. Under ext bouts of with labored breathing. Transfer from skilled nursing facility.  EXAM: CHEST - 2 VIEW  COMPARISON:  CT of the chest 10/12/2014  and chest x-ray 10/11/2014.  FINDINGS: Heart is enlarged. Bilateral pleural effusions have increased, left greater than right. Mild edema is noted. Bibasilar airspace opacities are present. Degenerative changes are again noted in both shoulders.  IMPRESSION: 1. Increased interstitial edema and bilateral pleural effusions compatible with congestive heart failure. 2. Bibasilar airspace disease likely reflects atelectasis.   Electronically Signed   By: San Morelle M.D.   On: 11/07/2014 10:46   Ct Chest W Contrast  11/12/2014   CLINICAL DATA:  Evaluate pleural effusion. Status post left-sided thoracentesis on 11/11/2014, with removal 150 cc of pleural fluid. History of COPD, CHF, and respiratory failure.  EXAM: CT CHEST WITH CONTRAST  TECHNIQUE: Multidetector CT imaging of the chest was performed during intravenous contrast administration.  CONTRAST:  64mL OMNIPAQUE IOHEXOL 300 MG/ML  SOLN  COMPARISON:  Chest radiographs 11/12/2014, 11/11/2014, 11/10/2014, 11/09/2014. CT chest 10/11/2013. CT abdomen 11/02/2010.  FINDINGS: There are symmetric, moderate-to-large, simple pleural effusions bilaterally. No evidence of pleural enhancement or nodularity.  Moderate stable cardiomegaly with dilatation of the bilateral atria. Coronary artery atherosclerotic calcification noted. Negative for pericardial effusion.  Thoracic aorta is normal in caliber and contains scattered atherosclerotic calcification. Proximal great vessels are unremarkable. Atherosclerotic calcification is seen in the right carotid artery at the base the neck.  There is rightward deviation of the esophagus by the enlarged left atrium. Esophagus otherwise unremarkable.  Negative for lymphadenopathy.  There is respiratory motion on the lung windows. There is compressive atelectasis of both lower lobes due to the overlying pleural effusions. No airspace consolidation, pulmonary edema, or interstitial abnormality is appreciated. No nodule or mass is  seen, but sensitivity for detecting small nodules is decreased by patient respiratory motion.  The patient is kyphotic, and has chronic thoracic vertebral body compression fractures involving T5, T6, and T7. No new thoracic spine compression fractures compared to the CT of 10/12/2014.  Chronic right adrenal gland nodules are unchanged compared to abdominal CT of 2012. These are likely benign adenomas.  IMPRESSION: 1. Bilateral and symmetric simple pleural effusions, moderate-to-large in size. 2. Negative for empyema. 3. Significant compressive atelectasis of the lower lobes secondary to the overlying pleural effusions. 4. Respiratory motion limits detail of the lung parenchyma. No definite pulmonary edema. 5. Cardiomegaly and coronary artery atherosclerotic calcification. 6. Stable right adrenal gland nodularity dating back to 2012. These likely reflect benign adenomas. 7. Chronic T5, T6, and T7 compression fractures.   Electronically Signed   By: Curlene Dolphin M.D.   On: 11/12/2014 11:00   Dg Chest Port 1 View  11/14/2014   CLINICAL DATA:  PICC line placement.  EXAM: PORTABLE CHEST - 1 VIEW  COMPARISON:  11/12/2014 and 11/11/2014.  FINDINGS: 1648 hours. Patient is rotated to the right. There is a new right arm PICC projected inferiorly over the SVC right atrial junction. No pneumothorax. Cardiomegaly, bilateral pleural effusions and bibasilar pulmonary opacities are unchanged. There are glenohumeral degenerative changes bilaterally.  IMPRESSION: Right arm PICC placement as described without evidence of pneumothorax. No significant change in bilateral pleural effusions and bibasilar pulmonary opacities.   Electronically Signed   By: Richardean Sale M.D.   On: 11/14/2014 17:12   Dg Chest Port 1 View  11/12/2014   CLINICAL DATA:  Post LEFT thoracentesis  EXAM: PORTABLE CHEST - 1 VIEW  COMPARISON:  Portable exam 1522 hours compared to earlier study of 11/12/2014  FINDINGS: Rotated to the RIGHT.  Enlargement of  cardiac silhouette.  Mediastinal contours and pulmonary vascularity normal.  Atherosclerotic calcification aorta.  RIGHT pleural effusion and bibasilar atelectasis.  Decreased LEFT pleural effusion since previous exam.  No pneumothorax.  Osseous demineralization with advanced degenerative changes of the RIGHT glenohumeral joint.  IMPRESSION: No pneumothorax post LEFT thoracentesis.  LEFT basilar atelectasis with persistent effusion atelectasis at RIGHT base.   Electronically Signed   By: [name] M.D.   On: 11/12/2014 15:28   Dg Chest Port 1 View  11/12/2014   CLINICAL DATA:  Systolic congestive heart failure.  EXAM: PORTABLE CHEST - 1 VIEW  COMPARISON:  11/11/2014 and 11/10/2014.  FINDINGS: 0456 hr. Patient remains rotated to the right. Cardiomegaly and aortic ectasia appear unchanged. There are stable left-greater-than-right pleural effusions with associated basilar airspace opacities. The pulmonary vasculature remain somewhat  indistinct. There is no pneumothorax. Asymmetric glenohumeral degenerative changes are again noted on the right.  IMPRESSION: No significant change in pleural effusions, bibasilar airspace opacities and mild interstitial edema.   Electronically Signed   By: Richardean Sale M.D.   On: 11/12/2014 07:17   Dg Chest Port 1 View  11/11/2014   CLINICAL DATA:  Pleural effusion.  EXAM: PORTABLE CHEST - 1 VIEW  COMPARISON:  Single view of the chest 11/10/2014.  FINDINGS: Left worse than right pleural effusions and basilar airspace disease are again seen. No pneumothorax identified. Heart size appears enlarged. Mild appearing interstitial edema is again seen.  IMPRESSION: Left worse than right pleural effusions and basilar airspace disease.  Cardiomegaly and mild appearing interstitial edema.   Electronically Signed   By: Inge Rise M.D.   On: 11/11/2014 12:40   Dg Chest Port 1 View  11/10/2014   CLINICAL DATA:  Pt diagnosed with CHF, MD concerned about how her lungs are looking today  as compared to prior x-rayPt was on breathing tx during exam, seemed SOBHx COPD, a-fib  EXAM: PORTABLE CHEST - 1 VIEW  COMPARISON:  11/09/2014  FINDINGS: There are left greater than right pleural effusions. There is vascular congestion as well as hazy airspace opacity that predominate centrally. Cardiac silhouette is mildly enlarged and incompletely visualized.  Rib fractures noted on the right are again noted, chronicity and clear. Bony thorax is extensively demineralized. Advanced arthropathic changes of the right shoulder are noted.  IMPRESSION: 1. No significant change from the previous day's study. 2. Stable pleural effusions and pulmonary edema, like the chest or failure   Electronically Signed   By: Lajean Manes M.D.   On: 11/10/2014 09:34   Dg Chest Port 1 View  11/09/2014   CLINICAL DATA:  Shortness of breath.  EXAM: PORTABLE CHEST - 1 VIEW  COMPARISON:  11/07/2014  FINDINGS: The cardiac enlargement is noted. There are moderate to large bilateral pleural effusions noted left greater night. This is similar in volume to previous exam. Increase in interstitial edema. Chronic right posterior rib fracture deformities are again noted.  IMPRESSION: 1. No change in bilateral pleural effusions left greater night. 2. Increase in pulmonary edema.   Electronically Signed   By: Kerby Moors M.D.   On: 11/09/2014 13:58   US Thoracentesis Asp Pleural Space W/img Guide  11/12/2014   INDICATION: Acute on chronic respiratory failure, pneumonia, acute renal failure, bilateral pleural effusions. Request is made for therapeutic left thoracentesis .  EXAM: ULTRASOUND GUIDED THERAPEUTIC LEFT THORACENTESIS  COMPARISON:  Prior thoracentesis on 11/11/2014  MEDICATIONS: None  COMPLICATIONS: None immediate  TECHNIQUE: Informed written consent was obtained from the patient after a discussion of the risks, benefits and alternatives to treatment. A timeout was performed prior to the initiation of the procedure.  Initial  ultrasound scanning demonstrates a moderate left pleural effusion. The lower chest was prepped and draped in the usual sterile fashion. 1% lidocaine was used for local anesthesia.  An ultrasound image was saved for documentation purposes. A 6 Fr Safe-T-Centesis catheter was introduced. The thoracentesis was performed. The catheter was removed and a dressing was applied. The patient tolerated the procedure well without immediate post procedural complication. The patient was escorted to have an upright chest radiograph.  FINDINGS: A total of approximately 650 cc's of slightly hazy, yellow fluid was removed.  IMPRESSION: Successful ultrasound-guided therapeutic left sided thoracentesis yielding 650 cc's of pleural fluid.  Read by: Rowe Robert, PA-C   Electronically Signed  By: Lucrezia Europe M.D.   On: 11/12/2014 15:27   US Thoracentesis Asp Pleural Space W/img Guide  11/11/2014   INDICATION: Symptomatic L sided pleural effusion  EXAM: PORTABLE US THORACENTESIS ASP PLEURAL SPACE W/IMG GUIDE  COMPARISON:  None.  MEDICATIONS: 10 cc 1% lidocaine  COMPLICATIONS: None immediate  TECHNIQUE: Informed written consent was obtained from the patient after a discussion of the risks, benefits and alternatives to treatment. A timeout was performed prior to the initiation of the procedure.  Initial ultrasound scanning demonstrates a left pleural effusion. The lower chest was prepped and draped in the usual sterile fashion. 1% lidocaine was used for local anesthesia.  Under direct ultrasound guidance, a 19 gauge, 7-cm, Yueh catheter was introduced. An ultrasound image was saved for documentation purposes. The thoracentesis was performed. The catheter was removed and a dressing was applied. The patient tolerated the procedure well without immediate post procedural complication. The patient was escorted to have an upright chest radiograph.  FINDINGS: A total of approximately 150 cc of serous fluid was removed. Requested samples were  sent to the laboratory.  IMPRESSION: Successful ultrasound-guided L sided thoracentesis yielding 150 cc of pleural fluid.  Read by:  Lavonia Drafts [facility]   Electronically Signed   By: Jerilynn Mages.  Shick M.D.   On: 11/11/2014 11:41    Microbiology: Recent Results (from the past 240 hour(s))  Body fluid culture     Status: None   Collection Time: 11/11/14 11:35 AM  Result Value Ref Range Status   Specimen Description PLEURAL LEFT  Final   Special Requests NONE  Final   Gram Stain   Final    FEW WBC PRESENT,BOTH PMN AND MONONUCLEAR NO ORGANISMS SEEN Performed at [facility]    Culture   Final    NO GROWTH 3 DAYS Performed at [facility]    Report Status 11/15/2014 FINAL  Final     Labs: Basic Metabolic Panel:  Recent Labs Lab 11/16/14 0455 11/17/14 0400 11/18/14 0430 11/19/14 0432 11/20/14 0751  NA 137 135 139 139 140  K 3.2* 3.0* 2.4* 3.9 3.1*  CL 70* 68* 67* 72* 78*  CO2 >50* >50* >50* >50* 49*  GLUCOSE 100* 135* 109* 128* 151*  BUN 30* 34* 32* 42* 45*  CREATININE 0.48 0.44 0.58 0.67 0.61  CALCIUM 8.5* 8.4* 9.0 9.0 8.9  MG 2.0  --  2.1  --   --    Liver Function Tests: No results for input(s): AST, ALT, ALKPHOS, BILITOT, PROT, ALBUMIN in the last 168 hours. No results for input(s): LIPASE, AMYLASE in the last 168 hours. No results for input(s): AMMONIA in the last 168 hours. CBC: No results for input(s): WBC, NEUTROABS, HGB, HCT, MCV, PLT in the last 168 hours. Cardiac Enzymes: No results for input(s): CKTOTAL, CKMB, CKMBINDEX, TROPONINI in the last 168 hours. BNP: BNP (last 3 results)  Recent Labs  10/09/14 1753 11/07/14 0845 11/16/14 0455  BNP 309.6* 785.7* 225.8*    ProBNP (last 3 results)  Recent Labs  02/22/14 1417 06/17/14 1050  PROBNP 2244.0* 170.0*    CBG: No results for input(s): GLUCAP in the last 168 hours.     Signed:  Velvet Bathe  [facility] 11/20/2014, 10:24 AM

## 2014-11-20 NOTE — Progress Notes (Signed)
Patient is set to discharge back to [facility] today. Patient & daughter, [name] aware. Discharge packet given to RN, Susie. [facility] called for transport to pickup at 1:00pm.    Raynaldo Opitz, [place] Worker cell #: [phone]

## 2014-11-20 NOTE — Progress Notes (Signed)
Subjective:  81 y/o female with h/o chronic diastolic HF, chronic atrial fibrillation on Xarelto, admitted for acute respiratory failure, in the setting of sepsis with HCAP, acute diastolic CHF, and bilateral pleural effusions. S/p lt thoracentesis 650 cc  Objective: Vital signs in last 24 hours: Temp:  [98.1 F (36.7 C)-98.6 F (37 C)] 98.1 F (36.7 C) (06/08 0426) Pulse Rate:  [87-100] 100 (06/08 0426) Resp:  [18-20] 18 (06/08 0426) BP: (128-146)/(61-75) 131/61 mmHg (06/08 0426) SpO2:  [96 %-100 %] 97 % (06/08 0928) Weight:  [170 lb 12.7 oz (77.47 kg)] 170 lb 12.7 oz (77.47 kg) (06/08 0426) Weight change: 1 lb 3.1 oz (0.54 kg) Last BM Date: 11/13/14 Intake/Output from previous day: 06/07 0701 - 06/08 0700 In: 80 [P.O.:60; I.V.:20] Out: 800 [Urine:800] Intake/Output this shift: Total I/O In: 360 [P.O.:360] Out: -   PE: General:Pleasant affect, NAD Skin:Warm and dry, brisk capillary refill HEENT:normocephalic, sclera clear, mucus membranes moist Heart:irreg irreg without murmur, gallup, rub or click Lungs:clear, ant. without rales, rhonchi, or wheezes SAY:TKZS, non tender, + BS, do not palpate liver spleen or masses Ext:no lower ext edema, 2+ radial pulses Neuro:alert and oriented, MAE, follows commands, + facial symmetry Tele: a fib improved rate control but average in upper 80s to 90s with occ PVC  Lab Results: No results for input(s): WBC, HGB, HCT, PLT in the last 72 hours. BMET  Recent Labs  11/19/14 0432 11/20/14 0751  NA 139 140  K 3.9 3.1*  CL 72* 78*  CO2 >50* 49*  GLUCOSE 128* 151*  BUN 42* 45*  CREATININE 0.67 0.61  CALCIUM 9.0 8.9   No results for input(s): TROPONINI in the last 72 hours.  Invalid input(s): CK, MB  No results found for: CHOL, HDL, LDLCALC, LDLDIRECT, TRIG, CHOLHDL No results found for: HGBA1C   Lab Results  Component Value Date   TSH 0.834 11/14/2014    Hepatic Function Panel No results for input(s): PROT,  ALBUMIN, AST, ALT, ALKPHOS, BILITOT, BILIDIR, IBILI in the last 72 hours. No results for input(s): CHOL in the last 72 hours. No results for input(s): PROTIME in the last 72 hours.     Studies/Results: No results found.  Medications:  Scheduled Meds: . antiseptic oral rinse  7 mL Mouth Rinse BID  . arformoterol  15 mcg Nebulization BID  . budesonide  0.25 mg Nebulization BID  . diltiazem  180 mg Oral Daily  . feeding supplement (ENSURE ENLIVE)  237 mL Oral BID BM  . furosemide  40 mg Oral BID  . polyethylene glycol  17 g Oral BID  . potassium chloride  40 mEq Oral Once  . predniSONE  20 mg Oral Q breakfast  . rivaroxaban  20 mg Oral Daily  . senna  1 tablet Oral QHS  . sodium chloride  10-40 mL Intracatheter Q12H   Continuous Infusions:  PRN Meds:.albuterol, ipratropium-albuterol, metoprolol, sodium chloride   Assessment/Plan: Principal Problem:  Acute diastolic CHF (congestive heart failure) Active Problems:  Atrial fibrillation  COPD exacerbation  Respiratory failure  Acute on chronic respiratory failure  Congestive heart disease  Pleural effusion  S/P thoracentesis  Hypernatremia  1. Acute on Chronic Diastolic CHF: admit BNP 010.9 Down to 225. 2D echo 5/29 showed normal LVF with EF at 60-65%. IV lasix stopped now on po. Good diuretic response. I/Os net negative 10.6 L out since admit. Renal function is normal with SCr at 0.61 today. Continue I&O - Dr. Irish Lack  to see. Will follow up with Dr. Meda Coffee her primary cardiologist as outpt. Would repeat CXR.  2. Chronic Atrial Fibrillation: rate is overall elevated yesterday at upper 90s to 120. now 80's to 90's today with occ. PVC. Continue Xarelto for a/c. Continue on telemetry. Was on lanoxin and cardizem as outpt resumed cardizem yesterday with improved results.  3. Hypokalemia: K was 2.4 now today 3.1. Supplement with K-dur. [facility] is replacing.   4. Hypernatremia: Na improving. Down from  150-->148.--> 138--> 139-->140  Plans for discharge back to facility today.    LOS: 13 days   Time spent with pt. : 15 minutes. Jennifer Nielsen      I have seen the pt along with Ms. Hammond, NP-Student and have examined the pt. independently We discussed exam and plan.  I agree with orders and plan.    Jennifer Kicks, FNP-C [facility].   Nurse Practitioner Certified Pager [phone] or after 5pm and on weekends call [phone] 11/20/2014, 11:09 AM   I have examined the patient and reviewed assessment and plan and discussed with patient.  Agree with above as stated.  Patient appears stable from a respiratory status. She is lying fairly flat with just nasal cannula supplemental oxygen. She is tolerating oral Lasix. Cardiology follow-up with Dr. Meda Coffee. They can consider repeat chest x-ray at that time. Continue supplementing potassium is well given her ongoing dialytic therapy.  Jennifer Nielsen S.

## 2014-11-21 ENCOUNTER — Non-Acute Institutional Stay (SKILLED_NURSING_FACILITY): Payer: Medicare Other | Admitting: Adult Health

## 2014-11-21 DIAGNOSIS — R5381 Other malaise: Secondary | ICD-10-CM

## 2014-11-21 DIAGNOSIS — J441 Chronic obstructive pulmonary disease with (acute) exacerbation: Secondary | ICD-10-CM | POA: Diagnosis not present

## 2014-11-21 DIAGNOSIS — E46 Unspecified protein-calorie malnutrition: Secondary | ICD-10-CM

## 2014-11-21 DIAGNOSIS — I482 Chronic atrial fibrillation, unspecified: Secondary | ICD-10-CM

## 2014-11-21 DIAGNOSIS — F0391 Unspecified dementia with behavioral disturbance: Secondary | ICD-10-CM | POA: Diagnosis not present

## 2014-11-21 DIAGNOSIS — E876 Hypokalemia: Secondary | ICD-10-CM

## 2014-11-21 DIAGNOSIS — J90 Pleural effusion, not elsewhere classified: Secondary | ICD-10-CM | POA: Diagnosis not present

## 2014-11-21 DIAGNOSIS — I5032 Chronic diastolic (congestive) heart failure: Secondary | ICD-10-CM

## 2014-11-21 DIAGNOSIS — F29 Unspecified psychosis not due to a substance or known physiological condition: Secondary | ICD-10-CM

## 2014-11-22 ENCOUNTER — Encounter: Payer: Self-pay | Admitting: Internal Medicine

## 2014-11-22 ENCOUNTER — Encounter: Payer: Self-pay | Admitting: Adult Health

## 2014-11-22 ENCOUNTER — Non-Acute Institutional Stay (SKILLED_NURSING_FACILITY): Payer: Medicare Other | Admitting: Internal Medicine

## 2014-11-22 DIAGNOSIS — I5033 Acute on chronic diastolic (congestive) heart failure: Secondary | ICD-10-CM

## 2014-11-22 DIAGNOSIS — J9621 Acute and chronic respiratory failure with hypoxia: Secondary | ICD-10-CM | POA: Diagnosis not present

## 2014-11-22 DIAGNOSIS — I482 Chronic atrial fibrillation, unspecified: Secondary | ICD-10-CM

## 2014-11-22 DIAGNOSIS — J441 Chronic obstructive pulmonary disease with (acute) exacerbation: Secondary | ICD-10-CM

## 2014-11-22 DIAGNOSIS — J189 Pneumonia, unspecified organism: Secondary | ICD-10-CM | POA: Diagnosis not present

## 2014-11-22 DIAGNOSIS — K5900 Constipation, unspecified: Secondary | ICD-10-CM | POA: Diagnosis not present

## 2014-11-22 DIAGNOSIS — J90 Pleural effusion, not elsewhere classified: Secondary | ICD-10-CM | POA: Diagnosis not present

## 2014-11-22 DIAGNOSIS — E46 Unspecified protein-calorie malnutrition: Secondary | ICD-10-CM

## 2014-11-22 NOTE — Progress Notes (Signed)
Patient ID: Jennifer Nielsen, female   DOB: 12-13-1924, 81 y.o.   MRN: [id number]     [place]  PCP: [name], MD  Code Status: DNR  Allergies  Allergen Reactions  . Chocolate Itching  . Ciprofloxacin Itching  . Coffee Bean Extract [Coffea Arabica] Itching  . Coffee Flavor Itching  . Eggs Or Egg-Derived Products   . Gentamycin [Gentamicin] Itching  . Imodium [Loperamide] Other (See Comments)    unknown  . Penicillins Itching  . Sulfa Antibiotics Itching  . Tea Itching    Chief Complaint  Patient presents with  . Readmit To SNF    readmission     HPI:  81 year old patient is here for long term care post hospital admission from5/26/16-11/20/14 with acute on chronic respiratory failure from CHF exacerbation and HCAP. She responded well to diuresis and antibiotics alongwith thoracocentesis and drainage of pleural fluid. She also had demand ischemia and acute kidney injury. She has had several admissions for the same reason. She has PMH of COPD, chronic atrial fibrillation on anticoagulation and diastolic CHF.  She is seen in her room today. She is pleasantly confused. Appears in no distress. Denies any concerns.   Review of Systems:  Constitutional: Negative for fever, chills, diaphoresis. positive for fatigue HENT: Negative for headache, congestion Eyes: Negative for eye pain, blurred vision, double vision and discharge.  Respiratory: Negative for cough, shortness of breath and wheezing.   Cardiovascular: Negative for chest pain, palpitations.  Gastrointestinal: Negative for heartburn, nausea, vomiting, abdominal pain Genitourinary: Negative for dysuria Musculoskeletal: Negative for back pain, falls in facility. Positive for weakness Skin: Negative for itching, rash.  Neurological: Negative for dizziness, tingling Psychiatric/Behavioral: Negative for depression    Past Medical History  Diagnosis Date  . Arrhythmia   . Arthritis   . Atrial fibrillation     Remains stable (As of 05/09/13)  . H/O blood clots   . Hyperlipemia   . GERD (gastroesophageal reflux disease)     Stable (As of 05/09/13)  . Osteoarthritis     Denies pain (As of 05/09/13)  . CHF (congestive heart failure)     chronic diastolic  . COPD (chronic obstructive pulmonary disease)     w/exacerbation  . Dementia     Remains stable & continues to function adequately in the current living environment with supervision. Has had little changes in behavior (AS OF 05/09/13)  . Pleural effusion 11/12/2014  . S/P thoracentesis    Past Surgical History  Procedure Laterality Date  . Appendectomy    . Gallbladder surgery    . Hip surgery      Right-Metal plate   . Leg surgery      Left leg   Social History:   reports that she has quit smoking. She has never used smokeless tobacco. She reports that she does not drink alcohol or use illicit drugs.  Family History  Problem Relation Age of Onset  . Colon cancer Neg Hx   . Heart disease Father   . Breast cancer Daughter     Medications: Patient's Medications  New Prescriptions   ANTISEPTIC ORAL RINSE (CPC / CETYLPYRIDINIUM CHLORIDE 0.05%) 0.05 % LIQD SOLUTION    7 mLs by Mouth Rinse route 2 times daily at 12 noon and 4 pm.   FEEDING SUPPLEMENT, GLUCERNA SHAKE, (GLUCERNA SHAKE) LIQD    Take 237 mLs by mouth 3 (three) times daily between meals.  Previous Medications   ACETAMINOPHEN (TYLENOL) 500 MG TABLET  Take 500 mg by mouth 3 (three) times daily.    AMBULATORY NON FORMULARY MEDICATION    Medication Name: Med Pass, 120 mLs by mouth twice daily   BUDESONIDE-FORMOTEROL (SYMBICORT) 160-4.5 MCG/ACT INHALER    Inhale 2 puffs into the lungs 2 (two) times daily.   CALCIUM CARBONATE-VITAMIN D (CALTRATE 600+D PO)    Take 1 tablet by mouth every morning. For calcium supplement   DILTIAZEM (CARDIZEM CD) 180 MG 24 HR CAPSULE    Take 1 capsule (180 mg total) by mouth daily. For A-Fib   DOCUSATE SODIUM (DSS) 100 MG CAPS    Take 100 mg by  mouth daily. For constipation   FUROSEMIDE (LASIX) 40 MG TABLET    Take 40 mg by mouth 2 (two) times daily.   IPRATROPIUM-ALBUTEROL (DUONEB) 0.5-2.5 (3) MG/3ML SOLN    91ml nebulized 4 times daily scheduled and every 4hrs as needed for wheezing for 3 days. And then every 4hours as needed for wheezing.   MULTIPLE VITAMINS-MINERALS (DECUBI-VITE) CAPS    Take 1 capsule by mouth daily.   OXYGEN    Inhale 2 L into the lungs continuous.    POLYETHYL GLYCOL-PROPYL GLYCOL (SYSTANE OP)    Place 1 drop into both eyes 2 (two) times daily. For dry eyes   POTASSIUM CHLORIDE SA (K-DUR,KLOR-CON) 20 MEQ TABLET    Take 1 tablet (20 mEq total) by mouth daily.   RIVAROXABAN (XARELTO) 20 MG TABS TABLET    Take 20 mg by mouth daily. For DVT  Modified Medications   Modified Medication Previous Medication   OLANZAPINE (ZYPREXA) 5 MG TABLET OLANZapine (ZYPREXA) 5 MG tablet      Take 1 tablet (5 mg total) by mouth at bedtime.    Take 5 mg by mouth at bedtime.  Discontinued Medications   FEEDING SUPPLEMENT, ENSURE ENLIVE, (ENSURE ENLIVE) LIQD    Take 237 mLs by mouth 2 (two) times daily between meals.   PREDNISONE (DELTASONE) 10 MG TABLET    Take 2 tablets (20 mg total) by mouth as directed. Take 2 tablets by mouth for the next 2 days. Then take 1 table by mouth for the next 2 days, then take 0.5 tablets by mouth for the next 2 days.     Physical Exam Filed Vitals:   11/22/14 1036  BP: 126/76  Pulse: 82  Temp: 97.7 F (36.5 C)  TempSrc: Oral  Resp: 18  Height: 5\' 7"  (1.702 m)  Weight: 188 lb 3.2 oz (85.367 kg)  SpO2: 96%   General- elderly female, obese, chronically ill appearing, in no acute distress Head- normocephalic, atraumatic Throat- moist mucus membrane Eyes- no pallor, no icterus, no discharge, normal conjunctiva, normal sclera Neck- no cervical lymphadenopathy Cardiovascular- normal s1,s2, no murmurs, no leg edema Respiratory- bilateral poor inspiratory effort with decreased air entry, on o2, no  wheeze, no rhonchi, no crackles,  Abdomen- bowel sounds present, soft, non tender Musculoskeletal- generalized weakness more in lower extremities Neurological- alert but pleasantly confused Skin- warm and dry Psychiatry- calm with normal affect   Labs reviewed: Basic Metabolic Panel:  Recent Labs  11/13/14 0345  11/16/14 0455  11/18/14 0430  12/30/14 0413 12/31/14 0345 01/01/15 0753  NA 150*  < > 137  < > 139  < > 141 140 143  K 3.3*  < > 3.2*  < > 2.4*  < > 3.0* 4.1 3.8  CL 95*  < > 70*  < > 67*  < > 92* 93* 93*  CO2  44*  < > >50*  < > >50*  < > 39* 36* 44*  GLUCOSE 165*  < > 100*  < > 109*  < > 151* 154* 129*  BUN 33*  < > 30*  < > 32*  < > 37* 46* 49*  CREATININE 0.58  < > 0.48  < > 0.58  < > 0.74 0.78 0.72  CALCIUM 8.6*  < > 8.5*  < > 9.0  < > 8.3* 8.3* 8.4*  MG 2.1  --  2.0  --  2.1  --   --   --   --   < > = values in this interval not displayed. Liver Function Tests:  Recent Labs  11/08/14 0349 11/12/14 0345 12/28/14 1742  AST 29 27 32  ALT 22 25 34  ALKPHOS 65 59 92  BILITOT 0.8 0.4 0.9  PROT 6.2* 6.1* 7.7  ALBUMIN 2.6* 2.4* 3.5   No results for input(s): LIPASE, AMYLASE in the last 8760 hours.  Recent Labs  08/11/14 2251 08/19/14 0510  AMMONIA 22 27   CBC:  Recent Labs  10/09/14 1753  11/07/14 0845  12/28/14 1742 12/28/14 1852 12/29/14 0358 01/01/15 0753  WBC 11.4*  < > 20.0*  < > 16.2*  --  11.8* 11.2*  NEUTROABS 10.7*  --  17.0*  --  14.0*  --   --   --   HGB 11.1*  < > 12.1  < > 10.9* 12.6 9.8* 10.3*  HCT 36.6  < > 39.2  < > 37.4 37.0 33.1* 35.3*  MCV 99.7  < > 102.1*  < > 102.7*  --  102.5* 102.0*  PLT 266  < > 296  < > 441*  --  372 379  < > = values in this interval not displayed. Cardiac Enzymes:  Recent Labs  11/07/14 0845 11/07/14 1713 11/07/14 2302  TROPONINI 0.11* 0.08* 0.06*   BNP: Invalid input(s): POCBNP CBG:  Recent Labs  01/02/15 0426 01/02/15 0738 01/02/15 1246  GLUCAP 115* 109* 225*      Assessment/Plan  Acute on chronic respiratory failure   With pleural effusion, pneumonia, copd and chf. Clinically improved at present, has overall poor prognosis with recurrent exacerbation and hospitalization. Recommend palliative care but family does not want this at present  CHF exacerbation Monitor weight. continue Lasix 40 mg bid and o2. Monitor bmp. Continue kcl supplement  Copd exacerbation Continue and complete tapering course of prednisone. Continue o2, duoneb and symbicort  Pleural effusion S/P sided thoracocentesis, clinically improved. Monitor clinically  HCAP Completed antibiotics. Continue breathing treatment and o2. Monitor clinically  Protein malnutrition Monitor po intake, continue feeding supplement with MVI, oscal   Chronic atrial fibrillation Rate controlled. continue Cardizem 180 mg daily and Xarelto 20 mg daily. Digoxin was discontinued this admission.  Constipation Continue colace 100 mg daily  Goals of care: long term care   Labs/tests ordered: cbc and cmp  Family/ staff Communication: reviewed care plan with patient and nursing supervisor    Blanchie Serve, MD  [facility] [phone] (Monday-Friday 8 am - 5 pm) [phone] (afterhours)

## 2014-12-02 DIAGNOSIS — F39 Unspecified mood [affective] disorder: Secondary | ICD-10-CM | POA: Diagnosis not present

## 2014-12-02 DIAGNOSIS — F29 Unspecified psychosis not due to a substance or known physiological condition: Secondary | ICD-10-CM | POA: Diagnosis not present

## 2014-12-02 DIAGNOSIS — F0390 Unspecified dementia without behavioral disturbance: Secondary | ICD-10-CM | POA: Diagnosis not present

## 2014-12-17 DIAGNOSIS — I509 Heart failure, unspecified: Secondary | ICD-10-CM | POA: Diagnosis not present

## 2014-12-17 LAB — BASIC METABOLIC PANEL
BUN: 88 mg/dL — AB (ref 4–21)
CREATININE: 0.6 mg/dL (ref 0.5–1.1)
Glucose: 88 mg/dL
POTASSIUM: 4.2 mmol/L (ref 3.4–5.3)
SODIUM: 142 mmol/L (ref 137–147)

## 2014-12-17 LAB — CBC AND DIFFERENTIAL: WBC: 9.2 10^3/mL

## 2014-12-18 DIAGNOSIS — F0390 Unspecified dementia without behavioral disturbance: Secondary | ICD-10-CM | POA: Insufficient documentation

## 2014-12-18 DIAGNOSIS — E876 Hypokalemia: Secondary | ICD-10-CM | POA: Insufficient documentation

## 2014-12-18 NOTE — Progress Notes (Signed)
Patient ID: Jennifer Nielsen, female   DOB: 1924-10-12, 81 y.o.   MRN: [id number]   11/21/14  Facility:  Nursing Home Location:  [place] Room Number: 8299-3 LEVEL OF CARE:  SNF (31)   Chief Complaint  Patient presents with  . Hospitalization Follow-up    Physical deconditioning, acute and diastolic CHF, COPD, bilateral pleural effusion, chronic A. fib, hypokalemia, dementia, protein calorie malnutrition and psychosis    HISTORY OF PRESENT ILLNESS:  This is an 81 year old female who has been admitted to [facility] on 11/20/14 from [facility]. She has PMH of DVT, chronic A. fib on Xarelto, diastolic CHF, COPD, chronic respiratory failure on home O2 2 L and dementia. She was sent to the hospital due to being unresponsive and labored breathing. She was diagnosed to have acute diastolic CHF, pneumonia and pleural effusions. She was treated with antiemetics for bilateral pleural effusion and underwent thoracentesis. She was seen by cardiology and treated with Lasix infusion for diuresis.  She is a long-term care resident at [facility].  PAST MEDICAL HISTORY:  Past Medical History  Diagnosis Date  . Arrhythmia   . Arthritis   . Atrial fibrillation     Remains stable (As of 05/09/13)  . H/O blood clots   . Hyperlipemia   . GERD (gastroesophageal reflux disease)     Stable (As of 05/09/13)  . Osteoarthritis     Denies pain (As of 05/09/13)  . CHF (congestive heart failure)     chronic diastolic  . COPD (chronic obstructive pulmonary disease)     w/exacerbation  . Dementia     Remains stable & continues to function adequately in the current living environment with supervision. Has had little changes in behavior (AS OF 05/09/13)    CURRENT MEDICATIONS: Reviewed per MAR/see medication list  Allergies  Allergen Reactions  . Chocolate Itching  . Ciprofloxacin Itching  . Coffee Bean Extract [Coffea Arabica] Itching  . Coffee Flavor Itching  .  Eggs Or Egg-Derived Products   . Gentamycin [Gentamicin] Itching  . Imodium [Loperamide] Other (See Comments)    unknown  . Penicillins Itching  . Sulfa Antibiotics Itching  . Tea Itching     REVIEW OF SYSTEMS:  GENERAL: no change in appetite, no fatigue, no weight changes, no fever, chills or weakness RESPIRATORY: no cough, wheezing, hemoptysis CARDIAC: no chest pain, edema or palpitations GI: no abdominal pain, diarrhea, constipation, heart burn, nausea or vomiting  PHYSICAL EXAMINATION  GENERAL: no acute distress, obese EYES: conjunctivae normal, sclerae normal, normal eye lids NECK: supple, trachea midline, no neck masses, no thyroid tenderness, no thyromegaly LYMPHATICS: no LAN in the neck, no supraclavicular LAN RESPIRATORY: breathing is even & unlabored, BS CTAB CARDIAC: irregular heart rate, no murmur,no extra heart sounds, no edema GI: abdomen soft, normal BS, no masses, no tenderness, no hepatomegaly, no splenomegaly EXTREMITIES: Generalized weakness of bilateral lower extremity; able to move BUE PSYCHIATRIC: the patient is alert & oriented to person, affect & behavior appropriate  LABS/RADIOLOGY: Labs reviewed: Basic Metabolic Panel:  Recent Labs  11/13/14 0345  11/16/14 0455  11/18/14 0430 11/19/14 0432 11/20/14 0751  NA 150*  < > 137  < > 139 139 140  K 3.3*  < > 3.2*  < > 2.4* 3.9 3.1*  CL 95*  < > 70*  < > 67* 72* 78*  CO2 44*  < > >50*  < > >50* >50* 49*  GLUCOSE 165*  < >  100*  < > 109* 128* 151*  BUN 33*  < > 30*  < > 32* 42* 45*  CREATININE 0.58  < > 0.48  < > 0.58 0.67 0.61  CALCIUM 8.6*  < > 8.5*  < > 9.0 9.0 8.9  MG 2.1  --  2.0  --  2.1  --   --   < > = values in this interval not displayed. Liver Function Tests:  Recent Labs  11/07/14 0845 11/08/14 0349 11/12/14 0345  AST 49* 29 27  ALT 27 22 25   ALKPHOS 101 65 59  BILITOT 0.7 0.8 0.4  PROT 7.5 6.2* 6.1*  ALBUMIN 3.3* 2.6* 2.4*    Recent Labs  08/11/14 2251 08/19/14 0510    AMMONIA 22 27   CBC:  Recent Labs  10/09/14 1405 10/09/14 1753  11/07/14 0845  11/11/14 0356 11/12/14 0345 11/13/14 0345  WBC 12.5* 11.4*  < > 20.0*  < > 11.2* 8.7 8.7  NEUTROABS 10.2* 10.7*  --  17.0*  --   --   --   --   HGB 11.3* 11.1*  < > 12.1  < > 10.7* 10.0* 11.6*  HCT 37.3 36.6  < > 39.2  < > 35.4* 34.7* 38.4  MCV 98.4 99.7  < > 102.1*  < > 99.4 100.9* 102.1*  PLT 253 266  < > 296  < > 344 315 342  < > = values in this interval not displayed.  Cardiac Enzymes:  Recent Labs  11/07/14 0845 11/07/14 1713 11/07/14 2302  TROPONINI 0.11* 0.08* 0.06*   CBG:  Recent Labs  10/10/14 2333 11/07/14 0841  GLUCAP 190* 224*   Dg Chest 2 View  11/07/2014   CLINICAL DATA:  Shortness breath. Under ext bouts of with labored breathing. Transfer from skilled nursing facility.  EXAM: CHEST - 2 VIEW  COMPARISON:  CT of the chest 10/12/2014 and chest x-ray 10/11/2014.  FINDINGS: Heart is enlarged. Bilateral pleural effusions have increased, left greater than right. Mild edema is noted. Bibasilar airspace opacities are present. Degenerative changes are again noted in both shoulders.  IMPRESSION: 1. Increased interstitial edema and bilateral pleural effusions compatible with congestive heart failure. 2. Bibasilar airspace disease likely reflects atelectasis.   Electronically Signed   By: San Morelle M.D.   On: 11/07/2014 10:46   ASSESSMENT/PLAN:  Physical deconditioning - for rehabilitation  Chronic diastolic CHF - continue Lasix 40 mg by mouth daily; weigh daily  COPD - continue Symbicort 160-4.5 g/ACT 2 puffs into the lungs twice a day, DuoNeb 4 times a day and every 4 hours when necessary, O2 at 2 L/minute via [place] and prednisone taper  Bilateral pleural effusion -  S/P thoracentesis  Chronic atrial fibrillation - rate controlled; continue Xarelto 20 mg 1 tab by mouth daily, diltiazem 180 mg 1 capsule by mouth daily and digoxin was discontinued  Hypokalemia - continue  KCl 20 a.m. AQ one tab by mouth daily  Dementia - advanced  Protein calorie malnutrition, severe - albumin 2.4; RD consult  Psychosis - restart Zyprexa 5 mg 1 tab by mouth daily at bedtime    Goals of care:  Short-term rehabilitation then long-term care   Labs/test ordered:  CBC and BMP in 1 week   Spent 50 minutes in patient care.   [facility], NP [employer] [phone]

## 2014-12-23 ENCOUNTER — Encounter: Payer: Self-pay | Admitting: Adult Health

## 2014-12-23 ENCOUNTER — Non-Acute Institutional Stay (SKILLED_NURSING_FACILITY): Payer: Medicare Other | Admitting: Adult Health

## 2014-12-23 DIAGNOSIS — F0390 Unspecified dementia without behavioral disturbance: Secondary | ICD-10-CM

## 2014-12-23 DIAGNOSIS — F29 Unspecified psychosis not due to a substance or known physiological condition: Secondary | ICD-10-CM | POA: Diagnosis not present

## 2014-12-23 DIAGNOSIS — J441 Chronic obstructive pulmonary disease with (acute) exacerbation: Secondary | ICD-10-CM | POA: Diagnosis not present

## 2014-12-23 DIAGNOSIS — E46 Unspecified protein-calorie malnutrition: Secondary | ICD-10-CM | POA: Diagnosis not present

## 2014-12-23 DIAGNOSIS — K5900 Constipation, unspecified: Secondary | ICD-10-CM

## 2014-12-23 DIAGNOSIS — I5032 Chronic diastolic (congestive) heart failure: Secondary | ICD-10-CM | POA: Diagnosis not present

## 2014-12-23 DIAGNOSIS — I482 Chronic atrial fibrillation, unspecified: Secondary | ICD-10-CM

## 2014-12-23 DIAGNOSIS — E876 Hypokalemia: Secondary | ICD-10-CM | POA: Diagnosis not present

## 2014-12-28 ENCOUNTER — Inpatient Hospital Stay (HOSPITAL_COMMUNITY)
Admission: EM | Admit: 2014-12-28 | Discharge: 2015-01-02 | DRG: 291 | Disposition: A | Discharge status: Skilled Nursing Facility | Payer: Medicare Other | Attending: Internal Medicine | Admitting: Internal Medicine

## 2014-12-28 ENCOUNTER — Emergency Department (HOSPITAL_COMMUNITY): Payer: Medicare Other

## 2014-12-28 ENCOUNTER — Encounter (HOSPITAL_COMMUNITY): Payer: Self-pay

## 2014-12-28 DIAGNOSIS — M1990 Unspecified osteoarthritis, unspecified site: Secondary | ICD-10-CM | POA: Diagnosis present

## 2014-12-28 DIAGNOSIS — R739 Hyperglycemia, unspecified: Secondary | ICD-10-CM | POA: Diagnosis present

## 2014-12-28 DIAGNOSIS — I5033 Acute on chronic diastolic (congestive) heart failure: Secondary | ICD-10-CM | POA: Diagnosis not present

## 2014-12-28 DIAGNOSIS — Y95 Nosocomial condition: Secondary | ICD-10-CM | POA: Diagnosis present

## 2014-12-28 DIAGNOSIS — Z91018 Allergy to other foods: Secondary | ICD-10-CM | POA: Diagnosis not present

## 2014-12-28 DIAGNOSIS — K219 Gastro-esophageal reflux disease without esophagitis: Secondary | ICD-10-CM | POA: Diagnosis present

## 2014-12-28 DIAGNOSIS — I509 Heart failure, unspecified: Secondary | ICD-10-CM

## 2014-12-28 DIAGNOSIS — I482 Chronic atrial fibrillation: Secondary | ICD-10-CM | POA: Diagnosis present

## 2014-12-28 DIAGNOSIS — D72829 Elevated white blood cell count, unspecified: Secondary | ICD-10-CM | POA: Diagnosis present

## 2014-12-28 DIAGNOSIS — E876 Hypokalemia: Secondary | ICD-10-CM | POA: Diagnosis present

## 2014-12-28 DIAGNOSIS — J441 Chronic obstructive pulmonary disease with (acute) exacerbation: Secondary | ICD-10-CM | POA: Diagnosis not present

## 2014-12-28 DIAGNOSIS — E785 Hyperlipidemia, unspecified: Secondary | ICD-10-CM | POA: Diagnosis present

## 2014-12-28 DIAGNOSIS — E873 Alkalosis: Secondary | ICD-10-CM | POA: Diagnosis present

## 2014-12-28 DIAGNOSIS — Z993 Dependence on wheelchair: Secondary | ICD-10-CM

## 2014-12-28 DIAGNOSIS — Z5189 Encounter for other specified aftercare: Secondary | ICD-10-CM | POA: Diagnosis not present

## 2014-12-28 DIAGNOSIS — I4891 Unspecified atrial fibrillation: Secondary | ICD-10-CM | POA: Diagnosis present

## 2014-12-28 DIAGNOSIS — Z882 Allergy status to sulfonamides status: Secondary | ICD-10-CM

## 2014-12-28 DIAGNOSIS — Z66 Do not resuscitate: Secondary | ICD-10-CM | POA: Diagnosis present

## 2014-12-28 DIAGNOSIS — F0390 Unspecified dementia without behavioral disturbance: Secondary | ICD-10-CM | POA: Diagnosis not present

## 2014-12-28 DIAGNOSIS — T380X5A Adverse effect of glucocorticoids and synthetic analogues, initial encounter: Secondary | ICD-10-CM | POA: Diagnosis present

## 2014-12-28 DIAGNOSIS — E875 Hyperkalemia: Secondary | ICD-10-CM | POA: Diagnosis present

## 2014-12-28 DIAGNOSIS — Z7952 Long term (current) use of systemic steroids: Secondary | ICD-10-CM

## 2014-12-28 DIAGNOSIS — Z881 Allergy status to other antibiotic agents status: Secondary | ICD-10-CM | POA: Diagnosis not present

## 2014-12-28 DIAGNOSIS — I5031 Acute diastolic (congestive) heart failure: Secondary | ICD-10-CM | POA: Diagnosis not present

## 2014-12-28 DIAGNOSIS — Z87891 Personal history of nicotine dependence: Secondary | ICD-10-CM | POA: Diagnosis not present

## 2014-12-28 DIAGNOSIS — J9621 Acute and chronic respiratory failure with hypoxia: Secondary | ICD-10-CM | POA: Diagnosis not present

## 2014-12-28 DIAGNOSIS — J9601 Acute respiratory failure with hypoxia: Secondary | ICD-10-CM | POA: Diagnosis not present

## 2014-12-28 DIAGNOSIS — A419 Sepsis, unspecified organism: Secondary | ICD-10-CM | POA: Diagnosis present

## 2014-12-28 DIAGNOSIS — Z8249 Family history of ischemic heart disease and other diseases of the circulatory system: Secondary | ICD-10-CM

## 2014-12-28 DIAGNOSIS — Z803 Family history of malignant neoplasm of breast: Secondary | ICD-10-CM

## 2014-12-28 DIAGNOSIS — Z9981 Dependence on supplemental oxygen: Secondary | ICD-10-CM | POA: Diagnosis not present

## 2014-12-28 DIAGNOSIS — Z888 Allergy status to other drugs, medicaments and biological substances status: Secondary | ICD-10-CM | POA: Diagnosis not present

## 2014-12-28 DIAGNOSIS — R0602 Shortness of breath: Secondary | ICD-10-CM | POA: Diagnosis not present

## 2014-12-28 DIAGNOSIS — J189 Pneumonia, unspecified organism: Secondary | ICD-10-CM | POA: Diagnosis present

## 2014-12-28 DIAGNOSIS — Z79899 Other long term (current) drug therapy: Secondary | ICD-10-CM | POA: Diagnosis not present

## 2014-12-28 DIAGNOSIS — Z91012 Allergy to eggs: Secondary | ICD-10-CM

## 2014-12-28 DIAGNOSIS — M6281 Muscle weakness (generalized): Secondary | ICD-10-CM | POA: Diagnosis not present

## 2014-12-28 DIAGNOSIS — D638 Anemia in other chronic diseases classified elsewhere: Secondary | ICD-10-CM | POA: Diagnosis present

## 2014-12-28 DIAGNOSIS — J9811 Atelectasis: Secondary | ICD-10-CM | POA: Diagnosis not present

## 2014-12-28 DIAGNOSIS — J9600 Acute respiratory failure, unspecified whether with hypoxia or hypercapnia: Secondary | ICD-10-CM | POA: Diagnosis present

## 2014-12-28 DIAGNOSIS — J9622 Acute and chronic respiratory failure with hypercapnia: Secondary | ICD-10-CM | POA: Diagnosis not present

## 2014-12-28 DIAGNOSIS — J9620 Acute and chronic respiratory failure, unspecified whether with hypoxia or hypercapnia: Secondary | ICD-10-CM | POA: Diagnosis present

## 2014-12-28 DIAGNOSIS — R001 Bradycardia, unspecified: Secondary | ICD-10-CM | POA: Diagnosis present

## 2014-12-28 DIAGNOSIS — Z880 Allergy status to penicillin: Secondary | ICD-10-CM

## 2014-12-28 DIAGNOSIS — Z7901 Long term (current) use of anticoagulants: Secondary | ICD-10-CM | POA: Diagnosis not present

## 2014-12-28 DIAGNOSIS — R278 Other lack of coordination: Secondary | ICD-10-CM | POA: Diagnosis not present

## 2014-12-28 DIAGNOSIS — J449 Chronic obstructive pulmonary disease, unspecified: Secondary | ICD-10-CM | POA: Diagnosis not present

## 2014-12-28 HISTORY — DX: Other specified postprocedural states: Z98.890

## 2014-12-28 HISTORY — DX: Pleural effusion, not elsewhere classified: J90

## 2014-12-28 LAB — CBC WITH DIFFERENTIAL/PLATELET
Basophils Absolute: 0 10*3/uL (ref 0.0–0.1)
Basophils Relative: 0 % (ref 0–1)
Eosinophils Absolute: 0 10*3/uL (ref 0.0–0.7)
Eosinophils Relative: 0 % (ref 0–5)
HCT: 37.4 % (ref 36.0–46.0)
Hemoglobin: 10.9 g/dL — ABNORMAL LOW (ref 12.0–15.0)
LYMPHS ABS: 0.8 10*3/uL (ref 0.7–4.0)
Lymphocytes Relative: 5 % — ABNORMAL LOW (ref 12–46)
MCH: 29.9 pg (ref 26.0–34.0)
MCHC: 29.1 g/dL — ABNORMAL LOW (ref 30.0–36.0)
MCV: 102.7 fL — ABNORMAL HIGH (ref 78.0–100.0)
MONOS PCT: 9 % (ref 3–12)
Monocytes Absolute: 1.4 10*3/uL — ABNORMAL HIGH (ref 0.1–1.0)
NEUTROS ABS: 14 10*3/uL — AB (ref 1.7–7.7)
NEUTROS PCT: 86 % — AB (ref 43–77)
Platelets: 441 10*3/uL — ABNORMAL HIGH (ref 150–400)
RBC: 3.64 MIL/uL — ABNORMAL LOW (ref 3.87–5.11)
RDW: 17.5 % — ABNORMAL HIGH (ref 11.5–15.5)
WBC: 16.2 10*3/uL — ABNORMAL HIGH (ref 4.0–10.5)

## 2014-12-28 LAB — POTASSIUM: Potassium: 5.4 mmol/L — ABNORMAL HIGH (ref 3.5–5.1)

## 2014-12-28 LAB — URINALYSIS, ROUTINE W REFLEX MICROSCOPIC
Glucose, UA: NEGATIVE mg/dL
Hgb urine dipstick: NEGATIVE
KETONES UR: NEGATIVE mg/dL
Leukocytes, UA: NEGATIVE
Nitrite: NEGATIVE
PROTEIN: NEGATIVE mg/dL
Specific Gravity, Urine: 1.021 (ref 1.005–1.030)
Urobilinogen, UA: 1 mg/dL (ref 0.0–1.0)
pH: 5.5 (ref 5.0–8.0)

## 2014-12-28 LAB — I-STAT CHEM 8, ED
BUN: 36 mg/dL — ABNORMAL HIGH (ref 6–20)
CALCIUM ION: 1.17 mmol/L (ref 1.13–1.30)
CHLORIDE: 94 mmol/L — AB (ref 101–111)
CREATININE: 1 mg/dL (ref 0.44–1.00)
Glucose, Bld: 131 mg/dL — ABNORMAL HIGH (ref 65–99)
HCT: 37 % (ref 36.0–46.0)
Hemoglobin: 12.6 g/dL (ref 12.0–15.0)
Potassium: 6.2 mmol/L (ref 3.5–5.1)
Sodium: 134 mmol/L — ABNORMAL LOW (ref 135–145)
TCO2: 37 mmol/L (ref 0–100)

## 2014-12-28 LAB — BLOOD GAS, ARTERIAL
ACID-BASE EXCESS: 6.5 mmol/L — AB (ref 0.0–2.0)
Bicarbonate: 35 mEq/L — ABNORMAL HIGH (ref 20.0–24.0)
DELIVERY SYSTEMS: POSITIVE
DRAWN BY: 11249
EXPIRATORY PAP: 5
FIO2: 0.6 %
INSPIRATORY PAP: 16
LHR: 10 {breaths}/min
MODE: POSITIVE
O2 SAT: 96.2 %
PATIENT TEMPERATURE: 98.2
TCO2: 33.1 mmol/L (ref 0–100)
pCO2 arterial: 77.6 mmHg (ref 35.0–45.0)
pH, Arterial: 7.275 — ABNORMAL LOW (ref 7.350–7.450)
pO2, Arterial: 94.4 mmHg (ref 80.0–100.0)

## 2014-12-28 LAB — COMPREHENSIVE METABOLIC PANEL
ALBUMIN: 3.5 g/dL (ref 3.5–5.0)
ALK PHOS: 92 U/L (ref 38–126)
ALT: 34 U/L (ref 14–54)
AST: 32 U/L (ref 15–41)
Anion gap: 9 (ref 5–15)
BILIRUBIN TOTAL: 0.9 mg/dL (ref 0.3–1.2)
BUN: 31 mg/dL — ABNORMAL HIGH (ref 6–20)
CHLORIDE: 94 mmol/L — AB (ref 101–111)
CO2: 34 mmol/L — ABNORMAL HIGH (ref 22–32)
Calcium: 9.5 mg/dL (ref 8.9–10.3)
Creatinine, Ser: 0.8 mg/dL (ref 0.44–1.00)
GFR calc Af Amer: >60 mL/min (ref >60)
GFR calc non Af Amer: >60 mL/min (ref >60)
Glucose, Bld: 137 mg/dL — ABNORMAL HIGH (ref 65–99)
POTASSIUM: 6.2 mmol/L — AB (ref 3.5–5.1)
SODIUM: 137 mmol/L (ref 135–145)
Total Protein: 7.7 g/dL (ref 6.5–8.1)

## 2014-12-28 LAB — I-STAT TROPONIN, ED: Troponin i, poc: 0 ng/mL (ref 0.00–0.08)

## 2014-12-28 LAB — BRAIN NATRIURETIC PEPTIDE: B Natriuretic Peptide: 257.2 pg/mL — ABNORMAL HIGH (ref 0.0–100.0)

## 2014-12-28 LAB — GLUCOSE, CAPILLARY: GLUCOSE-CAPILLARY: 203 mg/dL — AB (ref 65–99)

## 2014-12-28 LAB — MRSA PCR SCREENING: MRSA by PCR: NEGATIVE

## 2014-12-28 LAB — I-STAT CG4 LACTIC ACID, ED: LACTIC ACID, VENOUS: 1.02 mmol/L (ref 0.5–2.0)

## 2014-12-28 MED ORDER — FUROSEMIDE 10 MG/ML IJ SOLN
40.0000 mg | Freq: Once | INTRAMUSCULAR | Status: AC
  Administered 2014-12-28: 40 mg via INTRAVENOUS
  Filled 2014-12-28: qty 4

## 2014-12-28 MED ORDER — METHYLPREDNISOLONE SODIUM SUCC 125 MG IJ SOLR
125.0000 mg | Freq: Once | INTRAMUSCULAR | Status: AC
  Administered 2014-12-28: 125 mg via INTRAVENOUS
  Filled 2014-12-28: qty 2

## 2014-12-28 MED ORDER — CHLORHEXIDINE GLUCONATE 0.12 % MT SOLN
15.0000 mL | Freq: Two times a day (BID) | OROMUCOSAL | Status: DC
  Administered 2014-12-29 – 2015-01-02 (×7): 15 mL via OROMUCOSAL
  Filled 2014-12-28 (×8): qty 15

## 2014-12-28 MED ORDER — ACETAMINOPHEN 325 MG PO TABS: 650.0000 mg | ORAL_TABLET | ORAL | Status: DC | PRN

## 2014-12-28 MED ORDER — IPRATROPIUM-ALBUTEROL 0.5-2.5 (3) MG/3ML IN SOLN: 3.0000 mL | Freq: Four times a day (QID) | RESPIRATORY_TRACT | Status: DC

## 2014-12-28 MED ORDER — RIVAROXABAN 20 MG PO TABS
20.0000 mg | ORAL_TABLET | Freq: Every day | ORAL | Status: DC
  Administered 2014-12-30 – 2015-01-02 (×4): 20 mg via ORAL
  Filled 2014-12-28 (×4): qty 1

## 2014-12-28 MED ORDER — VANCOMYCIN HCL IN DEXTROSE 1-5 GM/200ML-% IV SOLN
1000.0000 mg | Freq: Once | INTRAVENOUS | Status: AC
  Administered 2014-12-28: 1000 mg via INTRAVENOUS

## 2014-12-28 MED ORDER — PIPERACILLIN-TAZOBACTAM 3.375 G IVPB
3.3750 g | Freq: Three times a day (TID) | INTRAVENOUS | Status: DC
Start: 2014-12-28 — End: 2014-12-31
  Administered 2014-12-28 – 2014-12-31 (×8): 3.375 g via INTRAVENOUS
  Filled 2014-12-28 (×9): qty 50

## 2014-12-28 MED ORDER — SODIUM CHLORIDE 0.9 % IV SOLN: 250.0000 mL | INTRAVENOUS | Status: DC | PRN

## 2014-12-28 MED ORDER — VANCOMYCIN HCL IN DEXTROSE 750-5 MG/150ML-% IV SOLN
750.0000 mg | Freq: Two times a day (BID) | INTRAVENOUS | Status: DC
  Administered 2014-12-29 – 2014-12-31 (×5): 750 mg via INTRAVENOUS
  Filled 2014-12-28 (×5): qty 150

## 2014-12-28 MED ORDER — METHYLPREDNISOLONE SODIUM SUCC 40 MG IJ SOLR
40.0000 mg | Freq: Three times a day (TID) | INTRAMUSCULAR | Status: DC
  Administered 2014-12-29 – 2014-12-30 (×5): 40 mg via INTRAVENOUS
  Filled 2014-12-28 (×5): qty 1

## 2014-12-28 MED ORDER — DEXTROSE 50 % IV SOLN
1.0000 | Freq: Once | INTRAVENOUS | Status: AC
  Administered 2014-12-28: 50 mL via INTRAVENOUS
  Filled 2014-12-28: qty 50

## 2014-12-28 MED ORDER — CETYLPYRIDINIUM CHLORIDE 0.05 % MT LIQD
7.0000 mL | Freq: Two times a day (BID) | OROMUCOSAL | Status: DC
  Administered 2014-12-29 – 2015-01-01 (×7): 7 mL via OROMUCOSAL

## 2014-12-28 MED ORDER — IPRATROPIUM BROMIDE 0.02 % IN SOLN
0.5000 mg | Freq: Once | RESPIRATORY_TRACT | Status: AC
  Administered 2014-12-28: 0.5 mg via RESPIRATORY_TRACT
  Filled 2014-12-28: qty 2.5

## 2014-12-28 MED ORDER — LEVALBUTEROL HCL 1.25 MG/0.5ML IN NEBU
1.2500 mg | INHALATION_SOLUTION | Freq: Four times a day (QID) | RESPIRATORY_TRACT | Status: DC
  Administered 2014-12-28 – 2014-12-31 (×11): 1.25 mg via RESPIRATORY_TRACT
  Filled 2014-12-28 (×12): qty 0.5

## 2014-12-28 MED ORDER — BUDESONIDE-FORMOTEROL FUMARATE 160-4.5 MCG/ACT IN AERO
2.0000 | INHALATION_SPRAY | Freq: Two times a day (BID) | RESPIRATORY_TRACT | Status: DC
  Administered 2014-12-29 – 2014-12-30 (×2): 2 via RESPIRATORY_TRACT
  Filled 2014-12-28: qty 6

## 2014-12-28 MED ORDER — INSULIN ASPART 100 UNIT/ML ~~LOC~~ SOLN
10.0000 [IU] | Freq: Once | SUBCUTANEOUS | Status: AC
  Administered 2014-12-28: 10 [IU] via SUBCUTANEOUS
  Filled 2014-12-28: qty 1

## 2014-12-28 MED ORDER — VANCOMYCIN HCL IN DEXTROSE 1-5 GM/200ML-% IV SOLN
1000.0000 mg | Freq: Once | INTRAVENOUS | Status: DC
  Filled 2014-12-28: qty 200

## 2014-12-28 MED ORDER — DEXTROSE 5 % IV SOLN
1.0000 g | Freq: Two times a day (BID) | INTRAVENOUS | Status: DC
  Administered 2014-12-28: 1 g via INTRAVENOUS
  Filled 2014-12-28: qty 1

## 2014-12-28 MED ORDER — ONDANSETRON HCL 4 MG/2ML IJ SOLN: 4.0000 mg | Freq: Four times a day (QID) | INTRAMUSCULAR | Status: DC | PRN

## 2014-12-28 MED ORDER — IPRATROPIUM BROMIDE 0.02 % IN SOLN
0.5000 mg | Freq: Four times a day (QID) | RESPIRATORY_TRACT | Status: DC
  Administered 2014-12-28 – 2014-12-31 (×11): 0.5 mg via RESPIRATORY_TRACT
  Filled 2014-12-28 (×11): qty 2.5

## 2014-12-28 MED ORDER — OLANZAPINE 5 MG PO TABS
5.0000 mg | ORAL_TABLET | Freq: Every day | ORAL | Status: DC
  Filled 2014-12-28: qty 1

## 2014-12-28 MED ORDER — SODIUM CHLORIDE 0.9 % IJ SOLN
3.0000 mL | Freq: Two times a day (BID) | INTRAMUSCULAR | Status: DC
  Administered 2014-12-28 – 2014-12-30 (×3): 3 mL via INTRAVENOUS

## 2014-12-28 MED ORDER — SODIUM BICARBONATE 8.4 % IV SOLN
50.0000 meq | Freq: Once | INTRAVENOUS | Status: AC
  Administered 2014-12-28: 50 meq via INTRAVENOUS
  Filled 2014-12-28: qty 50

## 2014-12-28 MED ORDER — DILTIAZEM HCL ER COATED BEADS 180 MG PO CP24
180.0000 mg | ORAL_CAPSULE | Freq: Every day | ORAL | Status: DC
  Administered 2014-12-30 – 2015-01-02 (×4): 180 mg via ORAL
  Filled 2014-12-28 (×4): qty 1

## 2014-12-28 MED ORDER — FUROSEMIDE 10 MG/ML IJ SOLN
40.0000 mg | Freq: Two times a day (BID) | INTRAMUSCULAR | Status: DC
  Administered 2014-12-29: 40 mg via INTRAVENOUS
  Filled 2014-12-28: qty 4

## 2014-12-28 MED ORDER — SODIUM CHLORIDE 0.9 % IJ SOLN: 3.0000 mL | INTRAMUSCULAR | Status: DC | PRN

## 2014-12-28 MED ORDER — ALBUTEROL (5 MG/ML) CONTINUOUS INHALATION SOLN
15.0000 mg/h | INHALATION_SOLUTION | Freq: Once | RESPIRATORY_TRACT | Status: AC
  Administered 2014-12-28: 15 mg/h via RESPIRATORY_TRACT
  Filled 2014-12-28: qty 20

## 2014-12-28 MED ORDER — SODIUM CHLORIDE 0.9 % IV SOLN
1.0000 g | Freq: Once | INTRAVENOUS | Status: AC
  Administered 2014-12-28: 1 g via INTRAVENOUS
  Filled 2014-12-28: qty 10

## 2014-12-28 NOTE — Progress Notes (Signed)
RT had to place patient back on BIPAP due to decreased oxygenation and increased work of breathing. Patient is now on 16/5, 60%, and a rate of 10.

## 2014-12-28 NOTE — Progress Notes (Signed)
RT weaned patient off BIPAP to nasal canula per MD. RT will continue to monitor

## 2014-12-28 NOTE — Progress Notes (Signed)
ANTIBIOTIC CONSULT NOTE - INITIAL  Pharmacy Consult for ceftazidime Indication: HCAP  Allergies  Allergen Reactions  . Chocolate Itching  . Ciprofloxacin Itching  . Coffee Bean Extract [Coffea Arabica] Itching  . Coffee Flavor Itching  . Eggs Or Egg-Derived Products   . Gentamycin [Gentamicin] Itching  . Imodium [Loperamide] Other (See Comments)    unknown  . Penicillins Itching  . Sulfa Antibiotics Itching  . Tea Itching    Patient Measurements: weight 92 kg, weight 67 inches   Vital Signs: Temp: 100 F (37.8 C) (07/16 1840) Temp Source: Rectal (07/16 1840) BP: 123/64 mmHg (07/16 1825) Pulse Rate: 99 (07/16 1825) Intake/Output from previous day:   Intake/Output from this shift:    Labs:  Recent Labs  12/28/14 1742 12/28/14 1852  WBC 16.2*  --   HGB 10.9* 12.6  PLT 441*  --   CREATININE 0.80 1.00   Estimated Creatinine Clearance: 45.3 mL/min (by C-G formula based on Cr of 1). No results for input(s): VANCOTROUGH, VANCOPEAK, VANCORANDOM, GENTTROUGH, GENTPEAK, GENTRANDOM, TOBRATROUGH, TOBRAPEAK, TOBRARND, AMIKACINPEAK, AMIKACINTROU, AMIKACIN in the last 72 hours.   Microbiology: No results found for this or any previous visit (from the past 720 hour(s)).  Medical History: Past Medical History  Diagnosis Date  . Arrhythmia   . Arthritis   . Atrial fibrillation     Remains stable (As of 05/09/13)  . H/O blood clots   . Hyperlipemia   . GERD (gastroesophageal reflux disease)     Stable (As of 05/09/13)  . Osteoarthritis     Denies pain (As of 05/09/13)  . CHF (congestive heart failure)     chronic diastolic  . COPD (chronic obstructive pulmonary disease)     w/exacerbation  . Dementia     Remains stable & continues to function adequately in the current living environment with supervision. Has had little changes in behavior (AS OF 05/09/13)   Assessment: Patient's an 81 y.o F presents to the ED from [facility] with c/o SOB.  BL pleural effusions and  atelectasis noted on CXR.   To start ceftazidime for suspected PNA. Of note, patient is allergic to PCN (rxn- itching) but she tolerated ceftriaxone and cefepime in the past. - scr 1 (est crcl ~45)  Plan:  - ceftazidime 1gm IV q12h - f/u cultures  Jennifer Nielsen 12/28/2014,7:09 PM

## 2014-12-28 NOTE — Progress Notes (Addendum)
Progress Note  I came to reassess patient at SDU, she is now minimally responsive on BiPAP.  She was given insulin in ER for hyperkalemia, rechecked blood sugar which came back at 200. Lung exam seems unchanged. So far has diuresed 0.5 L from IV lasix given in ER. Have requested an ABG stat. Stopped q hs Zyprexa. I updated family members on mental status change.

## 2014-12-28 NOTE — ED Provider Notes (Signed)
CSN: [id number]     Arrival date & time 12/28/14  1719 History   First MD Initiated Contact with Patient 12/28/14 1721     Chief Complaint  Patient presents with  . Shortness of Breath     (Consider location/radiation/quality/duration/timing/severity/associated sxs/prior Treatment) The history is provided by the patient.  Jennifer Nielsen is a 81 y.o. female history of COPD, A. Fib, dementia, CHF, here presenting with worsening shortness of breath. Worsening shortness of breath for the last 3 days. Patient is residing at it nursing home is DO NOT RESUSCITATE. Patient has been feeling very short of breath yesterday but refused to come to the ER for evaluation. Chronic cough but no fevers. Also noticed that her legs is a more swollen. She was admitted a month ago for CHF and COPD exacerbation. Patient is suppose to wear oxygen but often takes it off. Was noted to be 90% on 4L NS as per EMS. EMS placed her on CPAP and she now feels better.   Level V caveat- dementia    Past Medical History  Diagnosis Date  . Arrhythmia   . Arthritis   . Atrial fibrillation     Remains stable (As of 05/09/13)  . H/O blood clots   . Hyperlipemia   . GERD (gastroesophageal reflux disease)     Stable (As of 05/09/13)  . Osteoarthritis     Denies pain (As of 05/09/13)  . CHF (congestive heart failure)     chronic diastolic  . COPD (chronic obstructive pulmonary disease)     w/exacerbation  . Dementia     Remains stable & continues to function adequately in the current living environment with supervision. Has had little changes in behavior (AS OF 05/09/13)   Past Surgical History  Procedure Laterality Date  . Appendectomy    . Gallbladder surgery    . Hip surgery      Right-Metal plate   . Leg surgery      Left leg   Family History  Problem Relation Age of Onset  . Colon cancer Neg Hx   . Heart disease Father   . Breast cancer Daughter    History  Substance Use Topics  . Smoking status: Former  Research scientist (life sciences)  . Smokeless tobacco: Never Used  . Alcohol Use: No   OB History    No data available     Review of Systems  Respiratory: Positive for shortness of breath.   All other systems reviewed and are negative.     Allergies  Chocolate; Ciprofloxacin; Coffee bean extract; Coffee flavor; Eggs or egg-derived products; Gentamycin; Imodium; Penicillins; Sulfa antibiotics; and Tea  Home Medications   Prior to Admission medications   Medication Sig Start Date End Date Taking? Authorizing Provider  acetaminophen (TYLENOL) 500 MG tablet Take 500 mg by mouth 3 (three) times daily.    Yes Historical Provider, MD  AMBULATORY NON FORMULARY MEDICATION Medication Name: Med Pass, 120 mLs by mouth twice daily   Yes Historical Provider, MD  budesonide-formoterol (SYMBICORT) 160-4.5 MCG/ACT inhaler Inhale 2 puffs into the lungs 2 (two) times daily.   Yes Historical Provider, MD  Calcium Carbonate-Vitamin D (CALTRATE 600+D PO) Take 1 tablet by mouth every morning. For calcium supplement   Yes Historical Provider, MD  diltiazem (CARDIZEM CD) 180 MG 24 hr capsule Take 1 capsule (180 mg total) by mouth daily. For A-Fib 11/20/14  Yes Velvet Bathe, MD  Docusate Sodium (DSS) 100 MG CAPS Take 100 mg by mouth daily. For constipation  07/02/13  Yes Marianne L York, PA-C  furosemide (LASIX) 40 MG tablet Take 40 mg by mouth 2 (two) times daily.   Yes Historical Provider, MD  ipratropium-albuterol (DUONEB) 0.5-2.5 (3) MG/3ML SOLN 69ml nebulized 4 times daily scheduled and every 4hrs as needed for wheezing for 3 days. And then every 4hours as needed for wheezing. Patient taking differently: Take 3 mLs by mouth every 4 (four) hours as needed (for wheezing).  09/18/14  Yes Bonnielee Haff, MD  Multiple Vitamins-Minerals (DECUBI-VITE) CAPS Take 1 capsule by mouth daily.   Yes Historical Provider, MD  OLANZapine (ZYPREXA) 5 MG tablet Take 5 mg by mouth at bedtime.   Yes Historical Provider, MD  OXYGEN Inhale 2 L into the lungs  continuous.    Yes Historical Provider, MD  Polyethyl Glycol-Propyl Glycol (SYSTANE OP) Place 1 drop into both eyes 2 (two) times daily. For dry eyes   Yes Historical Provider, MD  potassium chloride SA (K-DUR,KLOR-CON) 20 MEQ tablet Take 1 tablet (20 mEq total) by mouth daily. 10/12/14  Yes Theodis Blaze, MD  Rivaroxaban (XARELTO) 20 MG TABS tablet Take 20 mg by mouth daily. For DVT   Yes Historical Provider, MD  predniSONE (DELTASONE) 10 MG tablet Take 2 tablets (20 mg total) by mouth as directed. Take 2 tablets by mouth for the next 2 days. Then take 1 table by mouth for the next 2 days, then take 0.5 tablets by mouth for the next 2 days. Patient not taking: Reported on 12/28/2014 11/20/14   Velvet Bathe, MD   BP 123/64 mmHg  Pulse 99  Temp(Src) 100 F (37.8 C) (Rectal)  Resp 32  SpO2 100% Physical Exam  Constitutional:  Chronically ill, tachypneic.   HENT:  Head: Normocephalic.  Eyes: Conjunctivae are normal. Pupils are equal, round, and reactive to light.  Neck: Normal range of motion. Neck supple.  Cardiovascular: Regular rhythm and normal heart sounds.   Slightly tachy   Pulmonary/Chest:  Diminished throughout, minimal wheezing   Abdominal: Soft. Bowel sounds are normal. She exhibits no distension. There is no tenderness. There is no rebound.  Musculoskeletal:  2+ edema bilaterally   Neurological: She is alert.  Demented, moving all extremities   Skin: Skin is warm and dry.  Psychiatric: She has a normal mood and affect. Her behavior is normal. Judgment and thought content normal.  Nursing note and vitals reviewed.   ED Course  Procedures (including critical care time) Labs Review Labs Reviewed  CBC WITH DIFFERENTIAL/PLATELET - Abnormal; Notable for the following:    WBC 16.2 (*)    RBC 3.64 (*)    Hemoglobin 10.9 (*)    MCV 102.7 (*)    MCHC 29.1 (*)    RDW 17.5 (*)    Platelets 441 (*)    Neutrophils Relative % 86 (*)    Neutro Abs 14.0 (*)    Lymphocytes Relative  5 (*)    Monocytes Absolute 1.4 (*)    All other components within normal limits  COMPREHENSIVE METABOLIC PANEL - Abnormal; Notable for the following:    Potassium 6.2 (*)    Chloride 94 (*)    CO2 34 (*)    Glucose, Bld 137 (*)    BUN 31 (*)    All other components within normal limits  BRAIN NATRIURETIC PEPTIDE - Abnormal; Notable for the following:    B Natriuretic Peptide 257.2 (*)    All other components within normal limits  URINALYSIS, ROUTINE W REFLEX MICROSCOPIC (NOT AT  [facility]) - Abnormal; Notable for the following:    Color, Urine AMBER (*)    Bilirubin Urine SMALL (*)    All other components within normal limits  I-STAT CHEM 8, ED - Abnormal; Notable for the following:    Sodium 134 (*)    Potassium 6.2 (*)    Chloride 94 (*)    BUN 36 (*)    Glucose, Bld 131 (*)    All other components within normal limits  CULTURE, BLOOD (ROUTINE X 2)  CULTURE, BLOOD (ROUTINE X 2)  I-STAT TROPOININ, ED  I-STAT CG4 LACTIC ACID, ED    Imaging Review Dg Chest Port 1 View  12/28/2014   CLINICAL DATA:  Respiratory distress.  EXAM: PORTABLE CHEST - 1 VIEW  COMPARISON:  11/14/2014  FINDINGS: There is cardiac enlargement. Bilateral pleural effusions are identified left greater than right. There is mild diffuse interstitial edema. Atelectasis is identified overlying the effusions.  IMPRESSION: 1. Congestive heart failure.   Electronically Signed   By: Kerby Moors M.D.   On: 12/28/2014 18:26     EKG Interpretation   Date/Time:  Saturday December 28 2014 19:24:57 EDT Ventricular Rate:  106 PR Interval:    QRS Duration: 80 QT Interval:  336 QTC Calculation: 446 R Axis:   86 Text Interpretation:  Atrial fibrillation Anterior infarct, old No  significant change since last tracing Confirmed by YAO  MD, DAVID ([id number])  on 12/28/2014 7:31:33 PM      MDM   Final diagnoses:  Shortness of breath   Jennifer Nielsen is a 81 y.o. female here with SOB, requiring bipap. Patient is DNR. Likely COPD  with CHF, possible pneumonia. Will do sepsis workup, BNP, CXR. Will admit.   Marland Kitchennpw WBC 16, low grade temp 100 rectally. CXR showed CHF. Empirically treated for HCAP. Also given nebs and steroids for COPD. Given lasix for CHF. K 6.2, repeated and still elevated. Given bicarb, calcium. Held [place] given mental status. Will admit.     Wandra Arthurs, MD 12/28/14 [phone]

## 2014-12-28 NOTE — ED Notes (Signed)
She comes from [facility] ([address] [place]) with c/o shortness of breath.  She arrives on CPAP awake and lucid and short of breath.  Our R.T. Meets here here and  Places her on our bi-pap machine.

## 2014-12-28 NOTE — Progress Notes (Signed)
ANTIBIOTIC CONSULT NOTE - INITIAL  Pharmacy Consult for Vancomycin & Zosyn Indication: HCAP  Allergies  Allergen Reactions  . Chocolate Itching  . Ciprofloxacin Itching  . Coffee Bean Extract [Coffea Arabica] Itching  . Coffee Flavor Itching  . Eggs Or Egg-Derived Products   . Gentamycin [Gentamicin] Itching  . Imodium [Loperamide] Other (See Comments)    unknown  . Penicillins Itching  . Sulfa Antibiotics Itching  . Tea Itching    Patient Measurements: weight 92 kg, weight 67 inches   Vital Signs: Temp: 100 F (37.8 C) (07/16 1840) Temp Source: Rectal (07/16 1840) BP: 123/52 mmHg (07/16 2117) Pulse Rate: 116 (07/16 2117) Intake/Output from previous day:   Intake/Output from this shift:    Labs:  Recent Labs  12/28/14 1742 12/28/14 1852  WBC 16.2*  --   HGB 10.9* 12.6  PLT 441*  --   CREATININE 0.80 1.00   Estimated Creatinine Clearance: 45.3 mL/min (by C-G formula based on Cr of 1). No results for input(s): VANCOTROUGH, VANCOPEAK, VANCORANDOM, GENTTROUGH, GENTPEAK, GENTRANDOM, TOBRATROUGH, TOBRAPEAK, TOBRARND, AMIKACINPEAK, AMIKACINTROU, AMIKACIN in the last 72 hours.   Microbiology: No results found for this or any previous visit (from the past 720 hour(s)).  Medical History: Past Medical History  Diagnosis Date  . Arrhythmia   . Arthritis   . Atrial fibrillation     Remains stable (As of 05/09/13)  . H/O blood clots   . Hyperlipemia   . GERD (gastroesophageal reflux disease)     Stable (As of 05/09/13)  . Osteoarthritis     Denies pain (As of 05/09/13)  . CHF (congestive heart failure)     chronic diastolic  . COPD (chronic obstructive pulmonary disease)     w/exacerbation  . Dementia     Remains stable & continues to function adequately in the current living environment with supervision. Has had little changes in behavior (AS OF 05/09/13)   Assessment: Patient's an 81 y.o F presents to the ED from [facility] with c/o SOB.  BL pleural  effusions and atelectasis noted on CXR.   To start ceftazidime for suspected PNA. Of note, patient is allergic to PCN (rxn- itching) but she tolerated ceftriaxone and cefepime in the past. - scr 1 (est crcl ~45)  Pharmacy initially consulted to dose Ceftazidime, and patient received Ceftazidime 1gm IV @ 20:23  Upon admission, admitting physician has d/c'ed Ceftazidime and changed therapy to Vancomycin & Zosyn requesting Pharmacy to dose  Goal of therapy: Vancomycin trough level 15-20 mcg/ml  Plan:  - Vancomycin 1gm IV x 1 followed by 750mg  IV q12h - Zosyn 3.375gm IV q8h (each dose infused over 4 hrs) - f/u cultures - Monitor drug levels when appropriate  Jennifer Nielsen, Jennifer Nielsen, PharmD 12/28/2014,9:19 PM

## 2014-12-28 NOTE — H&P (Signed)
[facility] History and Physical  Geneviene Tesch ALP:[id number] DOB: 04/28/1925 DOA: 12/28/2014  Referring physician:  PCP: [name], MD   Chief Complaint: Shortness of breath  HPI: Jennifer Nielsen is a 81 y.o. female with a past medical history of chronic respiratory failure on 2 L supplemental oxygen at home, chronic diastolic congestive heart failure, panic obstructive pulmonary disease, who was recently discharged from the medicine service on 11/20/2014 at which time she was treated for acute decompensated diastolic congestive heart failure. She was discharged on Lasix 40 mg by mouth twice a day. She presents today as a transfer from her skilled nursing facility for increased work of breathing, having significant shortness of breath over the past 24 hours. She has also had associated cough, sputum production, wheezing. Family members who were present at bedside stating that she has had a 30 pound weight gain since her last hospitalization. A chest x-ray performed in the emergency department revealed findings consistent with congestive heart failure. He was given 40 mg of IV Lasix. She was also started on broad-spectrum IV and microbial therapy for the possibility of a superimposed healthcare associated pneumonia. Patient unable to provide history given advanced dementia. History obtained from emergency room staff and family members who were present at bedside.                                                             Review of Systems:  Unable to obtain reliable review of systems due to advanced dementia  Past Medical History  Diagnosis Date  . Arrhythmia   . Arthritis   . Atrial fibrillation     Remains stable (As of 05/09/13)  . H/O blood clots   . Hyperlipemia   . GERD (gastroesophageal reflux disease)     Stable (As of 05/09/13)  . Osteoarthritis     Denies pain (As of 05/09/13)  . CHF (congestive heart failure)     chronic diastolic  . COPD (chronic obstructive pulmonary  disease)     w/exacerbation  . Dementia     Remains stable & continues to function adequately in the current living environment with supervision. Has had little changes in behavior (AS OF 05/09/13)   Past Surgical History  Procedure Laterality Date  . Appendectomy    . Gallbladder surgery    . Hip surgery      Right-Metal plate   . Leg surgery      Left leg   Social History:  reports that she has quit smoking. She has never used smokeless tobacco. She reports that she does not drink alcohol or use illicit drugs.  Allergies  Allergen Reactions  . Chocolate Itching  . Ciprofloxacin Itching  . Coffee Bean Extract [Coffea Arabica] Itching  . Coffee Flavor Itching  . Eggs Or Egg-Derived Products   . Gentamycin [Gentamicin] Itching  . Imodium [Loperamide] Other (See Comments)    unknown  . Penicillins Itching  . Sulfa Antibiotics Itching  . Tea Itching    Family History  Problem Relation Age of Onset  . Colon cancer Neg Hx   . Heart disease Father   . Breast cancer Daughter     Prior to Admission medications   Medication Sig Start Date End Date Taking? Authorizing Provider  acetaminophen (TYLENOL) 500 MG tablet Take 500  mg by mouth 3 (three) times daily.    Yes Historical Provider, MD  AMBULATORY NON FORMULARY MEDICATION Medication Name: Med Pass, 120 mLs by mouth twice daily   Yes Historical Provider, MD  budesonide-formoterol (SYMBICORT) 160-4.5 MCG/ACT inhaler Inhale 2 puffs into the lungs 2 (two) times daily.   Yes Historical Provider, MD  Calcium Carbonate-Vitamin D (CALTRATE 600+D PO) Take 1 tablet by mouth every morning. For calcium supplement   Yes Historical Provider, MD  diltiazem (CARDIZEM CD) 180 MG 24 hr capsule Take 1 capsule (180 mg total) by mouth daily. For A-Fib 11/20/14  Yes Velvet Bathe, MD  Docusate Sodium (DSS) 100 MG CAPS Take 100 mg by mouth daily. For constipation 07/02/13  Yes Bobby Rumpf York, PA-C  furosemide (LASIX) 40 MG tablet Take 40 mg by mouth 2  (two) times daily.   Yes Historical Provider, MD  ipratropium-albuterol (DUONEB) 0.5-2.5 (3) MG/3ML SOLN 72ml nebulized 4 times daily scheduled and every 4hrs as needed for wheezing for 3 days. And then every 4hours as needed for wheezing. Patient taking differently: Take 3 mLs by mouth every 4 (four) hours as needed (for wheezing).  09/18/14  Yes Bonnielee Haff, MD  Multiple Vitamins-Minerals (DECUBI-VITE) CAPS Take 1 capsule by mouth daily.   Yes Historical Provider, MD  OLANZapine (ZYPREXA) 5 MG tablet Take 5 mg by mouth at bedtime.   Yes Historical Provider, MD  OXYGEN Inhale 2 L into the lungs continuous.    Yes Historical Provider, MD  Polyethyl Glycol-Propyl Glycol (SYSTANE OP) Place 1 drop into both eyes 2 (two) times daily. For dry eyes   Yes Historical Provider, MD  potassium chloride SA (K-DUR,KLOR-CON) 20 MEQ tablet Take 1 tablet (20 mEq total) by mouth daily. 10/12/14  Yes Theodis Blaze, MD  Rivaroxaban (XARELTO) 20 MG TABS tablet Take 20 mg by mouth daily. For DVT   Yes Historical Provider, MD  predniSONE (DELTASONE) 10 MG tablet Take 2 tablets (20 mg total) by mouth as directed. Take 2 tablets by mouth for the next 2 days. Then take 1 table by mouth for the next 2 days, then take 0.5 tablets by mouth for the next 2 days. Patient not taking: Reported on 12/28/2014 11/20/14   Velvet Bathe, MD   Physical Exam: Filed Vitals:   12/28/14 1825 12/28/14 1828 12/28/14 1840 12/28/14 1951  BP: 123/64     Pulse: 99   90  Temp:   100 F (37.8 C)   TempSrc:   Rectal   Resp: 32   31  SpO2: 100% 100%  100%    Wt Readings from Last 3 Encounters:  12/23/14 92.08 kg (203 lb)  11/22/14 85.367 kg (188 lb 3.2 oz)  11/21/14 85.367 kg (188 lb 3.2 oz)    General:  Appears calm and in no acute distress, she is awake and alert. Family members reporting that she is looking better from when she first came in Eyes: PERRL, normal lids, irises & conjunctiva ENT: grossly normal hearing, lips & tongue Neck:  no LAD, masses or thyromegaly Cardiovascular: RRR, no m/r/g. No LE edema. Telemetry: SR, no arrhythmias  Respiratory: Diminished breath sounds bilaterally with bilateral expiratory wheezes, bibasilar crackles and scattered rales Abdomen: Obese soft, ntnd Skin: She has bilateral ankle erythema Musculoskeletal: 2+ bilateral extremity edema Psychiatric: Patient is pleasant, cooperative, confused disoriented Neurologic: grossly non-focal.          Labs on Admission:  Basic Metabolic Panel:  Recent Labs Lab 12/28/14 1742 12/28/14 1852  NA 137 134*  K 6.2* 6.2*  CL 94* 94*  CO2 34*  --   GLUCOSE 137* 131*  BUN 31* 36*  CREATININE 0.80 1.00  CALCIUM 9.5  --    Liver Function Tests:  Recent Labs Lab 12/28/14 1742  AST 32  ALT 34  ALKPHOS 92  BILITOT 0.9  PROT 7.7  ALBUMIN 3.5   No results for input(s): LIPASE, AMYLASE in the last 168 hours. No results for input(s): AMMONIA in the last 168 hours. CBC:  Recent Labs Lab 12/28/14 1742 12/28/14 1852  WBC 16.2*  --   NEUTROABS 14.0*  --   HGB 10.9* 12.6  HCT 37.4 37.0  MCV 102.7*  --   PLT 441*  --    Cardiac Enzymes: No results for input(s): CKTOTAL, CKMB, CKMBINDEX, TROPONINI in the last 168 hours.  BNP (last 3 results)  Recent Labs  11/07/14 0845 11/16/14 0455 12/28/14 1743  BNP 785.7* 225.8* 257.2*    ProBNP (last 3 results)  Recent Labs  02/22/14 1417 06/17/14 1050  PROBNP 2244.0* 170.0*    CBG: No results for input(s): GLUCAP in the last 168 hours.  Radiological Exams on Admission: Dg Chest Port 1 View  12/28/2014   CLINICAL DATA:  Respiratory distress.  EXAM: PORTABLE CHEST - 1 VIEW  COMPARISON:  11/14/2014  FINDINGS: There is cardiac enlargement. Bilateral pleural effusions are identified left greater than right. There is mild diffuse interstitial edema. Atelectasis is identified overlying the effusions.  IMPRESSION: 1. Congestive heart failure.   Electronically Signed   By: Kerby Moors  M.D.   On: 12/28/2014 18:26    EKG: Independently reviewed.   Assessment/Plan Principal Problem:   Acute on chronic respiratory failure Active Problems:   Acute diastolic CHF (congestive heart failure)   HCAP (healthcare-associated pneumonia)   COPD exacerbation   Dementia   Hyperkalemia   Acute respiratory failure   1. Acute on chronic hypoxemic respiratory failure. Patient with a past medical history of chronic respiratory failureCOPD on 2 L supplemental oxygen at baseline, presenting with evidence of respiratory distress, having a respiratory rate of 31, initially requiring noninvasive positive pressure ventilation, likely secondary to combination of acute CHF along with COPD and possibly healthcare associated pneumonia. Initial chest x-ray showed findings consistent with CHF. Will treat with IV Lasix, systemic steroids, empiric IV antimicrobials therapy. Will admit patient to step down unit for close monitoring.  2. Acute on chronic diastolic congestive heart failure. Last transthoracic echocardiogram was performed on 11/10/2014 that showed a preserved ejection fraction of 60-65%. He presents with clinical signs and symptoms consistent with acute decompensated CHF including worsening bilateral extremity edema along with a reported 30 pound weight gain since her last hospitalization. She had been on Lasix 40 mg by mouth twice a day. Will treat with Lasix 40 mg IV twice a day, monitor daily weights, in's and outs. Follow-up on kidney function. 3. Chronic obstructive pulmonary disease exacerbation. I suspect COPD contributing to her hypoxemic respiratory failure. She had bilateral expiratory wheezes with diminished breath sounds on exam. Will treat with systemic steroids; Solu-Medrol 40 mg IV every 8 hours along with scheduled duo nebs and antibiotic. 4. Possible healthcare associated pneumonia. Initial x-ray did not show an obvious infiltrate however labs did reveal a significant increase in  white count to 16,200 from 8700 on 11/13/2014. She was also found to have a rectal temperature of 100 in the emergency department. Will start broad-spectrum IV and microbial therapy with vancomycin and Zosyn,  follow-up on repeat a.m. chest x-ray.  5. Hyperkalemia. Initial lab work showing a potassium of 6.2 which I suspect secondary to oral replacement she had been receiving at her facility. She was given an amp of D50 with 10 units of insulin in the emergency department. EKG was stable. 6. Chronic atrial fibrillation. Continue calcium channel blocker with Cardizem 180 mg by mouth daily. She is also anticoagulated with Xarelto.  7. History of advanced dementia. Patient is a nursing home resident, at baseline is non-ambulatory, wheelchair-bound. Continue Zyprexa at bedtime 8. DVT prophylaxis. Patient anticoagulated with Xarelto    Code Status: DNR Family Communication: I spoke with family members are present at bedside Disposition Plan: Anticipate patient will require greater than 2 nights hospitalization, admitted to the step down unit  Time spent: 70 min  Kelvin Cellar [facility] Pager [phone]

## 2014-12-29 ENCOUNTER — Encounter (HOSPITAL_COMMUNITY): Payer: Self-pay | Admitting: Internal Medicine

## 2014-12-29 ENCOUNTER — Inpatient Hospital Stay (HOSPITAL_COMMUNITY): Payer: Medicare Other

## 2014-12-29 DIAGNOSIS — Z7901 Long term (current) use of anticoagulants: Secondary | ICD-10-CM

## 2014-12-29 DIAGNOSIS — I5033 Acute on chronic diastolic (congestive) heart failure: Secondary | ICD-10-CM | POA: Diagnosis present

## 2014-12-29 DIAGNOSIS — R739 Hyperglycemia, unspecified: Secondary | ICD-10-CM

## 2014-12-29 DIAGNOSIS — T380X5A Adverse effect of glucocorticoids and synthetic analogues, initial encounter: Secondary | ICD-10-CM

## 2014-12-29 DIAGNOSIS — J9622 Acute and chronic respiratory failure with hypercapnia: Secondary | ICD-10-CM

## 2014-12-29 DIAGNOSIS — J189 Pneumonia, unspecified organism: Secondary | ICD-10-CM

## 2014-12-29 LAB — BASIC METABOLIC PANEL
ANION GAP: 8 (ref 5–15)
BUN: 29 mg/dL — AB (ref 6–20)
CHLORIDE: 94 mmol/L — AB (ref 101–111)
CO2: 37 mmol/L — ABNORMAL HIGH (ref 22–32)
CREATININE: 0.72 mg/dL (ref 0.44–1.00)
Calcium: 9.2 mg/dL (ref 8.9–10.3)
GFR calc Af Amer: >60 mL/min (ref >60)
GFR calc non Af Amer: >60 mL/min (ref >60)
Glucose, Bld: 149 mg/dL — ABNORMAL HIGH (ref 65–99)
Potassium: 5.1 mmol/L (ref 3.5–5.1)
Sodium: 139 mmol/L (ref 135–145)

## 2014-12-29 LAB — GLUCOSE, CAPILLARY
GLUCOSE-CAPILLARY: 160 mg/dL — AB (ref 65–99)
GLUCOSE-CAPILLARY: 162 mg/dL — AB (ref 65–99)
GLUCOSE-CAPILLARY: 167 mg/dL — AB (ref 65–99)
GLUCOSE-CAPILLARY: 171 mg/dL — AB (ref 65–99)
GLUCOSE-CAPILLARY: 178 mg/dL — AB (ref 65–99)
Glucose-Capillary: 163 mg/dL — ABNORMAL HIGH (ref 65–99)

## 2014-12-29 LAB — BLOOD GAS, ARTERIAL
ACID-BASE EXCESS: 8 mmol/L — AB (ref 0.0–2.0)
ACID-BASE EXCESS: 8.3 mmol/L — AB (ref 0.0–2.0)
Bicarbonate: 35.4 mEq/L — ABNORMAL HIGH (ref 20.0–24.0)
Bicarbonate: 36.2 mEq/L — ABNORMAL HIGH (ref 20.0–24.0)
DELIVERY SYSTEMS: POSITIVE
DRAWN BY: 31814
DRAWN BY: 31814
Delivery systems: POSITIVE
Expiratory PAP: 5
Expiratory PAP: 8
FIO2: 0.4 %
FIO2: 0.4 %
Inspiratory PAP: 18
Inspiratory PAP: 22
MODE: POSITIVE
Mode: POSITIVE
O2 SAT: 95.7 %
O2 Saturation: 95.7 %
PATIENT TEMPERATURE: 96.7
PCO2 ART: 70.3 mmHg — AB (ref 35.0–45.0)
PO2 ART: 85.9 mmHg (ref 80.0–100.0)
Patient temperature: 98.2
RATE: 20 resp/min
RATE: 20 resp/min
TCO2: 33.5 mmol/L (ref 0–100)
TCO2: 34.3 mmol/L (ref 0–100)
pCO2 arterial: 71.2 mmHg (ref 35.0–45.0)
pH, Arterial: 7.322 — ABNORMAL LOW (ref 7.350–7.450)
pH, Arterial: 7.319 — ABNORMAL LOW (ref 7.350–7.450)
pO2, Arterial: 80.7 mmHg (ref 80.0–100.0)

## 2014-12-29 LAB — CBC
HEMATOCRIT: 33.1 % — AB (ref 36.0–46.0)
Hemoglobin: 9.8 g/dL — ABNORMAL LOW (ref 12.0–15.0)
MCH: 30.3 pg (ref 26.0–34.0)
MCHC: 29.6 g/dL — AB (ref 30.0–36.0)
MCV: 102.5 fL — AB (ref 78.0–100.0)
PLATELETS: 372 10*3/uL (ref 150–400)
RBC: 3.23 MIL/uL — ABNORMAL LOW (ref 3.87–5.11)
RDW: 17.5 % — AB (ref 11.5–15.5)
WBC: 11.8 10*3/uL — ABNORMAL HIGH (ref 4.0–10.5)

## 2014-12-29 LAB — BRAIN NATRIURETIC PEPTIDE: B Natriuretic Peptide: 185.5 pg/mL — ABNORMAL HIGH (ref 0.0–100.0)

## 2014-12-29 MED ORDER — METOPROLOL TARTRATE 1 MG/ML IV SOLN
2.5000 mg | Freq: Once | INTRAVENOUS | Status: AC
  Administered 2014-12-29: 2.5 mg via INTRAVENOUS

## 2014-12-29 MED ORDER — METOPROLOL TARTRATE 1 MG/ML IV SOLN
2.5000 mg | Freq: Four times a day (QID) | INTRAVENOUS | Status: DC
  Administered 2014-12-29 – 2015-01-02 (×17): 2.5 mg via INTRAVENOUS
  Filled 2014-12-29 (×19): qty 5

## 2014-12-29 MED ORDER — INSULIN ASPART 100 UNIT/ML ~~LOC~~ SOLN
0.0000 [IU] | SUBCUTANEOUS | Status: DC
  Administered 2014-12-29 (×4): 3 [IU] via SUBCUTANEOUS
  Administered 2014-12-30: 2 [IU] via SUBCUTANEOUS
  Administered 2014-12-30: 8 [IU] via SUBCUTANEOUS
  Administered 2014-12-30 (×2): 3 [IU] via SUBCUTANEOUS
  Administered 2014-12-31: 5 [IU] via SUBCUTANEOUS
  Administered 2014-12-31: 2 [IU] via SUBCUTANEOUS
  Administered 2014-12-31 – 2015-01-01 (×5): 3 [IU] via SUBCUTANEOUS
  Administered 2015-01-01: 2 [IU] via SUBCUTANEOUS
  Administered 2015-01-01: 3 [IU] via SUBCUTANEOUS
  Administered 2015-01-01: 8 [IU] via SUBCUTANEOUS
  Administered 2015-01-02: 5 [IU] via SUBCUTANEOUS

## 2014-12-29 MED ORDER — FUROSEMIDE 10 MG/ML IJ SOLN
60.0000 mg | Freq: Four times a day (QID) | INTRAMUSCULAR | Status: DC
  Administered 2014-12-29 – 2014-12-31 (×8): 60 mg via INTRAVENOUS
  Filled 2014-12-29 (×8): qty 6

## 2014-12-29 NOTE — Progress Notes (Signed)
Utilization review completed.  

## 2014-12-29 NOTE — Progress Notes (Signed)
Progress Note   Jennifer Nielsen VXB:[id number] DOB: [birth year] DOA: 01-26-2015 PCP: [name], MD   Brief Narrative:   Jennifer Nielsen is an 81 y.o. female with a PMH of chronic respiratory failure on 2 L of home oxygen, chronic diastolic CHF, COPD, recent hospitalization 11/07/14-11/20/14 after being treated for decompensated heart failure and sent home on Lasix 40 mg twice a day who was sent to the ER 26-Jan-2015 with increased WOB 24 hours prior to admission as well as cough and wheezing. She was noted to have a 30 pound weight gain since her prior hospitalization and a chest x-ray confirmed CHF.  Assessment/Plan:   Principal Problem:   Acute on chronic respiratory failure secondary to decompensated acute on chronic diastolic CHF with possible underlying HCAP - Continue supplemental oxygen and BiPAP as needed to maintain saturations. - Continue to diurese.  Increase Lasix to 60 mg IV Q 6 hours. - Continue empiric antimicrobials therapy. - Continue systemic steroids.  Active Problems:   Steroid induced hyperglycemia - Start SSI Q 4 hours.    COPD exacerbation - Continue oxygen support and systemic steroids. Wean steroids as tolerated. - Continue Symbicort. - Continue nebulized broncho-dilators.    Acute on chronic diastolic CHF (congestive heart failure) - Monitor strict I/O and daily weights.  Check BNP. - Continue IV Lasix.  Dose increased.    Dementia - Zyprexa discontinued secondary to somnolence.    Hyperkalemia - Oral supplementation discontinued. - Potassium normalized after being given 1 amp of D50 with 10 units of insulin.    HCAP (healthcare-associated pneumonia) - Continue empiric vancomycin and Zosyn. - Follow-up blood cultures.    Atrial fibrillation - Continue Cardizem and Xarelto (currently not safe to take PO). - Since unlikely able to take PO, will place on Metoprolol 2.5 mg IV Q 6 hours.    DVT Prophylaxis - On chronic anticoagulation.  Family  Communication: No family currently at the bedside. Disposition Plan: From SNF.  Can return there when diuresed, mental status improved. Code Status:     Code Status Orders        Start     Ordered   01-26-2015 2113  Do not attempt resuscitation (DNR)   Continuous    Question Answer Comment  In the event of cardiac or respiratory ARREST Do not call a "code blue"   In the event of cardiac or respiratory ARREST Do not perform Intubation, CPR, defibrillation or ACLS   In the event of cardiac or respiratory ARREST Use medication by any route, position, wound care, and other measures to relive pain and suffering. May use oxygen, suction and manual treatment of airway obstruction as needed for comfort.      26-Jan-2015 2112    Advance Directive Documentation        Most Recent Value   Type of Advance Directive  Out of facility DNR (pink MOST or yellow form)   Pre-existing out of facility DNR order (yellow form or pink MOST form)     "MOST" Form in Place?          IV Access:    Peripheral IV   Procedures and diagnostic studies:   Dg Chest Port 1 View  01/26/2015   CLINICAL DATA:  Respiratory distress.  EXAM: PORTABLE CHEST - 1 VIEW  COMPARISON:  11/14/2014  FINDINGS: There is cardiac enlargement. Bilateral pleural effusions are identified left greater than right. There is mild diffuse interstitial edema. Atelectasis is identified overlying the effusions.  IMPRESSION: 1.  Congestive heart failure.   Electronically Signed   By: Kerby Moors M.D.   On: 12/28/2014 18:26     Medical Consultants:    None.  Anti-Infectives:    Vancomycin 12/28/14--->  Zosyn 12/28/14--->  Subjective:   Jennifer Nielsen is somnolent.  Opens eyes to sternal rub but is otherwise unresponsive.  Objective:    Filed Vitals:   12/29/14 0440 12/29/14 0500 12/29/14 0600 12/29/14 0700  BP: 119/77 138/66 124/59 150/96  Pulse: 89 103 105 106  Temp:      TempSrc:      Resp: 19 20 20 18   Height:        Weight:      SpO2: 99% 98% 99% 100%    Intake/Output Summary (Last 24 hours) at 12/29/14 0718 Last data filed at 12/29/14 0700  Gross per 24 hour  Intake    340 ml  Output    750 ml  Net   -410 ml    Exam: Gen:  Somnolent Cardiovascular:  Tachy, HSIR, No M/R/G, +JVD Respiratory:  Lungs diminished Gastrointestinal:  Abdomen soft, NT/ND, + BS Extremities:  3+ BLE edema   Data Reviewed:    Labs: Basic Metabolic Panel:  Recent Labs Lab 12/28/14 1742 12/28/14 1852 12/28/14 2150 12/29/14 0358  NA 137 134*  --  139  K 6.2* 6.2* 5.4* 5.1  CL 94* 94*  --  94*  CO2 34*  --   --  37*  GLUCOSE 137* 131*  --  149*  BUN 31* 36*  --  29*  CREATININE 0.80 1.00  --  0.72  CALCIUM 9.5  --   --  9.2   GFR Estimated Creatinine Clearance: 53.6 mL/min (by C-G formula based on Cr of 0.72). Liver Function Tests:  Recent Labs Lab 12/28/14 1742  AST 32  ALT 34  ALKPHOS 92  BILITOT 0.9  PROT 7.7  ALBUMIN 3.5   CBC:  Recent Labs Lab 12/28/14 1742 12/28/14 1852 12/29/14 0358  WBC 16.2*  --  11.8*  NEUTROABS 14.0*  --   --   HGB 10.9* 12.6 9.8*  HCT 37.4 37.0 33.1*  MCV 102.7*  --  102.5*  PLT 441*  --  372   BNP (last 3 results)  Recent Labs  02/22/14 1417 06/17/14 1050  PROBNP 2244.0* 170.0*   CBG:  Recent Labs Lab 12/28/14 2130 12/29/14 0038  GLUCAP 203* 160*   Sepsis Labs:  Recent Labs Lab 12/28/14 1742 12/28/14 1853 12/29/14 0358  WBC 16.2*  --  11.8*  LATICACIDVEN  --  1.02  --    Microbiology Recent Results (from the past 240 hour(s))  Blood culture (routine x 2)     Status: None (Preliminary result)   Collection Time: 12/28/14  6:45 PM  Result Value Ref Range Status   Specimen Description BLOOD RIGHT ANTECUBITAL  Final   Special Requests   Final    BOTTLES DRAWN AEROBIC AND ANAEROBIC 10CC EA Performed at [facility]    Culture PENDING  Incomplete   Report Status PENDING  Incomplete  Blood culture (routine x 2)     Status:  None (Preliminary result)   Collection Time: 12/28/14  6:48 PM  Result Value Ref Range Status   Specimen Description BLOOD LEFT ANTECUBITAL  Final   Special Requests   Final    BOTTLES DRAWN AEROBIC AND ANAEROBIC 5CC EA Performed at [facility]    Culture PENDING  Incomplete   Report Status PENDING  Incomplete  MRSA PCR Screening     Status: None   Collection Time: 12/28/14 10:00 PM  Result Value Ref Range Status   MRSA by PCR NEGATIVE NEGATIVE Final    Comment:        The GeneXpert MRSA Assay (FDA approved for NASAL specimens only), is one component of a comprehensive MRSA colonization surveillance program. It is not intended to diagnose MRSA infection nor to guide or monitor treatment for MRSA infections.      Medications:   . antiseptic oral rinse  7 mL Mouth Rinse q12n4p  . budesonide-formoterol  2 puff Inhalation BID  . chlorhexidine  15 mL Mouth Rinse BID  . diltiazem  180 mg Oral Daily  . furosemide  40 mg Intravenous BID  . ipratropium  0.5 mg Nebulization Q6H  . levalbuterol  1.25 mg Nebulization 4 times per day  . methylPREDNISolone (SOLU-MEDROL) injection  40 mg Intravenous 3 times per day  . piperacillin-tazobactam (ZOSYN)  IV  3.375 g Intravenous Q8H  . rivaroxaban  20 mg Oral Q breakfast  . sodium chloride  3 mL Intravenous Q12H  . vancomycin  750 mg Intravenous Q12H   Continuous Infusions:   Time spent: 35 minutes.  The patient is medically complex and requires high complexity decision making.   LOS: 1 day   Jennifer Nielsen  [facility] Pager [phone]. If unable to reach me by pager, please call my cell phone at [phone].  *Please refer to [email or web address], password [email or web address] to get updated schedule on who will round on this patient, as hospitalists switch teams weekly. If 7PM-7AM, please contact night-coverage at [email or web address], password [email or web address] for any overnight needs.  12/29/2014, 7:18 AM

## 2014-12-29 NOTE — Progress Notes (Signed)
Pt requested to be taken off Bipap. Pt shows no signs of increase wob at this time. Pt placed on 3Lpm nasal cannula SpO2-94% RR-18. RT will continue to monitor as needed.

## 2014-12-30 DIAGNOSIS — J441 Chronic obstructive pulmonary disease with (acute) exacerbation: Secondary | ICD-10-CM

## 2014-12-30 DIAGNOSIS — I482 Chronic atrial fibrillation: Secondary | ICD-10-CM

## 2014-12-30 DIAGNOSIS — J9601 Acute respiratory failure with hypoxia: Secondary | ICD-10-CM

## 2014-12-30 DIAGNOSIS — I5033 Acute on chronic diastolic (congestive) heart failure: Principal | ICD-10-CM

## 2014-12-30 LAB — BASIC METABOLIC PANEL
ANION GAP: 10 (ref 5–15)
BUN: 37 mg/dL — AB (ref 6–20)
CALCIUM: 8.3 mg/dL — AB (ref 8.9–10.3)
CO2: 39 mmol/L — ABNORMAL HIGH (ref 22–32)
Chloride: 92 mmol/L — ABNORMAL LOW (ref 101–111)
Creatinine, Ser: 0.74 mg/dL (ref 0.44–1.00)
GLUCOSE: 151 mg/dL — AB (ref 65–99)
Potassium: 3 mmol/L — ABNORMAL LOW (ref 3.5–5.1)
Sodium: 141 mmol/L (ref 135–145)

## 2014-12-30 LAB — GLUCOSE, CAPILLARY
GLUCOSE-CAPILLARY: 132 mg/dL — AB (ref 65–99)
GLUCOSE-CAPILLARY: 208 mg/dL — AB (ref 65–99)
Glucose-Capillary: 275 mg/dL — ABNORMAL HIGH (ref 65–99)
Glucose-Capillary: 145 mg/dL — ABNORMAL HIGH (ref 65–99)

## 2014-12-30 MED ORDER — POTASSIUM CHLORIDE CRYS ER 20 MEQ PO TBCR
40.0000 meq | EXTENDED_RELEASE_TABLET | Freq: Two times a day (BID) | ORAL | Status: AC
  Administered 2014-12-30 (×2): 40 meq via ORAL
  Filled 2014-12-30 (×2): qty 2

## 2014-12-30 MED ORDER — METHYLPREDNISOLONE SODIUM SUCC 40 MG IJ SOLR
40.0000 mg | Freq: Two times a day (BID) | INTRAMUSCULAR | Status: DC
  Administered 2014-12-30 – 2014-12-31 (×2): 40 mg via INTRAVENOUS
  Filled 2014-12-30 (×3): qty 1

## 2014-12-30 MED ORDER — ENSURE ENLIVE PO LIQD
237.0000 mL | ORAL | Status: DC
  Administered 2014-12-30 – 2015-01-01 (×2): 237 mL via ORAL

## 2014-12-30 MED ORDER — POTASSIUM CHLORIDE CRYS ER 20 MEQ PO TBCR
40.0000 meq | EXTENDED_RELEASE_TABLET | Freq: Once | ORAL | Status: AC
  Administered 2014-12-30: 40 meq via ORAL
  Filled 2014-12-30: qty 2

## 2014-12-30 MED ORDER — METOPROLOL TARTRATE 1 MG/ML IV SOLN
2.5000 mg | Freq: Once | INTRAVENOUS | Status: AC
  Administered 2014-12-30: 2.5 mg via INTRAVENOUS

## 2014-12-30 NOTE — Progress Notes (Addendum)
Patient ID: Jennifer Nielsen, female   DOB: 08-Oct-1924, 81 y.o.   MRN: [id number] [facility] PROGRESS NOTE  Jennifer Nielsen ESP:[id number] DOB: [birth year] DOA: 12/28/2014 PCP: Jennifer Pardon, MD  Brief narrative:    81 y.o. female with a past medical history of chronic respiratory failure on 2 L of home oxygen, chronic diastolic CHF, COPD, recent hospitalization 11/07/14-11/20/14 for decompensated CHF and was discharged with Lasix 40 mg twice a day. She presented to [facility] long hospital on 12/28/2014 with worsening shortness of breath, cough and wheezing for past 24 hours prior to the admission. She also reported 30 pound weight gain since prior hospitalization. Her chest x-ray study was consistent with CHF.  Barrier to discharge: Ongoing diuresis with IV Lasix. We'll remain in step down unit because of heart rate in 140s range.   Assessment/Plan:    Principal Problem: Acute on chronic respiratory failure with hypoxia / Acute on chronic decompensated diastolic CHF  - Hypoxia likely secondary to acute decompensated diastolic CHF as well as COPD exacerbation possible healthcare associated pneumonia. - Last 2-D echo on file is in May 2016 with preserved ejection fraction. The study was technically inadequate to evaluate left ventricular diastolic function. BNP is only mildly elevated at 185.5. - Patient required BiPAP to keep oxygen saturation above 90% but was transitioned overnight and this morning to oxygen via nasal cannula. Her current oxygen saturation is 97%. - Weight has trended down from 96 kg to 94.2 kg in past 48 hours. - Patient is -1.8 L fluid balance in last 24 hours. - Continue current diuresis with Lasix 60 mg IV every 6 hours. - Continue to monitor in step down unit because of tachycardia. - Continue daily weight and strict intake and output.  Active Problems: Sepsis secondary to healthcare associated pneumonia / Leukocytosis  - Sepsis criteria met today with hypothermia, T 96.7 F,  tachycardia, tachypnea, leukocytosis. Source of infection likely healthcare associated pneumonia considering hospitalization in early June of this year. - Obtain procalcitonin and lactic acid level per sepsis order set.  - Patient was started on broad-spectrum anti-biotics, vancomycin and Zosyn - Blood cultures drawn on the admission are pending.  Acute COPD exacerbation - As already mentioned, patient required BiPAP to keep oxygen saturation above 90% but this morning she is on nasal cannula oxygen support and oxygen saturation of 97% - Patient's respiratory status is stable. - Tapered down IV Solu-Medrol from every 6 hours down to every 12 hours - Continue Atrovent and Xopenex every 6 hours scheduled - May DC Symbicort while patient is on nebulizer  Steroid induced hyperglycemia - Continue sliding scale insulin  Anemia of chronic disease  - Likely secondary to chronic anticoagulation - Hemoglobin is stable at 9.8 - No current indications for transfusion  Metabolic alkalosis - Likely from Lasix - Continue to monitor BMP  Dementia - Zyprexa discontinued secondary to somnolence. - Mental status much better this morning.  Hyperkalemia / Hypokalemia  -  Secondary to supplementation because patient is on Lasix. - Patient currently takes potassium 40 mEq twice daily. - Potassium is 3.1 this morning.  Atrial fibrillation - CHADS vasc score at least 4 - Heart rate fluctuates from bradycardia to tachycardia. Patient is currently on Cardizem 180 mg daily and metoprolol 2.5 mg IV every 6 hours because her last heart rate was in 140s range. - Continue to monitor on telemetry - Continue anticoagulation with xarelto   DVT Prophylaxis - On chronic anticoagulation with xarelto     Code Status: DNR/DNI  Family Communication:  plan of care discussed with the patient Disposition Plan: continue to monitor in SDU due to tachycardia and HR in 140's    IV access:  Peripheral  IV  Procedures and diagnostic studies:    Dg Chest Port 1 View 2015/01/23  1. Similar to slightly increased interstitial pulmonary edema. 2. Otherwise unchanged cardiomegaly and left larger than right layering effusions with associated bibasilar atelectasis.   Electronically Signed   By: Jennifer Nielsen M.D.   On: 23-Jan-2015 08:20   Dg Chest Port 1 View 12/28/2014  1. Congestive heart failure.   Electronically Signed   By: Jennifer Nielsen M.D.   On: 12/28/2014 18:26   Medical Consultants:  None   Other Consultants:  Physical therapy Nutrition  IAnti-Infectives:   Vancomycin 12/28/14---> Zosyn 12/28/14--->   Jennifer Lenz, MD  [facility] Pager [phone]  Time spent in minutes: 25 minutes  If 7PM-7AM, please contact night-coverage [email or web address] Password [email or web address] 12/30/2014, 10:13 AM   LOS: 2 days    HPI/Subjective: No acute overnight events. Patient reports no shortness of breath.   Objective: Filed Vitals:   12/30/14 0600 12/30/14 0746 12/30/14 0800 12/30/14 0830  BP:   105/55   Pulse: 92  92   Temp:    97.9 F (36.6 C)  TempSrc:    Oral  Resp: 33  26   Height:      Weight:      SpO2: 97% 97% 97%     Intake/Output Summary (Last 24 hours) at 12/30/14 1013 Last data filed at 12/30/14 0600  Gross per 24 hour  Intake    732 ml  Output   2475 ml  Net  -1743 ml    Exam:   General:  Pt is alert, follows commands appropriately, not in acute distress  Cardiovascular: tachycardic, S1/S2 (+)  Respiratory: Clear to auscultation bilaterally, no wheezing, no crackles, no rhonchi  Abdomen: Soft, non tender, non distended, bowel sounds present  Extremities: +1 LE pitting edema, pulses DP and PT palpable bilaterally  Neuro: Grossly nonfocal  Data Reviewed: Basic Metabolic Panel:  Recent Labs Lab 12/28/14 1742 12/28/14 1852 12/28/14 2150 2015/01/23 0358 12/30/14 0413  NA 137 134*  --  139 141  K 6.2* 6.2* 5.4* 5.1 3.0*  CL 94* 94*  --  94* 92*  CO2 34*   --   --  37* 39*  GLUCOSE 137* 131*  --  149* 151*  BUN 31* 36*  --  29* 37*  CREATININE 0.80 1.00  --  0.72 0.74  CALCIUM 9.5  --   --  9.2 8.3*   Liver Function Tests:  Recent Labs Lab 12/28/14 1742  AST 32  ALT 34  ALKPHOS 92  BILITOT 0.9  PROT 7.7  ALBUMIN 3.5   No results for input(s): LIPASE, AMYLASE in the last 168 hours. No results for input(s): AMMONIA in the last 168 hours. CBC:  Recent Labs Lab 12/28/14 1742 12/28/14 1852 23-Jan-2015 0358  WBC 16.2*  --  11.8*  NEUTROABS 14.0*  --   --   HGB 10.9* 12.6 9.8*  HCT 37.4 37.0 33.1*  MCV 102.7*  --  102.5*  PLT 441*  --  372   Cardiac Enzymes: No results for input(s): CKTOTAL, CKMB, CKMBINDEX, TROPONINI in the last 168 hours. BNP: Invalid input(s): POCBNP CBG:  Recent Labs Lab 01-23-15 1212 01-23-15 1620 Jan 23, 2015 2035 01-23-2015 2318 12/30/14 0408  GLUCAP 162* 178* 171* 163* 132*    Recent Results (from  the past 240 hour(s))  Blood culture (routine x 2)     Status: None (Preliminary result)   Collection Time: 12/28/14  6:45 PM  Result Value Ref Range Status   Specimen Description BLOOD RIGHT ANTECUBITAL  Final   Special Requests   Final    BOTTLES DRAWN AEROBIC AND ANAEROBIC 10CC EA Performed at [facility]    Culture PENDING  Incomplete   Report Status PENDING  Incomplete  Blood culture (routine x 2)     Status: None (Preliminary result)   Collection Time: 12/28/14  6:48 PM  Result Value Ref Range Status   Specimen Description BLOOD LEFT ANTECUBITAL  Final   Special Requests   Final    BOTTLES DRAWN AEROBIC AND ANAEROBIC 5CC EA Performed at [facility]    Culture PENDING  Incomplete   Report Status PENDING  Incomplete  MRSA PCR Screening     Status: None   Collection Time: 12/28/14 10:00 PM  Result Value Ref Range Status   MRSA by PCR NEGATIVE NEGATIVE Final     Scheduled Meds: . budesonide-formoterol  2 puff Inhalation BID  . diltiazem  180 mg Oral Daily  . furosemide   60 mg Intravenous Q6H  . insulin aspart  0-15 Units Subcutaneous 6 times per day  . ipratropium  0.5 mg Nebulization Q6H  . levalbuterol  1.25 mg Nebulization 4 times per day  . methylPREDNISolone   40 mg Intravenous Q12H  . metoprolol  2.5 mg Intravenous 4 times per day  . piperacillin-tazobactam   3.375 g Intravenous Q8H  . potassium chloride  40 mEq Oral BID  . rivaroxaban  20 mg Oral Q breakfast  . vancomycin  750 mg Intravenous Q12H

## 2014-12-30 NOTE — Progress Notes (Signed)
Initial Nutrition Assessment  DOCUMENTATION CODES:   Obesity unspecified  INTERVENTION:   Provide Ensure Enlive po daily, each supplement provides 350 kcal and 20 grams of protein Provide Magic cup BID with meals, each supplement provides 290 kcal and 9 grams of protein Encourage PO intake RD to continue to monitor  NUTRITION DIAGNOSIS:   Inadequate oral intake related to poor appetite as evidenced by meal completion < 50%.  GOAL:   Patient will meet greater than or equal to 90% of their needs  MONITOR:   PO intake, Supplement acceptance, Labs, Weight trends, Skin, I & O's  REASON FOR ASSESSMENT:   Consult Diet education  ASSESSMENT:   81 y.o. female with a past medical history of chronic respiratory failure on 2 L of home oxygen, chronic diastolic CHF, COPD, recent hospitalization 11/07/14-11/20/14 for decompensated CHF and was discharged with Lasix 40 mg twice a day. She also reported 30 pound weight gain since prior hospitalization.   Pt reports decreased appetite PTA. States that she is provided 1 Ensure supplement daily. Pt eating 25% of HH/CHO modified diet. Per pt's daughter, pt's UBW is 169-170 lb. Pt has gained 30 lb since last admission.  Reviewed low sodium diet with pt and pt's daughter. RD to order Ensure supplements and magic cups with meals.  Nutrition-Focused physical exam completed. Findings are no fat depletion, mild muscle depletion, and mild edema.   Labs reviewed: CBGs: 132-208 Low K Elevated BUN  Diet Order:  Diet heart healthy/carb modified Room service appropriate?: Yes; Fluid consistency:: Thin  Skin:  Wound (see comment) (left ankle wound)  Last BM:  PTA  Height:   Ht Readings from Last 1 Encounters:  12/28/14 5\' 3"  (1.6 m)    Weight:   Wt Readings from Last 1 Encounters:  12/30/14 207 lb 10.8 oz (94.2 kg)    Ideal Body Weight:  52.3 kg  Wt Readings from Last 10 Encounters:  12/30/14 207 lb 10.8 oz (94.2 kg)  12/23/14 203 lb  (92.08 kg)  11/22/14 188 lb 3.2 oz (85.367 kg)  11/21/14 188 lb 3.2 oz (85.367 kg)  11/20/14 170 lb 12.7 oz (77.47 kg)  10/14/14 181 lb 6.4 oz (82.283 kg)  10/12/14 184 lb 8.4 oz (83.7 kg)  09/20/14 188 lb (85.276 kg)  09/19/14 190 lb (86.183 kg)  09/17/14 185 lb (83.915 kg)    BMI:  Body mass index is 36.8 kg/(m^2).  Estimated Nutritional Needs:   Kcal:  1500-1700  Protein:  65-75g  Fluid:  1.5L/day  EDUCATION NEEDS:   Education needs addressed  Clayton Bibles, MS, RD, LDN Pager: [phone] After Hours Pager: [phone]

## 2014-12-30 NOTE — Clinical Social Work Note (Signed)
Clinical Social Work Assessment  Patient Details  Name: Jennifer Nielsen MRN: [id number] Date of Birth: 1925/02/19  Date of referral:  12/30/14               Reason for consult:  Discharge Planning, Facility Placement                Permission sought to share information with:    Permission granted to share information::     Name::        Agency::     Relationship::     Contact Information:     Housing/Transportation Living arrangements for the past 2 months:  [place] of Information:  Patient, Adult Children Patient Interpreter Needed:  None Criminal Activity/Legal Involvement Pertinent to Current Situation/Hospitalization:  No - Comment as needed Significant Relationships:  Adult Children Lives with:  Facility Resident Do you feel safe going back to the place where you live?  Yes Need for family participation in patient care:  Yes (Comment)  Care giving concerns:  Daughters are concerned with pt's hospital readmissions. Pt has been hospitalized in FEB, x2 APRIL, and in MAY of this year.  Social Worker assessment / plan:  Pt hospitalized on 12/28/14 with acute on chronic respiratory failure with hypoxia, CHF, and pneumonia. She is a LTC resident from [facility]. CSW met with pt at bedside. Pt reports that she likes it at [facility] and plans to return when she feels better. Pt's daughter Jennifer Nielsen ) has confirmed this plan though she has some concerns regarding frequent hospitalizations.   Employment status:  Retired Forensic scientist:  Medicare PT Recommendations:  Not assessed at this time [place] / Referral to community resources:  [place]  Patient/Family's Response to care:  Pt's daughter's questioned if pt requires a higher level of care than  SNF. Jennifer Nielsen wondered if SNF could have done something to prevent readmissions. CSW encouraged family to request a meeting with DON at SNF to review concerns.   Patient/Family's Understanding of  and Emotional Response to Diagnosis, Current Treatment, and Prognosis:  Family is aware of pt's dx's though they wonder if more could be done to prevent frequent hospitalizations.    Emotional Assessment Appearance:  Appears stated age Attitude/Demeanor/Rapport:  Other (cooperative) Affect (typically observed):  Calm, Pleasant Orientation:  Oriented to Self, Oriented to Place, Oriented to  Time, Oriented to Situation Alcohol / Substance use:  Not Applicable Psych involvement (Current and /or in the community):  No (Comment)  Discharge Needs  Concerns to be addressed:  Discharge Planning Concerns Readmission within the last 30 days:  No Current discharge risk:  None Barriers to Discharge:  No Barriers Identified   Luretha Rued, [place] 12/30/2014, 4:07 PM

## 2014-12-31 ENCOUNTER — Encounter (HOSPITAL_COMMUNITY): Payer: Self-pay | Admitting: *Deleted

## 2014-12-31 DIAGNOSIS — E875 Hyperkalemia: Secondary | ICD-10-CM

## 2014-12-31 LAB — GLUCOSE, CAPILLARY
GLUCOSE-CAPILLARY: 145 mg/dL — AB (ref 65–99)
Glucose-Capillary: 159 mg/dL — ABNORMAL HIGH (ref 65–99)
Glucose-Capillary: 163 mg/dL — ABNORMAL HIGH (ref 65–99)
Glucose-Capillary: 164 mg/dL — ABNORMAL HIGH (ref 65–99)
Glucose-Capillary: 178 mg/dL — ABNORMAL HIGH (ref 65–99)
Glucose-Capillary: 184 mg/dL — ABNORMAL HIGH (ref 65–99)
Glucose-Capillary: 241 mg/dL — ABNORMAL HIGH (ref 65–99)

## 2014-12-31 MED ORDER — LEVALBUTEROL HCL 1.25 MG/0.5ML IN NEBU
0.6300 mg | INHALATION_SOLUTION | Freq: Three times a day (TID) | RESPIRATORY_TRACT | Status: DC
  Filled 2014-12-31 (×2): qty 0.26

## 2014-12-31 MED ORDER — CEFTRIAXONE SODIUM IN DEXTROSE 20 MG/ML IV SOLN
1.0000 g | INTRAVENOUS | Status: DC
  Administered 2015-01-01: 1 g via INTRAVENOUS
  Filled 2014-12-31 (×2): qty 50

## 2014-12-31 MED ORDER — METHYLPREDNISOLONE SODIUM SUCC 40 MG IJ SOLR
40.0000 mg | INTRAMUSCULAR | Status: DC
  Administered 2015-01-01: 40 mg via INTRAVENOUS
  Filled 2014-12-31: qty 1

## 2014-12-31 MED ORDER — FUROSEMIDE 10 MG/ML IJ SOLN
60.0000 mg | Freq: Two times a day (BID) | INTRAMUSCULAR | Status: DC
  Administered 2014-12-31 – 2015-01-01 (×2): 60 mg via INTRAVENOUS
  Filled 2014-12-31 (×2): qty 6

## 2014-12-31 MED ORDER — DEXTROSE 5 % IV SOLN: 500.0000 mg | INTRAVENOUS | Status: DC

## 2014-12-31 MED ORDER — IPRATROPIUM BROMIDE 0.02 % IN SOLN
0.5000 mg | Freq: Three times a day (TID) | RESPIRATORY_TRACT | Status: DC
  Administered 2014-12-31 – 2015-01-02 (×7): 0.5 mg via RESPIRATORY_TRACT
  Filled 2014-12-31 (×7): qty 2.5

## 2014-12-31 MED ORDER — LEVALBUTEROL HCL 0.63 MG/3ML IN NEBU
0.6300 mg | INHALATION_SOLUTION | Freq: Three times a day (TID) | RESPIRATORY_TRACT | Status: DC
  Administered 2014-12-31 – 2015-01-02 (×7): 0.63 mg via RESPIRATORY_TRACT
  Filled 2014-12-31 (×5): qty 3

## 2014-12-31 MED ORDER — DEXTROSE 5 % IV SOLN
500.0000 mg | INTRAVENOUS | Status: DC
Start: 2015-01-01 — End: 2015-01-02
  Administered 2015-01-01: 500 mg via INTRAVENOUS
  Filled 2014-12-31 (×2): qty 500

## 2014-12-31 MED ORDER — LEVALBUTEROL HCL 1.25 MG/0.5ML IN NEBU: 1.2500 mg | INHALATION_SOLUTION | Freq: Three times a day (TID) | RESPIRATORY_TRACT | Status: DC

## 2014-12-31 NOTE — Care Management Note (Signed)
Case Management Note  Patient Details  Name: Jennifer Nielsen MRN: [id number] Date of Birth: 07-15-1924  Subjective/Objective:                   Shortness of breath Action/Plan:  Discharge planning Expected Discharge Date:                  Expected Discharge Plan:  [place]  In-House Referral:     Discharge planning Services  CM Consult  Post Acute Care Choice:    Choice offered to:     DME Arranged:    DME Agency:     HH Arranged:    [place] Agency:     Status of Service:  Completed, signed off  Medicare Important Message Given:    Date Medicare IM Given:    Medicare IM give by:    Date Additional Medicare IM Given:    Additional Medicare Important Message give by:     If discussed at [place] of Stay Meetings, dates discussed:    Additional Comments: CM notes pt to go to SNF; CSW arranging.  NO other CM needs were communicated. Dellie Catholic, RN 12/31/2014, 12:47 PM

## 2014-12-31 NOTE — Progress Notes (Signed)
Pt had bowel movement, hr went into 160's and had 3 bts of vtach. Pt is asymptomatic and resting. Did EKG showed afib at 119. Paged NP to inform. Will continue to monitor pt.

## 2014-12-31 NOTE — Progress Notes (Signed)
Patient ID: Jennifer Nielsen, female   DOB: 06/15/1924, 81 y.o.   MRN: [id number] [facility] PROGRESS NOTE  Jennifer Nielsen WUJ:[id number] DOB: 03-Dec-1924 DOA: 12/28/2014 PCP: [name], MD  Brief narrative:    81 y.o. female with a past medical history of chronic respiratory failure on 2 L of home oxygen, chronic diastolic CHF, COPD, recent hospitalization 11/07/14-11/20/14 for decompensated CHF and was discharged with Lasix 40 mg twice a day. She presented to [facility] long hospital on 12/28/2014 with worsening shortness of breath, cough and wheezing for past 24 hours prior to the admission. She also reported 30 pound weight gain since prior hospitalization. Her chest x-ray study was consistent with CHF.  Barrier to discharge: Ongoing diuresis with IV Lasix. Taper down lasix. Anticipate D/C by 7/20 or 7/21 if she continues to feel better.    Assessment/Plan:    Principal Problem: Acute on chronic respiratory failure with hypoxia / Acute on chronic decompensated diastolic CHF  - Hypoxia likely secondary to acute decompensated diastolic CHF as well as COPD exacerbation possible healthcare associated pneumonia.  - Respiratory status remains stable. - Last 2-D echo on file is in May 2016 with preserved ejection fraction. The study was technically inadequate to evaluate left ventricular diastolic function. BNP only mildly elevated at 185.5. - Weight has trended down in past 48 hours from 95.8 kg down to 93.8 kg - Change Lasix from 60 mg IV every 6 hours to every 12 hours. - Transfer to telemetry floor - Continue daily weight and strict intake and output. - Replete electrolytes   Active Problems: Sepsis secondary to healthcare associated pneumonia / Leukocytosis  - Sepsis criteria met today with hypothermia, T 96.7 F, tachycardia, tachypnea, leukocytosis. Source of infection likely healthcare associated pneumonia considering hospitalization in early June of this year. - Order placed for  procalcitonin and lactic acid 7/18 but order somehow or rather got canceled (? Pt refused blood work) - Patient was started on broad-spectrum anti-biotics, vancomycin and Zosyn since admission; change to azithromycin and Rocephin today  - Blood cultures so far show no growth to date   Acute COPD exacerbation - Stable respiratory status  - Tapered down IV Solu-Medrol from every 12 hours down to every 24 hours - Continue Atrovent and Xopenex every 6 hours scheduled  Steroid induced hyperglycemia - Continue sliding scale insulin - CBG's in past 24 hours: 145, 164, 145  Anemia of chronic disease  - Likely secondary to chronic anticoagulation - Hemoglobin is stable   Metabolic alkalosis - Likely from Lasix  Dementia - Zyprexa discontinued secondary to somnolence. - Mental status ok this am   Hyperkalemia / Hypokalemia  - Secondary to supplementation because patient is on Lasix. - Potassium now WNL  Atrial fibrillation - CHADS vasc score at least 4 - Heart rate fluctuates from bradycardia to tachycardia.  - Continue to monitor on telemetry - Continue anticoagulation with xarelto  - Continue Cardizem 180 mg daily   DVT Prophylaxis - On chronic anticoagulation with xarelto     Code Status: DNR/DNI Family Communication:  plan of care discussed with the patient Disposition Plan: to SNF in next 24-48 hours is she continues to feel better.   IV access:  Peripheral IV  Procedures and diagnostic studies:    Dg Chest Port 1 View 2015/01/02  1. Similar to slightly increased interstitial pulmonary edema. 2. Otherwise unchanged cardiomegaly and left larger than right layering effusions with associated bibasilar atelectasis.   Electronically Signed   By: Dellis Filbert.D.  On: 12/29/2014 08:20   Dg Chest Port 1 View 12/28/2014  1. Congestive heart failure.   Electronically Signed   By: Kerby Moors M.D.   On: 12/28/2014 18:26   Medical Consultants:  None   Other Consultants:   Physical therapy Nutrition  IAnti-Infectives:   Vancomycin 12/28/14 --> 12/31/2014 Zosyn 12/28/14 --> 12/31/2014 Azithromycin and rocephin 01/01/2015 -->    DEVINE, ALMA, MD  [facility] Pager [phone]  Time spent in minutes: 25 minutes  If 7PM-7AM, please contact night-coverage [email or web address] Password [facility] 12/31/2014, 11:44 AM   LOS: 3 days    HPI/Subjective: No acute overnight events. Patient reports feeling better.   Objective: Filed Vitals:   12/31/14 0900 12/31/14 0930 12/31/14 0953 12/31/14 1105  BP:  118/81  119/75  Pulse:  91  101  Temp:    97.7 F (36.5 C)  TempSrc:    Oral  Resp: $Remo'27 25  24  'LyahD$ Height:      Weight:      SpO2:   95% 96%    Intake/Output Summary (Last 24 hours) at 12/31/14 1144 Last data filed at 12/31/14 1000  Gross per 24 hour  Intake    947 ml  Output   2675 ml  Net  -1728 ml    Exam:   General:  Pt is alert, not in acute distress  Cardiovascular: tachycardic, S1/S2 appreciated   Respiratory: No wheezing, no rhonchi   Abdomen: appreciate bowel sounds, non tender abdomen   Extremities: +1 leg swelling, pulses palpable   Neuro: Nonfocal  Data Reviewed: Basic Metabolic Panel:  Recent Labs Lab 12/28/14 1742 12/28/14 1852 12/28/14 2150 12/29/14 0358 12/30/14 0413 12/31/14 0345  NA 137 134*  --  139 141 140  K 6.2* 6.2* 5.4* 5.1 3.0* 4.1  CL 94* 94*  --  94* 92* 93*  CO2 34*  --   --  37* 39* 36*  GLUCOSE 137* 131*  --  149* 151* 154*  BUN 31* 36*  --  29* 37* 46*  CREATININE 0.80 1.00  --  0.72 0.74 0.78  CALCIUM 9.5  --   --  9.2 8.3* 8.3*   Liver Function Tests:  Recent Labs Lab 12/28/14 1742  AST 32  ALT 34  ALKPHOS 92  BILITOT 0.9  PROT 7.7  ALBUMIN 3.5   No results for input(s): LIPASE, AMYLASE in the last 168 hours. No results for input(s): AMMONIA in the last 168 hours. CBC:  Recent Labs Lab 12/28/14 1742 12/28/14 1852 12/29/14 0358  WBC 16.2*  --  11.8*  NEUTROABS 14.0*  --   --    HGB 10.9* 12.6 9.8*  HCT 37.4 37.0 33.1*  MCV 102.7*  --  102.5*  PLT 441*  --  372   Cardiac Enzymes: No results for input(s): CKTOTAL, CKMB, CKMBINDEX, TROPONINI in the last 168 hours. BNP: Invalid input(s): POCBNP CBG:  Recent Labs Lab 12/30/14 1143 12/30/14 1542 12/30/14 1953 12/31/14 0101 12/31/14 0727  GLUCAP 208* 275* 145* 164* 145*   Blood culture (routine x 2)     Status: None (Preliminary result)   Collection Time: 12/28/14  6:45 PM  Result Value Ref Range Status   Specimen Description BLOOD RIGHT ANTECUBITAL  Final   Culture   Final    NO GROWTH 1 DAY Performed at [facility]    Report Status PENDING  Incomplete  Blood culture (routine x 2)     Status: None (Preliminary result)   Collection Time: 12/28/14  6:48 PM  Result Value Ref Range Status   Specimen Description BLOOD LEFT ANTECUBITAL  Final   Culture   Final    NO GROWTH 1 DAY Performed at [facility]    Report Status PENDING  Incomplete  MRSA PCR Screening     Status: None   Collection Time: 12/28/14 10:00 PM  Result Value Ref Range Status   MRSA by PCR NEGATIVE NEGATIVE Final      . diltiazem  180 mg Oral Daily  . feeding supplement (ENSURE ENLIVE)  237 mL Oral Q24H  . furosemide  60 mg Intravenous Q6H  . insulin aspart  0-15 Units Subcutaneous 6 times per day  . ipratropium  0.5 mg Nebulization TID  . levalbuterol  0.63 mg Nebulization TID  . methylPREDNISolone (SOLU-MEDROL) injection  40 mg Intravenous Q12H  . metoprolol  2.5 mg Intravenous 4 times per day  . piperacillin-tazobactam (ZOSYN)  IV  3.375 g Intravenous Q8H  . rivaroxaban  20 mg Oral Q breakfast  . vancomycin  750 mg Intravenous Q12H

## 2014-12-31 NOTE — Evaluation (Signed)
Physical Therapy Evaluation Patient Details Name: Jennifer Nielsen MRN: [id number] DOB: 09-19-1924 Today's Date: 12/31/2014   History of Present Illness  81 y.o. female with a past medical history of chronic respiratory failure on 2 L of home oxygen, chronic diastolic CHF, COPD, recent hospitalization 11/07/14-11/20/14 for decompensated CHF.Marland Kitchen She presented to [facility] hospital on 12/28/2014 with worsening shortness of breath, cough and wheezing for past 24 hours prior to the admission. She also reported 30 pound weight gain since prior hospitalization. Her chest x-ray study was consistent with CHF  Clinical Impression  Pleasant  Lady who resides at  SNF, uses [facility], nonambulatory PTA> Patient will benefit from PT to continue mobility to decrease  Effects of bedrest and address problems listed in note below.    Follow Up Recommendations SNF;Supervision/Assistance - 24 hour    Equipment Recommendations  None recommended by PT    Recommendations for Other Services       Precautions / Restrictions Precautions Precautions: Fall Precaution Comments: needs O2      Mobility  Bed Mobility Overal bed mobility: Needs Assistance;+2 for physical assistance;+ 2 for safety/equipment Bed Mobility: Supine to Sit     Supine to sit: Total assist;+2 for physical assistance;+2 for safety/equipment;HOB elevated     General bed mobility comments: patient unable to assist  Transfers Overall transfer level: Needs assistance               General transfer comment: patient deemed not strong enough to safely attempt stand pivot so used [place] for OOB,  Ambulation/Gait                Stairs            Wheelchair Mobility    Modified Rankin (Stroke Patients Only)       Balance Overall balance assessment: History of Falls;Needs assistance Sitting-balance support: Feet supported Sitting balance-Leahy Scale: Fair Sitting balance - Comments: sits at midline and holds balance                                     Pertinent Vitals/Pain Pain Assessment: No/denies pain    Home Living Family/patient expects to be discharged to:: Skilled nursing facility                 Additional Comments: from [facility] place    Prior Function Level of Independence: Needs assistance   Gait / Transfers Assistance Needed: nonambulatory     Comments: patient states  2 person assist OOB, [place]     Hand Dominance        Extremity/Trunk Assessment   Upper Extremity Assessment: Generalized weakness           Lower Extremity Assessment: RLE deficits/detail;LLE deficits/detail RLE Deficits / Details: very weak, not deemed strong enough to pivot, feet are somewhat plantarflexed. LLE Deficits / Details: same as R  Cervical / Trunk Assessment: Kyphotic  Communication   Communication: No difficulties  Cognition Arousal/Alertness: Awake/alert Behavior During Therapy: WFL for tasks assessed/performed Overall Cognitive Status: Within Functional Limits for tasks assessed                      General Comments      Exercises        Assessment/Plan    PT Assessment Patient needs continued PT services  PT Diagnosis Generalized weakness   PT Problem List Decreased strength;Decreased activity tolerance;Decreased balance;Decreased mobility;Decreased safety  awareness;Decreased knowledge of use of DME  PT Treatment Interventions Functional mobility training;Therapeutic activities;Therapeutic exercise;Patient/family education;Wheelchair mobility training   PT Goals (Current goals can be found in the Care Plan section) Acute Rehab PT Goals Patient Stated Goal: wants to get her car inspected so she can drive PT Goal Formulation: With patient Time For Goal Achievement: 01/14/15 Potential to Achieve Goals: Good    Frequency Min 2X/week   Barriers to discharge        Co-evaluation               End of Session   Activity Tolerance: Patient  tolerated treatment well Patient left: in chair;with call bell/phone within reach;with chair alarm set Nurse Communication: Need for lift equipment;Mobility status         Time: 8177-1165 PT Time Calculation (min) (ACUTE ONLY): 21 min   Charges:   PT Evaluation $Initial PT Evaluation Tier I: 1 Procedure     PT G CodesMarcelino Freestone PT [phone]  12/31/2014, 9:25 AM

## 2015-01-01 DIAGNOSIS — I5031 Acute diastolic (congestive) heart failure: Secondary | ICD-10-CM

## 2015-01-01 LAB — BASIC METABOLIC PANEL
ANION GAP: 11 (ref 5–15)
Anion gap: 6 (ref 5–15)
BUN: 46 mg/dL — ABNORMAL HIGH (ref 6–20)
BUN: 49 mg/dL — ABNORMAL HIGH (ref 6–20)
CHLORIDE: 93 mmol/L — AB (ref 101–111)
CO2: 36 mmol/L — AB (ref 22–32)
CO2: 44 mmol/L — ABNORMAL HIGH (ref 22–32)
CREATININE: 0.78 mg/dL (ref 0.44–1.00)
Calcium: 8.3 mg/dL — ABNORMAL LOW (ref 8.9–10.3)
Calcium: 8.4 mg/dL — ABNORMAL LOW (ref 8.9–10.3)
Chloride: 93 mmol/L — ABNORMAL LOW (ref 101–111)
Creatinine, Ser: 0.72 mg/dL (ref 0.44–1.00)
GFR calc Af Amer: >60 mL/min (ref >60)
GFR calc non Af Amer: >60 mL/min (ref >60)
Glucose, Bld: 154 mg/dL — ABNORMAL HIGH (ref 65–99)
Glucose, Bld: 129 mg/dL — ABNORMAL HIGH (ref 65–99)
POTASSIUM: 3.8 mmol/L (ref 3.5–5.1)
Potassium: 4.1 mmol/L (ref 3.5–5.1)
SODIUM: 140 mmol/L (ref 135–145)
SODIUM: 143 mmol/L (ref 135–145)

## 2015-01-01 LAB — CBC
HEMATOCRIT: 35.3 % — AB (ref 36.0–46.0)
Hemoglobin: 10.3 g/dL — ABNORMAL LOW (ref 12.0–15.0)
MCH: 29.8 pg (ref 26.0–34.0)
MCHC: 29.2 g/dL — ABNORMAL LOW (ref 30.0–36.0)
MCV: 102 fL — AB (ref 78.0–100.0)
Platelets: 379 10*3/uL (ref 150–400)
RBC: 3.46 MIL/uL — ABNORMAL LOW (ref 3.87–5.11)
RDW: 17.1 % — ABNORMAL HIGH (ref 11.5–15.5)
WBC: 11.2 10*3/uL — AB (ref 4.0–10.5)

## 2015-01-01 LAB — GLUCOSE, CAPILLARY
GLUCOSE-CAPILLARY: 125 mg/dL — AB (ref 65–99)
GLUCOSE-CAPILLARY: 115 mg/dL — AB (ref 65–99)
GLUCOSE-CAPILLARY: 291 mg/dL — AB (ref 65–99)
Glucose-Capillary: 114 mg/dL — ABNORMAL HIGH (ref 65–99)
Glucose-Capillary: 200 mg/dL — ABNORMAL HIGH (ref 65–99)

## 2015-01-01 MED ORDER — GLUCERNA SHAKE PO LIQD
237.0000 mL | Freq: Three times a day (TID) | ORAL | Status: DC
  Administered 2015-01-02: 237 mL via ORAL
  Filled 2015-01-01 (×4): qty 237

## 2015-01-01 MED ORDER — FUROSEMIDE 40 MG PO TABS
40.0000 mg | ORAL_TABLET | Freq: Two times a day (BID) | ORAL | Status: DC
  Administered 2015-01-01 – 2015-01-02 (×2): 40 mg via ORAL
  Filled 2015-01-01 (×2): qty 1

## 2015-01-01 MED ORDER — PREDNISONE 50 MG PO TABS
50.0000 mg | ORAL_TABLET | Freq: Every day | ORAL | Status: DC
  Administered 2015-01-02: 50 mg via ORAL
  Filled 2015-01-01: qty 1

## 2015-01-01 NOTE — Care Management Important Message (Signed)
Important Message  Patient Details  Name: Jennifer Nielsen MRN: [id number] Date of Birth: 1924/07/31   Medicare Important Message Given:  Yes-second notification given    Camillo Flaming 01/01/2015, 12:21 [place] Message  Patient Details  Name: Jennifer Nielsen MRN: [id number] Date of Birth: 25-Sep-1924   Medicare Important Message Given:  Yes-second notification given    Camillo Flaming 01/01/2015, 12:21 PM

## 2015-01-01 NOTE — Care Management Note (Signed)
Case Management Note  Patient Details  Name: Jennifer Nielsen MRN: [id number] Date of Birth: [birth year]  Subjective/Objective:   CHF. From SNF.                 Action/Plan:d/c plan return to SNF.   Expected Discharge Date:                  Expected Discharge Plan:  [place]  In-House Referral:     Discharge planning Services  CM Consult  Post Acute Care Choice:    Choice offered to:     DME Arranged:    DME Agency:     HH Arranged:    [place] Agency:     Status of Service:  Completed, signed off  Medicare Important Message Given:  Yes-second notification given Date Medicare IM Given:    Medicare IM give by:    Date Additional Medicare IM Given:    Additional Medicare Important Message give by:     If discussed at [place] of Stay Meetings, dates discussed:    Additional Comments:  Dessa Phi, RN 01/01/2015, 3:56 PM

## 2015-01-01 NOTE — Progress Notes (Signed)
Patient ID: Jennifer Nielsen, female   DOB: Apr 15, 1925, 81 y.o.   MRN: [id number] [facility] PROGRESS NOTE  Aliviana Burdell [id number] DOB: December 23, 1924 DOA: 12/28/2014 PCP: Marylou Flesher, MD  Brief narrative:    81 y.o. female with a past medical history of chronic respiratory failure on 2 L of home oxygen, chronic diastolic CHF, COPD, recent hospitalization 11/07/14-11/20/14 for decompensated CHF and was discharged with Lasix 40 mg twice a day. She presented to [facility] hospital on 12/28/2014 with worsening shortness of breath, cough and wheezing for past 24 hours prior to the admission. She also reported 30 pound weight gain since prior hospitalization. Her chest x-ray study was consistent with CHF.  Barrier to discharge: Change IV Lasix to by mouth Lasix. Will observe for next 24 hours. Anticipate discharge to skilled nursing facility 01/02/2015.   Assessment/Plan:    Principal Problem: Acute on chronic respiratory failure with hypoxia / Acute on chronic decompensated diastolic CHF  - Hypoxia likely secondary to acute decompensated diastolic CHF as well as COPD exacerbation possible healthcare associated pneumonia.  - 2-D echo in May 2016 with preserved ejection fraction. The study was technically inadequate to evaluate left ventricular diastolic function. BNP only mildly elevated at 185.5. - the following is the weight trend in past 24-48 hours Filed Weights   12/30/14 0412 12/31/14 0349 01/01/15 0441  Weight: 94.2 kg (207 lb 10.8 oz) 93.8 kg (206 lb 12.7 oz) 92.6 kg (204 lb 2.3 oz)  - Stop IV Lasix and changes to by mouth per home regimen. - Replete electrolytes as needed.   Active Problems: Sepsis secondary to healthcare associated pneumonia / Leukocytosis  - Sepsis criteria met today with hypothermia, T 96.7 F, tachycardia, tachypnea, leukocytosis. Source of infection likely healthcare associated pneumonia considering hospitalization in early June of this year. - Sepsis workup  initiated however patient refused blood work - She was on vancomycin and Zosyn which was switched to more narrow regimen, azithromycin and Rocephin on 12/31/2014. - Blood cultures to date are negative  Acute COPD exacerbation - Respiratory status remains stable - Patient will get Solu-Medrol 1 dose today and from tomorrow we will start prednisone 50 mg. We will taper down by 10 mg a day on discharge. - Continue Atrovent and Xopenex every 6 hours scheduled  Steroid induced hyperglycemia - Continue sliding scale insulin  Anemia of chronic disease  - Likely secondary to chronic anticoagulation - Hemoglobin stable   Metabolic alkalosis - Likely from Lasix  Dementia - Zyprexa discontinued secondary to somnolence.  Hyperkalemia / Hypokalemia  - Secondary to supplementation because patient is on Lasix. - Potassium WNL  Atrial fibrillation - CHADS vasc score at least 4 - Continue anticoagulation with xarelto  - Continue Cardizem 180 mg daily   DVT Prophylaxis - On chronic anticoagulation with xarelto     Code Status: DNR/DNI Family Communication:  plan of care discussed with the patient Disposition Plan: to SNF 01/02/2015   IV access:  Peripheral IV  Procedures and diagnostic studies:    Dg Chest Port 1 View 12-Jan-2015  1. Similar to slightly increased interstitial pulmonary edema. 2. Otherwise unchanged cardiomegaly and left larger than right layering effusions with associated bibasilar atelectasis.   Electronically Signed   By: Malachy Moan M.D.   On: 12-Jan-2015 08:20   Dg Chest Port 1 View 12/28/2014  1. Congestive heart failure.   Electronically Signed   By: Signa Kell M.D.   On: 12/28/2014 18:26   Medical Consultants:  None  Other Consultants:  Physical therapy Nutrition  IAnti-Infectives:   Vancomycin 12/28/14 --> 12/31/2014 Zosyn 12/28/14 --> 12/31/2014 Azithromycin and rocephin 01/01/2015 -->    Verline Kong, MD  [facility] Pager  [phone]  Time spent in minutes: 25 minutes  If 7PM-7AM, please contact night-coverage [email or web address] Password [email or web address] 01/01/2015, 12:37 PM   LOS: 4 days    HPI/Subjective: No acute overnight events. No respiratory distress.  Objective: Filed Vitals:   12/31/14 2043 01/01/15 0411 01/01/15 0441 01/01/15 0738  BP:  117/67    Pulse:  67    Temp:  97.4 F (36.3 C)    TempSrc:  Oral    Resp:  35    Height:      Weight:   92.6 kg (204 lb 2.3 oz)   SpO2: 98% 99%  99%    Intake/Output Summary (Last 24 hours) at 01/01/15 1237 Last data filed at 01/01/15 0600  Gross per 24 hour  Intake    220 ml  Output   2260 ml  Net  -2040 ml    Exam:   General:  Pt is not in distress  Cardiovascular: tachycardic, appreciate S1-S2  Respiratory: Bilateral air entry, no wheezing  Abdomen: Nontender, nondistended abdomen, appreciate bowel sounds  Extremities: +1 lower extremity edema, no erythema, pulses palpable  Neuro: No focal neurological deficits  Data Reviewed: Basic Metabolic Panel:  Recent Labs Lab 12/28/14 1742 12/28/14 1852 12/28/14 2150 12/29/14 0358 12/30/14 0413 12/31/14 0345 01/01/15 0753  NA 137 134*  --  139 141 140 143  K 6.2* 6.2* 5.4* 5.1 3.0* 4.1 3.8  CL 94* 94*  --  94* 92* 93* 93*  CO2 34*  --   --  37* 39* 36* 44*  GLUCOSE 137* 131*  --  149* 151* 154* 129*  BUN 31* 36*  --  29* 37* 46* 49*  CREATININE 0.80 1.00  --  0.72 0.74 0.78 0.72  CALCIUM 9.5  --   --  9.2 8.3* 8.3* 8.4*   Liver Function Tests:  Recent Labs Lab 12/28/14 1742  AST 32  ALT 34  ALKPHOS 92  BILITOT 0.9  PROT 7.7  ALBUMIN 3.5   No results for input(s): LIPASE, AMYLASE in the last 168 hours. No results for input(s): AMMONIA in the last 168 hours. CBC:  Recent Labs Lab 12/28/14 1742 12/28/14 1852 12/29/14 0358 01/01/15 0753  WBC 16.2*  --  11.8* 11.2*  NEUTROABS 14.0*  --   --   --   HGB 10.9* 12.6 9.8* 10.3*  HCT 37.4 37.0 33.1* 35.3*  MCV 102.7*  --  102.5*  102.0*  PLT 441*  --  372 379   Cardiac Enzymes: No results for input(s): CKTOTAL, CKMB, CKMBINDEX, TROPONINI in the last 168 hours. BNP: Invalid input(s): POCBNP CBG:  Recent Labs Lab 12/31/14 2012 12/31/14 2341 01/01/15 0407 01/01/15 0737 01/01/15 1152  GLUCAP 178* 163* 114* 125* 115*   Blood culture (routine x 2)     Status: None (Preliminary result)   Collection Time: 12/28/14  6:45 PM  Result Value Ref Range Status   Specimen Description BLOOD RIGHT ANTECUBITAL  Final   Culture   Final    NO GROWTH 1 DAY Performed at [facility]    Report Status PENDING  Incomplete  Blood culture (routine x 2)     Status: None (Preliminary result)   Collection Time: 12/28/14  6:48 PM  Result Value Ref Range Status   Specimen Description BLOOD LEFT ANTECUBITAL  Final   Culture   Final    NO GROWTH 1 DAY Performed at [facility]    Report Status PENDING  Incomplete  MRSA PCR Screening     Status: None   Collection Time: 12/28/14 10:00 PM  Result Value Ref Range Status   MRSA by PCR NEGATIVE NEGATIVE Final     . azithromycin  500 mg Intravenous Q24H  . cefTRIAXone (ROCEPHIN)  IV  1 g Intravenous Q24H  . diltiazem  180 mg Oral Daily  . feeding supplement (ENSURE ENLIVE)  237 mL Oral Q24H  . furosemide  40 mg Oral BID  . insulin aspart  0-15 Units Subcutaneous 6 times per day  . ipratropium  0.5 mg Nebulization TID  . levalbuterol  0.63 mg Nebulization TID  . methylPREDNISolone (SOLU-MEDROL) injection  40 mg Intravenous Q24H  . metoprolol  2.5 mg Intravenous 4 times per day  . rivaroxaban  20 mg Oral Q breakfast

## 2015-01-02 DIAGNOSIS — J189 Pneumonia, unspecified organism: Secondary | ICD-10-CM | POA: Diagnosis not present

## 2015-01-02 DIAGNOSIS — I4891 Unspecified atrial fibrillation: Secondary | ICD-10-CM | POA: Diagnosis not present

## 2015-01-02 DIAGNOSIS — N390 Urinary tract infection, site not specified: Secondary | ICD-10-CM | POA: Diagnosis present

## 2015-01-02 DIAGNOSIS — R739 Hyperglycemia, unspecified: Secondary | ICD-10-CM | POA: Diagnosis not present

## 2015-01-02 DIAGNOSIS — F0390 Unspecified dementia without behavioral disturbance: Secondary | ICD-10-CM | POA: Diagnosis present

## 2015-01-02 DIAGNOSIS — Z5189 Encounter for other specified aftercare: Secondary | ICD-10-CM | POA: Diagnosis not present

## 2015-01-02 DIAGNOSIS — I482 Chronic atrial fibrillation: Secondary | ICD-10-CM | POA: Diagnosis not present

## 2015-01-02 DIAGNOSIS — J9621 Acute and chronic respiratory failure with hypoxia: Secondary | ICD-10-CM | POA: Diagnosis not present

## 2015-01-02 DIAGNOSIS — R0602 Shortness of breath: Secondary | ICD-10-CM | POA: Diagnosis not present

## 2015-01-02 DIAGNOSIS — F39 Unspecified mood [affective] disorder: Secondary | ICD-10-CM | POA: Diagnosis not present

## 2015-01-02 DIAGNOSIS — E785 Hyperlipidemia, unspecified: Secondary | ICD-10-CM | POA: Diagnosis present

## 2015-01-02 DIAGNOSIS — Z881 Allergy status to other antibiotic agents status: Secondary | ICD-10-CM | POA: Diagnosis not present

## 2015-01-02 DIAGNOSIS — R4020 Unspecified coma: Secondary | ICD-10-CM | POA: Diagnosis not present

## 2015-01-02 DIAGNOSIS — J441 Chronic obstructive pulmonary disease with (acute) exacerbation: Secondary | ICD-10-CM | POA: Diagnosis not present

## 2015-01-02 DIAGNOSIS — Z6833 Body mass index (BMI) 33.0-33.9, adult: Secondary | ICD-10-CM | POA: Diagnosis not present

## 2015-01-02 DIAGNOSIS — R069 Unspecified abnormalities of breathing: Secondary | ICD-10-CM | POA: Diagnosis not present

## 2015-01-02 DIAGNOSIS — G9340 Encephalopathy, unspecified: Secondary | ICD-10-CM | POA: Diagnosis present

## 2015-01-02 DIAGNOSIS — Z87891 Personal history of nicotine dependence: Secondary | ICD-10-CM | POA: Diagnosis not present

## 2015-01-02 DIAGNOSIS — R1030 Lower abdominal pain, unspecified: Secondary | ICD-10-CM | POA: Diagnosis not present

## 2015-01-02 DIAGNOSIS — Z66 Do not resuscitate: Secondary | ICD-10-CM | POA: Diagnosis present

## 2015-01-02 DIAGNOSIS — I509 Heart failure, unspecified: Secondary | ICD-10-CM | POA: Diagnosis not present

## 2015-01-02 DIAGNOSIS — Z79899 Other long term (current) drug therapy: Secondary | ICD-10-CM | POA: Diagnosis not present

## 2015-01-02 DIAGNOSIS — E876 Hypokalemia: Secondary | ICD-10-CM | POA: Diagnosis not present

## 2015-01-02 DIAGNOSIS — E46 Unspecified protein-calorie malnutrition: Secondary | ICD-10-CM | POA: Diagnosis not present

## 2015-01-02 DIAGNOSIS — Z7901 Long term (current) use of anticoagulants: Secondary | ICD-10-CM | POA: Diagnosis not present

## 2015-01-02 DIAGNOSIS — I5031 Acute diastolic (congestive) heart failure: Secondary | ICD-10-CM | POA: Diagnosis not present

## 2015-01-02 DIAGNOSIS — M6281 Muscle weakness (generalized): Secondary | ICD-10-CM | POA: Diagnosis not present

## 2015-01-02 DIAGNOSIS — J9601 Acute respiratory failure with hypoxia: Secondary | ICD-10-CM | POA: Diagnosis not present

## 2015-01-02 DIAGNOSIS — D638 Anemia in other chronic diseases classified elsewhere: Secondary | ICD-10-CM | POA: Diagnosis not present

## 2015-01-02 DIAGNOSIS — I5033 Acute on chronic diastolic (congestive) heart failure: Secondary | ICD-10-CM | POA: Diagnosis not present

## 2015-01-02 DIAGNOSIS — Z8249 Family history of ischemic heart disease and other diseases of the circulatory system: Secondary | ICD-10-CM | POA: Diagnosis not present

## 2015-01-02 DIAGNOSIS — B965 Pseudomonas (aeruginosa) (mallei) (pseudomallei) as the cause of diseases classified elsewhere: Secondary | ICD-10-CM | POA: Diagnosis present

## 2015-01-02 DIAGNOSIS — E875 Hyperkalemia: Secondary | ICD-10-CM | POA: Diagnosis not present

## 2015-01-02 DIAGNOSIS — Z9981 Dependence on supplemental oxygen: Secondary | ICD-10-CM | POA: Diagnosis not present

## 2015-01-02 DIAGNOSIS — E872 Acidosis: Secondary | ICD-10-CM | POA: Diagnosis present

## 2015-01-02 DIAGNOSIS — J9622 Acute and chronic respiratory failure with hypercapnia: Secondary | ICD-10-CM | POA: Diagnosis not present

## 2015-01-02 DIAGNOSIS — J449 Chronic obstructive pulmonary disease, unspecified: Secondary | ICD-10-CM | POA: Diagnosis not present

## 2015-01-02 DIAGNOSIS — R278 Other lack of coordination: Secondary | ICD-10-CM | POA: Diagnosis not present

## 2015-01-02 LAB — GLUCOSE, CAPILLARY
GLUCOSE-CAPILLARY: 115 mg/dL — AB (ref 65–99)
GLUCOSE-CAPILLARY: 119 mg/dL — AB (ref 65–99)
Glucose-Capillary: 109 mg/dL — ABNORMAL HIGH (ref 65–99)
Glucose-Capillary: 225 mg/dL — ABNORMAL HIGH (ref 65–99)

## 2015-01-02 MED ORDER — OLANZAPINE 5 MG PO TABS: 5.0000 mg | ORAL_TABLET | Freq: Every day | ORAL | Status: DC

## 2015-01-02 MED ORDER — CETYLPYRIDINIUM CHLORIDE 0.05 % MT LIQD: 7.0000 mL | Freq: Two times a day (BID) | OROMUCOSAL | Status: DC

## 2015-01-02 MED ORDER — GLUCERNA SHAKE PO LIQD: 237.0000 mL | Freq: Three times a day (TID) | ORAL | Status: DC

## 2015-01-02 NOTE — Discharge Summary (Signed)
Physician Discharge Summary  Jennifer Nielsen ACZ:[id number] DOB: [birth year] DOA: 12/28/2014  PCP: [name], MD  Admit date: 12/28/2014 Discharge date: 01/02/2015  Recommendations for Outpatient Follow-up:  1. No new changes in medications on discharge 2. CBC and BMP check per SNF protocol   Discharge Diagnoses:  Principal Problem:   Acute on chronic respiratory failure Active Problems:   Long term current use of anticoagulant therapy   Atrial fibrillation   COPD exacerbation   Dementia   Hyperkalemia   HCAP (healthcare-associated pneumonia)   Acute respiratory failure   Acute on chronic diastolic CHF (congestive heart failure)   Steroid-induced hyperglycemia    Discharge Condition: stable   Diet recommendation: as tolerated   History of present illness:  81 y.o. female with a past medical history of chronic respiratory failure on 2 L of home oxygen, chronic diastolic CHF, COPD, recent hospitalization 11/07/14-11/20/14 for decompensated CHF and was discharged with Lasix 40 mg twice a day. She presented to [facility] on 12/28/2014 with worsening shortness of breath, cough and wheezing for past 24 hours prior to the admission. She also reported 30 pound weight gain since prior hospitalization. Her chest x-ray study was consistent with CHF.  Hospital Course:  Principal Problem: Acute on chronic respiratory failure with hypoxia / Acute on chronic decompensated diastolic CHF  - Hypoxia likely secondary to acute decompensated diastolic CHF as well as COPD exacerbation possible healthcare associated pneumonia.  - 2-D echo in May 2016 with preserved ejection fraction. The study was technically inadequate to evaluate left ventricular diastolic function. BNP only mildly elevated at 185.5. - Weight trend in past 24-48 hours: 93.8 kg --> 91.8 kg - Patient will continue by mouth Lasix and discharge as per prior home regimen - Continue to supplement potassium`  Active  Problems: Sepsis secondary to healthcare associated pneumonia / Leukocytosis  - Sepsis criteria met today with hypothermia, T 96.7 F, tachycardia, tachypnea, leukocytosis. Source of infection likely healthcare associated pneumonia considering hospitalization in early June of this year. - Sepsis workup initiated however patient refused blood work - She was on vancomycin and Zosyn which was switched to more narrow regimen, azithromycin and Rocephin on 12/31/2014. - Blood cultures to date are negative - Patient does not need antibodies on discharge.  Acute COPD exacerbation - Respiratory status stable - May continue as needed nebulizer treatments - No need for stairs on discharge. She was on Solu-Medrol at the time of the admission and this was slowly tapered down and we changed it to prednisone 50 mg one dose today.  Steroid induced hyperglycemia - SSI due to pt being on prednisone in hospital   Anemia of chronic disease  - Likely secondary to chronic anticoagulation - Hemoglobin stable   Metabolic alkalosis - Likely from Lasix  Dementia - Zyprexa discontinued secondary to somnolence. - Her mental status is better so she can resume it on discharge    Hyperkalemia / Hypokalemia  - Secondary to supplementation because patient is on Lasix. - Potassium WNL  Atrial fibrillation - CHADS vasc score at least 4 - Continue anticoagulation with xarelto  - Continue Cardizem 180 mg daily   DVT Prophylaxis - Continue anticoagulation with xarelto     Code Status: DNR/DNI Family Communication: plan of care discussed with the patient  IV access:  Peripheral IV  Procedures and diagnostic studies:   Dg Chest Port 1 View 12/29/2014 1. Similar to slightly increased interstitial pulmonary edema. 2. Otherwise unchanged cardiomegaly and left larger than right layering effusions  with associated bibasilar atelectasis. Electronically Signed By: Jacqulynn Cadet M.D. On: 12/29/2014  08:20   Dg Chest Port 1 View 12/28/2014 1. Congestive heart failure. Electronically Signed By: Kerby Moors M.D. On: 12/28/2014 18:26   Medical Consultants:  None   Other Consultants:  Physical therapy Nutrition  IAnti-Infectives:   Vancomycin 12/28/14 --> 12/31/2014 Zosyn 12/28/14 --> 12/31/2014 Azithromycin and rocephin 01/01/2015 --> 01/02/2015      Signed:  Leisa Lenz, MD  [facility] 01/02/2015, 9:34 AM  Pager #: [phone]  Time spent in minutes: more than 30 minutes  Discharge Exam: Filed Vitals:   01/02/15 0906  BP: 129/78  Pulse: 86  Temp:   Resp: 42   Filed Vitals:   01/02/15 0430 01/02/15 0630 01/02/15 0906 01/02/15 0919  BP: 123/73 141/75 129/78   Pulse: 107 107 86   Temp: 97.7 F (36.5 C)     TempSrc: Oral     Resp: 24  42   Height:      Weight: 91.8 kg (202 lb 6.1 oz)     SpO2: 100%   97%    General: Pt is alert, not in acute distress Cardiovascular: Regular rate and rhythm, S1/S2 appreciated  Respiratory: No wheezing, no crackles, no rhonchi Abdominal: Soft, non tender, non distended, bowel sounds +, no guarding Extremities: no cyanosis, pulses palpable bilaterally DP and PT Neuro: Grossly nonfocal  Discharge Instructions  Discharge Instructions    Call MD for:  difficulty breathing, headache or visual disturbances    Complete by:  As directed      Call MD for:  persistant nausea and vomiting    Complete by:  As directed      Call MD for:  severe uncontrolled pain    Complete by:  As directed      Diet - low sodium heart healthy    Complete by:  As directed      Increase activity slowly    Complete by:  As directed             Medication List    STOP taking these medications        predniSONE 10 MG tablet  Commonly known as:  DELTASONE      TAKE these medications        AMBULATORY NON FORMULARY MEDICATION  Medication Name: Med Pass, 120 mLs by mouth twice daily     antiseptic oral rinse 0.05 %  Liqd solution  Commonly known as:  CPC / CETYLPYRIDINIUM CHLORIDE 0.05%  7 mLs by Mouth Rinse route 2 times daily at 12 noon and 4 pm.     budesonide-formoterol 160-4.5 MCG/ACT inhaler  Commonly known as:  SYMBICORT  Inhale 2 puffs into the lungs 2 (two) times daily.     CALTRATE 600+D PO  Take 1 tablet by mouth every morning. For calcium supplement     DECUBI-VITE Caps  Take 1 capsule by mouth daily.     diltiazem 180 MG 24 hr capsule  Commonly known as:  CARDIZEM CD  Take 1 capsule (180 mg total) by mouth daily. For A-Fib     DSS 100 MG Caps  Take 100 mg by mouth daily. For constipation     feeding supplement (GLUCERNA SHAKE) Liqd  Take 237 mLs by mouth 3 (three) times daily between meals.     furosemide 40 MG tablet  Commonly known as:  LASIX  Take 40 mg by mouth 2 (two) times daily.     ipratropium-albuterol 0.5-2.5 (3) MG/3ML  Soln  Commonly known as:  DUONEB  21m nebulized 4 times daily scheduled and every 4hrs as needed for wheezing for 3 days. And then every 4hours as needed for wheezing.     OLANZapine 5 MG tablet  Commonly known as:  ZYPREXA  Take 1 tablet (5 mg total) by mouth at bedtime.     OXYGEN  Inhale 2 L into the lungs continuous.     potassium chloride SA 20 MEQ tablet  Commonly known as:  K-DUR,KLOR-CON  Take 1 tablet (20 mEq total) by mouth daily.     SYSTANE OP  Place 1 drop into both eyes 2 (two) times daily. For dry eyes     TYLENOL 500 MG tablet  Generic drug:  acetaminophen  Take 500 mg by mouth 3 (three) times daily.     XARELTO 20 MG Tabs tablet  Generic drug:  rivaroxaban  Take 20 mg by mouth daily. For DVT           Follow-up Information    Follow up with DAY,JAMES, MD. Schedule an appointment as soon as possible for a visit in 1 week.   Specialty:  Internal Medicine   Why:  Follow up appt after recent hospitalization       The results of significant diagnostics from this hospitalization (including imaging, microbiology,  ancillary and laboratory) are listed below for reference.    Significant Diagnostic Studies: Dg Chest Port 1 View  12/29/2014   CLINICAL DATA:  81year old female with pneumonia and history of atrial fibrillation  EXAM: PORTABLE CHEST - 1 VIEW  COMPARISON:  Prior chest x-ray 12/28/2014  FINDINGS: Stable cardiomegaly. Atherosclerotic calcifications again noted in the transverse aorta. Pulmonary vascular congestion with perhaps mild interstitial edema. Findings are slightly progressed compared to prior. Bilateral left larger than right layering pleural effusions and associated bibasilar atelectasis. Infiltrate difficult to exclude in the left base. No acute osseous abnormality.  IMPRESSION: 1. Similar to slightly increased interstitial pulmonary edema. 2. Otherwise unchanged cardiomegaly and left larger than right layering effusions with associated bibasilar atelectasis.   Electronically Signed   By: HJacqulynn CadetM.D.   On: 12/29/2014 08:20   Dg Chest Port 1 View  12/28/2014   CLINICAL DATA:  Respiratory distress.  EXAM: PORTABLE CHEST - 1 VIEW  COMPARISON:  11/14/2014  FINDINGS: There is cardiac enlargement. Bilateral pleural effusions are identified left greater than right. There is mild diffuse interstitial edema. Atelectasis is identified overlying the effusions.  IMPRESSION: 1. Congestive heart failure.   Electronically Signed   By: TKerby MoorsM.D.   On: 12/28/2014 18:26    Microbiology: Recent Results (from the past 240 hour(s))  Blood culture (routine x 2)     Status: None (Preliminary result)   Collection Time: 12/28/14  6:45 PM  Result Value Ref Range Status   Specimen Description BLOOD RIGHT ANTECUBITAL  Final   Special Requests BOTTLES DRAWN AEROBIC AND ANAEROBIC 10CC EA  Final   Culture   Final    NO GROWTH 3 DAYS [facility]   Report Status PENDING  Incomplete  Blood culture (routine x 2)     Status: None (Preliminary result)   Collection Time:  12/28/14  6:48 PM  Result Value Ref Range Status   Specimen Description BLOOD LEFT ANTECUBITAL  Final   Special Requests BOTTLES DRAWN AEROBIC AND ANAEROBIC 5CC EA  Final   Culture   Final    NO GROWTH 3 DAYS [facility]    Report Status PENDING  Incomplete  MRSA PCR Screening     Status: None   Collection Time: 12/28/14 10:00 PM  Result Value Ref Range Status   MRSA by PCR NEGATIVE NEGATIVE Final    Comment:        The GeneXpert MRSA Assay (FDA approved for NASAL specimens only), is one component of a comprehensive MRSA colonization surveillance program. It is not intended to diagnose MRSA infection nor to guide or monitor treatment for MRSA infections.      Labs: Basic Metabolic Panel:  Recent Labs Lab 12/28/14 1742 12/28/14 1852 12/28/14 2150 12/29/14 0358 12/30/14 0413 12/31/14 0345 01/01/15 0753  NA 137 134*  --  139 141 140 143  K 6.2* 6.2* 5.4* 5.1 3.0* 4.1 3.8  CL 94* 94*  --  94* 92* 93* 93*  CO2 34*  --   --  37* 39* 36* 44*  GLUCOSE 137* 131*  --  149* 151* 154* 129*  BUN 31* 36*  --  29* 37* 46* 49*  CREATININE 0.80 1.00  --  0.72 0.74 0.78 0.72  CALCIUM 9.5  --   --  9.2 8.3* 8.3* 8.4*   Liver Function Tests:  Recent Labs Lab 12/28/14 1742  AST 32  ALT 34  ALKPHOS 92  BILITOT 0.9  PROT 7.7  ALBUMIN 3.5   No results for input(s): LIPASE, AMYLASE in the last 168 hours. No results for input(s): AMMONIA in the last 168 hours. CBC:  Recent Labs Lab 12/28/14 1742 12/28/14 1852 12/29/14 0358 01/01/15 0753  WBC 16.2*  --  11.8* 11.2*  NEUTROABS 14.0*  --   --   --   HGB 10.9* 12.6 9.8* 10.3*  HCT 37.4 37.0 33.1* 35.3*  MCV 102.7*  --  102.5* 102.0*  PLT 441*  --  372 379   Cardiac Enzymes: No results for input(s): CKTOTAL, CKMB, CKMBINDEX, TROPONINI in the last 168 hours. BNP: BNP (last 3 results)  Recent Labs  11/16/14 0455 12/28/14 1743 12/29/14 0358  BNP 225.8* 257.2* 185.5*    ProBNP (last 3  results)  Recent Labs  02/22/14 1417 06/17/14 1050  PROBNP 2244.0* 170.0*    CBG:  Recent Labs Lab 01/01/15 1605 01/01/15 2002 01/02/15 0030 01/02/15 0426 01/02/15 0738  GLUCAP 291* 200* 119* 115* 109*

## 2015-01-02 NOTE — Progress Notes (Signed)
Patient is set to discharge back to [facility] today. Patient & daughter, [place] Cellar aware. Discharge packet given to RN, Emily/Susan. [facility] called for transport to pickup at 1:30pm.     Raynaldo Opitz, [place] Worker cell #: [phone]

## 2015-01-02 NOTE — Progress Notes (Signed)
Report called to Guy Franco, RN at [facility].

## 2015-01-02 NOTE — Progress Notes (Signed)
Discharged to [facility] via [employer].. Daughter present.  Vital signs stable.  No complaint.  Foley intact per Dr. Montine Circle.

## 2015-01-02 NOTE — Care Management Note (Signed)
Case Management Note  Patient Details  Name: Jennifer Nielsen MRN: [id number] Date of Birth: 06/20/1924  Subjective/Objective:                    Action/Plan:d/c SNF   Expected Discharge Date:                  Expected Discharge Plan:  [place]  In-House Referral:     Discharge planning Services  CM Consult  Post Acute Care Choice:    Choice offered to:     DME Arranged:    DME Agency:     HH Arranged:    [place] Agency:     Status of Service:  Completed, signed off  Medicare Important Message Given:  Yes-second notification given Date Medicare IM Given:    Medicare IM give by:    Date Additional Medicare IM Given:    Additional Medicare Important Message give by:     If discussed at [place] of Stay Meetings, dates discussed:  01/02/15  Additional Comments:  Dessa Phi, RN 01/02/2015, 10:14 AM

## 2015-01-02 NOTE — Discharge Instructions (Signed)
Heart Failure °Heart failure is a condition in which the heart has trouble pumping blood. This means your heart does not pump blood efficiently for your body to work well. In some cases of heart failure, fluid may back up into your lungs or you may have swelling (edema) in your lower legs. Heart failure is usually a long-term (chronic) condition. It is important for you to take good care of yourself and follow your health care provider's treatment plan. °CAUSES  °Some health conditions can cause heart failure. Those health conditions include: °· High blood pressure (hypertension). Hypertension causes the heart muscle to work harder than normal. When pressure in the blood vessels is high, the heart needs to pump (contract) with more force in order to circulate blood throughout the body. High blood pressure eventually causes the heart to become stiff and weak. °· Coronary artery disease (CAD). CAD is the buildup of cholesterol and fat (plaque) in the arteries of the heart. The blockage in the arteries deprives the heart muscle of oxygen and blood. This can cause chest pain and may lead to a heart attack. High blood pressure can also contribute to CAD. °· Heart attack (myocardial infarction). A heart attack occurs when one or more arteries in the heart become blocked. The loss of oxygen damages the muscle tissue of the heart. When this happens, part of the heart muscle dies. The injured tissue does not contract as well and weakens the heart's ability to pump blood. °· Abnormal heart valves. When the heart valves do not open and close properly, it can cause heart failure. This makes the heart muscle pump harder to keep the blood flowing. °· Heart muscle disease (cardiomyopathy or myocarditis). Heart muscle disease is damage to the heart muscle from a variety of causes. These can include drug or alcohol abuse, infections, or unknown reasons. These can increase the risk of heart failure. °· Lung disease. Lung disease  makes the heart work harder because the lungs do not work properly. This can cause a strain on the heart, leading it to fail. °· Diabetes. Diabetes increases the risk of heart failure. High blood sugar contributes to high fat (lipid) levels in the blood. Diabetes can also cause slow damage to tiny blood vessels that carry important nutrients to the heart muscle. When the heart does not get enough oxygen and food, it can cause the heart to become weak and stiff. This leads to a heart that does not contract efficiently. °· Other conditions can contribute to heart failure. These include abnormal heart rhythms, thyroid problems, and low blood counts (anemia). °Certain unhealthy behaviors can increase the risk of heart failure, including: °· Being overweight. °· Smoking or chewing tobacco. °· Eating foods high in fat and cholesterol. °· Abusing illicit drugs or alcohol. °· Lacking physical activity. °SYMPTOMS  °Heart failure symptoms may vary and can be hard to detect. Symptoms may include: °· Shortness of breath with activity, such as climbing stairs. °· Persistent cough. °· Swelling of the feet, ankles, legs, or abdomen. °· Unexplained weight gain. °· Difficulty breathing when lying flat (orthopnea). °· Waking from sleep because of the need to sit up and get more air. °· Rapid heartbeat. °· Fatigue and loss of energy. °· Feeling light-headed, dizzy, or close to fainting. °· Loss of appetite. °· Nausea. °· Increased urination during the night (nocturia). °DIAGNOSIS  °A diagnosis of heart failure is based on your history, symptoms, physical examination, and diagnostic tests. Diagnostic tests for heart failure may include: °·   Echocardiography. °· Electrocardiography. °· Chest X-ray. °· Blood tests. °· Exercise stress test. °· Cardiac angiography. °· Radionuclide scans. °TREATMENT  °Treatment is aimed at managing the symptoms of heart failure. Medicines, behavioral changes, or surgical intervention may be necessary to  treat heart failure. °· Medicines to help treat heart failure may include: °¨ Angiotensin-converting enzyme (ACE) inhibitors. This type of medicine blocks the effects of a blood protein called angiotensin-converting enzyme. ACE inhibitors relax (dilate) the blood vessels and help lower blood pressure. °¨ Angiotensin receptor blockers (ARBs). This type of medicine blocks the actions of a blood protein called angiotensin. Angiotensin receptor blockers dilate the blood vessels and help lower blood pressure. °¨ Water pills (diuretics). Diuretics cause the kidneys to remove salt and water from the blood. The extra fluid is removed through urination. This loss of extra fluid lowers the volume of blood the heart pumps. °¨ Beta blockers. These prevent the heart from beating too fast and improve heart muscle strength. °¨ Digitalis. This increases the force of the heartbeat. °· Healthy behavior changes include: °¨ Obtaining and maintaining a healthy weight. °¨ Stopping smoking or chewing tobacco. °¨ Eating heart-healthy foods. °¨ Limiting or avoiding alcohol. °¨ Stopping illicit drug use. °¨ Physical activity as directed by your health care provider. °· Surgical treatment for heart failure may include: °¨ A procedure to open blocked arteries, repair damaged heart valves, or remove damaged heart muscle tissue. °¨ A pacemaker to improve heart muscle function and control certain abnormal heart rhythms. °¨ An internal cardioverter defibrillator to treat certain serious abnormal heart rhythms. °¨ A left ventricular assist device (LVAD) to assist the pumping ability of the heart. °HOME CARE INSTRUCTIONS  °· Take medicines only as directed by your health care provider. Medicines are important in reducing the workload of your heart, slowing the progression of heart failure, and improving your symptoms. °¨ Do not stop taking your medicine unless directed by your health care provider. °¨ Do not skip any dose of medicine. °¨ Refill your  prescriptions before you run out of medicine. Your medicines are needed every day. °· Engage in moderate physical activity if directed by your health care provider. Moderate physical activity can benefit some people. The elderly and people with severe heart failure should consult with a health care provider for physical activity recommendations. °· Eat heart-healthy foods. Food choices should be free of trans fat and low in saturated fat, cholesterol, and salt (sodium). Healthy choices include fresh or frozen fruits and vegetables, fish, lean meats, legumes, fat-free or low-fat dairy products, and whole grain or high fiber foods. Talk to a dietitian to learn more about heart-healthy foods. °· Limit sodium if directed by your health care provider. Sodium restriction may reduce symptoms of heart failure in some people. Talk to a dietitian to learn more about heart-healthy seasonings. °· Use healthy cooking methods. Healthy cooking methods include roasting, grilling, broiling, baking, poaching, steaming, or stir-frying. Talk to a dietitian to learn more about healthy cooking methods. °· Limit fluids if directed by your health care provider. Fluid restriction may reduce symptoms of heart failure in some people. °· Weigh yourself every day. Daily weights are important in the early recognition of excess fluid. You should weigh yourself every morning after you urinate and before you eat breakfast. Wear the same amount of clothing each time you weigh yourself. Record your daily weight. Provide your health care provider with your weight record. °· Monitor and record your blood pressure if directed by your health care   provider.  Check your pulse if directed by your health care provider.  Lose weight if directed by your health care provider. Weight loss may reduce symptoms of heart failure in some people.  Stop smoking or chewing tobacco. Nicotine makes your heart work harder by causing your blood vessels to constrict.  Do not use nicotine gum or patches before talking to your health care provider.  Keep all follow-up visits as directed by your health care provider. This is important.  Limit alcohol intake to no more than 1 drink per day for nonpregnant women and 2 drinks per day for men. One drink equals 12 ounces of beer, 5 ounces of wine, or 1 ounces of hard liquor. Drinking more than that is harmful to your heart. Tell your health care provider if you drink alcohol several times a week. Talk with your health care provider about whether alcohol is safe for you. If your heart has already been damaged by alcohol or you have severe heart failure, drinking alcohol should be stopped completely.  Stop illicit drug use.  Stay up-to-date with immunizations. It is especially important to prevent respiratory infections through current pneumococcal and influenza immunizations.  Manage other health conditions such as hypertension, diabetes, thyroid disease, or abnormal heart rhythms as directed by your health care provider.  Learn to manage stress.  Plan rest periods when fatigued.  Learn strategies to manage high temperatures. If the weather is extremely hot:  Avoid vigorous physical activity.  Use air conditioning or fans or seek a cooler location.  Avoid caffeine and alcohol.  Wear loose-fitting, lightweight, and light-colored clothing.  Learn strategies to manage cold temperatures. If the weather is extremely cold:  Avoid vigorous physical activity.  Layer clothes.  Wear mittens or gloves, a hat, and a scarf when going outside.  Avoid alcohol.  Obtain ongoing education and support as needed.  Participate in or seek rehabilitation as needed to maintain or improve independence and quality of life. SEEK MEDICAL CARE IF:   Your weight increases by 03 lb/1.4 kg in 1 day or 05 lb/2.3 kg in a week.  You have increasing shortness of breath that is unusual for you.  You are unable to participate in  your usual physical activities.  You tire easily.  You cough more than normal, especially with physical activity.  You have any or more swelling in areas such as your hands, feet, ankles, or abdomen.  You are unable to sleep because it is hard to breathe.  You feel like your heart is beating fast (palpitations).  You become dizzy or light-headed upon standing up. SEEK IMMEDIATE MEDICAL CARE IF:   You have difficulty breathing.  There is a change in mental status such as decreased alertness or difficulty with concentration.  You have a pain or discomfort in your chest.  You have an episode of fainting (syncope). MAKE SURE YOU:   Understand these instructions.  Will watch your condition.  Will get help right away if you are not doing well or get worse. Document Released: 05/31/2005 Document Revised: 10/15/2013 Document Reviewed: 06/30/2012 [facility] Patient Information 2015 [place], [place]. This information is not intended to replace advice given to you by your health care provider. Make sure you discuss any questions you have with your health care provider.  Olanzapine tablets What is this medicine? OLANZAPINE (oh LAN za peen) is used to treat schizophrenia, psychotic disorders, and bipolar disorder. Bipolar disorder is also known as manic-depression. This medicine may be used for other purposes;  ask your health care provider or pharmacist if you have questions. COMMON BRAND NAME(S): Zyprexa What should I tell my health care provider before I take this medicine? They need to know if you have any of these conditions: -breast cancer or history of breast cancer -cigarette smoker -dementia -diabetes mellitus, high blood sugar or a family history of diabetes -difficulty swallowing -glaucoma -heart disease, irregular heartbeat, or previous heart attack -history of brain tumor or head injury -kidney or liver disease -low blood pressure or dizziness when standing  up -Parkinson's disease -prostate trouble -seizures (convulsions) -suicidal thoughts, plans, or attempt by you or a family member -an unusual or allergic reaction to olanzapine, other medicines, foods, dyes, or preservatives -pregnant or trying to get pregnant -breast-feeding How should I use this medicine? Take this medicine by mouth. Swallow it with a drink of water. Follow the directions on the prescription label. Take your medicine at regular intervals. Do not take it more often than directed. Do not stop taking except on the advice of your doctor or health care professional. A special MedGuide will be given to you by the pharmacist with each new prescription and refill. Be sure to read this information carefully each time. Talk to your pediatrician regarding the use of this medicine in children. While this drug may be prescribed for children as young as 13 years for selected conditions, precautions do apply. Overdosage: If you think you have taken too much of this medicine contact a poison control center or emergency room at once. NOTE: This medicine is only for you. Do not share this medicine with others. What if I miss a dose? If you miss a dose, take it as soon as you can. If it is almost time for your next dose, take only that dose. Do not take double or extra doses. What may interact with this medicine? Do not take this medicine with any of the following medications: -certain antibiotics like grepafloxacin and sparfloxacin -certain phenothiazines like chlorpromazine, mesoridazine, and thioridazine -cisapride -clozapine -droperidol -halofantrine -levomethadyl -pimozide This medicine may also interact with the following medications: -carbamazepine -charcoal -fluvoxamine -levodopa and other medicines for Parkinson's disease -medicines for diabetes -medicines for high blood pressure -medicines for mental depression, anxiety, other mood disorders, or sleeping  problems -omeprazole -rifampin -ritonavir -tobacco from cigarettes This list may not describe all possible interactions. Give your health care provider a list of all the medicines, herbs, non-prescription drugs, or dietary supplements you use. Also tell them if you smoke, drink alcohol, or use illegal drugs. Some items may interact with your medicine. What should I watch for while using this medicine? Visit your doctor or health care professional for regular checks on your progress. It may be several weeks before you see the full effects of this medicine. Notify your doctor or health care professional if your symptoms get worse, if you have new symptoms, if you are having an unusual effect from this medicine, or if you feel out of control, very discouraged or think you might harm yourself or others. Do not suddenly stop taking this medicine. You may need to gradually reduce the dose. Ask your doctor or health care professional for advice. You may get dizzy or drowsy. Do not drive, use machinery, or do anything that needs mental alertness until you know how this medicine affects you. Do not stand or sit up quickly, especially if you are an older patient. This reduces the risk of dizzy or fainting spells. Avoid alcoholic drinks. Alcohol can increase dizziness  and drowsiness with olanzapine. Do not treat yourself for colds, diarrhea or allergies without asking your doctor or health care professional for advice. Some ingredients can increase possible side effects. Your mouth may get dry. Chewing sugarless gum or sucking hard candy, and drinking plenty of water will help. This medicine can reduce the response of your body to heat or cold. Dress warm in cold weather and stay hydrated in hot weather. If possible, avoid extreme temperatures like saunas, hot tubs, very hot or cold showers, or activities that can cause dehydration such as vigorous exercise. If you notice an increased hunger or thirst, different  from your normal hunger or thirst, or if you find that you have to urinate more frequently, you should contact your health care provider as soon as possible. You may need to have your blood sugar monitored. This medicine may cause changes in your blood sugar levels. You should monitor you blood sugar frequently if you have diabetes. If you smoke, tell your doctor if you notice this medicine is not working well for you. Talk to your doctor if you are a smoker or if you decide to stop smoking. What side effects may I notice from receiving this medicine? Side effects that you should report to your doctor or health care professional as soon as possible: -difficulty breathing -difficulty in speaking or swallowing -excessive thirst and/or hunger -fast heartbeat (palpitations) -fever or chills, sore throat -frequently needing to urinate -inability to control muscle movements in the face, hands, arms, or legs -painful or prolonged erections -restlessness or need to keep moving -seizures (convulsions) -skin rash -stiffness, spasms -swelling of face or legs -tremors or trembling -weight gain Side effects that usually do not require medical attention (report to your doctor or health care professional if they continue or are bothersome): -changes in sexual desire -constipation -drowsiness -lowered blood pressure This list may not describe all possible side effects. Call your doctor for medical advice about side effects. You may report side effects to FDA at 1-800-FDA-1088. Where should I keep my medicine? Keep out of the reach of children. Store at controlled room temperature between 15 and 30 degrees C (59 and 86 degrees F). Protect from light and moisture. Throw away any unused medicine after the expiration date. NOTE: This sheet is a summary. It may not cover all possible information. If you have questions about this medicine, talk to your doctor, pharmacist, or health care provider.  2015,  [employer]. (2012-08-08 17:48:04)

## 2015-01-03 ENCOUNTER — Non-Acute Institutional Stay (SKILLED_NURSING_FACILITY): Payer: Medicare Other | Admitting: Internal Medicine

## 2015-01-03 ENCOUNTER — Encounter: Payer: Self-pay | Admitting: Internal Medicine

## 2015-01-03 DIAGNOSIS — I482 Chronic atrial fibrillation, unspecified: Secondary | ICD-10-CM

## 2015-01-03 DIAGNOSIS — D638 Anemia in other chronic diseases classified elsewhere: Secondary | ICD-10-CM

## 2015-01-03 DIAGNOSIS — J189 Pneumonia, unspecified organism: Secondary | ICD-10-CM | POA: Diagnosis not present

## 2015-01-03 DIAGNOSIS — F39 Unspecified mood [affective] disorder: Secondary | ICD-10-CM | POA: Diagnosis not present

## 2015-01-03 DIAGNOSIS — J441 Chronic obstructive pulmonary disease with (acute) exacerbation: Secondary | ICD-10-CM

## 2015-01-03 DIAGNOSIS — E876 Hypokalemia: Secondary | ICD-10-CM

## 2015-01-03 DIAGNOSIS — I5033 Acute on chronic diastolic (congestive) heart failure: Secondary | ICD-10-CM | POA: Diagnosis not present

## 2015-01-03 DIAGNOSIS — J9622 Acute and chronic respiratory failure with hypercapnia: Secondary | ICD-10-CM

## 2015-01-03 DIAGNOSIS — E46 Unspecified protein-calorie malnutrition: Secondary | ICD-10-CM

## 2015-01-03 LAB — CULTURE, BLOOD (ROUTINE X 2)
CULTURE: NO GROWTH
Culture: NO GROWTH

## 2015-01-03 NOTE — Progress Notes (Signed)
Patient ID: Jennifer Nielsen, female   DOB: 23-May-1925, 81 y.o.   MRN: [id number]    [place]  PCP: [name], MD  Code Status: DNR  Allergies  Allergen Reactions  . Chocolate Itching  . Ciprofloxacin Itching  . Coffee Bean Extract [Coffea Arabica] Itching  . Coffee Flavor Itching  . Eggs Or Egg-Derived Products   . Gentamycin [Gentamicin] Itching  . Imodium [Loperamide] Other (See Comments)    unknown  . Penicillins Itching  . Sulfa Antibiotics Itching  . Tea Itching    Chief Complaint  Patient presents with  . Readmit To SNF     HPI:  81 y.o. patient is here for long term care post hospital admission from 12/28/14-01/02/15 with acute on chronic respiratory failure from COPD exacerbation, CHF exacerbation and HCAP. She has been having recurrent hospital admission with these issues. She is now post antibiotics, diuresis and steroid treatment.  She has past medical history of chronic respiratory failure on 2 L of home oxygen, chronic diastolic CHF, COPD. She is seen in her room today. She is alert and oriented to person, place and time and mentions that she would not want to go to hospital again if she has problem breathing. She is ok with breathing treatment and antibiotics in the facility.    Review of Systems:  Constitutional: Negative for fever, chills, diaphoresis. positive for fatigue HENT: Negative for headache, congestion Eyes: Negative for eye pain, blurred vision, double vision and discharge.  Respiratory: Negative for cough, shortness of breath and wheezing. Just completed her nebulizer treatment  Cardiovascular: Negative for chest pain, palpitations, leg swelling.  Gastrointestinal: Negative for heartburn, nausea, vomiting, abdominal pain Genitourinary: Negative for dysuria Musculoskeletal: Negative for back pain, falls in facility. Positive for weakness Skin: Negative for itching, rash.  Neurological: Negative for dizziness,  tingling Psychiatric/Behavioral: Negative for depression    Past Medical History  Diagnosis Date  . Arrhythmia   . Arthritis   . Atrial fibrillation     Remains stable (As of 05/09/13)  . H/O blood clots   . Hyperlipemia   . GERD (gastroesophageal reflux disease)     Stable (As of 05/09/13)  . Osteoarthritis     Denies pain (As of 05/09/13)  . CHF (congestive heart failure)     chronic diastolic  . COPD (chronic obstructive pulmonary disease)     w/exacerbation  . Dementia     Remains stable & continues to function adequately in the current living environment with supervision. Has had little changes in behavior (AS OF 05/09/13)  . Pleural effusion 11/12/2014  . S/P thoracentesis    Past Surgical History  Procedure Laterality Date  . Appendectomy    . Gallbladder surgery    . Hip surgery      Right-Metal plate   . Leg surgery      Left leg   Social History:   reports that she has quit smoking. She has never used smokeless tobacco. She reports that she does not drink alcohol or use illicit drugs.  Family History  Problem Relation Age of Onset  . Colon cancer Neg Hx   . Heart disease Father   . Breast cancer Daughter     Medications:   Medication List       This list is accurate as of: 01/03/15 11:59 PM.  Always use your most recent med list.               AMBULATORY NON FORMULARY MEDICATION  Medication Name: Med Pass, 120 mLs by mouth twice daily     antiseptic oral rinse 0.05 % Liqd solution  Commonly known as:  CPC / CETYLPYRIDINIUM CHLORIDE 0.05%  7 mLs by Mouth Rinse route 2 times daily at 12 noon and 4 pm.     budesonide-formoterol 160-4.5 MCG/ACT inhaler  Commonly known as:  SYMBICORT  Inhale 2 puffs into the lungs 2 (two) times daily.     CALTRATE 600+D PO  Take 1 tablet by mouth every morning. For calcium supplement     DECUBI-VITE Caps  Take 1 capsule by mouth daily.     diltiazem 180 MG 24 hr capsule  Commonly known as:  CARDIZEM CD   Take 1 capsule (180 mg total) by mouth daily. For A-Fib     DSS 100 MG Caps  Take 100 mg by mouth daily. For constipation     feeding supplement (GLUCERNA SHAKE) Liqd  Take 237 mLs by mouth 3 (three) times daily between meals.     furosemide 40 MG tablet  Commonly known as:  LASIX  Take 40 mg by mouth 2 (two) times daily.     ipratropium-albuterol 0.5-2.5 (3) MG/3ML Soln  Commonly known as:  DUONEB  12ml nebulized 4 times daily scheduled and every 4hrs as needed for wheezing for 3 days. And then every 4hours as needed for wheezing.     OLANZapine 5 MG tablet  Commonly known as:  ZYPREXA  Take 1 tablet (5 mg total) by mouth at bedtime.     OXYGEN  Inhale 2 L into the lungs continuous.     potassium chloride SA 20 MEQ tablet  Commonly known as:  K-DUR,KLOR-CON  Take 1 tablet (20 mEq total) by mouth daily.     SYSTANE OP  Place 1 drop into both eyes 2 (two) times daily. For dry eyes     TYLENOL 500 MG tablet  Generic drug:  acetaminophen  Take 500 mg by mouth 3 (three) times daily.     XARELTO 20 MG Tabs tablet  Generic drug:  rivaroxaban  Take 20 mg by mouth daily. For DVT         Physical Exam: Filed Vitals:   01/03/15 0920  BP: 139/73  Pulse: 91  Temp: 96.9 F (36.1 C)  TempSrc: Oral  Resp: 18  SpO2: 97%   General- elderly female, obese, chronically ill appearing, in no acute distress Head- normocephalic, atraumatic Throat- moist mucus membrane Eyes- no pallor, no icterus, no discharge, normal conjunctiva, normal sclera Neck- no cervical lymphadenopathy Cardiovascular- normal s1,s2, no murmurs, no leg edema Respiratory- bilateral poor inspiratory effort with decreased air entry, on o2, no wheeze, no rhonchi, no crackles, no use of accessory muscles Abdomen- bowel sounds present, soft, non tender Musculoskeletal- generalized weakness more in lower extremities Neurological- alert and oriented this visit Skin- warm and dry Psychiatry- calm with normal  affect   Labs reviewed: Basic Metabolic Panel:  Recent Labs  11/13/14 0345  11/16/14 0455  11/18/14 0430  12/30/14 0413 12/31/14 0345 01/01/15 0753  NA 150*  < > 137  < > 139  < > 141 140 143  K 3.3*  < > 3.2*  < > 2.4*  < > 3.0* 4.1 3.8  CL 95*  < > 70*  < > 67*  < > 92* 93* 93*  CO2 44*  < > >50*  < > >50*  < > 39* 36* 44*  GLUCOSE 165*  < > 100*  < >  109*  < > 151* 154* 129*  BUN 33*  < > 30*  < > 32*  < > 37* 46* 49*  CREATININE 0.58  < > 0.48  < > 0.58  < > 0.74 0.78 0.72  CALCIUM 8.6*  < > 8.5*  < > 9.0  < > 8.3* 8.3* 8.4*  MG 2.1  --  2.0  --  2.1  --   --   --   --   < > = values in this interval not displayed. Liver Function Tests:  Recent Labs  11/08/14 0349 11/12/14 0345 12/28/14 1742  AST 29 27 32  ALT 22 25 34  ALKPHOS 65 59 92  BILITOT 0.8 0.4 0.9  PROT 6.2* 6.1* 7.7  ALBUMIN 2.6* 2.4* 3.5   No results for input(s): LIPASE, AMYLASE in the last 8760 hours.  Recent Labs  08/11/14 2251 08/19/14 0510  AMMONIA 22 27   CBC:  Recent Labs  10/09/14 1753  11/07/14 0845  12/28/14 1742 12/28/14 1852 12/29/14 0358 01/01/15 0753  WBC 11.4*  < > 20.0*  < > 16.2*  --  11.8* 11.2*  NEUTROABS 10.7*  --  17.0*  --  14.0*  --   --   --   HGB 11.1*  < > 12.1  < > 10.9* 12.6 9.8* 10.3*  HCT 36.6  < > 39.2  < > 37.4 37.0 33.1* 35.3*  MCV 99.7  < > 102.1*  < > 102.7*  --  102.5* 102.0*  PLT 266  < > 296  < > 441*  --  372 379  < > = values in this interval not displayed. Cardiac Enzymes:  Recent Labs  11/07/14 0845 11/07/14 1713 11/07/14 2302  TROPONINI 0.11* 0.08* 0.06*   BNP: Invalid input(s): POCBNP CBG:  Recent Labs  01/02/15 0426 01/02/15 0738 01/02/15 1246  GLUCAP 115* 109* 225*    Radiological Exams: Dg Chest Port 1 View  12/29/2014   CLINICAL DATA:  81 year old female with pneumonia and history of atrial fibrillation  EXAM: PORTABLE CHEST - 1 VIEW  COMPARISON:  Prior chest x-ray 12/28/2014  FINDINGS: Stable cardiomegaly.  Atherosclerotic calcifications again noted in the transverse aorta. Pulmonary vascular congestion with perhaps mild interstitial edema. Findings are slightly progressed compared to prior. Bilateral left larger than right layering pleural effusions and associated bibasilar atelectasis. Infiltrate difficult to exclude in the left base. No acute osseous abnormality.  IMPRESSION: 1. Similar to slightly increased interstitial pulmonary edema. 2. Otherwise unchanged cardiomegaly and left larger than right layering effusions with associated bibasilar atelectasis.   Electronically Signed   By: Jacqulynn Cadet M.D.   On: 12/29/2014 08:20   Dg Chest Port 1 View  12/28/2014   CLINICAL DATA:  Respiratory distress.  EXAM: PORTABLE CHEST - 1 VIEW  COMPARISON:  11/14/2014  FINDINGS: There is cardiac enlargement. Bilateral pleural effusions are identified left greater than right. There is mild diffuse interstitial edema. Atelectasis is identified overlying the effusions.  IMPRESSION: 1. Congestive heart failure.   Electronically Signed   By: Kerby Moors M.D.   On: 12/28/2014 18:26    Assessment/Plan  Acute on chronic respiratory failure   This admission treated for pneumonia, copd and chf. Continue o2 and bronchodilators. Will get palliative care consult given her recurrent hospital admission with respiratory failure and poor overall prognosis. Patient clearly expresses not wanting to be sent to hospital any further.  HCAP Complete course of antibiotics, continue breathing exercises and pulmonary toileting as  tolerated  COPD exacerbation Continue o2, completed antibiotic and prednisone. Continue duoneb and symbicort  Chronic diastolic CHF Monitor breathing and weight. continue Lasix 40 mg bid, o2. monitor bmp  Protein calorie malnutrition Continue protein supplement, assistance with ADLs as needed, continue MVI, oral hygiene, ca-vit d  Chronic atrial fibrillation Rate controlled. continue Cardizem 120  mg daily and Xarelto 20 mg daily   Anemia of chronic disease Monitor cbc  Hypokalemia Start kdur 20 meq daily and monitor bmp  Mood disorder Calm and pleasant this visit, continue zyprexa 5 mg daily   Goals of care: long term care   Labs/tests ordered: cbc,cmp  Family/ staff Communication: reviewed care plan with patient and nursing supervisor    Blanchie Serve, MD  [facility] [phone] (Monday-Friday 8 am - 5 pm) [phone] (afterhours)

## 2015-01-16 DIAGNOSIS — R0602 Shortness of breath: Secondary | ICD-10-CM | POA: Diagnosis not present

## 2015-01-20 ENCOUNTER — Emergency Department (HOSPITAL_COMMUNITY): Payer: Medicare Other

## 2015-01-20 ENCOUNTER — Observation Stay (HOSPITAL_COMMUNITY): Payer: Medicare Other

## 2015-01-20 ENCOUNTER — Inpatient Hospital Stay (HOSPITAL_COMMUNITY)
Admission: EM | Admit: 2015-01-20 | Discharge: 2015-01-28 | DRG: 291 | Disposition: A | Discharge status: Skilled Nursing Facility | Payer: Medicare Other | Attending: Internal Medicine | Admitting: Internal Medicine

## 2015-01-20 ENCOUNTER — Encounter (HOSPITAL_COMMUNITY): Payer: Self-pay | Admitting: Emergency Medicine

## 2015-01-20 DIAGNOSIS — I509 Heart failure, unspecified: Secondary | ICD-10-CM | POA: Diagnosis not present

## 2015-01-20 DIAGNOSIS — Z9981 Dependence on supplemental oxygen: Secondary | ICD-10-CM | POA: Diagnosis not present

## 2015-01-20 DIAGNOSIS — J9620 Acute and chronic respiratory failure, unspecified whether with hypoxia or hypercapnia: Secondary | ICD-10-CM | POA: Diagnosis present

## 2015-01-20 DIAGNOSIS — Z6833 Body mass index (BMI) 33.0-33.9, adult: Secondary | ICD-10-CM | POA: Diagnosis not present

## 2015-01-20 DIAGNOSIS — J9622 Acute and chronic respiratory failure with hypercapnia: Secondary | ICD-10-CM | POA: Diagnosis present

## 2015-01-20 DIAGNOSIS — F0390 Unspecified dementia without behavioral disturbance: Secondary | ICD-10-CM | POA: Diagnosis present

## 2015-01-20 DIAGNOSIS — I5033 Acute on chronic diastolic (congestive) heart failure: Secondary | ICD-10-CM | POA: Diagnosis not present

## 2015-01-20 DIAGNOSIS — J441 Chronic obstructive pulmonary disease with (acute) exacerbation: Secondary | ICD-10-CM | POA: Diagnosis not present

## 2015-01-20 DIAGNOSIS — Z8249 Family history of ischemic heart disease and other diseases of the circulatory system: Secondary | ICD-10-CM

## 2015-01-20 DIAGNOSIS — D638 Anemia in other chronic diseases classified elsewhere: Secondary | ICD-10-CM | POA: Diagnosis not present

## 2015-01-20 DIAGNOSIS — G9340 Encephalopathy, unspecified: Secondary | ICD-10-CM | POA: Diagnosis present

## 2015-01-20 DIAGNOSIS — E872 Acidosis: Secondary | ICD-10-CM | POA: Diagnosis present

## 2015-01-20 DIAGNOSIS — Z79899 Other long term (current) drug therapy: Secondary | ICD-10-CM

## 2015-01-20 DIAGNOSIS — Z66 Do not resuscitate: Secondary | ICD-10-CM | POA: Diagnosis present

## 2015-01-20 DIAGNOSIS — J449 Chronic obstructive pulmonary disease, unspecified: Secondary | ICD-10-CM | POA: Diagnosis not present

## 2015-01-20 DIAGNOSIS — N390 Urinary tract infection, site not specified: Secondary | ICD-10-CM | POA: Diagnosis present

## 2015-01-20 DIAGNOSIS — Z7901 Long term (current) use of anticoagulants: Secondary | ICD-10-CM | POA: Diagnosis not present

## 2015-01-20 DIAGNOSIS — E875 Hyperkalemia: Secondary | ICD-10-CM | POA: Diagnosis not present

## 2015-01-20 DIAGNOSIS — Z881 Allergy status to other antibiotic agents status: Secondary | ICD-10-CM | POA: Diagnosis not present

## 2015-01-20 DIAGNOSIS — R0602 Shortness of breath: Secondary | ICD-10-CM | POA: Diagnosis not present

## 2015-01-20 DIAGNOSIS — I4891 Unspecified atrial fibrillation: Secondary | ICD-10-CM | POA: Diagnosis not present

## 2015-01-20 DIAGNOSIS — E876 Hypokalemia: Secondary | ICD-10-CM | POA: Diagnosis present

## 2015-01-20 DIAGNOSIS — B965 Pseudomonas (aeruginosa) (mallei) (pseudomallei) as the cause of diseases classified elsewhere: Secondary | ICD-10-CM | POA: Diagnosis present

## 2015-01-20 DIAGNOSIS — E440 Moderate protein-calorie malnutrition: Secondary | ICD-10-CM | POA: Diagnosis not present

## 2015-01-20 DIAGNOSIS — Z87891 Personal history of nicotine dependence: Secondary | ICD-10-CM

## 2015-01-20 DIAGNOSIS — J9621 Acute and chronic respiratory failure with hypoxia: Secondary | ICD-10-CM | POA: Diagnosis present

## 2015-01-20 DIAGNOSIS — R4020 Unspecified coma: Secondary | ICD-10-CM

## 2015-01-20 DIAGNOSIS — R531 Weakness: Secondary | ICD-10-CM | POA: Diagnosis not present

## 2015-01-20 DIAGNOSIS — R069 Unspecified abnormalities of breathing: Secondary | ICD-10-CM | POA: Diagnosis not present

## 2015-01-20 DIAGNOSIS — E46 Unspecified protein-calorie malnutrition: Secondary | ICD-10-CM | POA: Diagnosis present

## 2015-01-20 DIAGNOSIS — R1030 Lower abdominal pain, unspecified: Secondary | ICD-10-CM | POA: Diagnosis not present

## 2015-01-20 DIAGNOSIS — J069 Acute upper respiratory infection, unspecified: Secondary | ICD-10-CM | POA: Diagnosis not present

## 2015-01-20 DIAGNOSIS — E785 Hyperlipidemia, unspecified: Secondary | ICD-10-CM | POA: Diagnosis present

## 2015-01-20 LAB — BLOOD GAS, ARTERIAL
ACID-BASE EXCESS: 10.2 mmol/L — AB (ref 0.0–2.0)
Acid-Base Excess: 16.2 mmol/L — ABNORMAL HIGH (ref 0.0–2.0)
Bicarbonate: 42.7 mEq/L — ABNORMAL HIGH (ref 20.0–24.0)
Bicarbonate: 43.7 mEq/L — ABNORMAL HIGH (ref 20.0–24.0)
DRAWN BY: 295031
Delivery systems: POSITIVE
Drawn by: 295031
EXPIRATORY PAP: 6
FIO2: 1
FIO2: 0.4
INSPIRATORY PAP: 12
O2 Saturation: 96.9 %
O2 Saturation: 93 %
PATIENT TEMPERATURE: 98.6
PCO2 ART: 94.3 mmHg — AB (ref 35.0–45.0)
PH ART: 7.385 (ref 7.350–7.450)
Patient temperature: 98.6
RATE: 16 resp/min
TCO2: 37 mmol/L (ref 0–100)
TCO2: 40.9 mmol/L (ref 0–100)
pCO2 arterial: 74.7 mmHg (ref 35.0–45.0)
pH, Arterial: 7.278 — ABNORMAL LOW (ref 7.350–7.450)
pO2, Arterial: 112 mmHg — ABNORMAL HIGH (ref 80.0–100.0)
pO2, Arterial: 67.8 mmHg — ABNORMAL LOW (ref 80.0–100.0)

## 2015-01-20 LAB — BASIC METABOLIC PANEL
Anion gap: 8 (ref 5–15)
BUN: 31 mg/dL — AB (ref 6–20)
CO2: 40 mmol/L — ABNORMAL HIGH (ref 22–32)
CREATININE: 0.49 mg/dL (ref 0.44–1.00)
Calcium: 9.1 mg/dL (ref 8.9–10.3)
Chloride: 92 mmol/L — ABNORMAL LOW (ref 101–111)
GFR calc non Af Amer: >60 mL/min (ref >60)
Glucose, Bld: 126 mg/dL — ABNORMAL HIGH (ref 65–99)
Potassium: 5.3 mmol/L — ABNORMAL HIGH (ref 3.5–5.1)
Sodium: 140 mmol/L (ref 135–145)

## 2015-01-20 LAB — HEPATIC FUNCTION PANEL
ALBUMIN: 3.1 g/dL — AB (ref 3.5–5.0)
ALT: 45 U/L (ref 14–54)
AST: 53 U/L — ABNORMAL HIGH (ref 15–41)
Alkaline Phosphatase: 99 U/L (ref 38–126)
Bilirubin, Direct: 0.2 mg/dL (ref 0.1–0.5)
Indirect Bilirubin: 0.7 mg/dL (ref 0.3–0.9)
Total Bilirubin: 0.9 mg/dL (ref 0.3–1.2)
Total Protein: 6.9 g/dL (ref 6.5–8.1)

## 2015-01-20 LAB — URINALYSIS, ROUTINE W REFLEX MICROSCOPIC
Bilirubin Urine: NEGATIVE
Glucose, UA: NEGATIVE mg/dL
Ketones, ur: NEGATIVE mg/dL
Nitrite: POSITIVE — AB
PROTEIN: 30 mg/dL — AB
Specific Gravity, Urine: 1.024 (ref 1.005–1.030)
Urobilinogen, UA: 1 mg/dL (ref 0.0–1.0)
pH: 5.5 (ref 5.0–8.0)

## 2015-01-20 LAB — CBC WITH DIFFERENTIAL/PLATELET
BASOS ABS: 0 10*3/uL (ref 0.0–0.1)
BASOS PCT: 0 % (ref 0–1)
EOS ABS: 0 10*3/uL (ref 0.0–0.7)
Eosinophils Relative: 0 % (ref 0–5)
HCT: 34.8 % — ABNORMAL LOW (ref 36.0–46.0)
Hemoglobin: 10.2 g/dL — ABNORMAL LOW (ref 12.0–15.0)
LYMPHS ABS: 0.4 10*3/uL — AB (ref 0.7–4.0)
LYMPHS PCT: 4 % — AB (ref 12–46)
MCH: 29.3 pg (ref 26.0–34.0)
MCHC: 29.3 g/dL — ABNORMAL LOW (ref 30.0–36.0)
MCV: 100 fL (ref 78.0–100.0)
MONO ABS: 0.1 10*3/uL (ref 0.1–1.0)
Monocytes Relative: 2 % — ABNORMAL LOW (ref 3–12)
NEUTROS ABS: 7.7 10*3/uL (ref 1.7–7.7)
Neutrophils Relative %: 94 % — ABNORMAL HIGH (ref 43–77)
PLATELETS: 275 10*3/uL (ref 150–400)
RBC: 3.48 MIL/uL — ABNORMAL LOW (ref 3.87–5.11)
RDW: 17.8 % — AB (ref 11.5–15.5)
WBC: 8.2 10*3/uL (ref 4.0–10.5)

## 2015-01-20 LAB — URINE MICROSCOPIC-ADD ON

## 2015-01-20 LAB — BRAIN NATRIURETIC PEPTIDE: B Natriuretic Peptide: 359.7 pg/mL — ABNORMAL HIGH (ref 0.0–100.0)

## 2015-01-20 LAB — LIPASE, BLOOD: Lipase: 15 U/L — ABNORMAL LOW (ref 22–51)

## 2015-01-20 LAB — I-STAT TROPONIN, ED: Troponin i, poc: 0.03 ng/mL (ref 0.00–0.08)

## 2015-01-20 MED ORDER — NITROGLYCERIN 2 % TD OINT
0.5000 [in_us] | TOPICAL_OINTMENT | Freq: Once | TRANSDERMAL | Status: AC
  Administered 2015-01-20: 0.5 [in_us] via TOPICAL
  Filled 2015-01-20: qty 30

## 2015-01-20 MED ORDER — BISACODYL 10 MG RE SUPP: 10.0000 mg | Freq: Every day | RECTAL | Status: DC | PRN

## 2015-01-20 MED ORDER — NITROGLYCERIN 2 % TD OINT
1.0000 [in_us] | TOPICAL_OINTMENT | Freq: Once | TRANSDERMAL | Status: AC
  Administered 2015-01-20: 1 [in_us] via TOPICAL

## 2015-01-20 MED ORDER — IPRATROPIUM-ALBUTEROL 0.5-2.5 (3) MG/3ML IN SOLN
3.0000 mL | RESPIRATORY_TRACT | Status: DC
  Administered 2015-01-20 – 2015-01-21 (×3): 3 mL via RESPIRATORY_TRACT
  Filled 2015-01-20 (×3): qty 3

## 2015-01-20 MED ORDER — ACETAMINOPHEN 325 MG PO TABS
650.0000 mg | ORAL_TABLET | Freq: Four times a day (QID) | ORAL | Status: DC | PRN
  Administered 2015-01-22: 650 mg via ORAL
  Filled 2015-01-20: qty 2

## 2015-01-20 MED ORDER — CETYLPYRIDINIUM CHLORIDE 0.05 % MT LIQD
7.0000 mL | Freq: Two times a day (BID) | OROMUCOSAL | Status: DC
  Administered 2015-01-20 – 2015-01-28 (×15): 7 mL via OROMUCOSAL

## 2015-01-20 MED ORDER — METHYLPREDNISOLONE SODIUM SUCC 125 MG IJ SOLR
80.0000 mg | Freq: Three times a day (TID) | INTRAMUSCULAR | Status: DC
  Administered 2015-01-20 – 2015-01-21 (×2): 80 mg via INTRAVENOUS
  Filled 2015-01-20 (×2): qty 2

## 2015-01-20 MED ORDER — ONDANSETRON HCL 4 MG/2ML IJ SOLN: 4.0000 mg | Freq: Four times a day (QID) | INTRAMUSCULAR | Status: DC | PRN

## 2015-01-20 MED ORDER — FUROSEMIDE 10 MG/ML IJ SOLN
80.0000 mg | INTRAMUSCULAR | Status: AC
  Administered 2015-01-20: 80 mg via INTRAVENOUS
  Filled 2015-01-20: qty 8

## 2015-01-20 MED ORDER — METHYLPREDNISOLONE SODIUM SUCC 125 MG IJ SOLR
125.0000 mg | Freq: Once | INTRAMUSCULAR | Status: AC
  Administered 2015-01-20: 125 mg via INTRAVENOUS
  Filled 2015-01-20: qty 2

## 2015-01-20 MED ORDER — ACETAMINOPHEN 650 MG RE SUPP: 650.0000 mg | Freq: Four times a day (QID) | RECTAL | Status: DC | PRN

## 2015-01-20 MED ORDER — ALBUTEROL SULFATE (2.5 MG/3ML) 0.083% IN NEBU: 2.5000 mg | INHALATION_SOLUTION | RESPIRATORY_TRACT | Status: DC | PRN

## 2015-01-20 MED ORDER — FUROSEMIDE 10 MG/ML IJ SOLN
80.0000 mg | Freq: Two times a day (BID) | INTRAMUSCULAR | Status: DC
  Administered 2015-01-20 – 2015-01-26 (×12): 80 mg via INTRAVENOUS
  Filled 2015-01-20 (×13): qty 8

## 2015-01-20 MED ORDER — IPRATROPIUM-ALBUTEROL 0.5-2.5 (3) MG/3ML IN SOLN
3.0000 mL | Freq: Once | RESPIRATORY_TRACT | Status: AC
  Administered 2015-01-20: 3 mL via RESPIRATORY_TRACT
  Filled 2015-01-20: qty 3

## 2015-01-20 MED ORDER — ONDANSETRON HCL 4 MG PO TABS: 4.0000 mg | ORAL_TABLET | Freq: Four times a day (QID) | ORAL | Status: DC | PRN

## 2015-01-20 MED ORDER — POLYVINYL ALCOHOL 1.4 % OP SOLN
1.0000 [drp] | OPHTHALMIC | Status: DC | PRN
  Administered 2015-01-20: 1 [drp] via OPHTHALMIC
  Filled 2015-01-20: qty 15

## 2015-01-20 NOTE — Consult Note (Signed)
Name: Jennifer Nielsen MRN: [id number] DOB: November 17, 1924    ADMISSION DATE:  01/20/2015 CONSULTATION DATE:  8/8   REFERRING MD :  Coralyn Pear   CHIEF COMPLAINT:  Acute on chronic respiratory failure   HISTORY OF PRESENT ILLNESS:   This is a 81 year old female who resides at an SNF and is W/C bound at baseline. She carries a h/o chronic respiratory failure on the basis of COPD (on 2 liters at baseline w/ calculated baseline CO2 in 60s), also has h/o chronic diastolic HF, atrial fib, and dementia (which she @ baseline is stable and alert). Presents to the ER w/ report of 1 day h/o progressive increased work of breathing and abd discomfort. On time of arrival was essentially unresponsive. Her sats on O2 were in 44s. Her PCO2 was > 90 on initial blood gas w/ PCXR showing diffuse pulmonary edema and bilateral pleural effusions. She was placed on NIPPV, given lasix and started empirically on abx given concern for possible HCAP. PCCM was asked to consult.   PAST MEDICAL HISTORY :   has a past medical history of Arrhythmia; Arthritis; Atrial fibrillation; H/O blood clots; Hyperlipemia; GERD (gastroesophageal reflux disease); Osteoarthritis; CHF (congestive heart failure); COPD (chronic obstructive pulmonary disease); Dementia; Pleural effusion (11/12/2014); and S/P thoracentesis.  has past surgical history that includes Appendectomy; Gallbladder surgery; Hip surgery; and Leg Surgery. Prior to Admission medications   Medication Sig Start Date End Date Taking? Authorizing Provider  acetaminophen (TYLENOL) 500 MG tablet Take 500 mg by mouth 3 (three) times daily.    Yes Historical Provider, MD  AMBULATORY NON FORMULARY MEDICATION Medication Name: Med Pass, 120 mLs by mouth twice daily   Yes Historical Provider, MD  antiseptic oral rinse (CPC / CETYLPYRIDINIUM CHLORIDE 0.05%) 0.05 % LIQD solution 7 mLs by Mouth Rinse route 2 times daily at 12 noon and 4 pm. 01/02/15  Yes Robbie Lis, MD  budesonide-formoterol  [facility]) 160-4.5 MCG/ACT inhaler Inhale 2 puffs into the lungs 2 (two) times daily.   Yes Historical Provider, MD  Calcium Carbonate-Vitamin D (CALTRATE 600+D PO) Take 1 tablet by mouth every morning. For calcium supplement   Yes Historical Provider, MD  diltiazem (CARDIZEM CD) 180 MG 24 hr capsule Take 1 capsule (180 mg total) by mouth daily. For A-Fib 11/20/14  Yes Velvet Bathe, MD  Docusate Sodium (DSS) 100 MG CAPS Take 100 mg by mouth daily. For constipation 07/02/13  Yes Bobby Rumpf [place], PA-C  furosemide (LASIX) 40 MG tablet Take 40 mg by mouth 2 (two) times daily.   Yes Historical Provider, MD  ipratropium-albuterol (DUONEB) 0.5-2.5 (3) MG/3ML SOLN 33ml nebulized 4 times daily scheduled and every 4hrs as needed for wheezing for 3 days. And then every 4hours as needed for wheezing. Patient taking differently: Take 3 mLs by mouth every 4 (four) hours as needed (for wheezing).  09/18/14  Yes Bonnielee Haff, MD  Multiple Vitamins-Minerals (DECUBI-VITE) CAPS Take 1 capsule by mouth daily.   Yes Historical Provider, MD  OLANZapine (ZYPREXA) 5 MG tablet Take 1 tablet (5 mg total) by mouth at bedtime. 01/02/15  Yes Robbie Lis, MD  OXYGEN Inhale 2 L into the lungs continuous.    Yes Historical Provider, MD  Polyethyl Glycol-Propyl Glycol (SYSTANE OP) Place 1 drop into both eyes 2 (two) times daily. For dry eyes   Yes Historical Provider, MD  potassium chloride SA (K-DUR,KLOR-CON) 20 MEQ tablet Take 1 tablet (20 mEq total) by mouth daily. 10/12/14  Yes Theodis Blaze,  MD  Rivaroxaban (XARELTO) 20 MG TABS tablet Take 20 mg by mouth daily. For DVT   Yes Historical Provider, MD  feeding supplement, GLUCERNA SHAKE, (GLUCERNA SHAKE) LIQD Take 237 mLs by mouth 3 (three) times daily between meals. 01/02/15   Robbie Lis, MD   Allergies  Allergen Reactions  . Chocolate Itching  . Ciprofloxacin Itching  . Coffee Bean Extract [Coffea Arabica] Itching  . Coffee Flavor Itching  . Eggs Or Egg-Derived Products   .  Gentamycin [Gentamicin] Itching  . Imodium [Loperamide] Other (See Comments)    unknown  . Penicillins Itching  . Sulfa Antibiotics Itching  . Tea Itching    FAMILY HISTORY:  family history includes Breast cancer in her daughter; Heart disease in her father. There is no history of Colon cancer. SOCIAL HISTORY:  reports that she has quit smoking. She has never used smokeless tobacco. She reports that she does not drink alcohol or use illicit drugs.  REVIEW OF SYSTEMS:   Unable   SUBJECTIVE: obtunded   VITAL SIGNS: Temp:  [97.8 F (36.6 C)-99.6 F (37.6 C)] 99.6 F (37.6 C) (08/08 1004) Pulse Rate:  [119-147] 135 (08/08 1230) Resp:  [16-36] 21 (08/08 1230) BP: (109-155)/(63-96) 129/67 mmHg (08/08 1230) SpO2:  [70 %-100 %] 91 % (08/08 1230)  PHYSICAL EXAMINATION: General:  Acute on chronically ill appearing 81 year old female. Currently w/ poor NIPPV compliance  Neuro:  GCS 5, moves all ext but does not f/c HEENT:  + JVD, BIPAP mask in place  Cardiovascular:  Tachy irreg irreg w/ AF w/ RVR on tele Lungs:  Poor air movement w/ accessory muscle use and scattered rhonchi  Abdomen:  Soft, not tender to exam on palp, hypoactive  Musculoskeletal:  Generalized weakness  Skin:  Diffuse anasarca 4 + pitting edema    CBC No results for input(s): WBC, HGB, HCT, PLT in the last 72 hours.  Coag's No results for input(s): APTT, INR in the last 72 hours.  BMET Recent Labs     01/20/15  1022  NA  140  K  5.3*  CL  92*  CO2  40*  BUN  31*  CREATININE  0.49  GLUCOSE  126*    Electrolytes Recent Labs     01/20/15  1022  CALCIUM  9.1    Sepsis Markers No results for input(s): PROCALCITON, O2SATVEN in the last 72 hours.  Invalid input(s): LACTICACIDVEN  ABG Recent Labs     01/20/15  1011  PHART  7.278*  PCO2ART  94.3*  PO2ART  112*    Liver Enzymes Recent Labs     01/20/15  1022  AST  53*  ALT  45  ALKPHOS  99  BILITOT  0.9  ALBUMIN  3.1*    Cardiac  Enzymes No results for input(s): TROPONINI, PROBNP in the last 72 hours.  Glucose No results for input(s): GLUCAP in the last 72 hours.  Imaging Dg Chest Port 1 View  01/20/2015   CLINICAL DATA:  Shortness of breath.  EXAM: PORTABLE CHEST - 1 VIEW  COMPARISON:  12/29/2014  FINDINGS: The patient has hazy bilateral perihilar infiltrates with slight pulmonary vascular congestion. There are persistent moderate bilateral pleural effusions with compressive atelectasis in the lower lobes as demonstrated on prior chest CT dated 11/12/2014.  Severe arthritic changes of the right shoulder.  IMPRESSION: Findings consistent congestive heart failure with new bilateral pulmonary edema and increased bilateral pleural effusions and lower lobe compressive atelectasis.   Electronically  Signed   By: Lorriane Shire M.D.   On: 01/20/2015 10:46  Bilateral pulmonary edema w/ bilateral pleural effusions left > right. Left appears as though it could be partially loculated.   ASSESSMENT / PLAN:  Acute on chronic Hypoxic and Hypercarbic respiratory failure in the setting of decompensated diastolic HF and pulmonary edema, superimposed on underlying COPD. Can't exclude HCAP, but suspect her heart failure is the driving force here. She does not seem to be tolerating NIPPV well.  Has had 30 lb wt gain since July.   Plan Cont NIPPV for now. Have made some adjustments. Will recheck ABG, if no improvement in blood gas her daughters are prepared to transition to palliative goals as to avoid Ms Rachels passing away on NIPPV.  Lasix as BP, BUN and creatinine will tolerate Scheduled nebs Ok to cont systemic steroids Empiric abx reasonable  Will send PCT, to limit unnecessary abx exposure.  Confirmed DNR  Acute encephalopathy in setting of acute on chronic hypercarbia. This is superimposed on what is labeled on stable dementia  Plan Cont NIPPV  Hold any sedating meds Supportive care  AF w/ RVR: almost certain a response to her  acute illness.  Plan Rate control per IM  On xarelto, not sure her MS will allow her to take this.   Hyperkalemia  Plan Lasix  Hold additional K supplementation   Protein calorie malnutrition  Plan This will need to be addressed if she survives the next 24 hrs   Appears like she may be actively dying. Had long discussion w/ her daughters at bedside. They understand that there is little else to offer here other than time. We will cont current care as outlined. Will repeat ABG in a couple of hours. If making some progress we will cont supportive care, if not we will transition to comfort. We have confirmed DNR and DNI status.   Erick Colace ACNP-BC [place] Pager # [phone] OR # [phone] if no answer   01/20/2015, 12:54 PM

## 2015-01-20 NOTE — ED Notes (Signed)
DG at bedside. 

## 2015-01-20 NOTE — ED Notes (Signed)
Attempt to start additional IV for blood draw unsuccessful; Jennifer Nielsen at bedside attempting straight stick for blood draw.

## 2015-01-20 NOTE — ED Provider Notes (Signed)
CSN: [id number]     Arrival date & time 01/20/15  0940 History   First MD Initiated Contact with Patient 01/20/15 [phone]     Chief Complaint  Patient presents with  . Shortness of Breath  . Abdominal Pain     (Consider location/radiation/quality/duration/timing/severity/associated sxs/prior Treatment) HPI Comments: 81 year old female with past medical history including CHF, COPD, atrial fibrillation, dementia who presents with shortness of breath. History limited due to the patient's dementia and obtained primarily from EMS. They report that the patient complained of lower abdominal pain since yesterday. She had new onset shortness of breath this morning and was sent from her nursing home. She was noted to be hypoxic and tachycardic but her O2 sat is 73% corrected to 100% with a nonrebreather. Daughter has been out of town and is unaware of any recent illness at the nursing facility.  Patient is DO NOT RESUSCITATE/DO NOT INTUBATE confirmed by daughter.  Patient is a 81 y.o. female presenting with shortness of breath and abdominal pain. The history is provided by the EMS personnel.  Shortness of Breath Associated symptoms: abdominal pain   Abdominal Pain Associated symptoms: shortness of breath     Past Medical History  Diagnosis Date  . Arrhythmia   . Arthritis   . Atrial fibrillation     Remains stable (As of 05/09/13)  . H/O blood clots   . Hyperlipemia   . GERD (gastroesophageal reflux disease)     Stable (As of 05/09/13)  . Osteoarthritis     Denies pain (As of 05/09/13)  . CHF (congestive heart failure)     chronic diastolic  . COPD (chronic obstructive pulmonary disease)     w/exacerbation  . Dementia     Remains stable & continues to function adequately in the current living environment with supervision. Has had Casilda Pickerill changes in behavior (AS OF 05/09/13)  . Pleural effusion 11/12/2014  . S/P thoracentesis    Past Surgical History  Procedure Laterality Date  .  Appendectomy    . Gallbladder surgery    . Hip surgery      Right-Metal plate   . Leg surgery      Left leg   Family History  Problem Relation Age of Onset  . Colon cancer Neg Hx   . Heart disease Father   . Breast cancer Daughter    History  Substance Use Topics  . Smoking status: Former Research scientist (life sciences)  . Smokeless tobacco: Never Used  . Alcohol Use: No   OB History    No data available     Review of Systems  Unable to perform ROS: Mental status change  Respiratory: Positive for shortness of breath.   Gastrointestinal: Positive for abdominal pain.      Allergies  Chocolate; Ciprofloxacin; Coffee bean extract; Coffee flavor; Eggs or egg-derived products; Gentamycin; Imodium; Penicillins; Sulfa antibiotics; and Tea  Home Medications   Prior to Admission medications   Medication Sig Start Date End Date Taking? Authorizing Provider  acetaminophen (TYLENOL) 500 MG tablet Take 500 mg by mouth 3 (three) times daily.    Yes Historical Provider, MD  AMBULATORY NON FORMULARY MEDICATION Medication Name: Med Pass, 120 mLs by mouth twice daily   Yes Historical Provider, MD  antiseptic oral rinse (CPC / CETYLPYRIDINIUM CHLORIDE 0.05%) 0.05 % LIQD solution 7 mLs by Mouth Rinse route 2 times daily at 12 noon and 4 pm. 01/02/15  Yes Robbie Lis, MD  budesonide-formoterol [facility]) 160-4.5 MCG/ACT inhaler Inhale 2 puffs into the  lungs 2 (two) times daily.   Yes Historical Provider, MD  Calcium Carbonate-Vitamin D (CALTRATE 600+D PO) Take 1 tablet by mouth every morning. For calcium supplement   Yes Historical Provider, MD  diltiazem (CARDIZEM CD) 180 MG 24 hr capsule Take 1 capsule (180 mg total) by mouth daily. For A-Fib 11/20/14  Yes Velvet Bathe, MD  Docusate Sodium (DSS) 100 MG CAPS Take 200 mg by mouth daily. For constipation 07/02/13  Yes Bobby Rumpf York, PA-C  furosemide (LASIX) 40 MG tablet Take 40 mg by mouth 2 (two) times daily.   Yes Historical Provider, MD  ipratropium-albuterol  (DUONEB) 0.5-2.5 (3) MG/3ML SOLN 66ml nebulized 4 times daily scheduled and every 4hrs as needed for wheezing for 3 days. And then every 4hours as needed for wheezing. Patient taking differently: Take 3 mLs by mouth every 6 (six) hours.  09/18/14  Yes Bonnielee Haff, MD  Multiple Vitamins-Minerals (DECUBI-VITE) CAPS Take 1 capsule by mouth daily.   Yes Historical Provider, MD  OLANZapine (ZYPREXA) 5 MG tablet Take 1 tablet (5 mg total) by mouth at bedtime. 01/02/15  Yes Robbie Lis, MD  OXYGEN Inhale 2 L into the lungs continuous.    Yes Historical Provider, MD  Polyethyl Glycol-Propyl Glycol (SYSTANE OP) Place 1 drop into both eyes 2 (two) times daily. For dry eyes   Yes Historical Provider, MD  potassium chloride SA (K-DUR,KLOR-CON) 20 MEQ tablet Take 1 tablet (20 mEq total) by mouth daily. 10/12/14  Yes Theodis Blaze, MD  Rivaroxaban (XARELTO) 20 MG TABS tablet Take 20 mg by mouth daily. For DVT   Yes Historical Provider, MD   BP 111/70 mmHg  Pulse 79  Temp(Src) 99.6 F (37.6 C) (Rectal)  Resp 25  Ht 5\' 4"  (1.626 m)  Wt 214 lb 15.2 oz (97.5 kg)  BMI 36.88 kg/m2  SpO2 96% Physical Exam  Constitutional: She appears well-developed and well-nourished.  tachypneic, increased work of breathing  HENT:  Head: Normocephalic and atraumatic.  Moist mucous membranes  Eyes: Conjunctivae are normal. Pupils are equal, round, and reactive to light.  Cardiovascular: Normal heart sounds.   No murmur heard. tachycardic  Pulmonary/Chest: No stridor.  tachypneic with increased WOB and severely diminished BS b/l lung bases  Abdominal: Soft. Bowel sounds are normal. She exhibits no distension. There is no tenderness.  Musculoskeletal:  2+ pitting edema b/l LE  Neurological:  Sleepy but arouses to voice, follows very basic commands  Skin: Skin is warm and dry.  Scattered ecchymoses R arm  Psychiatric: She has a normal mood and affect. Judgment normal.  Nursing note and vitals reviewed.   ED Course   Procedures (including critical care time) Labs Review Labs Reviewed  BASIC METABOLIC PANEL - Abnormal; Notable for the following:    Potassium 5.3 (*)    Chloride 92 (*)    CO2 40 (*)    Glucose, Bld 126 (*)    BUN 31 (*)    All other components within normal limits  BRAIN NATRIURETIC PEPTIDE - Abnormal; Notable for the following:    B Natriuretic Peptide 359.7 (*)    All other components within normal limits  BLOOD GAS, ARTERIAL - Abnormal; Notable for the following:    pH, Arterial 7.278 (*)    pCO2 arterial 94.3 (*)    pO2, Arterial 112 (*)    Bicarbonate 42.7 (*)    Acid-Base Excess 10.2 (*)    All other components within normal limits  URINALYSIS, ROUTINE W REFLEX MICROSCOPIC (  NOT AT [facility]) - Abnormal; Notable for the following:    Color, Urine AMBER (*)    Hgb urine dipstick SMALL (*)    Protein, ur 30 (*)    Nitrite POSITIVE (*)    Leukocytes, UA TRACE (*)    All other components within normal limits  LIPASE, BLOOD - Abnormal; Notable for the following:    Lipase 15 (*)    All other components within normal limits  HEPATIC FUNCTION PANEL - Abnormal; Notable for the following:    Albumin 3.1 (*)    AST 53 (*)    All other components within normal limits  URINE MICROSCOPIC-ADD ON - Abnormal; Notable for the following:    Bacteria, UA MANY (*)    Casts HYALINE CASTS (*)    All other components within normal limits  CBC WITH DIFFERENTIAL/PLATELET - Abnormal; Notable for the following:    RBC 3.48 (*)    Hemoglobin 10.2 (*)    HCT 34.8 (*)    MCHC 29.3 (*)    RDW 17.8 (*)    Neutrophils Relative % 94 (*)    Lymphocytes Relative 4 (*)    Lymphs Abs 0.4 (*)    Monocytes Relative 2 (*)    All other components within normal limits  BLOOD GAS, ARTERIAL - Abnormal; Notable for the following:    pCO2 arterial 74.7 (*)    pO2, Arterial 67.8 (*)    Bicarbonate 43.7 (*)    Acid-Base Excess 16.2 (*)    All other components within normal limits  URINE CULTURE  BASIC  METABOLIC PANEL  CBC  I-STAT TROPOININ, ED    Imaging Review Dg Abd 1 View  01/20/2015   CLINICAL DATA:  Lower abdominal pain for 1 day.  Initial encounter.  EXAM: ABDOMEN - 1 VIEW  COMPARISON:  Chest in two views abdomen 11/02/2010.  FINDINGS: No free intraperitoneal air is identified. The bowel gas pattern is nonobstructive. There is a massive volume of stool throughout the colon. The patient is status post right hip replacement and fixation of a left intertrochanteric fracture. 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  IMPRESSION: No acute abnormality.  Massive volume of stool throughout the colon.   Electronically Signed   By: Inge Rise M.D.   On: 01/20/2015 14:37   Dg Chest Port 1 View  01/20/2015   CLINICAL DATA:  Shortness of breath.  EXAM: PORTABLE CHEST - 1 VIEW  COMPARISON:  12/29/2014  FINDINGS: The patient has hazy bilateral perihilar infiltrates with slight pulmonary vascular congestion. There are persistent moderate bilateral pleural effusions with compressive atelectasis in the lower lobes as demonstrated on prior chest CT dated 11/12/2014.  Severe arthritic changes of the right shoulder.  IMPRESSION: Findings consistent congestive heart failure with new bilateral pulmonary edema and increased bilateral pleural effusions and lower lobe compressive atelectasis.   Electronically Signed   By: Lorriane Shire M.D.   On: 01/20/2015 10:46     EKG Interpretation   Date/Time:  Monday January 20 2015 09:43:21 EDT Ventricular Rate:  142 PR Interval:    QRS Duration: 74 QT Interval:  296 QTC Calculation: 455 R Axis:   82 Text Interpretation:  Atrial fibrillation with rapid V-rate Borderline  right axis deviation Low voltage, precordial leads Confirmed by Armonie Mettler MD,  Lakendrick Paradis ([id number]) on 01/20/2015 10:30:22 AM      MDM   Final diagnoses:  Acute on chronic diastolic CHF (congestive heart failure)  Acute on chronic respiratory failure with hypercapnia  81 year old  female with recent hospitalization for COPD and CHF exacerbation who presents with shortness of breath from her nursing facility. Patient in respiratory distress at presentation with heart rate in the 140s, tachypnea, and O2 sat 100% on nonrebreather. She had significantly diminished breath sounds bilaterally. I performed a bedside ultrasound which showed evidence of pulmonary edema and plethoric IVC. Obtained labs listed above, placed the patient on BiPAP, and obtained portable chest x-ray. Gave the patient 1 inch Nitropaste and125 mg IV Solu-Medrol to treat for COPD.   ABG notable for acidosis with pH 7.28, CO2 94; BNP is 359, potassium 5.3. Chest x-ray shows pulmonary edema and pleural effusions. Gave the patient 80 mg IV Lasix to initiate diuresis. On reexamination, the patient is breathing more comfortably on the BiPAP, which I suspect is improving her hypercarbic respiratory failure. The patient has been admitted to stepdown for further care as she is DNR/DNI.  CRITICAL CARE Performed by: Wenda Overland Simran Bomkamp   Total critical care time: 45 minutes  Critical care time was exclusive of separately billable procedures and treating other patients.  Critical care was necessary to treat or prevent imminent or life-threatening deterioration.  Critical care was time spent personally by me on the following activities: development of treatment plan with patient and/or surrogate as well as nursing, discussions with consultants, evaluation of patient's response to treatment, examination of patient, obtaining history from patient or surrogate, ordering and performing treatments and interventions, ordering and review of laboratory studies, ordering and review of radiographic studies, pulse oximetry and re-evaluation of patient's condition.   Sharlett Iles, MD 01/20/15 [phone]

## 2015-01-20 NOTE — ED Notes (Signed)
Bed: WE31 Expected date:  Expected time:  Means of arrival:  Comments: 81 yo shortness of breath

## 2015-01-20 NOTE — H&P (Signed)
Patient Demographics  Jennifer Nielsen, is a 81 y.o. female  MRN: [id number]   DOB - Oct 26, 1924  Admit Date - 01/20/2015  Outpatient Primary MD for the patient is DAY,JAMES, MD   With History of -  Past Medical History  Diagnosis Date  . Arrhythmia   . Arthritis   . Atrial fibrillation     Remains stable (As of 05/09/13)  . H/O blood clots   . Hyperlipemia   . GERD (gastroesophageal reflux disease)     Stable (As of 05/09/13)  . Osteoarthritis     Denies pain (As of 05/09/13)  . CHF (congestive heart failure)     chronic diastolic  . COPD (chronic obstructive pulmonary disease)     w/exacerbation  . Dementia     Remains stable & continues to function adequately in the current living environment with supervision. Has had little changes in behavior (AS OF 05/09/13)  . Pleural effusion 11/12/2014  . S/P thoracentesis       Past Surgical History  Procedure Laterality Date  . Appendectomy    . Gallbladder surgery    . Hip surgery      Right-Metal plate   . Leg surgery      Left leg    in for   Chief Complaint  Patient presents with  . Shortness of Breath  . Abdominal Pain     HPI  Jennifer Nielsen  is a 81 y.o. female, 81 y.o. female with a past medical history of chronic respiratory failure on 2 L supplemental oxygen at home, chronic diastolic congestive heart failure, chronic obstructive pulmonary disease, atrial fibrillation on Xarelto, patient with multiple admissions recently , presents from nursing home secondary to altered mental status, and respiratory distress, she noticed to be lethargic, and respiratory distress at nursing home, NAD workup was significant for hypoxia, chest x-ray showing evidence of pulmonary edema, ABG showing PCO2 of 94, pH of 7.2, daughters at bedside expresses patient DO NOT RESUSCITATE/DO NOT INTUBATE CODE STATUS, patient started on BiPAP, received IV Lasix in ED, IV Solu-Medrol, nebulizer treatment, minimal improvement of her symptoms,  patient is lethargic, can't provide history or review of system, hospitalist called to admit.    Review of Systems    Unable to provide review of system giving altered mental status   Social History History  Substance Use Topics  . Smoking status: Former Research scientist (life sciences)  . Smokeless tobacco: Never Used  . Alcohol Use: No   Family History Family History  Problem Relation Age of Onset  . Colon cancer Neg Hx   . Heart disease Father   . Breast cancer Daughter      Prior to Admission medications   Medication Sig Start Date End Date Taking? Authorizing Provider  acetaminophen (TYLENOL) 500 MG tablet Take 500 mg by mouth 3 (three) times daily.    Yes Historical Provider, MD  AMBULATORY NON FORMULARY MEDICATION Medication Name: Med Pass, 120 mLs by mouth twice daily   Yes Historical Provider, MD  antiseptic oral rinse (CPC / CETYLPYRIDINIUM CHLORIDE 0.05%) 0.05 % LIQD solution 7 mLs by Mouth Rinse route 2 times daily at 12 noon and 4 pm. 01/02/15  Yes Robbie Lis, MD  budesonide-formoterol [facility]) 160-4.5 MCG/ACT inhaler Inhale 2 puffs into the lungs 2 (two) times daily.   Yes Historical Provider, MD  Calcium Carbonate-Vitamin D (CALTRATE 600+D PO) Take 1 tablet by mouth every morning. For calcium supplement   Yes Historical Provider, MD  diltiazem (CARDIZEM  CD) 180 MG 24 hr capsule Take 1 capsule (180 mg total) by mouth daily. For A-Fib 11/20/14  Yes Velvet Bathe, MD  Docusate Sodium (DSS) 100 MG CAPS Take 200 mg by mouth daily. For constipation 07/02/13  Yes Bobby Rumpf York, PA-C  furosemide (LASIX) 40 MG tablet Take 40 mg by mouth 2 (two) times daily.   Yes Historical Provider, MD  ipratropium-albuterol (DUONEB) 0.5-2.5 (3) MG/3ML SOLN 5ml nebulized 4 times daily scheduled and every 4hrs as needed for wheezing for 3 days. And then every 4hours as needed for wheezing. Patient taking differently: Take 3 mLs by mouth every 6 (six) hours.  09/18/14  Yes Bonnielee Haff, MD  Multiple  Vitamins-Minerals (DECUBI-VITE) CAPS Take 1 capsule by mouth daily.   Yes Historical Provider, MD  OLANZapine (ZYPREXA) 5 MG tablet Take 1 tablet (5 mg total) by mouth at bedtime. 01/02/15  Yes Robbie Lis, MD  OXYGEN Inhale 2 L into the lungs continuous.    Yes Historical Provider, MD  Polyethyl Glycol-Propyl Glycol (SYSTANE OP) Place 1 drop into both eyes 2 (two) times daily. For dry eyes   Yes Historical Provider, MD  potassium chloride SA (K-DUR,KLOR-CON) 20 MEQ tablet Take 1 tablet (20 mEq total) by mouth daily. 10/12/14  Yes Theodis Blaze, MD  Rivaroxaban (XARELTO) 20 MG TABS tablet Take 20 mg by mouth daily. For DVT   Yes Historical Provider, MD    Allergies  Allergen Reactions  . Chocolate Itching  . Ciprofloxacin Itching  . Coffee Bean Extract [Coffea Arabica] Itching  . Coffee Flavor Itching  . Eggs Or Egg-Derived Products   . Gentamycin [Gentamicin] Itching  . Imodium [Loperamide] Other (See Comments)    unknown  . Penicillins Itching  . Sulfa Antibiotics Itching  . Tea Itching    Physical Exam  Vitals  Blood pressure 123/77, pulse 122, temperature 99.6 F (37.6 C), temperature source Rectal, resp. rate 16, SpO2 95 %.   1. General , frail, elderly, ill-appearing female lying in bed in respiratory distress.  2. Obtundent, grimaces to sternal rub.  3. Unable to assess neurological exam appropriately giving patient mental status.  4. Ears and Eyes appear Normal, Conjunctivae . Dry Oral Mucosa.  5. Supple Neck, no thickened 4 JVD.  6. Symmetrical Chest wall movement, tachypnea, use of accessory respiratory muscle, poor respiratory effort.  7. No Gallops, Rubs or Murmurs, No Parasternal Heave.  8. Positive Bowel Sounds, Abdomen Soft, No tenderness.  9.  No Cyanosis, senile purpura +, +3 bilateral lower extremity edema.      Data Review  CBC No results for input(s): WBC, HGB, HCT, PLT, MCV, MCH, MCHC, RDW, LYMPHSABS, MONOABS, EOSABS, BASOSABS, BANDABS in  the last 168 hours.  Invalid input(s): NEUTRABS, BANDSABD ------------------------------------------------------------------------------------------------------------------  Chemistries   Recent Labs Lab 01/20/15 1022  NA 140  K 5.3*  CL 92*  CO2 40*  GLUCOSE 126*  BUN 31*  CREATININE 0.49  CALCIUM 9.1  AST 53*  ALT 45  ALKPHOS 99  BILITOT 0.9   ------------------------------------------------------------------------------------------------------------------ CrCl cannot be calculated (Unknown ideal weight.). ------------------------------------------------------------------------------------------------------------------ No results for input(s): TSH, T4TOTAL, T3FREE, THYROIDAB in the last 72 hours.  Invalid input(s): FREET3   Coagulation profile No results for input(s): INR, PROTIME in the last 168 hours. ------------------------------------------------------------------------------------------------------------------- No results for input(s): DDIMER in the last 72 hours. -------------------------------------------------------------------------------------------------------------------  Cardiac Enzymes No results for input(s): CKMB, TROPONINI, MYOGLOBIN in the last 168 hours.  Invalid input(s): CK ------------------------------------------------------------------------------------------------------------------ Invalid input(s): POCBNP   ---------------------------------------------------------------------------------------------------------------  Urinalysis    Component Value Date/Time   COLORURINE AMBER* 01/20/2015 [place] 01/20/2015 1143   LABSPEC 1.024 01/20/2015 1143   PHURINE 5.5 01/20/2015 1143   GLUCOSEU NEGATIVE 01/20/2015 1143   HGBUR SMALL* 01/20/2015 1143   BILIRUBINUR NEGATIVE 01/20/2015 1143   KETONESUR NEGATIVE 01/20/2015 1143   PROTEINUR 30* 01/20/2015 1143   UROBILINOGEN 1.0 01/20/2015 1143   NITRITE POSITIVE* 01/20/2015  1143   LEUKOCYTESUR TRACE* 01/20/2015 1143    ----------------------------------------------------------------------------------------------------------------  Imaging results:   Dg Chest Port 1 View  01/20/2015   CLINICAL DATA:  Shortness of breath.  EXAM: PORTABLE CHEST - 1 VIEW  COMPARISON:  12/29/2014  FINDINGS: The patient has hazy bilateral perihilar infiltrates with slight pulmonary vascular congestion. There are persistent moderate bilateral pleural effusions with compressive atelectasis in the lower lobes as demonstrated on prior chest CT dated 11/12/2014.  Severe arthritic changes of the right shoulder.  IMPRESSION: Findings consistent congestive heart failure with new bilateral pulmonary edema and increased bilateral pleural effusions and lower lobe compressive atelectasis.   Electronically Signed   By: Lorriane Shire M.D.   On: 01/20/2015 10:46    My personal review of EKG: Rhythm A FIB, Rate  144 /min, QTc 455 , no Acute ST changes    Assessment & Plan  Active Problems:   Long term current use of anticoagulant therapy   Atrial fibrillation   COPD exacerbation   Acute on chronic respiratory failure   Hyperkalemia   Acute on chronic diastolic CHF (congestive heart failure)   Congestive heart failure  Acute on chronic hypercarbic respiratory failure - On 2 L nasal cannula at baseline secondary to baseline COPD and chronic diastolic CHF - Presents with PCO2 of 94, patient is DO NOT RESUSCITATE/DO NOT INTUBATE, her symptoms secondary to pulmonary edema, and COPD exacerbation. - Continue with BiPAP as discussed with the family, pulmonary consult appreciated, will repeat ABG at 3 PM, and reassess if BiPAP is helping or not.  Acute on chronic diastolic CHF - Patient with known baseline chronic diastolic CHF, will start on IV Lasix 80 mg every 12 hours, daily weights, strict ins and outs.  Acute COPD exacerbation - Continue with IV Solu-Medrol, nebs, pulmonary toilet  A. fib  with RVR - Heartrate on the higher side, if remains elevated will start on Cardizem drip, patient on anticoagulation with Xarelto, will hold for now as she is nothing by mouth, will reassess as she may need heparin drip if remains nothing by mouth.  Hyperkalemia - Would improve with IV Lasix  DVT Prophylaxispatient on Xarelto   AM Labs Ordered, also please review Full Orders  Family Communication: Admission, patients condition and plan of care including tests being ordered have been discussed with daughters who indicate understanding and agree with the plan and Code Status.  Code Status DNR/DNI  Likely DC to  Admit to stepdown  Condition GUARDED/critical  Time spent in minutes : 65 minutes    Fynn Adel M.D on 01/20/2015 at 2:06 PM  Between 7am to 7pm - Pager - [phone]  After 7pm go to [email or web address] - password [email or web address]  And look for the night coverage person covering me after hours  [facility] Office  [phone]

## 2015-01-20 NOTE — ED Notes (Signed)
Main lab phlebotomy at bedside.

## 2015-01-20 NOTE — ED Notes (Signed)
Per EMS pt complaint of lower abdominal pain onset yesterday after; new onset SOB this am. Pt from [facility].

## 2015-01-21 DIAGNOSIS — E875 Hyperkalemia: Secondary | ICD-10-CM

## 2015-01-21 LAB — BASIC METABOLIC PANEL
ANION GAP: 12 (ref 5–15)
BUN: 33 mg/dL — ABNORMAL HIGH (ref 6–20)
CO2: 41 mmol/L — AB (ref 22–32)
Calcium: 8.8 mg/dL — ABNORMAL LOW (ref 8.9–10.3)
Chloride: 91 mmol/L — ABNORMAL LOW (ref 101–111)
Creatinine, Ser: 0.58 mg/dL (ref 0.44–1.00)
GFR calc Af Amer: >60 mL/min (ref >60)
Glucose, Bld: 137 mg/dL — ABNORMAL HIGH (ref 65–99)
Potassium: 4.4 mmol/L (ref 3.5–5.1)
SODIUM: 144 mmol/L (ref 135–145)

## 2015-01-21 LAB — CBC
HEMATOCRIT: 33.9 % — AB (ref 36.0–46.0)
Hemoglobin: 10.2 g/dL — ABNORMAL LOW (ref 12.0–15.0)
MCH: 29.2 pg (ref 26.0–34.0)
MCHC: 30.1 g/dL (ref 30.0–36.0)
MCV: 97.1 fL (ref 78.0–100.0)
Platelets: 277 10*3/uL (ref 150–400)
RBC: 3.49 MIL/uL — ABNORMAL LOW (ref 3.87–5.11)
RDW: 17.6 % — ABNORMAL HIGH (ref 11.5–15.5)
WBC: 4.5 10*3/uL (ref 4.0–10.5)

## 2015-01-21 MED ORDER — IPRATROPIUM BROMIDE 0.02 % IN SOLN
0.5000 mg | Freq: Four times a day (QID) | RESPIRATORY_TRACT | Status: DC
  Administered 2015-01-21 – 2015-01-22 (×5): 0.5 mg via RESPIRATORY_TRACT
  Filled 2015-01-21 (×5): qty 2.5

## 2015-01-21 MED ORDER — DILTIAZEM HCL 100 MG IV SOLR
5.0000 mg/h | INTRAVENOUS | Status: DC
  Administered 2015-01-21: 5 mg/h via INTRAVENOUS
  Filled 2015-01-21: qty 100

## 2015-01-21 MED ORDER — DILTIAZEM LOAD VIA INFUSION
5.0000 mg | Freq: Once | INTRAVENOUS | Status: AC
  Administered 2015-01-21: 5 mg via INTRAVENOUS
  Filled 2015-01-21: qty 5

## 2015-01-21 MED ORDER — RIVAROXABAN 20 MG PO TABS
20.0000 mg | ORAL_TABLET | Freq: Every day | ORAL | Status: DC
  Administered 2015-01-21 – 2015-01-26 (×6): 20 mg via ORAL
  Filled 2015-01-21 (×7): qty 1

## 2015-01-21 MED ORDER — PANTOPRAZOLE SODIUM 40 MG PO TBEC
40.0000 mg | DELAYED_RELEASE_TABLET | Freq: Every day | ORAL | Status: DC
  Administered 2015-01-21 – 2015-01-28 (×8): 40 mg via ORAL
  Filled 2015-01-21 (×8): qty 1

## 2015-01-21 MED ORDER — DILTIAZEM HCL ER COATED BEADS 180 MG PO CP24
180.0000 mg | ORAL_CAPSULE | Freq: Every day | ORAL | Status: DC
  Administered 2015-01-21 – 2015-01-28 (×8): 180 mg via ORAL
  Filled 2015-01-21 (×9): qty 1

## 2015-01-21 MED ORDER — PREDNISONE 20 MG PO TABS
60.0000 mg | ORAL_TABLET | Freq: Every day | ORAL | Status: DC
  Administered 2015-01-22: 60 mg via ORAL
  Filled 2015-01-21: qty 3

## 2015-01-21 MED ORDER — LEVALBUTEROL HCL 1.25 MG/0.5ML IN NEBU
1.2500 mg | INHALATION_SOLUTION | Freq: Four times a day (QID) | RESPIRATORY_TRACT | Status: DC
  Administered 2015-01-21 – 2015-01-22 (×5): 1.25 mg via RESPIRATORY_TRACT
  Filled 2015-01-21 (×5): qty 0.5

## 2015-01-21 NOTE — Progress Notes (Signed)
Patient was placed on 2 L [place] and is comfortable. Patient's current O2 saturation is 95%.  BiPAP is at bedside. RT will continue to monitor patient.

## 2015-01-21 NOTE — Progress Notes (Signed)
Name: Jennifer Nielsen MRN: [id number] DOB: [birth year]    ADMISSION DATE:  01/20/2015 CONSULTATION DATE:  8/8   REFERRING MD :  Coralyn Pear   CHIEF COMPLAINT:  Acute on chronic respiratory failure   SUBJECTIVE: Patient remained on BiPAP overnight but transitioned to [place] oxygen this morning. She denies any dyspnea at present. She denies any abdominal pain or nausea. No chest pain or pressure.   ROS:  Denies any fever or chills. No sore throat or sinus congestion.  VITAL SIGNS: Temp:  [98.2 F (36.8 C)-99.6 F (37.6 C)] 98.2 F (36.8 C) (08/09 0800) Pulse Rate:  [79-161] 93 (08/09 0733) Resp:  [13-33] 31 (08/09 0733) BP: (99-153)/(58-139) 126/58 mmHg (08/09 0733) SpO2:  [91 %-99 %] 98 % (08/09 0733) FiO2 (%):  [40 %] 40 % (08/09 0733) Weight:  [214 lb 15.2 oz (97.5 kg)] 214 lb 15.2 oz (97.5 kg) (08/08 1600)  PHYSICAL EXAMINATION: General:  Laying in bed. No distress. Awake & alert. Neuro:  Following commands. Oriented to person, year, president, & season. HEENT:  No edema. Tacky MM. No icterus. Cardiovascular:  Reg rate. Irreg rhythm. Unable to appreciate JVD. Lungs:  Improved aeration bilateral bases. Normal WOB on [place] oxygen.  Abdomen:  Soft. Nontender. Normal bowel sounds. Integument:  Warm & dry. No rash.  CBC Recent Labs     01/20/15  1405  01/21/15  0420  WBC  8.2  4.5  HGB  10.2*  10.2*  HCT  34.8*  33.9*  PLT  275  277    Coag's No results for input(s): APTT, INR in the last 72 hours.  BMET Recent Labs     01/20/15  1022  01/21/15  0420  NA  140  144  K  5.3*  4.4  CL  92*  91*  CO2  40*  41*  BUN  31*  33*  CREATININE  0.49  0.58  GLUCOSE  126*  137*    Electrolytes Recent Labs     01/20/15  1022  01/21/15  0420  CALCIUM  9.1  8.8*    Sepsis Markers No results for input(s): PROCALCITON, O2SATVEN in the last 72 hours.  Invalid input(s): LACTICACIDVEN  ABG Recent Labs     01/20/15  1011  01/20/15  1453  PHART  7.278*  7.385  PCO2ART  94.3*   74.7*  PO2ART  112*  67.8*    Liver Enzymes Recent Labs     01/20/15  1022  AST  53*  ALT  45  ALKPHOS  99  BILITOT  0.9  ALBUMIN  3.1*    Cardiac Enzymes No results for input(s): TROPONINI, PROBNP in the last 72 hours.  Glucose No results for input(s): GLUCAP in the last 72 hours.  Imaging Dg Abd 1 View  01/20/2015   CLINICAL DATA:  Lower abdominal pain for 1 day.  Initial encounter.  EXAM: ABDOMEN - 1 VIEW  COMPARISON:  Chest in two views abdomen 11/02/2010.  FINDINGS: No free intraperitoneal air is identified. The bowel gas pattern is nonobstructive. There is a massive volume of stool throughout the colon. The patient is status post right hip replacement and fixation of a left intertrochanteric fracture. 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  IMPRESSION: No acute abnormality.  Massive volume of stool throughout the colon.   Electronically Signed   By: Inge Rise M.D.   On: 01/20/2015 14:37   Dg Chest Port 1 View  01/20/2015   CLINICAL DATA:  Shortness  of breath.  EXAM: PORTABLE CHEST - 1 VIEW  COMPARISON:  12/29/2014  FINDINGS: The patient has hazy bilateral perihilar infiltrates with slight pulmonary vascular congestion. There are persistent moderate bilateral pleural effusions with compressive atelectasis in the lower lobes as demonstrated on prior chest CT dated 11/12/2014.  Severe arthritic changes of the right shoulder.  IMPRESSION: Findings consistent congestive heart failure with new bilateral pulmonary edema and increased bilateral pleural effusions and lower lobe compressive atelectasis.   Electronically Signed   By: Lorriane Shire M.D.   On: 01/20/2015 10:46  Bilateral pulmonary edema w/ bilateral pleural effusions left > right. Left appears as though it could be partially loculated.   ASSESSMENT / PLAN: 1.  Encephalopathy/Altered Mental Status:  Secondary to hypercarbia & hypoxia. Resolved. 2.  Acute Hypoxic Respiratory Failure:  Continue to wean FiO2  for Sat 88-92%. Continuing Lasix diuresis per primary service. BiPAP prn for increased WOB. 3.  Acute Hypercarbic Respiratory Failure:  BiPAP prn for increased WOB. Continuing Atrovent & Xopenex nebs qid. Switching Solu-Medrol to Prednisone 60mg  daily. 4.  Atrial Fibrillation:  Per primary service. Holding systemic anticoagulation (Xarelto). Monitoring on Tele. 5.  Hyperkalemia:  Resolved. 6.  Protein-Calorie Malnutrition:  Per primary service. 6.  Prophylaxis:  SCDs & Protonix PO. Holding chem ppx given Xarelto therapy as outpt. 7.  Diet:  Clear liquid diet as tol.  Today's Summary:  Patient currently weaned off of  BiPAP. Respiratory failure improving. Weaning steroids. Starting clear liquid diet as tolerated. Continuing to follow.  Sonia Baller Ashok Cordia, M.D. [facility] Pager:  [phone] After 3pm or if no response, call [phone]  01/21/2015, 10:02 AM

## 2015-01-21 NOTE — Progress Notes (Signed)
Rt changed BIPAP mask out from a medium to a large. Pt getting better vitals with large. Pt states mask feels better.

## 2015-01-21 NOTE — Care Management Note (Signed)
Case Management Note  Patient Details  Name: Jennifer Nielsen MRN: [id number] Date of Birth: 1924/10/02  Subjective/Objective:                 resp distress   Action/Plan:Date:  January 21, 2015 U.R. performed for needs and level of care. Will continue to follow for Case Management needs.  Velva Harman, RN, BSN, [place]   [phone]   Expected Discharge Date:   Verita Schneiders)               Expected Discharge Plan:  Home/Self Care  In-House Referral:  NA  Discharge planning Services  CM Consult  Post Acute Care Choice:  NA Choice offered to:  NA  DME Arranged:    DME Agency:     HH Arranged:    HH Agency:     Status of Service:  In process, will continue to follow  Medicare Important Message Given:    Date Medicare IM Given:    Medicare IM give by:    Date Additional Medicare IM Given:    Additional Medicare Important Message give by:     If discussed at [place] of Stay Meetings, dates discussed:    Additional Comments:  Leeroy Cha, RN 01/21/2015, 10:35 AM

## 2015-01-21 NOTE — Clinical Social Work Note (Signed)
Clinical Social Work Assessment  Patient Details  Name: Jennifer Nielsen MRN: [id number] Date of Birth: 11-24-1924  Date of referral:  01/14/15               Reason for consult:  Facility Placement, Discharge Planning                Permission sought to share information with:  Facility Art therapist granted to share information::  Yes, Verbal Permission Granted  Name::        Agency::     Relationship::     Contact Information:     Housing/Transportation Living arrangements for the past 2 months:  [place] of Information:  Adult Children Patient Interpreter Needed:  None Criminal Activity/Legal Involvement Pertinent to Current Situation/Hospitalization:  No - Comment as needed Significant Relationships:  Adult Children Lives with:    Do you feel safe going back to the place where you live?  Yes Need for family participation in patient care:  Yes (Comment)  Care giving concerns:  Family reported no concerns at this time.   Social Worker assessment / plan:  Pt hospitalized on 01/20/15 with acute on chronic respiratory failure. Pt is a long term care resident from [facility]. CSW met with pt / daughter to assist with d/c planning. Daughter reports pt will return to [facility] once stable. CSW has contacted SNF and d/c plan has been confirmed. Clinicals sent to SNF for review. CSW will continue to follow to assist with d/c planning back to SNF following hospital d/c.  Employment status:  Retired Forensic scientist:    PT Recommendations:  Not assessed at this time Information / Referral to community resources:  [place]  Patient/Family's Response to care:  Pt has dementia and is unable to participate in d/c planning. Daughter would like pt to return to [facility].  Patient/Family's Understanding of and Emotional Response to Diagnosis, Current Treatment, and Prognosis: Daughter has a good understanding of pt's medical  condition. Pt's mood is bright. She is pleasant and talkative. Daughter is very caring and supportive.  Emotional Assessment Appearance:  Appears stated age Attitude/Demeanor/Rapport:  Other (cooperative) Affect (typically observed):  Pleasant, Happy Orientation:  Oriented to Self Alcohol / Substance use:  Not Applicable Psych involvement (Current and /or in the community):  No (Comment)  Discharge Needs  Concerns to be addressed:  Discharge Planning Concerns Readmission within the last 30 days:  Yes Current discharge risk:  None Barriers to Discharge:  No Barriers Identified   Luretha Rued, [place] 01/21/2015, 11:32 AM

## 2015-01-21 NOTE — Progress Notes (Signed)
Pt currently on 2 LPM [place] and tolerating well at this time, Pt in no distress at this time.  RT will hold BIPAP for now.  RT to monitor and assess as needed.

## 2015-01-21 NOTE — Progress Notes (Signed)
Patient Demographics  Jennifer Nielsen, is a 81 y.o. female, DOB - 1925/01/25, GBT:[id number]  Admit date - 01/20/2015   Admitting Physician Albertine Patricia, MD  Outpatient Primary MD for the patient is DAY,JAMES, MD  LOS - 1   Chief Complaint  Patient presents with  . Shortness of Breath  . Abdominal Pain       Admission HPI/Brief narrative:  81 y.o. female withhistory of chronic respiratory failure on 2 L oxygen , chronic diastolic congestive heart failure, chronic obstructive pulmonary disease, atrial fibrillation on Xarelto,with multiple admissions recently , presents due to acute hypoxic resp failure secondary to COPD and CHF, significant improvement on BiPAP. Subjective:   Jennifer Nielsen today has, No headache, No chest pain, No abdominal pain - No Nausea, No Cough - SOB.   Assessment & Plan    Active Problems:   Long term current use of anticoagulant therapy   Atrial fibrillation   COPD exacerbation   Acute on chronic respiratory failure   Hyperkalemia   Acute on chronic diastolic CHF (congestive heart failure)   Congestive heart failure  Acute on chronic hypercarbic respiratory failure - On 2 L nasal cannula at baseline secondary to baseline COPD and chronic diastolic CHF - Presents with PCO2 of 94, patient is DO NOT RESUSCITATE/DO NOT INTUBATE, her symptoms secondary to pulmonary edema, and COPD exacerbation. - Agent on BiPAP for the first 24 hours, with significant improvement of mental status and breathing.  Acute on chronic diastolic CHF - Patient with known baseline chronic diastolic CHF,  - IV Lasix 80 mg every 12 hours, decrease dose tomorrow if continues to improve. - daily weights, strict ins and outs.  Acute COPD exacerbation - IV steroids changed to oral steroids today, continue with nebs.  A. fib with RVR - Patient started on Cardizem drip overnight given A. fib with  RVR, start her back on her home dose Cardizem, hopefully we can wean her off Cardizem drip. - Resumed on Xarelto as tolerating oral intake now.  Hyperkalemia -  improved with IV Lasix  Code Status: DO NOT RESUSCITATE  Family Communication: None at bedside  Disposition Plan: SNF when stable   Procedures None   Consults   Pulmonary critical care   Medications  Scheduled Meds: . antiseptic oral rinse  7 mL Mouth Rinse BID  . furosemide  80 mg Intravenous Q12H  . ipratropium  0.5 mg Nebulization QID  . levalbuterol  1.25 mg Nebulization QID  . pantoprazole  40 mg Oral Daily  . [START ON 01/22/2015] predniSONE  60 mg Oral Q breakfast   Continuous Infusions: . diltiazem (CARDIZEM) infusion Stopped (01/21/15 1200)   PRN Meds:.acetaminophen **OR** acetaminophen, albuterol, bisacodyl, ondansetron **OR** ondansetron (ZOFRAN) IV, polyvinyl alcohol  DVT Prophylaxis  Xarelto  Lab Results  Component Value Date   PLT 277 01/21/2015    Antibiotics   Anti-infectives    None          Objective:   Filed Vitals:   01/21/15 1000 01/21/15 1100 01/21/15 1131 01/21/15 1200  BP: 101/55 105/45 105/45   Pulse: 94 97 93 94  Temp:      TempSrc:      Resp: 32 27 25 17   Height:  Weight:      SpO2: 100% 95% 96% 96%    Wt Readings from Last 3 Encounters:  01/20/15 97.5 kg (214 lb 15.2 oz)  01/02/15 91.8 kg (202 lb 6.1 oz)  12/23/14 92.08 kg (203 lb)     Intake/Output Summary (Last 24 hours) at 01/21/15 1250 Last data filed at 01/21/15 1200  Gross per 24 hour  Intake     85 ml  Output   1875 ml  Net  -1790 ml     Physical Exam  Big difference from yesterday, awake alert pleasant communicative, confused Goff.AT, ++ JVD, No cervical lymphadenopathy appriciated.  Symmetrical Chest wall movement, good air movement bilaterally, no wheezing irregular,No Gallops,Rubs or new Murmurs, No Parasternal Heave +ve B.Sounds, Abd Soft, No tenderness, No organomegaly  appriciated, No Cyanosis, Clubbing +3 edema   Data Review   Micro Results Recent Results (from the past 240 hour(s))  Urine culture     Status: None (Preliminary result)   Collection Time: 01/20/15 11:43 AM  Result Value Ref Range Status   Specimen Description URINE, CATHETERIZED  Final   Special Requests Normal  Final   Culture   Final    >=100,000 COLONIES/mL PSEUDOMONAS AERUGINOSA Performed at [facility]    Report Status PENDING  Incomplete    Radiology Reports Dg Abd 1 View  01/20/2015   CLINICAL DATA:  Lower abdominal pain for 1 day.  Initial encounter.  EXAM: ABDOMEN - 1 VIEW  COMPARISON:  Chest in two views abdomen 11/02/2010.  FINDINGS: No free intraperitoneal air is identified. The bowel gas pattern is nonobstructive. There is a massive volume of stool throughout the colon. The patient is status post right hip replacement and fixation of a left intertrochanteric fracture. 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  IMPRESSION: No acute abnormality.  Massive volume of stool throughout the colon.   Electronically Signed   By: Inge Rise M.D.   On: 01/20/2015 14:37   Dg Chest Port 1 View  01/20/2015   CLINICAL DATA:  Shortness of breath.  EXAM: PORTABLE CHEST - 1 VIEW  COMPARISON:  12/29/2014  FINDINGS: The patient has hazy bilateral perihilar infiltrates with slight pulmonary vascular congestion. There are persistent moderate bilateral pleural effusions with compressive atelectasis in the lower lobes as demonstrated on prior chest CT dated 11/12/2014.  Severe arthritic changes of the right shoulder.  IMPRESSION: Findings consistent congestive heart failure with new bilateral pulmonary edema and increased bilateral pleural effusions and lower lobe compressive atelectasis.   Electronically Signed   By: Lorriane Shire M.D.   On: 01/20/2015 10:46   Dg Chest Port 1 View  12/29/2014   CLINICAL DATA:  81 year old female with pneumonia and history of atrial  fibrillation  EXAM: PORTABLE CHEST - 1 VIEW  COMPARISON:  Prior chest x-ray 12/28/2014  FINDINGS: Stable cardiomegaly. Atherosclerotic calcifications again noted in the transverse aorta. Pulmonary vascular congestion with perhaps mild interstitial edema. Findings are slightly progressed compared to prior. Bilateral left larger than right layering pleural effusions and associated bibasilar atelectasis. Infiltrate difficult to exclude in the left base. No acute osseous abnormality.  IMPRESSION: 1. Similar to slightly increased interstitial pulmonary edema. 2. Otherwise unchanged cardiomegaly and left larger than right layering effusions with associated bibasilar atelectasis.   Electronically Signed   By: Jacqulynn Cadet M.D.   On: 12/29/2014 08:20   Dg Chest Port 1 View  12/28/2014   CLINICAL DATA:  Respiratory distress.  EXAM: PORTABLE CHEST - 1 VIEW  COMPARISON:  11/14/2014  FINDINGS: There is cardiac enlargement. Bilateral pleural effusions are identified left greater than right. There is mild diffuse interstitial edema. Atelectasis is identified overlying the effusions.  IMPRESSION: 1. Congestive heart failure.   Electronically Signed   By: Kerby Moors M.D.   On: 12/28/2014 18:26     CBC  Recent Labs Lab 01/20/15 1405 01/21/15 0420  WBC 8.2 4.5  HGB 10.2* 10.2*  HCT 34.8* 33.9*  PLT 275 277  MCV 100.0 97.1  MCH 29.3 29.2  MCHC 29.3* 30.1  RDW 17.8* 17.6*  LYMPHSABS 0.4*  --   MONOABS 0.1  --   EOSABS 0.0  --   BASOSABS 0.0  --     Chemistries   Recent Labs Lab 01/20/15 1022 01/21/15 0420  NA 140 144  K 5.3* 4.4  CL 92* 91*  CO2 40* 41*  GLUCOSE 126* 137*  BUN 31* 33*  CREATININE 0.49 0.58  CALCIUM 9.1 8.8*  AST 53*  --   ALT 45  --   ALKPHOS 99  --   BILITOT 0.9  --    ------------------------------------------------------------------------------------------------------------------ estimated creatinine clearance is 55.1 mL/min (by C-G formula based on Cr of  0.58). ------------------------------------------------------------------------------------------------------------------ No results for input(s): HGBA1C in the last 72 hours. ------------------------------------------------------------------------------------------------------------------ No results for input(s): CHOL, HDL, LDLCALC, TRIG, CHOLHDL, LDLDIRECT in the last 72 hours. ------------------------------------------------------------------------------------------------------------------ No results for input(s): TSH, T4TOTAL, T3FREE, THYROIDAB in the last 72 hours.  Invalid input(s): FREET3 ------------------------------------------------------------------------------------------------------------------ No results for input(s): VITAMINB12, FOLATE, FERRITIN, TIBC, IRON, RETICCTPCT in the last 72 hours.  Coagulation profile No results for input(s): INR, PROTIME in the last 168 hours.  No results for input(s): DDIMER in the last 72 hours.  Cardiac Enzymes No results for input(s): CKMB, TROPONINI, MYOGLOBIN in the last 168 hours.  Invalid input(s): CK ------------------------------------------------------------------------------------------------------------------ Invalid input(s): POCBNP     Time Spent in minutes   35 minutes   Ellisyn Icenhower M.D on 01/21/2015 at 12:50 PM  Between 7am to 7pm - Pager - [phone]  After 7pm go to [email or web address] - password [facility]  [facility]   Office  [phone]

## 2015-01-21 NOTE — Evaluation (Signed)
Clinical/Bedside Swallow Evaluation Patient Details  Name: Jennifer Nielsen MRN: [id number] Date of Birth: [birth year]  Today's Date: 01/21/2015 Time: SLP Start Time (ACUTE ONLY): 1253 SLP Stop Time (ACUTE ONLY): 1323 SLP Time Calculation (min) (ACUTE ONLY): 30 min  Past Medical History:  Past Medical History  Diagnosis Date  . Arrhythmia   . Arthritis   . Atrial fibrillation     Remains stable (As of 05/09/13)  . H/O blood clots   . Hyperlipemia   . GERD (gastroesophageal reflux disease)     Stable (As of 05/09/13)  . Osteoarthritis     Denies pain (As of 05/09/13)  . CHF (congestive heart failure)     chronic diastolic  . COPD (chronic obstructive pulmonary disease)     w/exacerbation  . Dementia     Remains stable & continues to function adequately in the current living environment with supervision. Has had little changes in behavior (AS OF 05/09/13)  . Pleural effusion 11/12/2014  . S/P thoracentesis    Past Surgical History:  Past Surgical History  Procedure Laterality Date  . Appendectomy    . Gallbladder surgery    . Hip surgery      Right-Metal plate   . Leg surgery      Left leg   HPI:  pt is an 81 yo female SNF resident adm to [facility] with respiratory difficulties = required Bipap.  PMH + for pna, CHF, GERD, COPD requiring thoracentesis.  Daughter present reports patient consumes regular diet except meats chopped.  Pt with large stool burden per Abd DG and Has CHF per CXR.  Daughter reports pt leans to right at baseline.     Assessment / Plan / Recommendation Clinical Impression  Functional oropharyngeal swallow ability based on clinical swallow evaluation.  Minimal increased work of breathing noted but no indications of airway compromise.  Pt self fed water, apple juice, jello and single bite of cracker.  Ill fitting dentures noted but pt does not use adhesive - Recommend dys3/chopped meats/thin liquid diet with aspiration/reflux precautions.  Educated pt and [name]  *daughter* to recommendations and precautions.  Daughter states pt has been belching more - note has large stool burden and daughter reports pt takes a reflux pills prior to admit.  No follow up indicated from SLP.  Thanks for this consult.     Aspiration Risk  Mild    Diet Recommendation Dysphagia 3 (Mech soft);Thin   Medication Administration: Whole meds with liquid (1 at a time) Compensations: Small sips/bites;Slow rate;Check for pocketing    Other  Recommendations Oral Care Recommendations: Oral care BID   Follow Up Recommendations       Frequency and Duration        Pertinent Vitals/Pain Afebrile, decreased      Swallow Study Prior Functional Status    see hhx    General Date of Onset: 01/21/15 Other Pertinent Information: pt is an 81 yo female SNF resident adm to [facility] with respiratory difficulties = required Bipap.  PMH + for pna, CHF, GERD, COPD requiring thoracentesis.  Daughter present reports patient consumes regular diet except meats chopped.  Pt with large stool burden per Abd DG and Has CHF per CXR.  Daughter reports pt leans to right at baseline.   Type of Study: Bedside swallow evaluation Diet Prior to this Study: Thin liquids (clears) Temperature Spikes Noted:  (low grade) Respiratory Status: Supplemental O2 delivered via (comment) History of Recent Intubation: No (recent Bipap) Behavior/Cognition: Alert;Cooperative;Confused Oral Cavity - Dentition:  Edentulous;Other (Comment) (dentures brushed by SLP and pt placed) Self-Feeding Abilities: Needs set up Patient Positioning: Upright in bed Baseline Vocal Quality: Normal Volitional Cough: Weak Volitional Swallow: Able to elicit    Oral/Motor/Sensory Function Overall Oral Motor/Sensory Function: Appears within functional limits for tasks assessed   Ice Chips Ice chips: Not tested   Thin Liquid Thin Liquid: Within functional limits Presentation: Cup;Self Fed;Straw    Nectar Thick Nectar Thick Liquid: Not tested    Honey Thick Honey Thick Liquid: Not tested   Puree Puree: Within functional limits (jello) Presentation: Self Fed;Spoon   Solid   GO    Solid: Impaired Presentation: Self Fed Other Comments: single bite of graham cracker- slow mastication with minimal residuals on right sulcus - removed with furhter intake, daughter states pt will orally pocket at times       Luanna Salk, [place] [facility] [place] [phone]

## 2015-01-22 LAB — CBC
HEMATOCRIT: 29.3 % — AB (ref 36.0–46.0)
Hemoglobin: 8.7 g/dL — ABNORMAL LOW (ref 12.0–15.0)
MCH: 29.2 pg (ref 26.0–34.0)
MCHC: 29.7 g/dL — ABNORMAL LOW (ref 30.0–36.0)
MCV: 98.3 fL (ref 78.0–100.0)
Platelets: 296 10*3/uL (ref 150–400)
RBC: 2.98 MIL/uL — ABNORMAL LOW (ref 3.87–5.11)
RDW: 17.8 % — ABNORMAL HIGH (ref 11.5–15.5)
WBC: 3.9 10*3/uL — AB (ref 4.0–10.5)

## 2015-01-22 LAB — BASIC METABOLIC PANEL
ANION GAP: 7 (ref 5–15)
BUN: 36 mg/dL — ABNORMAL HIGH (ref 6–20)
CALCIUM: 8.3 mg/dL — AB (ref 8.9–10.3)
CO2: 48 mmol/L — AB (ref 22–32)
Chloride: 89 mmol/L — ABNORMAL LOW (ref 101–111)
Creatinine, Ser: 0.65 mg/dL (ref 0.44–1.00)
GFR calc non Af Amer: >60 mL/min (ref >60)
Glucose, Bld: 139 mg/dL — ABNORMAL HIGH (ref 65–99)
Potassium: 3.2 mmol/L — ABNORMAL LOW (ref 3.5–5.1)
Sodium: 144 mmol/L (ref 135–145)

## 2015-01-22 LAB — URINE CULTURE: Special Requests: NORMAL

## 2015-01-22 MED ORDER — POTASSIUM CHLORIDE CRYS ER 20 MEQ PO TBCR
40.0000 meq | EXTENDED_RELEASE_TABLET | ORAL | Status: AC
  Administered 2015-01-22 (×2): 40 meq via ORAL
  Filled 2015-01-22 (×3): qty 2

## 2015-01-22 MED ORDER — MAGNESIUM SULFATE IN D5W 10-5 MG/ML-% IV SOLN
1.0000 g | Freq: Once | INTRAVENOUS | Status: AC
  Administered 2015-01-22: 1 g via INTRAVENOUS
  Filled 2015-01-22: qty 100

## 2015-01-22 MED ORDER — PREDNISONE 20 MG PO TABS
40.0000 mg | ORAL_TABLET | Freq: Every day | ORAL | Status: DC
  Administered 2015-01-23 – 2015-01-26 (×4): 40 mg via ORAL
  Filled 2015-01-22 (×4): qty 2

## 2015-01-22 MED ORDER — DEXTROSE 5 % IV SOLN
1.0000 g | Freq: Three times a day (TID) | INTRAVENOUS | Status: DC
  Administered 2015-01-22 – 2015-01-23 (×4): 1 g via INTRAVENOUS
  Filled 2015-01-22 (×5): qty 1

## 2015-01-22 MED ORDER — IPRATROPIUM-ALBUTEROL 0.5-2.5 (3) MG/3ML IN SOLN
3.0000 mL | Freq: Four times a day (QID) | RESPIRATORY_TRACT | Status: DC
  Administered 2015-01-22 (×3): 3 mL via RESPIRATORY_TRACT
  Filled 2015-01-22 (×3): qty 3

## 2015-01-22 MED ORDER — POTASSIUM CHLORIDE 20 MEQ PO PACK: 40.0000 meq | PACK | ORAL | Status: DC

## 2015-01-22 NOTE — Care Management Important Message (Signed)
Important Message  Patient Details  Name: Treazure Nery MRN: [id number] Date of Birth: October 12, 1924   Medicare Important Message Given:  Yes-second notification given    Camillo Flaming 01/22/2015, 11:42 AMImportant Message  Patient Details  Name: Peighton Mehra MRN: [id number] Date of Birth: [birth year]   Medicare Important Message Given:  Yes-second notification given    Camillo Flaming 01/22/2015, 11:42 AM

## 2015-01-22 NOTE — Progress Notes (Signed)
Patient Demographics  Jennifer Nielsen, is a 81 y.o. female, DOB - 12/09/1924, SNK:[id number]  Admit date - 01/20/2015   Admitting Physician Albertine Patricia, MD  Outpatient Primary MD for the patient is DAY,JAMES, MD  LOS - 2   Chief Complaint  Patient presents with  . Shortness of Breath  . Abdominal Pain       Admission HPI/Brief narrative:  81 y.o. female withhistory of chronic respiratory failure on 2 L oxygen , chronic diastolic congestive heart failure, chronic obstructive pulmonary disease, atrial fibrillation on Xarelto,with multiple admissions recently , presents due to acute hypoxic resp failure secondary to COPD and CHF, significant improvement on BiPAP. Subjective:   Jennifer Nielsen today has, No headache, No chest pain, No abdominal pain - No Nausea, No Cough - SOB.   Assessment & Plan    Active Problems:   Long term current use of anticoagulant therapy   Atrial fibrillation   COPD exacerbation   Acute on chronic respiratory failure   Hyperkalemia   Acute on chronic diastolic CHF (congestive heart failure)   Congestive heart failure  Acute on chronic hypercarbic respiratory failure - On 2 L nasal cannula at baseline secondary to baseline COPD and chronic diastolic CHF - Presents with PCO2 of 94, patient is DO NOT RESUSCITATE/DO NOT INTUBATE, her symptoms secondary to pulmonary edema, and COPD exacerbation. - on BiPAP for the first 24 hours, with significant improvement of mental status and breathing.back tp daseline.  Acute on chronic diastolic CHF - Patient with known baseline chronic diastolic CHF,  - continue with IV Lasix 80 mg every 12 hours, -1040 over the last 24 hours, likely will need higher dose of oral Lasix on discharge. - daily weights, strict ins and outs.  UTI - urine culture growing Pseudomonas, will start on ceftazidime  Acute COPD exacerbation - no wheezing ,  continue with steroids tapper.  A. fib with RVR - Patient started on Cardizem dripinitially A. fib with RVR, heart rate  on her home dose Cardizem. - Resumed on Xarelto as tolerating oral intake now.  Hyperkalemia/hypokalemia -  improved with IV Lasix, she now has hypokalemia, will replete, check magnesium.  Anemia - Monitor closely, check CBC in a.m., last use if needed.  Code Status: DO NOT RESUSCITATE  Family Communication: None at bedside  Disposition Plan: SNF when stable   Procedures None   Consults   Pulmonary critical care   Medications  Scheduled Meds: . antiseptic oral rinse  7 mL Mouth Rinse BID  . diltiazem  180 mg Oral Daily  . furosemide  80 mg Intravenous Q12H  . ipratropium-albuterol  3 mL Nebulization QID  . pantoprazole  40 mg Oral Daily  . potassium chloride  40 mEq Oral Q4H  . [START ON 01/23/2015] predniSONE  40 mg Oral Q breakfast  . rivaroxaban  20 mg Oral Daily   Continuous Infusions: . diltiazem (CARDIZEM) infusion Stopped (01/21/15 1200)   PRN Meds:.acetaminophen **OR** acetaminophen, albuterol, bisacodyl, ondansetron **OR** ondansetron (ZOFRAN) IV, polyvinyl alcohol  DVT Prophylaxis  Xarelto  Lab Results  Component Value Date   PLT 296 01/22/2015    Antibiotics   Anti-infectives    None          Objective:  Filed Vitals:   01/22/15 0935 01/22/15 1000 01/22/15 1122 01/22/15 1204  BP: 103/50 104/59 113/54   Pulse:  87 115   Temp:   98 F (36.7 C)   TempSrc:   Oral   Resp:  26 24   Height:      Weight:      SpO2:  98% 93% 96%    Wt Readings from Last 3 Encounters:  01/20/15 97.5 kg (214 lb 15.2 oz)  01/02/15 91.8 kg (202 lb 6.1 oz)  12/23/14 92.08 kg (203 lb)     Intake/Output Summary (Last 24 hours) at 01/22/15 1241 Last data filed at 01/22/15 1000  Gross per 24 hour  Intake    410 ml  Output    940 ml  Net   -530 ml     Physical Exam  Awake, alert, pleasant [place].AT, ++ JVD, No cervical  lymphadenopathy appriciated.  Symmetrical Chest wall movement, good air movement bilaterally, no wheezing irregular,No Gallops,Rubs or new Murmurs, No Parasternal Heave +ve B.Sounds, Abd Soft, No tenderness, No organomegaly appriciated, No Cyanosis, Clubbing +2 edema   Data Review   Micro Results Recent Results (from the past 240 hour(s))  Urine culture     Status: None   Collection Time: 01/20/15 11:43 AM  Result Value Ref Range Status   Specimen Description URINE, CATHETERIZED  Final   Special Requests Normal  Final   Culture   Final    >=100,000 COLONIES/mL PSEUDOMONAS AERUGINOSA Performed at [facility]    Report Status 01/22/2015 FINAL  Final   Organism ID, Bacteria PSEUDOMONAS AERUGINOSA  Final      Susceptibility   Pseudomonas aeruginosa - MIC*    CEFTAZIDIME 4 SENSITIVE Sensitive     CIPROFLOXACIN <=0.25 SENSITIVE Sensitive     GENTAMICIN 4 SENSITIVE Sensitive     IMIPENEM 1 SENSITIVE Sensitive     PIP/TAZO <=4 SENSITIVE Sensitive     CEFEPIME 2 SENSITIVE Sensitive     * >=100,000 COLONIES/mL PSEUDOMONAS AERUGINOSA    Radiology Reports Dg Abd 1 View  01/20/2015   CLINICAL DATA:  Lower abdominal pain for 1 day.  Initial encounter.  EXAM: ABDOMEN - 1 VIEW  COMPARISON:  Chest in two views abdomen 11/02/2010.  FINDINGS: No free intraperitoneal air is identified. The bowel gas pattern is nonobstructive. There is a massive volume of stool throughout the colon. The patient is status post right hip replacement and fixation of a left intertrochanteric fracture. 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  IMPRESSION: No acute abnormality.  Massive volume of stool throughout the colon.   Electronically Signed   By: Inge Rise M.D.   On: 01/20/2015 14:37   Dg Chest Port 1 View  01/20/2015   CLINICAL DATA:  Shortness of breath.  EXAM: PORTABLE CHEST - 1 VIEW  COMPARISON:  12/29/2014  FINDINGS: The patient has hazy bilateral perihilar infiltrates with slight  pulmonary vascular congestion. There are persistent moderate bilateral pleural effusions with compressive atelectasis in the lower lobes as demonstrated on prior chest CT dated 11/12/2014.  Severe arthritic changes of the right shoulder.  IMPRESSION: Findings consistent congestive heart failure with new bilateral pulmonary edema and increased bilateral pleural effusions and lower lobe compressive atelectasis.   Electronically Signed   By: Lorriane Shire M.D.   On: 01/20/2015 10:46   Dg Chest Port 1 View  12/29/2014   CLINICAL DATA:  81 year old female with pneumonia and history of atrial fibrillation  EXAM: PORTABLE CHEST - 1 VIEW  COMPARISON:  Prior chest x-ray 12/28/2014  FINDINGS: Stable cardiomegaly. Atherosclerotic calcifications again noted in the transverse aorta. Pulmonary vascular congestion with perhaps mild interstitial edema. Findings are slightly progressed compared to prior. Bilateral left larger than right layering pleural effusions and associated bibasilar atelectasis. Infiltrate difficult to exclude in the left base. No acute osseous abnormality.  IMPRESSION: 1. Similar to slightly increased interstitial pulmonary edema. 2. Otherwise unchanged cardiomegaly and left larger than right layering effusions with associated bibasilar atelectasis.   Electronically Signed   By: Jacqulynn Cadet M.D.   On: 12/29/2014 08:20   Dg Chest Port 1 View  12/28/2014   CLINICAL DATA:  Respiratory distress.  EXAM: PORTABLE CHEST - 1 VIEW  COMPARISON:  11/14/2014  FINDINGS: There is cardiac enlargement. Bilateral pleural effusions are identified left greater than right. There is mild diffuse interstitial edema. Atelectasis is identified overlying the effusions.  IMPRESSION: 1. Congestive heart failure.   Electronically Signed   By: Kerby Moors M.D.   On: 12/28/2014 18:26     CBC  Recent Labs Lab 01/20/15 1405 01/21/15 0420 01/22/15 0358  WBC 8.2 4.5 3.9*  HGB 10.2* 10.2* 8.7*  HCT 34.8* 33.9* 29.3*    PLT 275 277 296  MCV 100.0 97.1 98.3  MCH 29.3 29.2 29.2  MCHC 29.3* 30.1 29.7*  RDW 17.8* 17.6* 17.8*  LYMPHSABS 0.4*  --   --   MONOABS 0.1  --   --   EOSABS 0.0  --   --   BASOSABS 0.0  --   --     Chemistries   Recent Labs Lab 01/20/15 1022 01/21/15 0420 01/22/15 0358  NA 140 144 144  K 5.3* 4.4 3.2*  CL 92* 91* 89*  CO2 40* 41* 48*  GLUCOSE 126* 137* 139*  BUN 31* 33* 36*  CREATININE 0.49 0.58 0.65  CALCIUM 9.1 8.8* 8.3*  AST 53*  --   --   ALT 45  --   --   ALKPHOS 99  --   --   BILITOT 0.9  --   --    ------------------------------------------------------------------------------------------------------------------ estimated creatinine clearance is 55.1 mL/min (by C-G formula based on Cr of 0.65). ------------------------------------------------------------------------------------------------------------------ No results for input(s): HGBA1C in the last 72 hours. ------------------------------------------------------------------------------------------------------------------ No results for input(s): CHOL, HDL, LDLCALC, TRIG, CHOLHDL, LDLDIRECT in the last 72 hours. ------------------------------------------------------------------------------------------------------------------ No results for input(s): TSH, T4TOTAL, T3FREE, THYROIDAB in the last 72 hours.  Invalid input(s): FREET3 ------------------------------------------------------------------------------------------------------------------ No results for input(s): VITAMINB12, FOLATE, FERRITIN, TIBC, IRON, RETICCTPCT in the last 72 hours.  Coagulation profile No results for input(s): INR, PROTIME in the last 168 hours.  No results for input(s): DDIMER in the last 72 hours.  Cardiac Enzymes No results for input(s): CKMB, TROPONINI, MYOGLOBIN in the last 168 hours.  Invalid input(s):  CK ------------------------------------------------------------------------------------------------------------------ Invalid input(s): POCBNP     Time Spent in minutes   16minutes   ELGERGAWY, DAWOOD M.D on 01/22/2015 at 12:41 PM  Between 7am to 7pm - Pager - [phone]  After 7pm go to [email or web address] - password [facility]  [facility]   Office  [phone]

## 2015-01-22 NOTE — Progress Notes (Signed)
ANTIBIOTIC CONSULT NOTE - INITIAL  Pharmacy Consult for Ceftazidime Indication: Pseudomonas UTI  Allergies  Allergen Reactions  . Chocolate Itching  . Ciprofloxacin Itching  . Coffee Bean Extract [Coffea Arabica] Itching  . Coffee Flavor Itching  . Eggs Or Egg-Derived Products   . Gentamycin [Gentamicin] Itching  . Imodium [Loperamide] Other (See Comments)    unknown  . Penicillins Itching  . Sulfa Antibiotics Itching  . Tea Itching    Patient Measurements: Height: 5\' 4"  (162.6 cm) Weight: 214 lb 15.2 oz (97.5 kg) IBW/kg (Calculated) : 54.7  Vital Signs: Temp: 98 F (36.7 C) (08/10 1122) Temp Source: Oral (08/10 1122) BP: 113/54 mmHg (08/10 1122) Pulse Rate: 115 (08/10 1122) Intake/Output from previous day: 08/09 0701 - 08/10 0700 In: 220 [I.V.:220] Out: 1260 [Urine:1260] Intake/Output from this shift: Total I/O In: 250 [P.O.:200; I.V.:50] Out: 205 [Urine:205]  Labs:  Recent Labs  01/20/15 1022 01/20/15 1405 01/21/15 0420 01/22/15 0358  WBC  --  8.2 4.5 3.9*  HGB  --  10.2* 10.2* 8.7*  PLT  --  275 277 296  CREATININE 0.49  --  0.58 0.65   Estimated Creatinine Clearance: 55.1 mL/min (by C-G formula based on Cr of 0.65). No results for input(s): VANCOTROUGH, VANCOPEAK, VANCORANDOM, GENTTROUGH, GENTPEAK, GENTRANDOM, TOBRATROUGH, TOBRAPEAK, TOBRARND, AMIKACINPEAK, AMIKACINTROU, AMIKACIN in the last 72 hours.   Microbiology: Recent Results (from the past 720 hour(s))  Blood culture (routine x 2)     Status: None   Collection Time: 12/28/14  6:45 PM  Result Value Ref Range Status   Specimen Description BLOOD RIGHT ANTECUBITAL  Final   Special Requests BOTTLES DRAWN AEROBIC AND ANAEROBIC 10CC EA  Final   Culture   Final    NO GROWTH 5 DAYS Performed at [facility]    Report Status 01/03/2015 FINAL  Final  Blood culture (routine x 2)     Status: None   Collection Time: 12/28/14  6:48 PM  Result Value Ref Range Status   Specimen Description BLOOD  LEFT ANTECUBITAL  Final   Special Requests BOTTLES DRAWN AEROBIC AND ANAEROBIC 5CC EA  Final   Culture   Final    NO GROWTH 5 DAYS Performed at [facility]    Report Status 01/03/2015 FINAL  Final  MRSA PCR Screening     Status: None   Collection Time: 12/28/14 10:00 PM  Result Value Ref Range Status   MRSA by PCR NEGATIVE NEGATIVE Final    Comment:        The GeneXpert MRSA Assay (FDA approved for NASAL specimens only), is one component of a comprehensive MRSA colonization surveillance program. It is not intended to diagnose MRSA infection nor to guide or monitor treatment for MRSA infections.   Urine culture     Status: None   Collection Time: 01/20/15 11:43 AM  Result Value Ref Range Status   Specimen Description URINE, CATHETERIZED  Final   Special Requests Normal  Final   Culture   Final    >=100,000 COLONIES/mL PSEUDOMONAS AERUGINOSA Performed at [facility]    Report Status 01/22/2015 FINAL  Final   Organism ID, Bacteria PSEUDOMONAS AERUGINOSA  Final      Susceptibility   Pseudomonas aeruginosa - MIC*    CEFTAZIDIME 4 SENSITIVE Sensitive     CIPROFLOXACIN <=0.25 SENSITIVE Sensitive     GENTAMICIN 4 SENSITIVE Sensitive     IMIPENEM 1 SENSITIVE Sensitive     PIP/TAZO <=4 SENSITIVE Sensitive  CEFEPIME 2 SENSITIVE Sensitive     * >=100,000 COLONIES/mL PSEUDOMONAS AERUGINOSA    Medical History: Past Medical History  Diagnosis Date  . Arrhythmia   . Arthritis   . Atrial fibrillation     Remains stable (As of 05/09/13)  . H/O blood clots   . Hyperlipemia   . GERD (gastroesophageal reflux disease)     Stable (As of 05/09/13)  . Osteoarthritis     Denies pain (As of 05/09/13)  . CHF (congestive heart failure)     chronic diastolic  . COPD (chronic obstructive pulmonary disease)     w/exacerbation  . Dementia     Remains stable & continues to function adequately in the current living environment with supervision. Has had little changes in  behavior (AS OF 05/09/13)  . Pleural effusion 11/12/2014  . S/P thoracentesis     Medications:  Scheduled:  . antiseptic oral rinse  7 mL Mouth Rinse BID  . diltiazem  180 mg Oral Daily  . furosemide  80 mg Intravenous Q12H  . ipratropium-albuterol  3 mL Nebulization QID  . magnesium sulfate 1 - 4 g bolus IVPB  1 g Intravenous Once  . pantoprazole  40 mg Oral Daily  . potassium chloride  40 mEq Oral Q4H  . [START ON 01/23/2015] predniSONE  40 mg Oral Q breakfast  . rivaroxaban  20 mg Oral Daily   Infusions:  . diltiazem (CARDIZEM) infusion Stopped (01/21/15 1200)   PRN: acetaminophen **OR** acetaminophen, albuterol, bisacodyl, ondansetron **OR** ondansetron (ZOFRAN) IV, polyvinyl alcohol  Assessment: 81 yo female admitted with acute on chronic respiratory failure. Urine culture from 8/8 growing Pseudomonas aeruginosa, Pharmacy consulted to dose ceftazidime. Allergy listed to PCN, but patient has received and tolerated cefepime during previous admissions.  Goal of Therapy:  Eradication of infection Dose appropriate for indication and renal function  Plan:   Ceftazidime 1g IV q8h Follow up renal function, clinical course and duration of therapy  Peggyann Juba, PharmD, BCPS Pager: [phone] 01/22/2015,12:57 PM

## 2015-01-22 NOTE — Progress Notes (Signed)
Name: Jennifer Nielsen MRN: [id number] DOB: 1925-05-03    ADMISSION DATE:  01/20/2015 CONSULTATION DATE:  8/8   REFERRING MD :  Coralyn Pear   CHIEF COMPLAINT:  Acute on chronic respiratory failure   SUBJECTIVE: Remained off BiPAP overnight. Denies dyspnea. Tolerating taking pills. No abdominal pain or nausea.  ROS:  No chest pain or pressure. No fever or chills.  VITAL SIGNS: Temp:  [97.9 F (36.6 C)-98.7 F (37.1 C)] 97.9 F (36.6 C) (08/10 0800) Pulse Rate:  [53-108] 91 (08/10 0800) Resp:  [17-33] 23 (08/10 0800) BP: (96-123)/(43-72) 119/56 mmHg (08/10 0800) SpO2:  [88 %-100 %] 99 % (08/10 0800)  PHYSICAL EXAMINATION: General:  Sitting upright taking pills. No distress. Awake. Neuro:  Following commands. Moving all 4 extremities. HEENT:  No edema. Tacky MM. No i scleral injection. Cardiovascular:  Reg rate. Irreg rhythm. Unable to appreciate JVD in upright position. Lungs:  Mild expiratory wheeze. Normal WOB on [place] oxygen.  Abdomen:  Soft. Nontender. Normal bowel sounds. Integument:  Warm & dry. No rash.  CBC Recent Labs     01/20/15  1405  01/21/15  0420  01/22/15  0358  WBC  8.2  4.5  3.9*  HGB  10.2*  10.2*  8.7*  HCT  34.8*  33.9*  29.3*  PLT  275  277  296    Coag's No results for input(s): APTT, INR in the last 72 hours.  BMET Recent Labs     01/20/15  1022  01/21/15  0420  01/22/15  0358  NA  140  144  144  K  5.3*  4.4  3.2*  CL  92*  91*  89*  CO2  40*  41*  48*  BUN  31*  33*  36*  CREATININE  0.49  0.58  0.65  GLUCOSE  126*  137*  139*    Electrolytes Recent Labs     01/20/15  1022  01/21/15  0420  01/22/15  0358  CALCIUM  9.1  8.8*  8.3*    Sepsis Markers No results for input(s): PROCALCITON, O2SATVEN in the last 72 hours.  Invalid input(s): LACTICACIDVEN  ABG Recent Labs     01/20/15  1011  01/20/15  1453  PHART  7.278*  7.385  PCO2ART  94.3*  74.7*  PO2ART  112*  67.8*    Liver Enzymes Recent Labs     01/20/15  1022    AST  53*  ALT  45  ALKPHOS  99  BILITOT  0.9  ALBUMIN  3.1*    Cardiac Enzymes No results for input(s): TROPONINI, PROBNP in the last 72 hours.  Glucose No results for input(s): GLUCAP in the last 72 hours.  Imaging Dg Abd 1 View  01/20/2015   CLINICAL DATA:  Lower abdominal pain for 1 day.  Initial encounter.  EXAM: ABDOMEN - 1 VIEW  COMPARISON:  Chest in two views abdomen 11/02/2010.  FINDINGS: No free intraperitoneal air is identified. The bowel gas pattern is nonobstructive. There is a massive volume of stool throughout the colon. The patient is status post right hip replacement and fixation of a left intertrochanteric fracture. 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  IMPRESSION: No acute abnormality.  Massive volume of stool throughout the colon.   Electronically Signed   By: Inge Rise M.D.   On: 01/20/2015 14:37   Dg Chest Port 1 View  01/20/2015   CLINICAL DATA:  Shortness of breath.  EXAM: PORTABLE CHEST -  1 VIEW  COMPARISON:  12/29/2014  FINDINGS: The patient has hazy bilateral perihilar infiltrates with slight pulmonary vascular congestion. There are persistent moderate bilateral pleural effusions with compressive atelectasis in the lower lobes as demonstrated on prior chest CT dated 11/12/2014.  Severe arthritic changes of the right shoulder.  IMPRESSION: Findings consistent congestive heart failure with new bilateral pulmonary edema and increased bilateral pleural effusions and lower lobe compressive atelectasis.   Electronically Signed   By: Lorriane Shire M.D.   On: 01/20/2015 10:46  Bilateral pulmonary edema w/ bilateral pleural effusions left > right. Left appears as though it could be partially loculated.   ASSESSMENT / PLAN: 1.  Encephalopathy/Altered Mental Status:  Secondary to hypercarbia & hypoxia. Resolved. 2.  Acute Hypoxic Respiratory Failure:  Continue to wean FiO2 for Sat 88-92%. Continuing Lasix diuresis per primary service.  3.  Acute  Hypercarbic Respiratory Failure:  Resolved. Continuing Atrovent & Xopenex nebs qid. Prednisone 60mg  daily. 4.  Atrial Fibrillation:  Per primary service. Restarted Xarelto per primary service. Monitoring on Tele. 5.  Hypokalemia:  Replacing PO. Repeat Electrolytes in AM. 6.  Anemia:  No signs of active bleeding. Repeat CBC ordered in AM. 7.  Protein-Calorie Malnutrition:  Per primary service. 8.  Hyperkalemia:  Resolved. 9.  Prophylaxis:  SCDs & Protonix PO. On systemic anticoagulation w/ Xarelto. 10.  Diet:  Per primary service.  At this time we are signing off. RN reports patient to be transferred out of ICU/Stepdown. Please contact us if we can be of any further assistance.  Sonia Baller Ashok Cordia, M.D. [facility] Pager:  [phone] After 3pm or if no response, call [phone]  01/22/2015, 8:27 AM

## 2015-01-22 NOTE — Evaluation (Signed)
Physical Therapy Evaluation Patient Details Name: Jennifer Nielsen MRN: [id number] DOB: 08-26-1924 Today's Date: 01/22/2015   History of Present Illness  82 yo female admitted with CHF, AMS, acute resp failure. Hx of O2 dependency, CHF, COPD. Pt is from SNF  Clinical Impression  Bed level eval only. AROM/AAROM UEs and LEs. Pt tolerated well. Recommend OOB<>recliner with hoyer lift as tolerated with nursing(pt is total assist +2 vs hoyer lift for transfers at [facility]). Pt likely on restorative care program at SNF. She states she would like to continue with exercises during hospital stay.     Follow Up Recommendations SNF;Supervision/Assistance - 24 hour    Equipment Recommendations  None recommended by PT    Recommendations for Other Services       Precautions / Restrictions Precautions Precautions: Fall Precaution Comments: O2 dep. non ambulatory Restrictions Weight Bearing Restrictions: No      Mobility  Bed Mobility                  Transfers                    Ambulation/Gait                Stairs            Wheelchair Mobility    Modified Rankin (Stroke Patients Only)       Balance                                             Pertinent Vitals/Pain Pain Assessment: No/denies pain    Home Living Family/patient expects to be discharged to:: Skilled nursing facility                      Prior Function Level of Independence: Needs assistance   Gait / Transfers Assistance Needed: nonambulatory     Comments: patient states  2 person total assist OOB vs hoyer     Hand Dominance        Extremity/Trunk Assessment   Upper Extremity Assessment: Generalized weakness             RLE Deficits / Details: very weak, not deemed strong enough to pivot, feet are somewhat plantarflexed. LLE Deficits / Details: same as R     Communication   Communication: HOH  Cognition Arousal/Alertness: Awake/alert    Overall Cognitive Status: Impaired/Different from baseline Area of Impairment: Orientation;Memory Orientation Level: Place;Time   Memory: Decreased short-term memory              General Comments      Exercises General Exercises - Upper Extremity Shoulder Flexion: Both;10 reps;Supine;AROM Elbow Flexion: AROM;Both;10 reps;Supine Digit Composite Flexion: AROM;Both;10 reps;Supine Composite Extension: AROM;Both;10 reps;Supine General Exercises - Lower Extremity Ankle Circles/Pumps: AROM;Both;Supine;15 reps Heel Slides: AAROM;Both;15 reps;Supine Hip ABduction/ADduction: AAROM;Both;15 reps;Supine      Assessment/Plan    PT Assessment Patient needs continued PT services  PT Diagnosis Generalized weakness;Altered mental status   PT Problem List Decreased strength;Decreased range of motion;Decreased activity tolerance;Decreased mobility;Decreased knowledge of use of DME;Decreased cognition  PT Treatment Interventions Functional mobility training;Therapeutic activities;Therapeutic exercise;Patient/family education;Balance training   PT Goals (Current goals can be found in the Care Plan section) Acute Rehab PT Goals Patient Stated Goal: none stated PT Goal Formulation: With patient Time For Goal Achievement: 02/05/15 Potential to Achieve Goals: Poor  Frequency Min 2X/week   Barriers to discharge        Co-evaluation               End of Session   Activity Tolerance: Patient tolerated treatment well Patient left: in bed;with call bell/phone within reach;with family/visitor present;with bed alarm set           Time: 1444-1455 PT Time Calculation (min) (ACUTE ONLY): 11 min   Charges:   PT Evaluation $Initial PT Evaluation Tier I: 1 Procedure     PT G Codes:        Weston Anna, MPT Pager: [phone]

## 2015-01-23 LAB — CBC WITH DIFFERENTIAL/PLATELET
BASOS PCT: 0 % (ref 0–1)
Basophils Absolute: 0 10*3/uL (ref 0.0–0.1)
EOS ABS: 0 10*3/uL (ref 0.0–0.7)
Eosinophils Relative: 0 % (ref 0–5)
HCT: 34.1 % — ABNORMAL LOW (ref 36.0–46.0)
Hemoglobin: 9.7 g/dL — ABNORMAL LOW (ref 12.0–15.0)
Lymphocytes Relative: 12 % (ref 12–46)
Lymphs Abs: 0.8 10*3/uL (ref 0.7–4.0)
MCH: 27.9 pg (ref 26.0–34.0)
MCHC: 28.4 g/dL — AB (ref 30.0–36.0)
MCV: 98 fL (ref 78.0–100.0)
Monocytes Absolute: 1 10*3/uL (ref 0.1–1.0)
Monocytes Relative: 16 % — ABNORMAL HIGH (ref 3–12)
Neutro Abs: 4.6 10*3/uL (ref 1.7–7.7)
Neutrophils Relative %: 72 % (ref 43–77)
PLATELETS: 337 10*3/uL (ref 150–400)
RBC: 3.48 MIL/uL — ABNORMAL LOW (ref 3.87–5.11)
RDW: 17.7 % — AB (ref 11.5–15.5)
WBC: 6.3 10*3/uL (ref 4.0–10.5)

## 2015-01-23 LAB — BASIC METABOLIC PANEL
ANION GAP: 7 (ref 5–15)
BUN: 39 mg/dL — AB (ref 6–20)
CO2: 44 mmol/L — ABNORMAL HIGH (ref 22–32)
Calcium: 8.6 mg/dL — ABNORMAL LOW (ref 8.9–10.3)
Chloride: 90 mmol/L — ABNORMAL LOW (ref 101–111)
Creatinine, Ser: 0.59 mg/dL (ref 0.44–1.00)
GFR calc Af Amer: >60 mL/min (ref >60)
Glucose, Bld: 128 mg/dL — ABNORMAL HIGH (ref 65–99)
POTASSIUM: 4.7 mmol/L (ref 3.5–5.1)
SODIUM: 141 mmol/L (ref 135–145)

## 2015-01-23 LAB — MAGNESIUM: MAGNESIUM: 2.3 mg/dL (ref 1.7–2.4)

## 2015-01-23 MED ORDER — IPRATROPIUM-ALBUTEROL 0.5-2.5 (3) MG/3ML IN SOLN
3.0000 mL | Freq: Three times a day (TID) | RESPIRATORY_TRACT | Status: DC
Start: 2015-01-23 — End: 2015-01-28
  Administered 2015-01-23 – 2015-01-28 (×15): 3 mL via RESPIRATORY_TRACT
  Filled 2015-01-23 (×15): qty 3

## 2015-01-23 MED ORDER — FOSFOMYCIN TROMETHAMINE 3 G PO PACK
3.0000 g | PACK | Freq: Once | ORAL | Status: AC
  Administered 2015-01-23: 3 g via ORAL
  Filled 2015-01-23: qty 3

## 2015-01-23 NOTE — Progress Notes (Addendum)
Patient Demographics  Jennifer Nielsen, is a 81 y.o. female, DOB - 05/29/1925, XVQ:[id number]  Admit date - 01/20/2015   Admitting Physician Albertine Patricia, MD  Outpatient Primary MD for the patient is DAY,JAMES, MD  LOS - 3   Chief Complaint  Patient presents with  . Shortness of Breath  . Abdominal Pain       Admission HPI/Brief narrative:  81 y.o. female withhistory of chronic respiratory failure on 2 L oxygen , chronic diastolic congestive heart failure, chronic obstructive pulmonary disease, atrial fibrillation on Xarelto,with multiple admissions recently , presents due to acute hypoxic resp failure secondary to COPD and CHF, significant improvement on BiPAP. Initial workup included a chest x-ray which showed findings suggestive of congestive heart failure with new bilateral pulmonary edema and increased bilateral pleural effusions. She was started on 80 mg of Lasix twice daily having subsequent clinical improvement. Subjective:   [name] today has, No headache, No chest pain, No abdominal pain - No Nausea, No Cough - SOB.   Assessment & Plan    Active Problems:   Acute on chronic respiratory failure   Long term current use of anticoagulant therapy   Atrial fibrillation   COPD exacerbation   Hyperkalemia   Acute on chronic diastolic CHF (congestive heart failure)   Congestive heart failure  Acute on chronic hypercarbic respiratory failure - On 2 L nasal cannula at baseline secondary to baseline COPD and chronic diastolic CHF - Presents with PCO2 of 94, patient is DO NOT RESUSCITATE/DO NOT INTUBATE, her symptoms secondary to pulmonary edema, and COPD exacerbation. - on BiPAP for the first 24 hours, with significant improvement of mental status and breathing.back tp daseline. -She was transferred to telemetry where she remained stable, improving after the administration of IV  Lasix  Acute on chronic diastolic CHF -Her last transthoracic echocardiogram was performed on 11/10/2014 showing preserved ejection fraction of 60-65% having pulmonary pressures of 59 mmHg. -Has been diuresing well having a net negative fluid balance of 4.4 L however appears volume overloaded on exam still -Will continue one more day of IV diuresis. Her kidney function remained stable -Continue to monitor daily weights, strict ins and outs.  UTI -Urine culture growing Pseudomonas -Susceptibility testing showing organism is sensitive to ciprofloxacin, unfortunately patient have a Cipro allergy. -She is currently on Fortaz IV -Will stop Tressie Ellis and give 1 dose of Fosfomycin which should cover UTI  Acute COPD exacerbation -No wheezing , continue with steroids tapper.  A. fib with RVR - Patient started on Cardizem drip initially to treat  A. fib with RVR, heart rate controlled ans has been transitioned to her home dose Cardizem. -Continue Xarelto as tolerating oral intake now.  Hypokalemia -Improved with supplementation  Anemia - Monitor closely, check CBC in a.m  Code Status: DO NOT RESUSCITATE  Family Communication: None at bedside  Disposition Plan: SNF when stable   Procedures None   Consults   Pulmonary critical care   Medications  Scheduled Meds: . antiseptic oral rinse  7 mL Mouth Rinse BID  . cefTAZidime (FORTAZ)  IV  1 g Intravenous Q8H  . diltiazem  180 mg Oral Daily  . furosemide  80 mg Intravenous Q12H  . ipratropium-albuterol  3 mL Nebulization  TID  . pantoprazole  40 mg Oral Daily  . predniSONE  40 mg Oral Q breakfast  . rivaroxaban  20 mg Oral Daily   Continuous Infusions: . diltiazem (CARDIZEM) infusion Stopped (01/21/15 1200)   PRN Meds:.acetaminophen **OR** acetaminophen, albuterol, bisacodyl, ondansetron **OR** ondansetron (ZOFRAN) IV, polyvinyl alcohol  DVT Prophylaxis  Xarelto  Lab Results  Component Value Date   PLT 337 01/23/2015     Antibiotics   Anti-infectives    Start     Dose/Rate Route Frequency Ordered Stop   01/22/15 1400  cefTAZidime (FORTAZ) 1 g in dextrose 5 % 50 mL IVPB     1 g 100 mL/hr over 30 Minutes Intravenous Every 8 hours 01/22/15 1303            Objective:   Filed Vitals:   01/23/15 0642 01/23/15 0740 01/23/15 1300 01/23/15 1420  BP: 121/89     Pulse: 111     Temp: 98.2 F (36.8 C)     TempSrc: Oral     Resp: 20     Height:      Weight:   96.6 kg (212 lb 15.4 oz)   SpO2: 93% 93%  95%    Wt Readings from Last 3 Encounters:  01/23/15 96.6 kg (212 lb 15.4 oz)  01/02/15 91.8 kg (202 lb 6.1 oz)  12/23/14 92.08 kg (203 lb)     Intake/Output Summary (Last 24 hours) at 01/23/15 1426 Last data filed at 01/23/15 1258  Gross per 24 hour  Intake      0 ml  Output   1850 ml  Net  -1850 ml     Physical Exam  Awake, alert, pleasant [place]AT, ++ JVD, No cervical lymphadenopathy appriciated.  Symmetrical Chest wall movement, good air movement bilaterally, no wheezing irregular,No Gallops,Rubs or new Murmurs, No Parasternal Heave +ve B.Sounds, Abd Soft, No tenderness, No organomegaly appriciated, No Cyanosis, Clubbing +2 edema   Data Review   Micro Results Recent Results (from the past 240 hour(s))  Urine culture     Status: None   Collection Time: 01/20/15 11:43 AM  Result Value Ref Range Status   Specimen Description URINE, CATHETERIZED  Final   Special Requests Normal  Final   Culture   Final    >=100,000 COLONIES/mL PSEUDOMONAS AERUGINOSA Performed at [facility]    Report Status 01/22/2015 FINAL  Final   Organism ID, Bacteria PSEUDOMONAS AERUGINOSA  Final      Susceptibility   Pseudomonas aeruginosa - MIC*    CEFTAZIDIME 4 SENSITIVE Sensitive     CIPROFLOXACIN <=0.25 SENSITIVE Sensitive     GENTAMICIN 4 SENSITIVE Sensitive     IMIPENEM 1 SENSITIVE Sensitive     PIP/TAZO <=4 SENSITIVE Sensitive     CEFEPIME 2 SENSITIVE Sensitive     * >=100,000  COLONIES/mL PSEUDOMONAS AERUGINOSA    Radiology Reports Dg Abd 1 View  01/20/2015   CLINICAL DATA:  Lower abdominal pain for 1 day.  Initial encounter.  EXAM: ABDOMEN - 1 VIEW  COMPARISON:  Chest in two views abdomen 11/02/2010.  FINDINGS: No free intraperitoneal air is identified. The bowel gas pattern is nonobstructive. There is a massive volume of stool throughout the colon. The patient is status post right hip replacement and fixation of a left intertrochanteric fracture. 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  IMPRESSION: No acute abnormality.  Massive volume of stool throughout the colon.   Electronically Signed   By: Inge Rise M.D.   On: 01/20/2015  14:37   Dg Chest Port 1 View  01/20/2015   CLINICAL DATA:  Shortness of breath.  EXAM: PORTABLE CHEST - 1 VIEW  COMPARISON:  12/29/2014  FINDINGS: The patient has hazy bilateral perihilar infiltrates with slight pulmonary vascular congestion. There are persistent moderate bilateral pleural effusions with compressive atelectasis in the lower lobes as demonstrated on prior chest CT dated 11/12/2014.  Severe arthritic changes of the right shoulder.  IMPRESSION: Findings consistent congestive heart failure with new bilateral pulmonary edema and increased bilateral pleural effusions and lower lobe compressive atelectasis.   Electronically Signed   By: Lorriane Shire M.D.   On: 01/20/2015 10:46   Dg Chest Port 1 View  12/29/2014   CLINICAL DATA:  81 year old female with pneumonia and history of atrial fibrillation  EXAM: PORTABLE CHEST - 1 VIEW  COMPARISON:  Prior chest x-ray 12/28/2014  FINDINGS: Stable cardiomegaly. Atherosclerotic calcifications again noted in the transverse aorta. Pulmonary vascular congestion with perhaps mild interstitial edema. Findings are slightly progressed compared to prior. Bilateral left larger than right layering pleural effusions and associated bibasilar atelectasis. Infiltrate difficult to exclude in the left  base. No acute osseous abnormality.  IMPRESSION: 1. Similar to slightly increased interstitial pulmonary edema. 2. Otherwise unchanged cardiomegaly and left larger than right layering effusions with associated bibasilar atelectasis.   Electronically Signed   By: Jacqulynn Cadet M.D.   On: 12/29/2014 08:20   Dg Chest Port 1 View  12/28/2014   CLINICAL DATA:  Respiratory distress.  EXAM: PORTABLE CHEST - 1 VIEW  COMPARISON:  11/14/2014  FINDINGS: There is cardiac enlargement. Bilateral pleural effusions are identified left greater than right. There is mild diffuse interstitial edema. Atelectasis is identified overlying the effusions.  IMPRESSION: 1. Congestive heart failure.   Electronically Signed   By: Kerby Moors M.D.   On: 12/28/2014 18:26     CBC  Recent Labs Lab 01/20/15 1405 01/21/15 0420 01/22/15 0358 01/23/15 0511  WBC 8.2 4.5 3.9* 6.3  HGB 10.2* 10.2* 8.7* 9.7*  HCT 34.8* 33.9* 29.3* 34.1*  PLT 275 277 296 337  MCV 100.0 97.1 98.3 98.0  MCH 29.3 29.2 29.2 27.9  MCHC 29.3* 30.1 29.7* 28.4*  RDW 17.8* 17.6* 17.8* 17.7*  LYMPHSABS 0.4*  --   --  0.8  MONOABS 0.1  --   --  1.0  EOSABS 0.0  --   --  0.0  BASOSABS 0.0  --   --  0.0    Chemistries   Recent Labs Lab 01/20/15 1022 01/21/15 0420 01/22/15 0358 01/23/15 0511  NA 140 144 144 141  K 5.3* 4.4 3.2* 4.7  CL 92* 91* 89* 90*  CO2 40* 41* 48* 44*  GLUCOSE 126* 137* 139* 128*  BUN 31* 33* 36* 39*  CREATININE 0.49 0.58 0.65 0.59  CALCIUM 9.1 8.8* 8.3* 8.6*  MG  --   --   --  2.3  AST 53*  --   --   --   ALT 45  --   --   --   ALKPHOS 99  --   --   --   BILITOT 0.9  --   --   --    ------------------------------------------------------------------------------------------------------------------ estimated creatinine clearance is 54.9 mL/min (by C-G formula based on Cr of 0.59). ------------------------------------------------------------------------------------------------------------------ No results for  input(s): HGBA1C in the last 72 hours. ------------------------------------------------------------------------------------------------------------------ No results for input(s): CHOL, HDL, LDLCALC, TRIG, CHOLHDL, LDLDIRECT in the last 72 hours. ------------------------------------------------------------------------------------------------------------------ No results for input(s): TSH, T4TOTAL, T3FREE,  THYROIDAB in the last 72 hours.  Invalid input(s): FREET3 ------------------------------------------------------------------------------------------------------------------ No results for input(s): VITAMINB12, FOLATE, FERRITIN, TIBC, IRON, RETICCTPCT in the last 72 hours.  Coagulation profile No results for input(s): INR, PROTIME in the last 168 hours.  No results for input(s): DDIMER in the last 72 hours.  Cardiac Enzymes No results for input(s): CKMB, TROPONINI, MYOGLOBIN in the last 168 hours.  Invalid input(s): CK ------------------------------------------------------------------------------------------------------------------ Invalid input(s): POCBNP     Time Spent in minutes   30 minutes   Kelvin Cellar M.D on 01/23/2015 at 2:26 PM  Between 7am to 7pm - Pager - [phone]  After 7pm go to [email or web address] - password [email or web address]  [facility]   Office  [phone]

## 2015-01-24 DIAGNOSIS — I4891 Unspecified atrial fibrillation: Secondary | ICD-10-CM

## 2015-01-24 DIAGNOSIS — J9621 Acute and chronic respiratory failure with hypoxia: Secondary | ICD-10-CM

## 2015-01-24 DIAGNOSIS — I5033 Acute on chronic diastolic (congestive) heart failure: Principal | ICD-10-CM

## 2015-01-24 LAB — CBC
HCT: 32.9 % — ABNORMAL LOW (ref 36.0–46.0)
HEMOGLOBIN: 9.2 g/dL — AB (ref 12.0–15.0)
MCH: 27.7 pg (ref 26.0–34.0)
MCHC: 28 g/dL — ABNORMAL LOW (ref 30.0–36.0)
MCV: 99.1 fL (ref 78.0–100.0)
Platelets: 281 10*3/uL (ref 150–400)
RBC: 3.32 MIL/uL — AB (ref 3.87–5.11)
RDW: 17.4 % — ABNORMAL HIGH (ref 11.5–15.5)
WBC: 5.6 10*3/uL (ref 4.0–10.5)

## 2015-01-24 LAB — BASIC METABOLIC PANEL
BUN: 28 mg/dL — ABNORMAL HIGH (ref 6–20)
CALCIUM: 8.3 mg/dL — AB (ref 8.9–10.3)
CO2: >50 mmol/L — ABNORMAL HIGH (ref 22–32)
Chloride: 87 mmol/L — ABNORMAL LOW (ref 101–111)
Creatinine, Ser: 0.49 mg/dL (ref 0.44–1.00)
GFR calc Af Amer: >60 mL/min (ref >60)
GLUCOSE: 115 mg/dL — AB (ref 65–99)
Potassium: 3.5 mmol/L (ref 3.5–5.1)
Sodium: 144 mmol/L (ref 135–145)

## 2015-01-24 MED ORDER — POTASSIUM CHLORIDE CRYS ER 20 MEQ PO TBCR
20.0000 meq | EXTENDED_RELEASE_TABLET | Freq: Two times a day (BID) | ORAL | Status: DC
  Administered 2015-01-24: 20 meq via ORAL
  Filled 2015-01-24: qty 1

## 2015-01-24 NOTE — Progress Notes (Signed)
Progress Note   Jennifer Nielsen OZD:[id number] DOB: March 23, 1925 DOA: 01/20/2015 PCP: [name], MD   Brief Narrative:   Jennifer Nielsen is an 81 y.o. female with a history of chronic respiratory failure on 2 L oxygen, chronic diastolic congestive heart failure, chronic obstructive pulmonary disease, atrial fibrillation on Xarelto,with multiple admissions recently, who was admitted 01/20/15 with acute hypoxic resp failure secondary to COPD and CHF, significant improvement on BiPAP. Initial workup included a chest x-ray which showed findings suggestive of congestive heart failure with new bilateral pulmonary edema and increased bilateral pleural effusions. She was started on 80 mg of Lasix twice daily having subsequent clinical improvement.  Assessment/Plan:   Principal problem:  Acute on chronic hypercarbic respiratory failure, multifactorial - On 2 L nasal cannula at baseline secondary to baseline COPD and chronic diastolic CHF. - Presented with PCO2 of 94, patient is a DNR, her symptoms secondary to pulmonary edema, and COPD exacerbation. - On BiPAP for the first 24 hours, with significant improvement of mental status and breathing back to baseline. - She was transferred to telemetry where she remained stable, improving after the administration of IV Lasix  Acute on chronic diastolic CHF -Last 2-D echo 11/10/2014 showed preserved EF of 60-65% and pulmonary pressures of 59 mmHg. -Has been diuresing well with negative I/O balance of 1.8/24 hours. -Renal function remains stable with diuresis. -Continue to monitor daily weights, strict ins and outs.  Pseudomonal UTI -Urine culture growing Pseudomonas. -Susceptibility testing showing organism is sensitive to ciprofloxacin, unfortunately patient have a Cipro allergy. -She is currently on Fortaz IV. -Will stop Tressie Ellis and give 1 dose of Fosfomycin which should cover UTI  Acute COPD exacerbation -No wheezing , continue with steroids taper.  A.  fib with RVR - IV Cardizem weaned, now on home oral dose of Cardizem with rate control (heart rate 70s-80s). -Continue Xarelto as tolerating oral intake now.  Hypokalemia -We'll place on routine supplementation while on Lasix.  Anemia - Hemoglobin stable.    DVT Prophylaxis - On Xarelto.  Family Communication: [name], daughter at bedside. Disposition Plan: From [facility]. Can return there when fully diurese, likely another 1-2 days. Code Status:     Code Status Orders        Start     Ordered   01/20/15 1545  Do not attempt resuscitation (DNR)   Continuous    Question Answer Comment  In the event of cardiac or respiratory ARREST Do not call a "code blue"   In the event of cardiac or respiratory ARREST Do not perform Intubation, CPR, defibrillation or ACLS   In the event of cardiac or respiratory ARREST Use medication by any route, position, wound care, and other measures to relive pain and suffering. May use oxygen, suction and manual treatment of airway obstruction as needed for comfort.      01/20/15 1544    Advance Directive Documentation        Most Recent Value   Type of Advance Directive  Out of facility DNR (pink MOST or yellow form)   Pre-existing out of facility DNR order (yellow form or pink MOST form)     "MOST" Form in Place?          IV Access:    Peripheral IV   Procedures and diagnostic studies:   Dg Abd 1 View  01/20/2015   CLINICAL DATA:  Lower abdominal pain for 1 day.  Initial encounter.  EXAM: ABDOMEN - 1 VIEW  COMPARISON:  Chest  in two views abdomen 11/02/2010.  FINDINGS: No free intraperitoneal air is identified. The bowel gas pattern is nonobstructive. There is a massive volume of stool throughout the colon. The patient is status post right hip replacement and fixation of a left intertrochanteric fracture. 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  IMPRESSION: No acute abnormality.  Massive volume of stool throughout the colon.    Electronically Signed   By: Inge Rise M.D.   On: 01/20/2015 14:37   Dg Chest Port 1 View  01/20/2015   CLINICAL DATA:  Shortness of breath.  EXAM: PORTABLE CHEST - 1 VIEW  COMPARISON:  12/29/2014  FINDINGS: The patient has hazy bilateral perihilar infiltrates with slight pulmonary vascular congestion. There are persistent moderate bilateral pleural effusions with compressive atelectasis in the lower lobes as demonstrated on prior chest CT dated 11/12/2014.  Severe arthritic changes of the right shoulder.  IMPRESSION: Findings consistent congestive heart failure with new bilateral pulmonary edema and increased bilateral pleural effusions and lower lobe compressive atelectasis.   Electronically Signed   By: Lorriane Shire M.D.   On: 01/20/2015 10:46   Dg Chest Port 1 View  12/29/2014   CLINICAL DATA:  81 year old female with pneumonia and history of atrial fibrillation  EXAM: PORTABLE CHEST - 1 VIEW  COMPARISON:  Prior chest x-ray 12/28/2014  FINDINGS: Stable cardiomegaly. Atherosclerotic calcifications again noted in the transverse aorta. Pulmonary vascular congestion with perhaps mild interstitial edema. Findings are slightly progressed compared to prior. Bilateral left larger than right layering pleural effusions and associated bibasilar atelectasis. Infiltrate difficult to exclude in the left base. No acute osseous abnormality.  IMPRESSION: 1. Similar to slightly increased interstitial pulmonary edema. 2. Otherwise unchanged cardiomegaly and left larger than right layering effusions with associated bibasilar atelectasis.   Electronically Signed   By: Jacqulynn Cadet M.D.   On: 12/29/2014 08:20   Dg Chest Port 1 View  12/28/2014   CLINICAL DATA:  Respiratory distress.  EXAM: PORTABLE CHEST - 1 VIEW  COMPARISON:  11/14/2014  FINDINGS: There is cardiac enlargement. Bilateral pleural effusions are identified left greater than right. There is mild diffuse interstitial edema. Atelectasis is identified  overlying the effusions.  IMPRESSION: 1. Congestive heart failure.   Electronically Signed   By: Kerby Moors M.D.   On: 12/28/2014 18:26     Medical Consultants:    None.  Anti-Infectives:    None.  Subjective:   Jennifer Nielsen denies current dyspnea but has occasional wheezing.  Reports that the swelling in her extremities has improved, but still has some swelling. Appetite okay.  Objective:    Filed Vitals:   01/23/15 1944 01/23/15 2042 01/24/15 0452 01/24/15 0805  BP:  127/84 128/51   Pulse:  89 79   Temp:  97.9 F (36.6 C) 97.6 F (36.4 C)   TempSrc:  Oral Oral   Resp:  20 20   Height:      Weight:   92.851 kg (204 lb 11.2 oz)   SpO2: 94% 95% 93% 87%    Intake/Output Summary (Last 24 hours) at 01/24/15 0844 Last data filed at 01/24/15 0515  Gross per 24 hour  Intake    320 ml  Output   2500 ml  Net  -2180 ml    Exam: Gen:  NAD Cardiovascular:  HSIR, No M/R/G Respiratory:  Lungs CTAB Gastrointestinal:  Abdomen soft, NT/ND, + BS Extremities:  + upper and lower extremity edema  Data Reviewed:    Labs: Basic Metabolic Panel:  Recent Labs Lab 01/20/15 1022 01/21/15 0420 01/22/15 0358 01/23/15 0511 01/24/15 0405  NA 140 144 144 141 144  K 5.3* 4.4 3.2* 4.7 3.5  CL 92* 91* 89* 90* 87*  CO2 40* 41* 48* 44* >50*  GLUCOSE 126* 137* 139* 128* 115*  BUN 31* 33* 36* 39* 28*  CREATININE 0.49 0.58 0.65 0.59 0.49  CALCIUM 9.1 8.8* 8.3* 8.6* 8.3*  MG  --   --   --  2.3  --    GFR Estimated Creatinine Clearance: 53.7 mL/min (by C-G formula based on Cr of 0.49). Liver Function Tests:  Recent Labs Lab 01/20/15 1022  AST 53*  ALT 45  ALKPHOS 99  BILITOT 0.9  PROT 6.9  ALBUMIN 3.1*    Recent Labs Lab 01/20/15 1022  LIPASE 15*   CBC:  Recent Labs Lab 01/20/15 1405 01/21/15 0420 01/22/15 0358 01/23/15 0511 01/24/15 0405  WBC 8.2 4.5 3.9* 6.3 5.6  NEUTROABS 7.7  --   --  4.6  --   HGB 10.2* 10.2* 8.7* 9.7* 9.2*  HCT 34.8* 33.9*  29.3* 34.1* 32.9*  MCV 100.0 97.1 98.3 98.0 99.1  PLT 275 277 296 337 281   BNP (last 3 results)  Recent Labs  02/22/14 1417 06/17/14 1050  PROBNP 2244.0* 170.0*   Microbiology Recent Results (from the past 240 hour(s))  Urine culture     Status: None   Collection Time: 01/20/15 11:43 AM  Result Value Ref Range Status   Specimen Description URINE, CATHETERIZED  Final   Special Requests Normal  Final   Culture   Final    >=100,000 COLONIES/mL PSEUDOMONAS AERUGINOSA Performed at [facility]    Report Status 01/22/2015 FINAL  Final   Organism ID, Bacteria PSEUDOMONAS AERUGINOSA  Final      Susceptibility   Pseudomonas aeruginosa - MIC*    CEFTAZIDIME 4 SENSITIVE Sensitive     CIPROFLOXACIN <=0.25 SENSITIVE Sensitive     GENTAMICIN 4 SENSITIVE Sensitive     IMIPENEM 1 SENSITIVE Sensitive     PIP/TAZO <=4 SENSITIVE Sensitive     CEFEPIME 2 SENSITIVE Sensitive     * >=100,000 COLONIES/mL PSEUDOMONAS AERUGINOSA     Medications:   . antiseptic oral rinse  7 mL Mouth Rinse BID  . diltiazem  180 mg Oral Daily  . furosemide  80 mg Intravenous Q12H  . ipratropium-albuterol  3 mL Nebulization TID  . pantoprazole  40 mg Oral Daily  . predniSONE  40 mg Oral Q breakfast  . rivaroxaban  20 mg Oral Daily   Continuous Infusions:   Time spent: 25 minutes.   LOS: 4 days   [facility] Pager [phone]. If unable to reach me by pager, please call my cell phone at [phone].  *Please refer to [email or web address], password [email or web address] to get updated schedule on who will round on this patient, as hospitalists switch teams weekly. If 7PM-7AM, please contact night-coverage at [email or web address], password [email or web address] for any overnight needs.  01/24/2015, 8:44 AM

## 2015-01-24 NOTE — Progress Notes (Signed)
CSW continuing to follow.   Pt admitted from [facility] where pt is a long term resident and plans to return upon discharge.  CSW met with pt and pt daughter at bedside to provide support.   Per RN, pt not yet medically ready for discharge today.   CSW contacted [facility] and updated facility. [place] confirmed that they can accept pt back over the weekend if pt medically stable.   CSW to continue to follow to provide support and assist with pt return to [facility] when medically stable for discharge.  Alison Murray, MSW, [place] [facility] [phone]

## 2015-01-25 DIAGNOSIS — J441 Chronic obstructive pulmonary disease with (acute) exacerbation: Secondary | ICD-10-CM

## 2015-01-25 DIAGNOSIS — Z7901 Long term (current) use of anticoagulants: Secondary | ICD-10-CM

## 2015-01-25 LAB — BASIC METABOLIC PANEL
BUN: 22 mg/dL — AB (ref 6–20)
CHLORIDE: 84 mmol/L — AB (ref 101–111)
CO2: >50 mmol/L — ABNORMAL HIGH (ref 22–32)
CREATININE: 0.37 mg/dL — AB (ref 0.44–1.00)
Calcium: 8.3 mg/dL — ABNORMAL LOW (ref 8.9–10.3)
GFR calc Af Amer: >60 mL/min (ref >60)
GFR calc non Af Amer: >60 mL/min (ref >60)
Glucose, Bld: 118 mg/dL — ABNORMAL HIGH (ref 65–99)
POTASSIUM: 3.4 mmol/L — AB (ref 3.5–5.1)
SODIUM: 145 mmol/L (ref 135–145)

## 2015-01-25 LAB — BRAIN NATRIURETIC PEPTIDE: B Natriuretic Peptide: 392.6 pg/mL — ABNORMAL HIGH (ref 0.0–100.0)

## 2015-01-25 MED ORDER — POTASSIUM CHLORIDE CRYS ER 20 MEQ PO TBCR
30.0000 meq | EXTENDED_RELEASE_TABLET | Freq: Two times a day (BID) | ORAL | Status: DC
  Administered 2015-01-25 – 2015-01-27 (×6): 30 meq via ORAL
  Filled 2015-01-25 (×12): qty 1

## 2015-01-25 NOTE — Progress Notes (Addendum)
Progress Note   Jennifer Nielsen AYT:[id number] DOB: [birth year] DOA: 01/20/2015 PCP: [name], MD   Brief Narrative:   Jennifer Nielsen is an 81 y.o. female with a history of chronic respiratory failure on 2 L oxygen, chronic diastolic congestive heart failure, chronic obstructive pulmonary disease, atrial fibrillation on Xarelto,with multiple admissions recently, who was admitted 01/20/15 with acute hypoxic resp failure secondary to COPD and CHF, significant improvement on BiPAP. Initial workup included a chest x-ray which showed findings suggestive of congestive heart failure with new bilateral pulmonary edema and increased bilateral pleural effusions. She was started on 80 mg of Lasix twice daily having subsequent clinical improvement.  Assessment/Plan:   Principal problem:  Acute on chronic hypercarbic respiratory failure, multifactorial - On 2 L nasal cannula at baseline secondary to baseline COPD and chronic diastolic CHF. - Presented with PCO2 of 94, patient is a DNR, her symptoms secondary to pulmonary edema, and COPD exacerbation. - On BiPAP for the first 24 hours, with significant improvement of mental status and breathing back to baseline. - She was transferred to telemetry where she remained stable, improving after the administration of IV Lasix.  Acute on chronic diastolic CHF -Last 2-D echo 11/10/2014 showed preserved EF of 60-65% and pulmonary pressures of 59 mmHg. -Has been diuresing well with negative I/O balance of 1.77/24 hours. -Renal function remains stable with diuresis. Check BNP. -Continue to monitor daily weights, strict ins and outs.  Pseudomonal UTI -Urine culture growing Pseudomonas. -Susceptibility testing showing organism is sensitive to ciprofloxacin, unfortunately patient have a Cipro allergy. -S/P Fortaz and 1 dose of Fosfomycin which should cover UTI.  Acute COPD exacerbation -No wheezing , continue with steroids taper.  A. fib with RVR - IV Cardizem  weaned, now on home oral dose of Cardizem with rate control (heart rate 70s-80s). -Continue Xarelto as tolerating oral intake now.  Hypokalemia -Increase potassium supplementation dose to 30 mEq while on Lasix.  Anemia - Hemoglobin stable.    DVT Prophylaxis - On Xarelto.  Family Communication: [name], daughter at bedside 01/24/15, no family present today. Disposition Plan: From [facility] Can return there when fully diurese, likely another 1-2 days. Code Status:     Code Status Orders        Start     Ordered   01/20/15 1545  Do not attempt resuscitation (DNR)   Continuous    Question Answer Comment  In the event of cardiac or respiratory ARREST Do not call a "code blue"   In the event of cardiac or respiratory ARREST Do not perform Intubation, CPR, defibrillation or ACLS   In the event of cardiac or respiratory ARREST Use medication by any route, position, wound care, and other measures to relive pain and suffering. May use oxygen, suction and manual treatment of airway obstruction as needed for comfort.      01/20/15 1544    Advance Directive Documentation        Most Recent Value   Type of Advance Directive  Out of facility DNR (pink MOST or yellow form)   Pre-existing out of facility DNR order (yellow form or pink MOST form)     "MOST" Form in Place?          IV Access:    Peripheral IV   Procedures and diagnostic studies:   Dg Abd 1 View  01/20/2015   CLINICAL DATA:  Lower abdominal pain for 1 day.  Initial encounter.  EXAM: ABDOMEN - 1 VIEW  COMPARISON:  Chest in two views abdomen 11/02/2010.  FINDINGS: No free intraperitoneal air is identified. The bowel gas pattern is nonobstructive. There is a massive volume of stool throughout the colon. The patient is status post right hip replacement and fixation of a left intertrochanteric fracture. 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  IMPRESSION: No acute abnormality.  Massive volume of stool  throughout the colon.   Electronically Signed   By: Inge Rise M.D.   On: 01/20/2015 14:37   Dg Chest Port 1 View  01/20/2015   CLINICAL DATA:  Shortness of breath.  EXAM: PORTABLE CHEST - 1 VIEW  COMPARISON:  12/29/2014  FINDINGS: The patient has hazy bilateral perihilar infiltrates with slight pulmonary vascular congestion. There are persistent moderate bilateral pleural effusions with compressive atelectasis in the lower lobes as demonstrated on prior chest CT dated 11/12/2014.  Severe arthritic changes of the right shoulder.  IMPRESSION: Findings consistent congestive heart failure with new bilateral pulmonary edema and increased bilateral pleural effusions and lower lobe compressive atelectasis.   Electronically Signed   By: Lorriane Shire M.D.   On: 01/20/2015 10:46   Dg Chest Port 1 View  12/29/2014   CLINICAL DATA:  81 year old female with pneumonia and history of atrial fibrillation  EXAM: PORTABLE CHEST - 1 VIEW  COMPARISON:  Prior chest x-ray 12/28/2014  FINDINGS: Stable cardiomegaly. Atherosclerotic calcifications again noted in the transverse aorta. Pulmonary vascular congestion with perhaps mild interstitial edema. Findings are slightly progressed compared to prior. Bilateral left larger than right layering pleural effusions and associated bibasilar atelectasis. Infiltrate difficult to exclude in the left base. No acute osseous abnormality.  IMPRESSION: 1. Similar to slightly increased interstitial pulmonary edema. 2. Otherwise unchanged cardiomegaly and left larger than right layering effusions with associated bibasilar atelectasis.   Electronically Signed   By: Jacqulynn Cadet M.D.   On: 12/29/2014 08:20   Dg Chest Port 1 View  12/28/2014   CLINICAL DATA:  Respiratory distress.  EXAM: PORTABLE CHEST - 1 VIEW  COMPARISON:  11/14/2014  FINDINGS: There is cardiac enlargement. Bilateral pleural effusions are identified left greater than right. There is mild diffuse interstitial edema.  Atelectasis is identified overlying the effusions.  IMPRESSION: 1. Congestive heart failure.   Electronically Signed   By: Kerby Moors M.D.   On: 12/28/2014 18:26     Medical Consultants:    None.  Anti-Infectives:    None.  Subjective:   Jennifer Nielsen denies current dyspnea.  Reports that the swelling in her extremities has improved. Appetite improving per nursing staff.   Objective:    Filed Vitals:   01/24/15 2103 01/24/15 2141 01/25/15 0451 01/25/15 0746  BP: 149/74  132/57   Pulse: 111  89   Temp: 97.6 F (36.4 C)  97.3 F (36.3 C)   TempSrc: Oral  Oral   Resp: 20  18   Height:      Weight:   89.54 kg (197 lb 6.4 oz)   SpO2: 96% 97% 90% 91%    Intake/Output Summary (Last 24 hours) at 01/25/15 0753 Last data filed at 01/25/15 0451  Gross per 24 hour  Intake    480 ml  Output   2250 ml  Net  -1770 ml    Exam: Gen:  NAD, pleasantly confused but oriented to place, month Cardiovascular:  HSIR, No M/R/G Respiratory:  Lungs CTAB Gastrointestinal:  Abdomen soft, NT/ND, + BS Extremities:  + upper and lower extremity edema  Data Reviewed:    Labs:  Basic Metabolic Panel:  Recent Labs Lab 01/21/15 0420 01/22/15 0358 01/23/15 0511 01/24/15 0405 01/25/15 0420  NA 144 144 141 144 145  K 4.4 3.2* 4.7 3.5 3.4*  CL 91* 89* 90* 87* 84*  CO2 41* 48* 44* >50* >50*  GLUCOSE 137* 139* 128* 115* 118*  BUN 33* 36* 39* 28* 22*  CREATININE 0.58 0.65 0.59 0.49 0.37*  CALCIUM 8.8* 8.3* 8.6* 8.3* 8.3*  MG  --   --  2.3  --   --    GFR Estimated Creatinine Clearance: 52.6 mL/min (by C-G formula based on Cr of 0.37). Liver Function Tests:  Recent Labs Lab 01/20/15 1022  AST 53*  ALT 45  ALKPHOS 99  BILITOT 0.9  PROT 6.9  ALBUMIN 3.1*    Recent Labs Lab 01/20/15 1022  LIPASE 15*   CBC:  Recent Labs Lab 01/20/15 1405 01/21/15 0420 01/22/15 0358 01/23/15 0511 01/24/15 0405  WBC 8.2 4.5 3.9* 6.3 5.6  NEUTROABS 7.7  --   --  4.6  --   HGB  10.2* 10.2* 8.7* 9.7* 9.2*  HCT 34.8* 33.9* 29.3* 34.1* 32.9*  MCV 100.0 97.1 98.3 98.0 99.1  PLT 275 277 296 337 281   BNP (last 3 results)  Recent Labs  02/22/14 1417 06/17/14 1050  PROBNP 2244.0* 170.0*   Microbiology Recent Results (from the past 240 hour(s))  Urine culture     Status: None   Collection Time: 01/20/15 11:43 AM  Result Value Ref Range Status   Specimen Description URINE, CATHETERIZED  Final   Special Requests Normal  Final   Culture   Final    >=100,000 COLONIES/mL PSEUDOMONAS AERUGINOSA Performed at [facility]    Report Status 01/22/2015 FINAL  Final   Organism ID, Bacteria PSEUDOMONAS AERUGINOSA  Final      Susceptibility   Pseudomonas aeruginosa - MIC*    CEFTAZIDIME 4 SENSITIVE Sensitive     CIPROFLOXACIN <=0.25 SENSITIVE Sensitive     GENTAMICIN 4 SENSITIVE Sensitive     IMIPENEM 1 SENSITIVE Sensitive     PIP/TAZO <=4 SENSITIVE Sensitive     CEFEPIME 2 SENSITIVE Sensitive     * >=100,000 COLONIES/mL PSEUDOMONAS AERUGINOSA     Medications:   . antiseptic oral rinse  7 mL Mouth Rinse BID  . diltiazem  180 mg Oral Daily  . furosemide  80 mg Intravenous Q12H  . ipratropium-albuterol  3 mL Nebulization TID  . pantoprazole  40 mg Oral Daily  . potassium chloride  20 mEq Oral BID  . predniSONE  40 mg Oral Q breakfast  . rivaroxaban  20 mg Oral Daily   Continuous Infusions:   Time spent: 25 minutes.   LOS: 5 days   [place] [facility] Pager [phone]. If unable to reach me by pager, please call my cell phone at [phone].  *Please refer to [email or web address], password [email or web address] to get updated schedule on who will round on this patient, as hospitalists switch teams weekly. If 7PM-7AM, please contact night-coverage at [email or web address], password [email or web address] for any overnight needs.  01/25/2015, 7:53 AM

## 2015-01-26 DIAGNOSIS — E876 Hypokalemia: Secondary | ICD-10-CM | POA: Diagnosis present

## 2015-01-26 MED ORDER — FUROSEMIDE 40 MG PO TABS
80.0000 mg | ORAL_TABLET | Freq: Two times a day (BID) | ORAL | Status: DC
  Administered 2015-01-26 – 2015-01-28 (×4): 80 mg via ORAL
  Filled 2015-01-26 (×4): qty 2

## 2015-01-26 MED ORDER — METOLAZONE 2.5 MG PO TABS
2.5000 mg | ORAL_TABLET | Freq: Two times a day (BID) | ORAL | Status: DC
  Administered 2015-01-26 – 2015-01-28 (×5): 2.5 mg via ORAL
  Filled 2015-01-26 (×6): qty 1

## 2015-01-26 MED ORDER — PREDNISONE 20 MG PO TABS: 20.0000 mg | ORAL_TABLET | Freq: Every day | ORAL | Status: DC

## 2015-01-26 NOTE — Progress Notes (Signed)
Resting in bed.dtr's at bedside

## 2015-01-26 NOTE — Progress Notes (Addendum)
Progress Note   Jennifer Nielsen ALP:[id number] DOB: September 18, 1924 DOA: 01/20/2015 PCP: [name], MD   Brief Narrative:   Jennifer Nielsen is an 81 y.o. female with a history of chronic respiratory failure on 2 L oxygen, chronic diastolic congestive heart failure, chronic obstructive pulmonary disease, atrial fibrillation on Xarelto,with multiple admissions recently, who was admitted 01/20/15 with acute hypoxic resp failure secondary to COPD and CHF, significant improvement on BiPAP. Initial workup included a chest x-ray which showed findings suggestive of congestive heart failure with new bilateral pulmonary edema and increased bilateral pleural effusions. She was started on 80 mg of Lasix twice daily having subsequent clinical improvement.  Assessment/Plan:   Principal problem:  Acute on chronic hypercarbic respiratory failure, multifactorial - On 2 L nasal cannula at baseline secondary to baseline COPD and chronic diastolic CHF. - Presented with PCO2 of 94, patient is a DNR, her symptoms secondary to pulmonary edema, and COPD exacerbation. - On BiPAP for the first 24 hours, with significant improvement of mental status and breathing back to baseline. - Respiratory status stable with diuresis.  Acute on chronic diastolic CHF -Last 2-D echo 11/10/2014 showed preserved EF of 60-65% and pulmonary pressures of 59 mmHg. -Has been diuresing well with negative I/O balance of 1.1/24 hours. -Renal function remains stable with diuresis. BNP 392.6. -Continue to monitor daily weights, strict ins and outs. - Pulled out IV, will transition to oral Lasix 80 mg BID with Zaroxolyn 2.5 mg BID.   Pseudomonal UTI -Urine culture positive for Pseudomonas. -S/P Fortaz and 1 dose of Fosfomycin.  Acute COPD exacerbation -No wheezing, continue with steroid taper (reduced to 20 mg prednisone daily 01/26/15). Stop steroids after today's dose.  A. fib with RVR - IV Cardizem weaned, now on home oral dose of Cardizem  with rate control (heart rate 70s-80s). -Continue Xarelto as tolerating oral intake now. - D/C tele.  Hypokalemia - Increase potassium supplementation dose to 30 mEq while on Lasix. - Monitor.  Anemia - Hemoglobin stable.    DVT Prophylaxis - On Xarelto.  Family Communication: Jennifer Nielsen, daughter at bedside 01/24/15, no family present today. Disposition Plan: From [facility]. Can return there when fully diuresed, possibly 01/27/15 if stable and BNP back to baseline. Code Status:     Code Status Orders        Start     Ordered   01/20/15 1545  Do not attempt resuscitation (DNR)   Continuous    Question Answer Comment  In the event of cardiac or respiratory ARREST Do not call a "code blue"   In the event of cardiac or respiratory ARREST Do not perform Intubation, CPR, defibrillation or ACLS   In the event of cardiac or respiratory ARREST Use medication by any route, position, wound care, and other measures to relive pain and suffering. May use oxygen, suction and manual treatment of airway obstruction as needed for comfort.      01/20/15 1544    Advance Directive Documentation        Most Recent Value   Type of Advance Directive  Out of facility DNR (pink MOST or yellow form)   Pre-existing out of facility DNR order (yellow form or pink MOST form)     "MOST" Form in Place?          IV Access:    Peripheral IV   Procedures and diagnostic studies:   Dg Abd 1 View  01/20/2015   CLINICAL DATA:  Lower abdominal pain for 1 day.  Initial encounter.  EXAM: ABDOMEN - 1 VIEW  COMPARISON:  Chest in two views abdomen 11/02/2010.  FINDINGS: No free intraperitoneal air is identified. The bowel gas pattern is nonobstructive. There is a massive volume of stool throughout the colon. The patient is status post right hip replacement and fixation of a left intertrochanteric fracture. 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  IMPRESSION: No acute abnormality.  Massive volume of  stool throughout the colon.   Electronically Signed   By: Inge Rise M.D.   On: 01/20/2015 14:37   Dg Chest Port 1 View  01/20/2015   CLINICAL DATA:  Shortness of breath.  EXAM: PORTABLE CHEST - 1 VIEW  COMPARISON:  12/29/2014  FINDINGS: The patient has hazy bilateral perihilar infiltrates with slight pulmonary vascular congestion. There are persistent moderate bilateral pleural effusions with compressive atelectasis in the lower lobes as demonstrated on prior chest CT dated 11/12/2014.  Severe arthritic changes of the right shoulder.  IMPRESSION: Findings consistent congestive heart failure with new bilateral pulmonary edema and increased bilateral pleural effusions and lower lobe compressive atelectasis.   Electronically Signed   By: Lorriane Shire M.D.   On: 01/20/2015 10:46   Dg Chest Port 1 View  12/29/2014   CLINICAL DATA:  81 year old female with pneumonia and history of atrial fibrillation  EXAM: PORTABLE CHEST - 1 VIEW  COMPARISON:  Prior chest x-ray 12/28/2014  FINDINGS: Stable cardiomegaly. Atherosclerotic calcifications again noted in the transverse aorta. Pulmonary vascular congestion with perhaps mild interstitial edema. Findings are slightly progressed compared to prior. Bilateral left larger than right layering pleural effusions and associated bibasilar atelectasis. Infiltrate difficult to exclude in the left base. No acute osseous abnormality.  IMPRESSION: 1. Similar to slightly increased interstitial pulmonary edema. 2. Otherwise unchanged cardiomegaly and left larger than right layering effusions with associated bibasilar atelectasis.   Electronically Signed   By: Jacqulynn Cadet M.D.   On: 12/29/2014 08:20   Dg Chest Port 1 View  12/28/2014   CLINICAL DATA:  Respiratory distress.  EXAM: PORTABLE CHEST - 1 VIEW  COMPARISON:  11/14/2014  FINDINGS: There is cardiac enlargement. Bilateral pleural effusions are identified left greater than right. There is mild diffuse interstitial  edema. Atelectasis is identified overlying the effusions.  IMPRESSION: 1. Congestive heart failure.   Electronically Signed   By: Kerby Moors M.D.   On: 12/28/2014 18:26     Medical Consultants:    None.  Anti-Infectives:    None.  Subjective:   Jennifer Nielsen remains sleepy and confused. Denies pain and dyspnea.   Objective:    Filed Vitals:   01/25/15 1934 01/25/15 2101 01/25/15 2145 01/26/15 0436  BP:  108/60  117/48  Pulse:  96 84 51  Temp:  98.1 F (36.7 C)  97.4 F (36.3 C)  TempSrc:  Oral  Oral  Resp:  20  16  Height:      Weight:    91.173 kg (201 lb)  SpO2: 93% 92%  99%    Intake/Output Summary (Last 24 hours) at 01/26/15 0747 Last data filed at 01/26/15 0441  Gross per 24 hour  Intake   1080 ml  Output   2200 ml  Net  -1120 ml    Exam: Gen:  NAD, sleepy Cardiovascular:  HSIR, No M/R/G Respiratory:  Lungs CTAB Gastrointestinal:  Abdomen soft, NT/ND, + BS Extremities:  + upper and lower extremity edema but improved  Data Reviewed:    Labs: Basic Metabolic Panel:  Recent Labs  Lab 01/21/15 0420 01/22/15 0358 01/23/15 0511 01/24/15 0405 01/25/15 0420  NA 144 144 141 144 145  K 4.4 3.2* 4.7 3.5 3.4*  CL 91* 89* 90* 87* 84*  CO2 41* 48* 44* >50* >50*  GLUCOSE 137* 139* 128* 115* 118*  BUN 33* 36* 39* 28* 22*  CREATININE 0.58 0.65 0.59 0.49 0.37*  CALCIUM 8.8* 8.3* 8.6* 8.3* 8.3*  MG  --   --  2.3  --   --    GFR Estimated Creatinine Clearance: 53.2 mL/min (by C-G formula based on Cr of 0.37). Liver Function Tests:  Recent Labs Lab 01/20/15 1022  AST 53*  ALT 45  ALKPHOS 99  BILITOT 0.9  PROT 6.9  ALBUMIN 3.1*    Recent Labs Lab 01/20/15 1022  LIPASE 15*   CBC:  Recent Labs Lab 01/20/15 1405 01/21/15 0420 01/22/15 0358 01/23/15 0511 01/24/15 0405  WBC 8.2 4.5 3.9* 6.3 5.6  NEUTROABS 7.7  --   --  4.6  --   HGB 10.2* 10.2* 8.7* 9.7* 9.2*  HCT 34.8* 33.9* 29.3* 34.1* 32.9*  MCV 100.0 97.1 98.3 98.0 99.1  PLT  275 277 296 337 281   BNP (last 3 results)  Recent Labs  02/22/14 1417 06/17/14 1050  PROBNP 2244.0* 170.0*   Microbiology Recent Results (from the past 240 hour(s))  Urine culture     Status: None   Collection Time: 01/20/15 11:43 AM  Result Value Ref Range Status   Specimen Description URINE, CATHETERIZED  Final   Special Requests Normal  Final   Culture   Final    >=100,000 COLONIES/mL PSEUDOMONAS AERUGINOSA Performed at [facility]    Report Status 01/22/2015 FINAL  Final   Organism ID, Bacteria PSEUDOMONAS AERUGINOSA  Final      Susceptibility   Pseudomonas aeruginosa - MIC*    CEFTAZIDIME 4 SENSITIVE Sensitive     CIPROFLOXACIN <=0.25 SENSITIVE Sensitive     GENTAMICIN 4 SENSITIVE Sensitive     IMIPENEM 1 SENSITIVE Sensitive     PIP/TAZO <=4 SENSITIVE Sensitive     CEFEPIME 2 SENSITIVE Sensitive     * >=100,000 COLONIES/mL PSEUDOMONAS AERUGINOSA     Medications:   . antiseptic oral rinse  7 mL Mouth Rinse BID  . diltiazem  180 mg Oral Daily  . furosemide  80 mg Intravenous Q12H  . ipratropium-albuterol  3 mL Nebulization TID  . pantoprazole  40 mg Oral Daily  . potassium chloride  30 mEq Oral BID  . predniSONE  40 mg Oral Q breakfast  . rivaroxaban  20 mg Oral Daily   Continuous Infusions:   Time spent: 25 minutes.   LOS: 6 days   [place] [facility] Pager [phone]. If unable to reach me by pager, please call my cell phone at [phone].  *Please refer to [email or web address], password [email or web address] to get updated schedule on who will round on this patient, as hospitalists switch teams weekly. If 7PM-7AM, please contact night-coverage at [email or web address], password [email or web address] for any overnight needs.  01/26/2015, 7:47 AM

## 2015-01-26 NOTE — Progress Notes (Signed)
Eating supper dtr's at bedside.NAD noted

## 2015-01-27 LAB — BASIC METABOLIC PANEL
BUN: 18 mg/dL (ref 6–20)
CREATININE: 0.39 mg/dL — AB (ref 0.44–1.00)
Calcium: 8.6 mg/dL — ABNORMAL LOW (ref 8.9–10.3)
Chloride: 78 mmol/L — ABNORMAL LOW (ref 101–111)
Glucose, Bld: 86 mg/dL (ref 65–99)
Potassium: 4.3 mmol/L (ref 3.5–5.1)
Sodium: 141 mmol/L (ref 135–145)

## 2015-01-27 LAB — BRAIN NATRIURETIC PEPTIDE: B NATRIURETIC PEPTIDE 5: 333.7 pg/mL — AB (ref 0.0–100.0)

## 2015-01-27 MED ORDER — RIVAROXABAN 20 MG PO TABS
20.0000 mg | ORAL_TABLET | Freq: Every day | ORAL | Status: DC
  Administered 2015-01-27: 20 mg via ORAL
  Filled 2015-01-27: qty 1

## 2015-01-27 NOTE — Progress Notes (Signed)
Physical Therapy Treatment Patient Details Name: Jennifer Nielsen MRN: [id number] DOB: [birth year] Today's Date: 01/27/2015    History of Present Illness 81 yo female admitted with CHF, AMS, acute resp failure. Hx of O2 dependency, CHF, COPD. Pt is from SNF    PT Comments    Performed B UE and B LE TE's mostly AAROM with slight end range stretching.  R LE weaker than L LE.  Tolerated session well. "feels good".  Follow Up Recommendations        Equipment Recommendations       Recommendations for Other Services       Precautions / Restrictions      Mobility  Bed Mobility                  Transfers                    Ambulation/Gait                 Stairs            Wheelchair Mobility    Modified Rankin (Stroke Patients Only)       Balance                                    Cognition Arousal/Alertness: Awake/alert Behavior During Therapy: WFL for tasks assessed/performed                        Exercises General Exercises - Upper Extremity Shoulder Flexion: Both;10 reps;Supine;AROM Elbow Flexion: AROM;Both;10 reps;Supine Digit Composite Flexion: AROM;Both;10 reps;Supine Composite Extension: AROM;Both;10 reps;Supine General Exercises - Lower Extremity Ankle Circles/Pumps: AROM;Both;Supine;15 reps Quad Sets: AROM;Both;15 reps;Supine Gluteal Sets: AROM;Both;15 reps;Supine Short Arc Quad: AROM;Both;15 reps;Supine Heel Slides: AAROM;Both;15 reps;Supine Hip ABduction/ADduction: AAROM;Both;15 reps;Supine    General Comments        Pertinent Vitals/Pain Pain Assessment: Faces Faces Pain Scale: Hurts a little bit Pain Location: hips with AAROM Pain Intervention(s): Monitored during session;Repositioned    Home Living                      Prior Function            PT Goals (current goals can now be found in the care plan section)      Frequency       PT Plan      Co-evaluation              End of Session           Time: 1445-1510 PT Time Calculation (min) (ACUTE ONLY): 25 min  Charges:  $Therapeutic Exercise: 23-37 mins                    G Codes:      Rica Koyanagi  PTA [facility]  Acute  Rehab Pager      [phone]

## 2015-01-27 NOTE — Progress Notes (Signed)
Progress Note   Jennifer Nielsen UXN:[id number] DOB: 12-Jan-1925 DOA: 01/20/2015 PCP: [name], MD   Brief Narrative:   Jennifer Nielsen is an 81 y.o. female with a history of chronic respiratory failure on 2 L oxygen, chronic diastolic congestive heart failure, chronic obstructive pulmonary disease, atrial fibrillation on Xarelto,with multiple admissions recently, who was admitted 01/20/15 with acute hypoxic resp failure secondary to COPD and CHF, significant improvement on BiPAP. Initial workup included a chest x-ray which showed findings suggestive of congestive heart failure with new bilateral pulmonary edema and increased bilateral pleural effusions. She was started on 80 mg of Lasix twice daily having subsequent clinical improvement.  Assessment/Plan:   Principal problem:  Acute on chronic hypercarbic respiratory failure, multifactorial - On 2 L nasal cannula at baseline secondary to baseline COPD and chronic diastolic CHF. - Presented with PCO2 of 94, patient is a DNR, her symptoms secondary to pulmonary edema, and COPD exacerbation. - On BiPAP for the first 24 hours, with significant improvement of mental status and breathing back to baseline. - Respiratory status stable with diuresis.  Weight down 16 pounds.  Doubt she is at dry weight yet.  Acute on chronic diastolic CHF - Last 2-D echo 11/10/2014 showed preserved EF of 60-65% and pulmonary pressures of 59 mmHg. - Has been diuresing well with negative I/O balance of 2.4/24 hours. - Renal function remains stable with diuresis. BNP 333.7, not quite at baseline yet. -Continue to monitor daily weights, strict ins and outs. - Continue Lasix 80 mg BID with Zaroxolyn 2.5 mg BID.  - Weight down 16 pounds.  Doubt she is at dry weight yet.  Pseudomonal UTI -Urine culture positive for Pseudomonas. -S/P Fortaz and 1 dose of Fosfomycin.  Acute COPD exacerbation -No wheezing, continue with steroid taper (reduced to 20 mg prednisone daily  01/26/15). Stop steroids after today's dose.  A. fib with RVR - IV Cardizem weaned, now on home oral dose of Cardizem with rate control (heart rate 80s-90s). - Continue Xarelto. - Telemetry discontinued 01/26/15.  Hypokalemia - Increase potassium supplementation dose to 30 mEq while on Lasix. - Monitor.  Anemia - Hemoglobin stable.    DVT Prophylaxis - On Xarelto.  Family Communication: [name], daughter at bedside 01/24/15, no family present today. Left message for [name] [phone]. Disposition Plan: From [facility]. Can return there when fully diuresed, possibly 01/28/15 if at dry weight and BNP back to baseline. Code Status:     Code Status Orders        Start     Ordered   01/20/15 1545  Do not attempt resuscitation (DNR)   Continuous    Question Answer Comment  In the event of cardiac or respiratory ARREST Do not call a "code blue"   In the event of cardiac or respiratory ARREST Do not perform Intubation, CPR, defibrillation or ACLS   In the event of cardiac or respiratory ARREST Use medication by any route, position, wound care, and other measures to relive pain and suffering. May use oxygen, suction and manual treatment of airway obstruction as needed for comfort.      01/20/15 1544    Advance Directive Documentation        Most Recent Value   Type of Advance Directive  Out of facility DNR (pink MOST or yellow form)   Pre-existing out of facility DNR order (yellow form or pink MOST form)     "MOST" Form in Place?  IV Access:    Peripheral IV   Procedures and diagnostic studies:   Dg Abd 1 View  01/20/2015   CLINICAL DATA:  Lower abdominal pain for 1 day.  Initial encounter.  EXAM: ABDOMEN - 1 VIEW  COMPARISON:  Chest in two views abdomen 11/02/2010.  FINDINGS: No free intraperitoneal air is identified. The bowel gas pattern is nonobstructive. There is a massive volume of stool throughout the colon. The patient is status post right hip replacement  and fixation of a left intertrochanteric fracture. 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  IMPRESSION: No acute abnormality.  Massive volume of stool throughout the colon.   Electronically Signed   By: Inge Rise M.D.   On: 01/20/2015 14:37   Dg Chest Port 1 View  01/20/2015   CLINICAL DATA:  Shortness of breath.  EXAM: PORTABLE CHEST - 1 VIEW  COMPARISON:  12/29/2014  FINDINGS: The patient has hazy bilateral perihilar infiltrates with slight pulmonary vascular congestion. There are persistent moderate bilateral pleural effusions with compressive atelectasis in the lower lobes as demonstrated on prior chest CT dated 11/12/2014.  Severe arthritic changes of the right shoulder.  IMPRESSION: Findings consistent congestive heart failure with new bilateral pulmonary edema and increased bilateral pleural effusions and lower lobe compressive atelectasis.   Electronically Signed   By: Lorriane Shire M.D.   On: 01/20/2015 10:46   Dg Chest Port 1 View  12/29/2014   CLINICAL DATA:  81 year old female with pneumonia and history of atrial fibrillation  EXAM: PORTABLE CHEST - 1 VIEW  COMPARISON:  Prior chest x-ray 12/28/2014  FINDINGS: Stable cardiomegaly. Atherosclerotic calcifications again noted in the transverse aorta. Pulmonary vascular congestion with perhaps mild interstitial edema. Findings are slightly progressed compared to prior. Bilateral left larger than right layering pleural effusions and associated bibasilar atelectasis. Infiltrate difficult to exclude in the left base. No acute osseous abnormality.  IMPRESSION: 1. Similar to slightly increased interstitial pulmonary edema. 2. Otherwise unchanged cardiomegaly and left larger than right layering effusions with associated bibasilar atelectasis.   Electronically Signed   By: Jacqulynn Cadet M.D.   On: 12/29/2014 08:20   Dg Chest Port 1 View  12/28/2014   CLINICAL DATA:  Respiratory distress.  EXAM: PORTABLE CHEST - 1 VIEW  COMPARISON:   11/14/2014  FINDINGS: There is cardiac enlargement. Bilateral pleural effusions are identified left greater than right. There is mild diffuse interstitial edema. Atelectasis is identified overlying the effusions.  IMPRESSION: 1. Congestive heart failure.   Electronically Signed   By: Kerby Moors M.D.   On: 12/28/2014 18:26     Medical Consultants:    None.  Anti-Infectives:    None.  Subjective:   Jennifer Nielsen remains sleepy (maybe slightly less so today) and mildly confused. Denies pain and dyspnea.   Objective:    Filed Vitals:   01/26/15 1513 01/26/15 2045 01/26/15 2112 01/27/15 0536  BP: 114/80  134/75 133/70  Pulse: 82  83 92  Temp: 97.7 F (36.5 C)  97.9 F (36.6 C) 97.6 F (36.4 C)  TempSrc: Oral  Oral Oral  Resp: 20  20 20   Height:      Weight:    90.175 kg (198 lb 12.8 oz)  SpO2:  93% 94% 98%    Intake/Output Summary (Last 24 hours) at 01/27/15 0736 Last data filed at 01/27/15 0540  Gross per 24 hour  Intake    720 ml  Output   3125 ml  Net  -2405 ml  Exam: Gen:  NAD, sleepy Cardiovascular:  HSIR, No M/R/G Respiratory:  Lungs CTAB Gastrointestinal:  Abdomen soft, NT/ND, + BS Extremities:  + upper and lower extremity edema persists  Data Reviewed:    Labs: Basic Metabolic Panel:  Recent Labs Lab 01/22/15 0358 01/23/15 0511 01/24/15 0405 01/25/15 0420 01/27/15 0448  NA 144 141 144 145 141  K 3.2* 4.7 3.5 3.4* 4.3  CL 89* 90* 87* 84* 78*  CO2 48* 44* >50* >50* >50*  GLUCOSE 139* 128* 115* 118* 86  BUN 36* 39* 28* 22* 18  CREATININE 0.65 0.59 0.49 0.37* 0.39*  CALCIUM 8.3* 8.6* 8.3* 8.3* 8.6*  MG  --  2.3  --   --   --    GFR Estimated Creatinine Clearance: 52.9 mL/min (by C-G formula based on Cr of 0.39). Liver Function Tests:  Recent Labs Lab 01/20/15 1022  AST 53*  ALT 45  ALKPHOS 99  BILITOT 0.9  PROT 6.9  ALBUMIN 3.1*    Recent Labs Lab 01/20/15 1022  LIPASE 15*   CBC:  Recent Labs Lab 01/20/15 1405  01/21/15 0420 01/22/15 0358 01/23/15 0511 01/24/15 0405  WBC 8.2 4.5 3.9* 6.3 5.6  NEUTROABS 7.7  --   --  4.6  --   HGB 10.2* 10.2* 8.7* 9.7* 9.2*  HCT 34.8* 33.9* 29.3* 34.1* 32.9*  MCV 100.0 97.1 98.3 98.0 99.1  PLT 275 277 296 337 281   BNP (last 3 results)  Recent Labs  02/22/14 1417 06/17/14 1050  PROBNP 2244.0* 170.0*   Microbiology Recent Results (from the past 240 hour(s))  Urine culture     Status: None   Collection Time: 01/20/15 11:43 AM  Result Value Ref Range Status   Specimen Description URINE, CATHETERIZED  Final   Special Requests Normal  Final   Culture   Final    >=100,000 COLONIES/mL PSEUDOMONAS AERUGINOSA Performed at [facility]    Report Status 01/22/2015 FINAL  Final   Organism ID, Bacteria PSEUDOMONAS AERUGINOSA  Final      Susceptibility   Pseudomonas aeruginosa - MIC*    CEFTAZIDIME 4 SENSITIVE Sensitive     CIPROFLOXACIN <=0.25 SENSITIVE Sensitive     GENTAMICIN 4 SENSITIVE Sensitive     IMIPENEM 1 SENSITIVE Sensitive     PIP/TAZO <=4 SENSITIVE Sensitive     CEFEPIME 2 SENSITIVE Sensitive     * >=100,000 COLONIES/mL PSEUDOMONAS AERUGINOSA     Medications:   . antiseptic oral rinse  7 mL Mouth Rinse BID  . diltiazem  180 mg Oral Daily  . furosemide  80 mg Oral BID  . ipratropium-albuterol  3 mL Nebulization TID  . metolazone  2.5 mg Oral BID  . pantoprazole  40 mg Oral Daily  . potassium chloride  30 mEq Oral BID  . rivaroxaban  20 mg Oral Q supper   Continuous Infusions:   Time spent: 25 minutes.   LOS: 7 days   [place] [facility] Pager [phone]. If unable to reach me by pager, please call my cell phone at [phone].  *Please refer to [email or web address], password [email or web address] to get updated schedule on who will round on this patient, as hospitalists switch teams weekly. If 7PM-7AM, please contact night-coverage at [email or web address], password [email or web address] for any overnight needs.  01/27/2015, 7:36 AM

## 2015-01-27 NOTE — Progress Notes (Signed)
CSW continuing to follow.  Pt admitted from [facility] and plans to return when medically ready for discharge.  Per MD, pt not yet medically ready for discharge today.  CSW updated [employer].  CSW to continue to follow to provide support and assist with pt disposition needs.   Alison Murray, MSW, [place] Work [phone]

## 2015-01-27 NOTE — Care Management Important Message (Signed)
Important Message  Patient Details  Name: Jennifer Nielsen MRN: [id number] Date of Birth: 10/19/1924   Medicare Important Message Given:  Yes-third notification given    Shelda Altes 01/27/2015, 4:03 [place] Message  Patient Details  Name: Jennifer Nielsen MRN: [id number] Date of Birth: Sep 03, 1924   Medicare Important Message Given:  Yes-third notification given    Shelda Altes 01/27/2015, 4:03 PM

## 2015-01-28 DIAGNOSIS — F29 Unspecified psychosis not due to a substance or known physiological condition: Secondary | ICD-10-CM | POA: Diagnosis not present

## 2015-01-28 DIAGNOSIS — J441 Chronic obstructive pulmonary disease with (acute) exacerbation: Secondary | ICD-10-CM | POA: Diagnosis not present

## 2015-01-28 DIAGNOSIS — F0390 Unspecified dementia without behavioral disturbance: Secondary | ICD-10-CM | POA: Diagnosis not present

## 2015-01-28 DIAGNOSIS — D638 Anemia in other chronic diseases classified elsewhere: Secondary | ICD-10-CM | POA: Diagnosis not present

## 2015-01-28 DIAGNOSIS — J9622 Acute and chronic respiratory failure with hypercapnia: Secondary | ICD-10-CM

## 2015-01-28 DIAGNOSIS — E876 Hypokalemia: Secondary | ICD-10-CM

## 2015-01-28 DIAGNOSIS — R531 Weakness: Secondary | ICD-10-CM | POA: Diagnosis not present

## 2015-01-28 DIAGNOSIS — J449 Chronic obstructive pulmonary disease, unspecified: Secondary | ICD-10-CM | POA: Diagnosis not present

## 2015-01-28 DIAGNOSIS — N390 Urinary tract infection, site not specified: Secondary | ICD-10-CM | POA: Diagnosis present

## 2015-01-28 DIAGNOSIS — E875 Hyperkalemia: Secondary | ICD-10-CM | POA: Diagnosis not present

## 2015-01-28 DIAGNOSIS — I4891 Unspecified atrial fibrillation: Secondary | ICD-10-CM | POA: Diagnosis present

## 2015-01-28 DIAGNOSIS — E440 Moderate protein-calorie malnutrition: Secondary | ICD-10-CM | POA: Diagnosis not present

## 2015-01-28 DIAGNOSIS — G9340 Encephalopathy, unspecified: Secondary | ICD-10-CM | POA: Diagnosis not present

## 2015-01-28 DIAGNOSIS — F0391 Unspecified dementia with behavioral disturbance: Secondary | ICD-10-CM | POA: Diagnosis not present

## 2015-01-28 DIAGNOSIS — K5900 Constipation, unspecified: Secondary | ICD-10-CM | POA: Diagnosis not present

## 2015-01-28 DIAGNOSIS — J069 Acute upper respiratory infection, unspecified: Secondary | ICD-10-CM | POA: Diagnosis not present

## 2015-01-28 DIAGNOSIS — I5033 Acute on chronic diastolic (congestive) heart failure: Secondary | ICD-10-CM | POA: Diagnosis not present

## 2015-01-28 DIAGNOSIS — J9621 Acute and chronic respiratory failure with hypoxia: Secondary | ICD-10-CM | POA: Diagnosis not present

## 2015-01-28 LAB — BASIC METABOLIC PANEL
BUN: 17 mg/dL (ref 6–20)
CHLORIDE: 72 mmol/L — AB (ref 101–111)
CO2: >50 mmol/L — ABNORMAL HIGH (ref 22–32)
Calcium: 9.2 mg/dL (ref 8.9–10.3)
Glucose, Bld: 92 mg/dL (ref 65–99)
Potassium: 4.5 mmol/L (ref 3.5–5.1)
SODIUM: 141 mmol/L (ref 135–145)

## 2015-01-28 LAB — BRAIN NATRIURETIC PEPTIDE: B Natriuretic Peptide: 190 pg/mL — ABNORMAL HIGH (ref 0.0–100.0)

## 2015-01-28 MED ORDER — METOLAZONE 2.5 MG PO TABS: 2.5000 mg | ORAL_TABLET | Freq: Two times a day (BID) | ORAL | Status: AC

## 2015-01-28 MED ORDER — FUROSEMIDE 40 MG PO TABS: 80.0000 mg | ORAL_TABLET | Freq: Two times a day (BID) | ORAL | Status: DC

## 2015-01-28 MED ORDER — POTASSIUM CHLORIDE CRYS ER 20 MEQ PO TBCR
20.0000 meq | EXTENDED_RELEASE_TABLET | Freq: Two times a day (BID) | ORAL | Status: DC
  Administered 2015-01-28: 20 meq via ORAL
  Filled 2015-01-28: qty 1

## 2015-01-28 MED ORDER — POTASSIUM CHLORIDE CRYS ER 20 MEQ PO TBCR: 20.0000 meq | EXTENDED_RELEASE_TABLET | Freq: Two times a day (BID) | ORAL | Status: DC

## 2015-01-28 NOTE — Progress Notes (Signed)
Pt is currently on a 4L [place] and not on bi-pap. RT will continue to monitor.

## 2015-01-28 NOTE — Discharge Summary (Signed)
Physician Discharge Summary  Jennifer Nielsen BJY:[id number] DOB: July 09, 1924 DOA: 01/20/2015  PCP: [name], MD  Admit date: 01/20/2015 Discharge date: 01/28/2015   Recommendations for Outpatient Follow-Up:   1. Note: Diuretics adjusted.  The patient should have daily weights done & dry weight documented.  She should have a BMET done every other day with adjustment of diuretic therapy if she shows signs of worsening creatinine & to ensure potassium is WNL. 2. Follow a heart healthy, low salt diet and restrict fluid intake to 1.5 liters a day or less.   Discharge Diagnosis:   Principal Problem:    Acute on chronic respiratory failure Active Problems:    Long term current use of anticoagulant therapy    COPD exacerbation    Acute on chronic diastolic CHF (congestive heart failure)    Hypokalemia    UTI (lower urinary tract infection) due to Pseudomonas    Atrial fibrillation with RVR    Anemia of chronic disease   Discharge disposition:  SNF: [facility]  Discharge Condition: Improved.  Diet recommendation: Low sodium, heart healthy.  History of Present Illness:   Jennifer Nielsen is an 81 y.o. female with a history of chronic respiratory failure on 2 L oxygen, chronic diastolic congestive heart failure, chronic obstructive pulmonary disease, atrial fibrillation on Xarelto,with multiple admissions recently, who was admitted 01/20/15 with acute hypoxic resp failure secondary to COPD and CHF, significant improvement on BiPAP. Initial workup included a chest x-ray which showed findings suggestive of congestive heart failure with new bilateral pulmonary edema and increased bilateral pleural effusions. She was started on 80 mg of Lasix twice daily and Zaroxylyn having subsequent clinical improvement.  Hospital Course by Problem:    Principal problem:  Acute on chronic hypercarbic respiratory failure, multifactorial - On 2 L nasal cannula at baseline secondary to baseline COPD and  chronic diastolic CHF. - Presented with PCO2 of 94, patient is a DNR, her symptoms secondary to pulmonary edema, and COPD exacerbation. - On BiPAP for the first 24 hours, with significant improvement of mental status and breathing back to baseline. - Respiratory status stable with diuresis. Weight down 17 pounds. Doubt she is at dry weight yet.  Acute on chronic diastolic CHF - Last 2-D echo 11/10/2014 showed preserved EF of 60-65% and pulmonary pressures of 59 mmHg. - Has been diuresing well with negative I/O balance of 2.5/24 hours. - Renal function remains stable with diuresis. BNP 190.7 (was 25.7 08/11/14), not quite at baseline yet. - Monitored daily weights, strict ins and outs. - Continue Lasix 80 mg BID with Zaroxolyn 2.5 mg BID at discharge.  - Weight down 17 pounds. Doubt she is at dry weight yet, so would continue to montitor closely at SNF.  Pseudomonal UTI - Urine culture positive for Pseudomonas. - S/P Fortaz and 1 dose of Fosfomycin.  Acute COPD exacerbation -No wheezing, status post treatment with steroids which were discontinued 01/26/15.  A. fib with RVR - IV Cardizem weaned, now on home oral dose of Cardizem with rate control (heart rate 60s-80s). - Continue Xarelto. - Telemetry discontinued 01/26/15.  Hypokalemia - Continue supplement, but given rising potassium, reduced dose to 20 mEq twice a day. - Monitor.  Anemia of chronic disease - Hemoglobin stable.  Medical Consultants:    None.   Discharge Exam:   Filed Vitals:   01/28/15 0417  BP: 123/62  Pulse: 80  Temp: 97.5 F (36.4 C)  Resp: 18   Filed Vitals:   01/27/15 1939 01/27/15 2048  01/28/15 0417 01/28/15 0800  BP:  126/54 123/62   Pulse:  66 80   Temp:  97.8 F (36.6 C) 97.5 F (36.4 C)   TempSrc:  Oral Oral   Resp:  18 18   Height:      Weight:   89.7 kg (197 lb 12 oz)   SpO2: 97% 98% 98% 98%    Gen:  NAD Cardiovascular:  RRR, No M/R/G Respiratory: Lungs  CTAB Gastrointestinal: Abdomen soft, NT/ND with normal active bowel sounds. Extremities: No C/E/C   The results of significant diagnostics from this hospitalization (including imaging, microbiology, ancillary and laboratory) are listed below for reference.     Procedures and Diagnostic Studies:      Labs:   Basic Metabolic Panel:  Recent Labs Lab 01/23/15 0511 01/24/15 0405 01/25/15 0420 01/27/15 0448 01/28/15 0550  NA 141 144 145 141 141  K 4.7 3.5 3.4* 4.3 4.5  CL 90* 87* 84* 78* 72*  CO2 44* >50* >50* >50* >50*  GLUCOSE 128* 115* 118* 86 92  BUN 39* 28* 22* 18 17  CREATININE 0.59 0.49 0.37* 0.39* <0.30*  CALCIUM 8.6* 8.3* 8.3* 8.6* 9.2  MG 2.3  --   --   --   --    GFR CrCl cannot be calculated (Patient has no serum creatinine result on file.).  CBC:  Recent Labs Lab 01/22/15 0358 01/23/15 0511 01/24/15 0405  WBC 3.9* 6.3 5.6  NEUTROABS  --  4.6  --   HGB 8.7* 9.7* 9.2*  HCT 29.3* 34.1* 32.9*  MCV 98.3 98.0 99.1  PLT 296 337 281   Microbiology Recent Results (from the past 240 hour(s))  Urine culture     Status: None   Collection Time: 01/20/15 11:43 AM  Result Value Ref Range Status   Specimen Description URINE, CATHETERIZED  Final   Special Requests Normal  Final   Culture   Final    >=100,000 COLONIES/mL PSEUDOMONAS AERUGINOSA Performed at [facility]    Report Status 01/22/2015 FINAL  Final   Organism ID, Bacteria PSEUDOMONAS AERUGINOSA  Final      Susceptibility   Pseudomonas aeruginosa - MIC*    CEFTAZIDIME 4 SENSITIVE Sensitive     CIPROFLOXACIN <=0.25 SENSITIVE Sensitive     GENTAMICIN 4 SENSITIVE Sensitive     IMIPENEM 1 SENSITIVE Sensitive     PIP/TAZO <=4 SENSITIVE Sensitive     CEFEPIME 2 SENSITIVE Sensitive     * >=100,000 COLONIES/mL PSEUDOMONAS AERUGINOSA     Discharge Instructions:       Discharge Instructions    (HEART FAILURE PATIENTS) Call MD:  Anytime you have any of the following symptoms: 1) 3 pound  weight gain in 24 hours or 5 pounds in 1 week 2) shortness of breath, with or without a dry hacking cough 3) swelling in the hands, feet or stomach 4) if you have to sleep on extra pillows at night in order to breathe.    Complete by:  As directed      Diet - low sodium heart healthy    Complete by:  As directed      Discharge instructions    Complete by:  As directed   You were treated for heart failure in the hospital.  To prevent exacerbations of your heart failure, it is important that you check your weight at the same time every day, and that if you gain over 3 pounds in 24 hours or 5 lbs in 1 week, OR  you develop worsening swelling to the legs, experience more shortness of breath or chest pain, take an extra dose of Lasix and call your Primary MD or cardiologist.   Follow a heart healthy, low salt diet and restrict your fluid intake to 1.5 liters a day or less.     Increase activity slowly    Complete by:  As directed             Medication List    TAKE these medications        AMBULATORY NON FORMULARY MEDICATION  Medication Name: Med Pass, 120 mLs by mouth twice daily     antiseptic oral rinse 0.05 % Liqd solution  Commonly known as:  CPC / CETYLPYRIDINIUM CHLORIDE 0.05%  7 mLs by Mouth Rinse route 2 times daily at 12 noon and 4 pm.     budesonide-formoterol 160-4.5 MCG/ACT inhaler  Commonly known as:  SYMBICORT  Inhale 2 puffs into the lungs 2 (two) times daily.     CALTRATE 600+D PO  Take 1 tablet by mouth every morning. For calcium supplement     DECUBI-VITE Caps  Take 1 capsule by mouth daily.     diltiazem 180 MG 24 hr capsule  Commonly known as:  CARDIZEM CD  Take 1 capsule (180 mg total) by mouth daily. For A-Fib     DSS 100 MG Caps  Take 200 mg by mouth daily. For constipation     furosemide 40 MG tablet  Commonly known as:  LASIX  Take 2 tablets (80 mg total) by mouth 2 (two) times daily.     ipratropium-albuterol 0.5-2.5 (3) MG/3ML Soln  Commonly known  as:  DUONEB  15ml nebulized 4 times daily scheduled and every 4hrs as needed for wheezing for 3 days. And then every 4hours as needed for wheezing.     metolazone 2.5 MG tablet  Commonly known as:  ZAROXOLYN  Take 1 tablet (2.5 mg total) by mouth 2 (two) times daily.     OLANZapine 5 MG tablet  Commonly known as:  ZYPREXA  Take 1 tablet (5 mg total) by mouth at bedtime.     OXYGEN  Inhale 2 L into the lungs continuous.     potassium chloride SA 20 MEQ tablet  Commonly known as:  K-DUR,KLOR-CON  Take 1 tablet (20 mEq total) by mouth 2 (two) times daily.     SYSTANE OP  Place 1 drop into both eyes 2 (two) times daily. For dry eyes     TYLENOL 500 MG tablet  Generic drug:  acetaminophen  Take 500 mg by mouth 3 (three) times daily.     XARELTO 20 MG Tabs tablet  Generic drug:  rivaroxaban  Take 20 mg by mouth daily. For DVT       Follow-up Information    Follow up with DAY,JAMES, MD. Schedule an appointment as soon as possible for a visit in 1 week.   Specialty:  Internal Medicine   Why:  Hospital follow up.       Time coordinating discharge: 35 minutes.  Signed:  RAMA,CHRISTINA  Pager [phone] [facility] 01/28/2015, 9:24 AM

## 2015-01-28 NOTE — Discharge Instructions (Signed)

## 2015-01-28 NOTE — Progress Notes (Signed)
Report called to [facility].  Foley removed prior to discharge but patient had not yet voided. This information was given in report for [facility] to follow up.

## 2015-01-28 NOTE — Progress Notes (Signed)
Pt for discharge to [facility].  CSW facilitated pt discharge needs including contacting facility, faxing pt discharge information via [facility], discussing with pt daughters, [name] and [name] via telephone, providing RN phone number to call report, and arranging ambulance transport scheduled for 12:30 pm pick up.   Pt daughters appreciative of communication about disposition.  No further social work needs identified at this time.  CSW signing off.   Alison Murray, MSW, [place] Work [phone]

## 2015-01-29 ENCOUNTER — Encounter: Payer: Self-pay | Admitting: Adult Health

## 2015-01-29 ENCOUNTER — Non-Acute Institutional Stay (SKILLED_NURSING_FACILITY): Payer: Medicare Other | Admitting: Adult Health

## 2015-01-29 DIAGNOSIS — D638 Anemia in other chronic diseases classified elsewhere: Secondary | ICD-10-CM | POA: Diagnosis not present

## 2015-01-29 DIAGNOSIS — K5900 Constipation, unspecified: Secondary | ICD-10-CM | POA: Diagnosis not present

## 2015-01-29 DIAGNOSIS — I5033 Acute on chronic diastolic (congestive) heart failure: Secondary | ICD-10-CM

## 2015-01-29 DIAGNOSIS — J441 Chronic obstructive pulmonary disease with (acute) exacerbation: Secondary | ICD-10-CM

## 2015-01-29 DIAGNOSIS — J9622 Acute and chronic respiratory failure with hypercapnia: Secondary | ICD-10-CM

## 2015-01-29 DIAGNOSIS — E876 Hypokalemia: Secondary | ICD-10-CM | POA: Diagnosis not present

## 2015-01-29 DIAGNOSIS — I4891 Unspecified atrial fibrillation: Secondary | ICD-10-CM | POA: Diagnosis not present

## 2015-01-29 DIAGNOSIS — N390 Urinary tract infection, site not specified: Secondary | ICD-10-CM | POA: Diagnosis not present

## 2015-01-29 DIAGNOSIS — F29 Unspecified psychosis not due to a substance or known physiological condition: Secondary | ICD-10-CM

## 2015-01-29 DIAGNOSIS — F0391 Unspecified dementia with behavioral disturbance: Secondary | ICD-10-CM

## 2015-01-29 NOTE — Progress Notes (Signed)
Patient ID: Jennifer Nielsen, female   DOB: 1924-10-09, 81 y.o.   MRN: [id number]   12/23/14  Facility:  Nursing Home Location:  [place] Room Number: 0174-9 LEVEL OF CARE:  SNF (31)  Chief Complaint  Patient presents with  . Medical Management of Chronic Issues    Chronic diastolic CHF, COPD, chronic A. fib, hypokalemia, dementia, constipation, protein calorie malnutrition and psychosis     HISTORY OF PRESENT ILLNESS:  This is an 81 year old female who is being seen for a routine visit. No SOB noted. She is currently on Lasix 40 mg BID for diastolic CHF. Latest K 4.2 and currently on K supplement.  She has PMH of DVT, chronic A. fib on Xarelto, diastolic CHF, COPD, chronic respiratory failure on home O2 2 L and dementia.   She is a long-term care resident at [facility].  PAST MEDICAL HISTORY:  Past Medical History  Diagnosis Date  . Arrhythmia   . Arthritis   . Atrial fibrillation     Remains stable (As of 05/09/13)  . H/O blood clots   . Hyperlipemia   . GERD (gastroesophageal reflux disease)     Stable (As of 05/09/13)  . Osteoarthritis     Denies pain (As of 05/09/13)  . CHF (congestive heart failure)     chronic diastolic  . COPD (chronic obstructive pulmonary disease)     w/exacerbation  . Dementia     Remains stable & continues to function adequately in the current living environment with supervision. Has had little changes in behavior (AS OF 05/09/13)  . Pleural effusion 11/12/2014  . S/P thoracentesis     CURRENT MEDICATIONS: Reviewed per MAR/see medication list  Allergies  Allergen Reactions  . Chocolate Itching  . Ciprofloxacin Itching  . Coffee Bean Extract [Coffea Arabica] Itching  . Coffee Flavor Itching  . Eggs Or Egg-Derived Products   . Gentamycin [Gentamicin] Itching  . Imodium [Loperamide] Other (See Comments)    unknown  . Penicillins Itching  . Sulfa Antibiotics Itching  . Tea Itching     REVIEW OF  SYSTEMS:  GENERAL: no change in appetite, no fatigue, no weight changes, no fever, chills or weakness RESPIRATORY: no cough, wheezing, hemoptysis CARDIAC: no chest pain, edema or palpitations GI: no abdominal pain, diarrhea, constipation, heart burn, nausea or vomiting  PHYSICAL EXAMINATION  GENERAL: no acute distress, obese EYES: conjunctivae normal, sclerae normal, normal eye lids NECK: supple, trachea midline, no neck masses, no thyroid tenderness, no thyromegaly LYMPHATICS: no LAN in the neck, no supraclavicular LAN RESPIRATORY: breathing is even & unlabored, BS CTAB CARDIAC: irregular heart rate, no murmur,no extra heart sounds, no edema GI: abdomen soft, normal BS, no masses, no tenderness, no hepatomegaly, no splenomegaly EXTREMITIES: Generalized weakness of bilateral lower extremity; able to move BUE PSYCHIATRIC: the patient is alert & oriented to person, affect & behavior appropriate  LABS/RADIOLOGY: Labs reviewed: 12/17/14  NA 142  K 4.2  Glucose 88 BUN 18  Creatinine 0.64  Calcium 8.5 11/28/14  Wbc 9.2  hgb 10.6  hct 33.5  mcv 33.5  Platelet 322  NA 140  K 3.6  Glucose 76  BUN 27  Creatinine 0.64  Calcium 8.2 Basic Metabolic Panel:  Recent Labs  11/16/14 0455  11/18/14 0430        NA 137  < > 139  < >       K 3.2*  < > 2.4*  < >  CL 70*  < > 67*  < >       CO2 >50*  < > >50*  < >       GLUCOSE 100*  < > 109*  < >       BUN 30*  < > 32*  < >       CREATININE 0.48  < > 0.58  < >       CALCIUM 8.5*  < > 9.0  < >       MG 2.0  --  2.1  --        < > = values in this interval not displayed.  Liver Function Tests:   Recent Labs  11/12/14 0345    AST 27    ALT 25    ALKPHOS 59    BILITOT 0.4    PROT 6.1*    ALBUMIN 2.4*      Recent Labs  08/11/14 2251 08/19/14 0510  AMMONIA 22 27     Cardiac Enzymes:  Recent Labs  11/07/14 0845 11/07/14 1713 11/07/14 2302  TROPONINI 0.11* 0.08* 0.06*   Dg Chest 2 View  11/07/2014   CLINICAL DATA:   Shortness breath. Under ext bouts of with labored breathing. Transfer from skilled nursing facility.  EXAM: CHEST - 2 VIEW  COMPARISON:  CT of the chest 10/12/2014 and chest x-ray 10/11/2014.  FINDINGS: Heart is enlarged. Bilateral pleural effusions have increased, left greater than right. Mild edema is noted. Bibasilar airspace opacities are present. Degenerative changes are again noted in both shoulders.  IMPRESSION: 1. Increased interstitial edema and bilateral pleural effusions compatible with congestive heart failure. 2. Bibasilar airspace disease likely reflects atelectasis.   Electronically Signed   By: San Morelle M.D.   On: 11/07/2014 10:46   ASSESSMENT/PLAN:  Chronic diastolic CHF - continue Lasix 40 mg by mouth BID; weigh daily  COPD - continue Symbicort 160-4.5 g/ACT 2 puffs into the lungs twice a day, DuoNeb 4 times a day and every 4 hours when necessary, O2 at 2 L/minute via [place]   Chronic atrial fibrillation - rate controlled; continue Xarelto 20 mg 1 tab by mouth daily and diltiazem 180 mg 1 capsule by mouth daily   Hypokalemia - K 4.2; continue KCl 20 meq 1 tab by mouth daily  Dementia - advanced; continue supportive care  Protein calorie malnutrition, severe - albumin 2.4; continue supplementation  Psychosis -  continue Zyprexa 5 mg 1 tab by mouth daily at bedtime  Constipation - continue Colace 100 mg 1 capsule   Goals of care:  Long-term care   [facility], [facility] [phone]

## 2015-01-29 NOTE — Progress Notes (Signed)
Patient ID: Jennifer Nielsen, female   DOB: November 23, 1924, 81 y.o.   MRN: [id number]    DATE:  01/29/2015 MRN:  [id number]  BIRTHDAY: May 26, 1925  Facility:  Nursing Home Location:  [place] Room Number: 4259-5  LEVEL OF CARE:  SNF [phone])  Contact Information    Name Relation Home Work Mobile   Rackers,Suzanne Daughter [phone]  [phone]   Lanae Boast Daughter [phone]       Chief Complaint  Patient presents with  . Hospitalization Follow-up    Acute on chronic hypercarbic respiratory failure, acute on chronic diastolic chf, UTI, COPD,  A-Fib w/ RVR, Hypokalemia, Anemia, Constipation, Dementia and Psychosis    HISTORY OF PRESENT ILLNESS:  This is an 81 year old female who has been re-admitted to [facility] on 01/28/15 from [facility]. She has PMH of chronic respiratory failure on 2L/min O2, chronic diastolic chf, COPD and A-Fib on Xarelto. She is a long-term resident of [place]. She was sent to the hospital due to acute hypoxic respiratory failure secondary to COPD and CHF. Chest x-ray showed findings suggestive of CHF with new bilateral pulmonary edema and increased bilateral effusions. She was put on BIPAP X 24 HR initially with significant improvement of mental status. She was started on Lasix 80 mg BID with Zaroxolyn 2.5 mg BID. Her weight went down 17 lbs.  She has been admitted for a short-term rehabilitation.  PAST MEDICAL HISTORY:  Past Medical History  Diagnosis Date  . Arrhythmia   . Arthritis   . Atrial fibrillation     Remains stable (As of 05/09/13)  . H/O blood clots   . Hyperlipemia   . GERD (gastroesophageal reflux disease)     Stable (As of 05/09/13)  . Osteoarthritis     Denies pain (As of 05/09/13)  . CHF (congestive heart failure)     chronic diastolic  . COPD (chronic obstructive pulmonary disease)     w/exacerbation  . Dementia     Remains stable & continues to function  adequately in the current living environment with supervision. Has had little changes in behavior (AS OF 05/09/13)  . Pleural effusion 11/12/2014  . S/P thoracentesis      CURRENT MEDICATIONS: Reviewed  Patient's Medications  New Prescriptions   No medications on file  Previous Medications   ACETAMINOPHEN (TYLENOL) 500 MG TABLET    Take 500 mg by mouth 3 (three) times daily.    AMBULATORY NON FORMULARY MEDICATION    Medication Name: Med Pass, 120 mLs by mouth twice daily   ANTISEPTIC ORAL RINSE (CPC / CETYLPYRIDINIUM CHLORIDE 0.05%) 0.05 % LIQD SOLUTION    7 mLs by Mouth Rinse route 2 times daily at 12 noon and 4 pm.   BUDESONIDE-FORMOTEROL (SYMBICORT) 160-4.5 MCG/ACT INHALER    Inhale 2 puffs into the lungs 2 (two) times daily.   CALCIUM CARBONATE-VITAMIN D (CALTRATE 600+D PO)    Take 1 tablet by mouth every morning. For calcium supplement   DILTIAZEM (CARDIZEM CD) 180 MG 24 HR CAPSULE    Take 1 capsule (180 mg total) by mouth daily. For A-Fib   DOCUSATE SODIUM (DSS) 100 MG CAPS    Take 200 mg by mouth daily. For constipation   FUROSEMIDE (LASIX) 40 MG TABLET    Take 2 tablets (80 mg total) by mouth 2 (two) times daily.   IPRATROPIUM-ALBUTEROL (DUONEB) 0.5-2.5 (3) MG/3ML SOLN    64ml nebulized 4 times daily scheduled and every 4hrs as  needed for wheezing for 3 days. And then every 4hours as needed for wheezing.   METOLAZONE (ZAROXOLYN) 2.5 MG TABLET    Take 1 tablet (2.5 mg total) by mouth 2 (two) times daily.   MULTIPLE VITAMINS-MINERALS (DECUBI-VITE) CAPS    Take 1 capsule by mouth daily.   OLANZAPINE (ZYPREXA) 5 MG TABLET    Take 1 tablet (5 mg total) by mouth at bedtime.   OXYGEN    Inhale 2 L into the lungs continuous.    POLYETHYL GLYCOL-PROPYL GLYCOL (SYSTANE OP)    Place 1 drop into both eyes 2 (two) times daily. For dry eyes   POTASSIUM CHLORIDE SA (K-DUR,KLOR-CON) 20 MEQ TABLET    Take 1 tablet (20 mEq total) by mouth 2 (two) times daily.   RIVAROXABAN (XARELTO) 20 MG TABS TABLET     Take 20 mg by mouth daily. For DVT  Modified Medications   No medications on file  Discontinued Medications   No medications on file     Allergies  Allergen Reactions  . Chocolate Itching  . Ciprofloxacin Itching  . Coffee Bean Extract [Coffea Arabica] Itching  . Coffee Flavor Itching  . Eggs Or Egg-Derived Products   . Gentamycin [Gentamicin] Itching  . Imodium [Loperamide] Other (See Comments)    unknown  . Penicillins Itching  . Sulfa Antibiotics Itching  . Tea Itching    REVIEW OF SYSTEMS:  GENERAL: no change in appetite, no fatigue, no weight changes, no fever, chills or weakness EYES: Denies change in vision, dry eyes, eye pain, itching or discharge EARS: Denies change in hearing, ringing in ears, or earache NOSE: Denies nasal congestion or epistaxis MOUTH and THROAT: Denies oral discomfort, gingival pain or bleeding, pain from teeth or hoarseness   RESPIRATORY: no cough, SOB, DOE, wheezing, hemoptysis CARDIAC: no chest pain, or palpitations GI: no abdominal pain, diarrhea, constipation, heart burn, nausea or vomiting GU: Denies dysuria, frequency, hematuria or discharge PSYCHIATRIC: Denies feeling of depression or anxiety. No report of hallucinations, insomnia, paranoia, or agitation   PHYSICAL EXAMINATION  GENERAL APPEARANCE: Well nourished. In no acute distress. Normal body habitus HEAD: Normal in size and contour. No evidence of trauma EYES: Lids open and close normally. No blepharitis, entropion or ectropion. PERRL. Conjunctivae are clear and sclerae are white. Lenses are without opacity EARS: Pinnae are normal. Patient hears normal voice tunes of the examiner MOUTH and THROAT: Lips are without lesions. Oral mucosa is moist and without lesions. Tongue is normal in shape, size, and color and without lesions NECK: supple, trachea midline, no neck masses, no thyroid tenderness, no thyromegaly LYMPHATICS: no LAN in the neck, no supraclavicular LAN RESPIRATORY:  breathing is even & unlabored, BS CTAB CARDIAC: irregularly irregular, no murmur,no extra heart sounds, BLE edema 1+ GI: abdomen soft, normal BS, no masses, no tenderness, no hepatomegaly, no splenomegaly MUSCULOSKELETAL: No deformities. Movement at each extremity is full and painless. Strength is 5/5 at each extremity. Back is without kyphosis or scoliosis PSYCHIATRIC: Alert and oriented X 3. Affect and behavior are appropriate  LABS/RADIOLOGY: Labs reviewed: Basic Metabolic Panel:  Recent Labs  11/16/14 0455  11/18/14 0430  01/23/15 0511  01/25/15 0420 01/27/15 0448 01/28/15 0550  NA 137  < > 139  < > 141  < > 145 141 141  K 3.2*  < > 2.4*  < > 4.7  < > 3.4* 4.3 4.5  CL 70*  < > 67*  < > 90*  < > 84* 78* 72*  CO2 >50*  < > >50*  < > 44*  < > >50* >50* >50*  GLUCOSE 100*  < > 109*  < > 128*  < > 118* 86 92  BUN 30*  < > 32*  < > 39*  < > 22* 18 17  CREATININE 0.48  < > 0.58  < > 0.59  < > 0.37* 0.39* <0.30*  CALCIUM 8.5*  < > 9.0  < > 8.6*  < > 8.3* 8.6* 9.2  MG 2.0  --  2.1  --  2.3  --   --   --   --   < > = values in this interval not displayed. Liver Function Tests:  Recent Labs  11/12/14 0345 12/28/14 1742 01/20/15 1022  AST 27 32 53*  ALT 25 34 45  ALKPHOS 59 92 99  BILITOT 0.4 0.9 0.9  PROT 6.1* 7.7 6.9  ALBUMIN 2.4* 3.5 3.1*    Recent Labs  01/20/15 1022  LIPASE 15*    Recent Labs  08/11/14 2251 08/19/14 0510  AMMONIA 22 27   CBC:  Recent Labs  12/28/14 1742  01/20/15 1405  01/22/15 0358 01/23/15 0511 01/24/15 0405  WBC 16.2*  < > 8.2  < > 3.9* 6.3 5.6  NEUTROABS 14.0*  --  7.7  --   --  4.6  --   HGB 10.9*  < > 10.2*  < > 8.7* 9.7* 9.2*  HCT 37.4  < > 34.8*  < > 29.3* 34.1* 32.9*  MCV 102.7*  < > 100.0  < > 98.3 98.0 99.1  PLT 441*  < > 275  < > 296 337 281  < > = values in this interval not displayed.  Cardiac Enzymes:  Recent Labs  11/07/14 0845 11/07/14 1713 11/07/14 2302  TROPONINI 0.11* 0.08* 0.06*   CBG:  Recent Labs   01/02/15 0426 01/02/15 0738 01/02/15 1246  GLUCAP 115* 109* 225*     Dg Abd 1 View  01/20/2015   CLINICAL DATA:  Lower abdominal pain for 1 day.  Initial encounter.  EXAM: ABDOMEN - 1 VIEW  COMPARISON:  Chest in two views abdomen 11/02/2010.  FINDINGS: No free intraperitoneal air is identified. The bowel gas pattern is nonobstructive. There is a massive volume of stool throughout the colon. The patient is status post right hip replacement and fixation of a left intertrochanteric fracture. 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  IMPRESSION: No acute abnormality.  Massive volume of stool throughout the colon.   Electronically Signed   By: Inge Rise M.D.   On: 01/20/2015 14:37   Dg Chest Port 1 View  01/20/2015   CLINICAL DATA:  Shortness of breath.  EXAM: PORTABLE CHEST - 1 VIEW  COMPARISON:  12/29/2014  FINDINGS: The patient has hazy bilateral perihilar infiltrates with slight pulmonary vascular congestion. There are persistent moderate bilateral pleural effusions with compressive atelectasis in the lower lobes as demonstrated on prior chest CT dated 11/12/2014.  Severe arthritic changes of the right shoulder.  IMPRESSION: Findings consistent congestive heart failure with new bilateral pulmonary edema and increased bilateral pleural effusions and lower lobe compressive atelectasis.   Electronically Signed   By: Lorriane Shire M.D.   On: 01/20/2015 10:46    ASSESSMENT/PLAN:  Acute on chronic hypercarbic respiratory failure, multifactorial - had BIPAP X 24HR in hospital; continue O2 @ 2L/min via [place] continuously  Acute on chronic diastolic CHF - BNP 831; continue Lasix 80 mg BID and Zaroxolyn 2.5  mg BID; check BMP Q M-W-F   X 3 weeks,  then Q M and Th X 3 weeks, then weekly X 4 weeks  UTI, Pseudomonas - S/P Fortaz and 1 dose of Fosfomycin  Acute COPD Exacerbation - continue Symbicort 160-4.5 mcg/ACT 2 puffs into the lungs BID and Duoneb 0.5-2.5 mg/45ml 1 neb QID X 3 days and Q 4  hours PRN  A-Fib with RVR - rate-controlled; continue Cardizem 180 mg 24 hr 1 capsule daily  Hypokalemia - K 4.5; continue KCL 20 mer ER 1 tab PO BID  Anemia of chronic disease - hgb 9.2; check CBC in 2 weeks  Constipation - continue Colace 200 mg daily  Psychosis - continue Zyprexa 5 mg 1 tab PO Q HS  Dementia - advanced; continue supportive care     Goals of care:  Long-term rehabilitation   [facility], NP [facility] [phone]

## 2015-01-31 DIAGNOSIS — I119 Hypertensive heart disease without heart failure: Secondary | ICD-10-CM | POA: Diagnosis not present

## 2015-02-03 ENCOUNTER — Non-Acute Institutional Stay (SKILLED_NURSING_FACILITY): Payer: Medicare Other | Admitting: Internal Medicine

## 2015-02-03 DIAGNOSIS — I482 Chronic atrial fibrillation, unspecified: Secondary | ICD-10-CM

## 2015-02-03 DIAGNOSIS — F0630 Mood disorder due to known physiological condition, unspecified: Secondary | ICD-10-CM

## 2015-02-03 DIAGNOSIS — J9622 Acute and chronic respiratory failure with hypercapnia: Secondary | ICD-10-CM

## 2015-02-03 DIAGNOSIS — K5900 Constipation, unspecified: Secondary | ICD-10-CM | POA: Diagnosis not present

## 2015-02-03 DIAGNOSIS — D638 Anemia in other chronic diseases classified elsewhere: Secondary | ICD-10-CM | POA: Diagnosis not present

## 2015-02-03 DIAGNOSIS — E46 Unspecified protein-calorie malnutrition: Secondary | ICD-10-CM | POA: Diagnosis not present

## 2015-02-03 DIAGNOSIS — N390 Urinary tract infection, site not specified: Secondary | ICD-10-CM | POA: Diagnosis not present

## 2015-02-03 DIAGNOSIS — I5033 Acute on chronic diastolic (congestive) heart failure: Secondary | ICD-10-CM | POA: Diagnosis not present

## 2015-02-04 NOTE — Progress Notes (Signed)
Patient ID: Jennifer Nielsen, female   DOB: 05/06/1925, 81 y.o.   MRN: [id number]      [place]  PCP: [name], MD  Code Status: DNR  Allergies  Allergen Reactions  . Chocolate Itching  . Ciprofloxacin Itching  . Coffee Bean Extract [Coffea Arabica] Itching  . Coffee Flavor Itching  . Eggs Or Egg-Derived Products   . Gentamycin [Gentamicin] Itching  . Imodium [Loperamide] Other (See Comments)    unknown  . Penicillins Itching  . Sulfa Antibiotics Itching  . Tea Itching    Chief Complaint  Patient presents with  . Readmit To SNF     HPI:  81 y.o. patient is here for long term care post hospital admission from 01/20/15-01/28/15 with acute on chronic hypoxic respiratory failure from COPD exacerbation, CHF exacerbation. She responded well to BiPAP and diuresis. She diuresed 17 lbs. She was also treated for pseudomonas UTI this admission.  She has past medical history of chronic respiratory failure on 2 L of home oxygen, chronic diastolic CHF, COPD, afib, anemia of chronic disease and has had multiple admission for acute exacerbation of her chronic respiratory failure. She is now on o2 by nasal canula. She is seen in her room today. She is alert and oriented to person and place and sitting on chair.   Review of Systems:  Constitutional: Negative for fever, chills, diaphoresis. positive for fatigue HENT: Negative for headache, congestion Eyes: Negative for eye pain, blurred vision, double vision and discharge.  Respiratory: Negative for cough, shortness of breath and wheezing.  Cardiovascular: Negative for chest pain, palpitations, leg swelling.  Gastrointestinal: Negative for heartburn, nausea, vomiting, abdominal pain. Had a bowel movement yesterday Genitourinary: Negative for dysuria Musculoskeletal: Negative for back pain, falls in facility. Positive for weakness Skin: Negative for itching, rash.  Neurological: Negative for dizziness,  tingling Psychiatric/Behavioral: Negative for depression    Past Medical History  Diagnosis Date  . Arrhythmia   . Arthritis   . Atrial fibrillation     Remains stable (As of 05/09/13)  . H/O blood clots   . Hyperlipemia   . GERD (gastroesophageal reflux disease)     Stable (As of 05/09/13)  . Osteoarthritis     Denies pain (As of 05/09/13)  . CHF (congestive heart failure)     chronic diastolic  . COPD (chronic obstructive pulmonary disease)     w/exacerbation  . Dementia     Remains stable & continues to function adequately in the current living environment with supervision. Has had little changes in behavior (AS OF 05/09/13)  . Pleural effusion 11/12/2014  . S/P thoracentesis    Past Surgical History  Procedure Laterality Date  . Appendectomy    . Gallbladder surgery    . Hip surgery      Right-Metal plate   . Leg surgery      Left leg   Social History:   reports that she has quit smoking. She has never used smokeless tobacco. She reports that she does not drink alcohol or use illicit drugs.  Family History  Problem Relation Age of Onset  . Colon cancer Neg Hx   . Heart disease Father   . Breast cancer Daughter     Medications:   Medication List       This list is accurate as of: 02/03/15 11:59 PM.  Always use your most recent med list.               AMBULATORY NON  FORMULARY MEDICATION  Medication Name: Med Pass, 120 mLs by mouth twice daily     antiseptic oral rinse 0.05 % Liqd solution  Commonly known as:  CPC / CETYLPYRIDINIUM CHLORIDE 0.05%  7 mLs by Mouth Rinse route 2 times daily at 12 noon and 4 pm.     budesonide-formoterol 160-4.5 MCG/ACT inhaler  Commonly known as:  SYMBICORT  Inhale 2 puffs into the lungs 2 (two) times daily.     CALTRATE 600+D PO  Take 1 tablet by mouth every morning. For calcium supplement     DECUBI-VITE Caps  Take 1 capsule by mouth daily.     diltiazem 180 MG 24 hr capsule  Commonly known as:  CARDIZEM CD   Take 1 capsule (180 mg total) by mouth daily. For A-Fib     DSS 100 MG Caps  Take 200 mg by mouth daily. For constipation     furosemide 40 MG tablet  Commonly known as:  LASIX  Take 2 tablets (80 mg total) by mouth 2 (two) times daily.     ipratropium-albuterol 0.5-2.5 (3) MG/3ML Soln  Commonly known as:  DUONEB  7ml nebulized 4 times daily scheduled and every 4hrs as needed for wheezing for 3 days. And then every 4hours as needed for wheezing.     metolazone 2.5 MG tablet  Commonly known as:  ZAROXOLYN  Take 1 tablet (2.5 mg total) by mouth 2 (two) times daily.     OLANZapine 5 MG tablet  Commonly known as:  ZYPREXA  Take 1 tablet (5 mg total) by mouth at bedtime.     OXYGEN  Inhale 2 L into the lungs continuous.     potassium chloride SA 20 MEQ tablet  Commonly known as:  K-DUR,KLOR-CON  Take 1 tablet (20 mEq total) by mouth 2 (two) times daily.     SYSTANE OP  Place 1 drop into both eyes 2 (two) times daily. For dry eyes     TYLENOL 500 MG tablet  Generic drug:  acetaminophen  Take 500 mg by mouth 3 (three) times daily.     XARELTO 20 MG Tabs tablet  Generic drug:  rivaroxaban  Take 20 mg by mouth daily. For DVT         Physical Exam: Filed Vitals:   02/03/15 1743  BP: 131/61  Pulse: 86  Temp: 97.8 F (36.6 C)  Resp: 16  Weight: 188 lb 3.2 oz (85.367 kg)  SpO2: 95%   General- elderly female, obese, chronically ill appearing, in no acute distress Head- normocephalic, atraumatic Throat- moist mucus membrane Eyes- no pallor, no icterus, no discharge, normal conjunctiva, normal sclera Neck- no cervical lymphadenopathy Cardiovascular- normal s1,s2, no murmurs, no leg edema Respiratory- bilateral poor inspiratory effort with decreased air entry, on o2, no wheeze, no rhonchi, no crackles, no use of accessory muscles Abdomen- bowel sounds present, soft, non tender Musculoskeletal- generalized weakness more in lower extremities Neurological- alert and  oriented this visit Skin- warm and dry Psychiatry- calm with normal affect   Labs reviewed: Basic Metabolic Panel:  Recent Labs  11/16/14 0455  11/18/14 0430  01/23/15 0511  01/25/15 0420 01/27/15 0448 01/28/15 0550  NA 137  < > 139  < > 141  < > 145 141 141  K 3.2*  < > 2.4*  < > 4.7  < > 3.4* 4.3 4.5  CL 70*  < > 67*  < > 90*  < > 84* 78* 72*  CO2 >50*  < > >  50*  < > 44*  < > >50* >50* >50*  GLUCOSE 100*  < > 109*  < > 128*  < > 118* 86 92  BUN 30*  < > 32*  < > 39*  < > 22* 18 17  CREATININE 0.48  < > 0.58  < > 0.59  < > 0.37* 0.39* <0.30*  CALCIUM 8.5*  < > 9.0  < > 8.6*  < > 8.3* 8.6* 9.2  MG 2.0  --  2.1  --  2.3  --   --   --   --   < > = values in this interval not displayed. Liver Function Tests:  Recent Labs  11/12/14 0345 12/28/14 1742 01/20/15 1022  AST 27 32 53*  ALT 25 34 45  ALKPHOS 59 92 99  BILITOT 0.4 0.9 0.9  PROT 6.1* 7.7 6.9  ALBUMIN 2.4* 3.5 3.1*    Recent Labs  01/20/15 1022  LIPASE 15*    Recent Labs  08/11/14 2251 08/19/14 0510  AMMONIA 22 27   CBC:  Recent Labs  12/28/14 1742  01/20/15 1405  01/22/15 0358 01/23/15 0511 01/24/15 0405  WBC 16.2*  < > 8.2  < > 3.9* 6.3 5.6  NEUTROABS 14.0*  --  7.7  --   --  4.6  --   HGB 10.9*  < > 10.2*  < > 8.7* 9.7* 9.2*  HCT 37.4  < > 34.8*  < > 29.3* 34.1* 32.9*  MCV 102.7*  < > 100.0  < > 98.3 98.0 99.1  PLT 441*  < > 275  < > 296 337 281  < > = values in this interval not displayed.  01/31/15 wbc 7.8, hb 10.5, hct 33.4, plt 255, na 144, k 3.4, glu 84, cr 0.46, cl 48, co2 > 45, ca 8.8  Assessment/Plan  Acute on chronic respiratory failure   This admission treated for copd and chf exacerbation. Continue o2 and bronchodilators.   Chronic diastolic CHF Monitor breathing and weight. continue Lasix 80 mg bid with zaroxolyn 2.5 mg bid, o2. monitor bmp, continue kcl supplement. Continue fluid restriction of 1.5 l  COPD Continue o2, completed antibiotic and prednisone. Continue  duoneb and symbicort. Has chronic co2 retention, monitor clinically  psuedomonas uti Completed her antibiotic, currently asymptomatic, monitor  Chronic atrial fibrillation Rate controlled. continue Cardizem 180 mg daily and Xarelto 20 mg daily   Anemia of chronic disease Stable, Monitor cbc  Protein calorie malnutrition Continue protein supplement, fortified food, MVI. Skin care with pressure ulcer prophylaxis. Continue decubivite  Constipation Continue DSS 200 mg daily  Mood disorder Stable with zyprexa 5 mg daily   Goals of care: long term care   Labs/tests ordered: none  Family/ staff Communication: reviewed care plan with patient and nursing supervisor    Blanchie Serve, MD  [facility] [phone] (Monday-Friday 8 am - 5 pm) [phone] (afterhours)

## 2015-02-07 DIAGNOSIS — F0390 Unspecified dementia without behavioral disturbance: Secondary | ICD-10-CM | POA: Diagnosis not present

## 2015-02-07 DIAGNOSIS — F29 Unspecified psychosis not due to a substance or known physiological condition: Secondary | ICD-10-CM | POA: Diagnosis not present

## 2015-02-11 DIAGNOSIS — D649 Anemia, unspecified: Secondary | ICD-10-CM | POA: Diagnosis not present

## 2015-02-18 DIAGNOSIS — D649 Anemia, unspecified: Secondary | ICD-10-CM | POA: Diagnosis not present

## 2015-02-19 DIAGNOSIS — I5023 Acute on chronic systolic (congestive) heart failure: Secondary | ICD-10-CM | POA: Diagnosis not present

## 2015-02-20 DIAGNOSIS — I5023 Acute on chronic systolic (congestive) heart failure: Secondary | ICD-10-CM | POA: Diagnosis not present

## 2015-02-22 DIAGNOSIS — E46 Unspecified protein-calorie malnutrition: Secondary | ICD-10-CM | POA: Insufficient documentation

## 2015-02-22 DIAGNOSIS — K5900 Constipation, unspecified: Secondary | ICD-10-CM | POA: Insufficient documentation

## 2015-02-24 DIAGNOSIS — I5023 Acute on chronic systolic (congestive) heart failure: Secondary | ICD-10-CM | POA: Diagnosis not present

## 2015-02-26 DIAGNOSIS — I5023 Acute on chronic systolic (congestive) heart failure: Secondary | ICD-10-CM | POA: Diagnosis not present

## 2015-02-27 DIAGNOSIS — D649 Anemia, unspecified: Secondary | ICD-10-CM | POA: Diagnosis not present

## 2015-03-03 DIAGNOSIS — I5023 Acute on chronic systolic (congestive) heart failure: Secondary | ICD-10-CM | POA: Diagnosis not present

## 2015-03-04 DIAGNOSIS — I5023 Acute on chronic systolic (congestive) heart failure: Secondary | ICD-10-CM | POA: Diagnosis not present

## 2015-03-04 LAB — BASIC METABOLIC PANEL
BUN: 20 mg/dL (ref 4–21)
CREATININE: 0.5 mg/dL (ref 0.5–1.1)
Glucose: 87 mg/dL
POTASSIUM: 3.5 mmol/L (ref 3.4–5.3)
SODIUM: 139 mmol/L (ref 137–147)

## 2015-03-05 DIAGNOSIS — M2011 Hallux valgus (acquired), right foot: Secondary | ICD-10-CM | POA: Diagnosis not present

## 2015-03-05 DIAGNOSIS — L602 Onychogryphosis: Secondary | ICD-10-CM | POA: Diagnosis not present

## 2015-03-05 DIAGNOSIS — L97429 Non-pressure chronic ulcer of left heel and midfoot with unspecified severity: Secondary | ICD-10-CM | POA: Diagnosis not present

## 2015-03-05 DIAGNOSIS — M2012 Hallux valgus (acquired), left foot: Secondary | ICD-10-CM | POA: Diagnosis not present

## 2015-03-12 DIAGNOSIS — H903 Sensorineural hearing loss, bilateral: Secondary | ICD-10-CM | POA: Diagnosis not present

## 2015-03-12 DIAGNOSIS — H6123 Impacted cerumen, bilateral: Secondary | ICD-10-CM | POA: Diagnosis not present

## 2015-03-18 DIAGNOSIS — D509 Iron deficiency anemia, unspecified: Secondary | ICD-10-CM | POA: Diagnosis not present

## 2015-03-18 LAB — BASIC METABOLIC PANEL
BUN: 36 mg/dL — AB (ref 4–21)
CREATININE: 0.9 mg/dL (ref 0.5–1.1)
Glucose: 120 mg/dL
Potassium: 3.1 mmol/L — AB (ref 3.4–5.3)
Sodium: 141 mmol/L (ref 137–147)

## 2015-03-21 DIAGNOSIS — D649 Anemia, unspecified: Secondary | ICD-10-CM | POA: Diagnosis not present

## 2015-03-21 LAB — BASIC METABOLIC PANEL
BUN: 42 mg/dL — AB (ref 4–21)
Creatinine: 0.9 mg/dL (ref 0.5–1.1)
GLUCOSE: 101 mg/dL
Potassium: 3.3 mmol/L — AB (ref 3.4–5.3)
Sodium: 139 mmol/L (ref 137–147)

## 2015-03-25 DIAGNOSIS — I10 Essential (primary) hypertension: Secondary | ICD-10-CM | POA: Diagnosis not present

## 2015-03-25 LAB — BASIC METABOLIC PANEL
BUN: 37 mg/dL — AB (ref 4–21)
CREATININE: 0.7 mg/dL (ref 0.5–1.1)
Glucose: 100 mg/dL
POTASSIUM: 3 mmol/L — AB (ref 3.4–5.3)
Sodium: 143 mmol/L (ref 137–147)

## 2015-04-02 DIAGNOSIS — I10 Essential (primary) hypertension: Secondary | ICD-10-CM | POA: Diagnosis not present

## 2015-04-02 LAB — BASIC METABOLIC PANEL
BUN: 31 mg/dL — AB (ref 4–21)
Creatinine: 0.7 mg/dL (ref 0.5–1.1)
GLUCOSE: 95 mg/dL
POTASSIUM: 2.7 mmol/L — AB (ref 3.4–5.3)
Sodium: 140 mmol/L (ref 137–147)

## 2015-04-06 IMAGING — CR DG CHEST 1V PORT
1 series · 1 of 1 positions shown · non-contrast
Comparison: None.

CLINICAL DATA: Shortness of breath

EXAM:
PORTABLE CHEST - 1 VIEW

[AP]
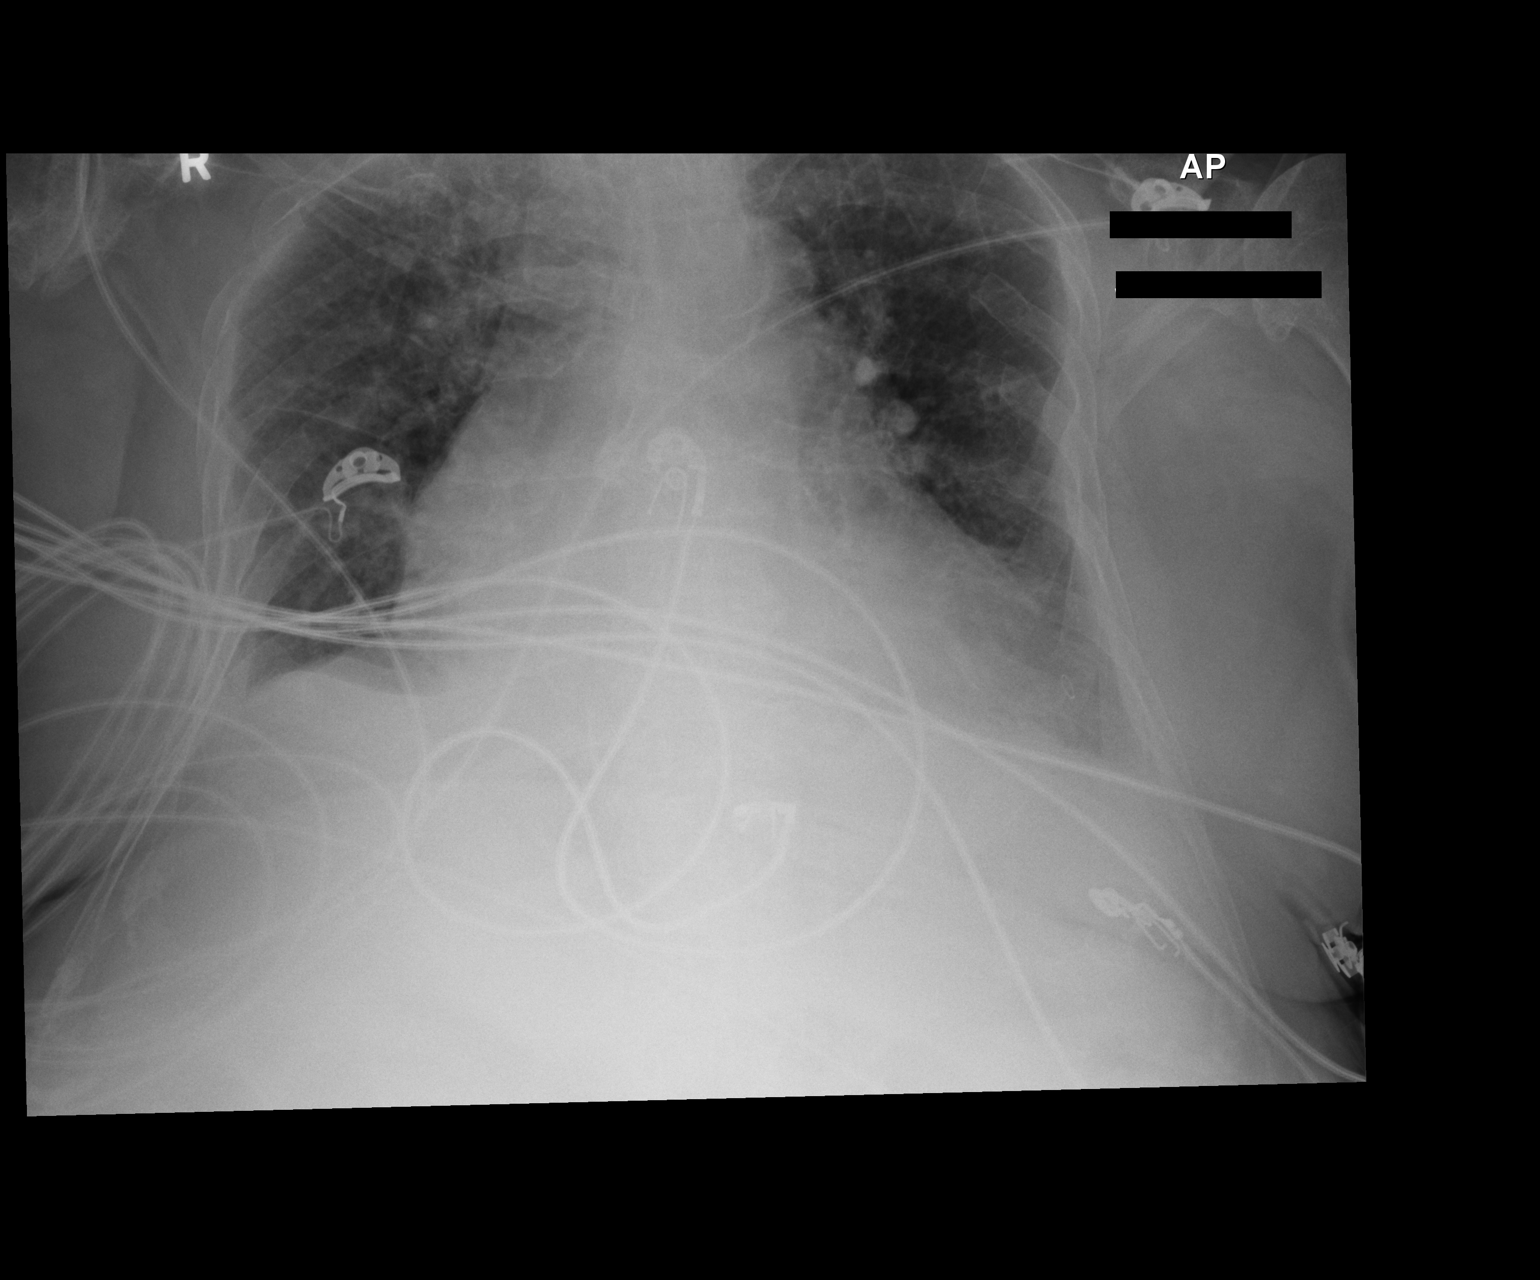

[1 of 1 positions shown; findings below may reference images not displayed]

FINDINGS: 0032 hrs. The cardio pericardial silhouette is enlarged. There is
pulmonary vascular congestion without overt pulmonary edema.
Superimposed interstitial opacities suggest edema. Probable tiny
bilateral pleural effusions. Bones are diffusely demineralized.
Telemetry leads overlie the chest.
IMPRESSION: Cardiomegaly with vascular congestion and interstitial edema.

## 2015-04-07 DIAGNOSIS — I5023 Acute on chronic systolic (congestive) heart failure: Secondary | ICD-10-CM | POA: Diagnosis not present

## 2015-04-07 LAB — BASIC METABOLIC PANEL
BUN: 37 mg/dL — AB (ref 4–21)
CREATININE: 0.9 mg/dL (ref 0.5–1.1)
Glucose: 97 mg/dL
Potassium: 3.1 mmol/L — AB (ref 3.4–5.3)
SODIUM: 138 mmol/L (ref 137–147)

## 2015-05-02 DIAGNOSIS — F29 Unspecified psychosis not due to a substance or known physiological condition: Secondary | ICD-10-CM | POA: Diagnosis not present

## 2015-05-02 DIAGNOSIS — F0390 Unspecified dementia without behavioral disturbance: Secondary | ICD-10-CM | POA: Diagnosis not present

## 2015-05-27 DIAGNOSIS — F22 Delusional disorders: Secondary | ICD-10-CM | POA: Diagnosis not present

## 2015-05-27 DIAGNOSIS — F0390 Unspecified dementia without behavioral disturbance: Secondary | ICD-10-CM | POA: Diagnosis not present

## 2015-06-27 NOTE — Addendum Note (Signed)
Addended by: Bonney Leitz T on: 06/27/2015 11:44 AM   Modules accepted: Orders, Medications

## 2015-06-27 NOTE — Addendum Note (Signed)
Addended by: Bonney Leitz T on: 06/27/2015 02:51 PM   Modules accepted: Medications

## 2015-07-10 ENCOUNTER — Encounter: Payer: Self-pay | Admitting: Adult Health

## 2015-07-10 ENCOUNTER — Non-Acute Institutional Stay (SKILLED_NURSING_FACILITY): Payer: Medicare Other | Admitting: Adult Health

## 2015-07-10 DIAGNOSIS — E876 Hypokalemia: Secondary | ICD-10-CM

## 2015-07-10 DIAGNOSIS — I5032 Chronic diastolic (congestive) heart failure: Secondary | ICD-10-CM

## 2015-07-10 DIAGNOSIS — E46 Unspecified protein-calorie malnutrition: Secondary | ICD-10-CM

## 2015-07-10 DIAGNOSIS — J449 Chronic obstructive pulmonary disease, unspecified: Secondary | ICD-10-CM | POA: Diagnosis not present

## 2015-07-10 DIAGNOSIS — F29 Unspecified psychosis not due to a substance or known physiological condition: Secondary | ICD-10-CM | POA: Diagnosis not present

## 2015-07-10 DIAGNOSIS — F0391 Unspecified dementia with behavioral disturbance: Secondary | ICD-10-CM | POA: Diagnosis not present

## 2015-07-10 DIAGNOSIS — D638 Anemia in other chronic diseases classified elsewhere: Secondary | ICD-10-CM | POA: Diagnosis not present

## 2015-07-10 DIAGNOSIS — I482 Chronic atrial fibrillation, unspecified: Secondary | ICD-10-CM

## 2015-07-10 DIAGNOSIS — K5900 Constipation, unspecified: Secondary | ICD-10-CM | POA: Diagnosis not present

## 2015-07-10 NOTE — Progress Notes (Signed)
Patient ID: Jennifer Nielsen, female   DOB: September 07, 1924, 82 y.o.   MRN: [id number]

## 2015-07-10 NOTE — Progress Notes (Signed)
Patient ID: Jennifer Nielsen, female   DOB: 1924-12-30, 82 y.o.   MRN: [id number]    DATE:  07/10/2015   MRN:  [id number]  BIRTHDAY: Jul 22, 1924  Facility:  Nursing Home Location:  [place] Room Number: [id number]  LEVEL OF CARE:  SNF [phone])  Contact Information    Name Relation Home Work Mobile   Luckenbaugh,Suzanne Daughter [phone]  [phone]   Lanae Boast Daughter [phone]       Chief Complaint  Patient presents with  . Medical Management of Chronic Issues    Chronic diastolic CHF, COPD, atrial fibrillation, hypokalemia, anemia, constipation, psychosis and dementia    HISTORY OF PRESENT ILLNESS:  This is an 82 year old female who is being seen for a routine visit. She has been stable for the past month. She has been treated for hypokalemia and is now on supplementation. She was recently started on procel for supplementation. No SOB has been reported.  She has PMH of chronic respiratory failure on 2L/min O2, chronic diastolic chf, COPD and A-Fib on Xarelto. She is a long-term resident of [facility].   PAST MEDICAL HISTORY:  Past Medical History  Diagnosis Date  . Arrhythmia   . Arthritis   . Atrial fibrillation ([place])     Remains stable (As of 05/09/13)  . H/O blood clots   . Hyperlipemia   . GERD (gastroesophageal reflux disease)     Stable (As of 05/09/13)  . Osteoarthritis     Denies pain (As of 05/09/13)  . CHF (congestive heart failure) (HCC)     chronic diastolic  . COPD (chronic obstructive pulmonary disease) ([place])     w/exacerbation  . Dementia     Remains stable & continues to function adequately in the current living environment with supervision. Has had little changes in behavior (AS OF 05/09/13)  . Pleural effusion 11/12/2014  . S/P thoracentesis   . Sepsis ([place])   . PNA (pneumonia)   . Acute respiratory failure ([place])   . Metabolic encephalopathy   . UTI (lower urinary tract infection)       CURRENT MEDICATIONS: Reviewed  Patient's Medications  New Prescriptions   No medications on file  Previous Medications   ACETAMINOPHEN (TYLENOL) 500 MG TABLET    Take 500 mg by mouth 3 (three) times daily. Do not exceed 4 gms of Tylenol in 24 hours   ANTISEPTIC ORAL RINSE (CEPACOL ANTIBACTERIAL) 0.05 % LIQD SOLUTION    7 mLs by Mouth Rinse route 2 (two) times daily. At 12N & 4PM   BUDESONIDE-FORMOTEROL (SYMBICORT) 80-4.5 MCG/ACT INHALER    Inhale 2 puffs into the lungs 2 (two) times daily. Wait 1 minute between each puff, rinse mouth after use   CALCIUM CARB-CHOLECALCIFEROL (CALCIUM 600/VITAMIN D3 PO)    Take 1 tablet by mouth daily with breakfast.   DILTIAZEM (CARDIZEM CD) 180 MG 24 HR CAPSULE    Take 1 capsule (180 mg total) by mouth daily. For A-Fib   DOCUSATE SODIUM (DSS) 100 MG CAPS    Take 200 mg by mouth daily. Reported on 06/26/2015   FUROSEMIDE (LASIX) 20 MG TABLET    Take 60 mg by mouth 2 (two) times daily.   FUROSEMIDE (LASIX) 80 MG TABLET    Take 80 mg by mouth.   IPRATROPIUM-ALBUTEROL (DUONEB) 0.5-2.5 (3) MG/3ML SOLN    Take 3 mLs by nebulization every 4 (four) hours as needed. Inhale contents of vial into the mouth via HHN   METOLAZONE (  ZAROXOLYN) 2.5 MG TABLET    Take 1 tablet (2.5 mg total) by mouth 2 (two) times daily.   MULTIPLE VITAMINS-MINERALS (DECUBI-VITE) CAPS    Take 1 capsule by mouth daily.   OLANZAPINE (ZYPREXA) 5 MG TABLET    Take 1 tablet (5 mg total) by mouth at bedtime.   POLYETHYL GLYCOL-PROPYL GLYCOL (SYSTANE OP)    Place 1 drop into both eyes 2 (two) times daily. For dry eyes   POTASSIUM CHLORIDE (KLOR-CON) 20 MEQ PACKET    Take 40 mEq by mouth 2 (two) times daily.   PROTEIN (PROCEL) POWD    Take 1 scoop by mouth 2 (two) times daily.   RIVAROXABAN (XARELTO) 20 MG TABS TABLET    Take 20 mg by mouth daily with supper.   UNABLE TO FIND    Take 120 mLs by mouth 2 (two) times daily. Med Name: MedPass 120 mL po BID for nutritional support  Modified Medications    No medications on file  Discontinued Medications   No medications on file     Allergies  Allergen Reactions  . Chocolate Itching  . Ciprofloxacin Itching  . Coffee Bean Extract [Coffea Arabica] Itching  . Coffee Flavor Itching  . Eggs Or Egg-Derived Products   . Gentamycin [Gentamicin] Itching  . Imodium [Loperamide] Other (See Comments)    unknown  . Penicillins Itching  . Sulfa Antibiotics Itching  . Tea Itching    REVIEW OF SYSTEMS:  GENERAL: no change in appetite, no fatigue, no weight changes, no fever, chills or weakness EYES: Denies change in vision, dry eyes, eye pain, itching or discharge EARS: Denies change in hearing, ringing in ears, or earache NOSE: Denies nasal congestion or epistaxis MOUTH and THROAT: Denies oral discomfort, gingival pain or bleeding, pain from teeth or hoarseness   RESPIRATORY: no cough, SOB, DOE, wheezing, hemoptysis CARDIAC: no chest pain, or palpitations GI: no abdominal pain, diarrhea, constipation, heart burn, nausea or vomiting GU: Denies dysuria, frequency, hematuria or discharge PSYCHIATRIC: Denies feeling of depression or anxiety. No report of hallucinations, insomnia, paranoia, or agitation   PHYSICAL EXAMINATION  GENERAL APPEARANCE: Well nourished. In no acute distress. Normal body habitus HEAD: Normal in size and contour. No evidence of trauma EYES: Lids open and close normally. No blepharitis, entropion or ectropion. PERRL. Conjunctivae are clear and sclerae are white. Lenses are without opacity EARS: Pinnae are normal. Patient hears normal voice tunes of the examiner MOUTH and THROAT: Lips are without lesions. Oral mucosa is moist and without lesions. Tongue is normal in shape, size, and color and without lesions NECK: supple, trachea midline, no neck masses, no thyroid tenderness, no thyromegaly LYMPHATICS: no LAN in the neck, no supraclavicular LAN RESPIRATORY: breathing is even & unlabored, BS CTAB CARDIAC: irregularly  irregular, no murmur,no extra heart sounds, BLE edema 1+ GI: abdomen soft, normal BS, no masses, no tenderness, no hepatomegaly, no splenomegaly MUSCULOSKELETAL: No deformities. Movement at each extremity is full and painless. Strength is 5/5 at each extremity.  PSYCHIATRIC: Alert and oriented X 3. Affect and behavior are appropriate  LABS/RADIOLOGY: Labs reviewed: Basic Metabolic Panel:  Recent Labs  11/16/14 0455  11/18/14 0430  01/23/15 0511  01/25/15 0420 01/27/15 0448 01/28/15 0550  03/25/15 04/02/15 04/07/15  NA 137  < > 139  < > 141  < > 145 141 141  < > 143 140 138  K 3.2*  < > 2.4*  < > 4.7  < > 3.4* 4.3 4.5  < > 3.0*  2.7* 3.1*  CL 70*  < > 67*  < > 90*  < > 84* 78* 72*  --   --   --   --   CO2 >50*  < > >50*  < > 44*  < > >50* >50* >50*  --   --   --   --   GLUCOSE 100*  < > 109*  < > 128*  < > 118* 86 92  --   --   --   --   BUN 30*  < > 32*  < > 39*  < > 22* 18 17  < > 37* 31* 37*  CREATININE 0.48  < > 0.58  < > 0.59  < > 0.37* 0.39* <0.30*  < > 0.7 0.7 0.9  CALCIUM 8.5*  < > 9.0  < > 8.6*  < > 8.3* 8.6* 9.2  --   --   --   --   MG 2.0  --  2.1  --  2.3  --   --   --   --   --   --   --   --   < > = values in this interval not displayed. Liver Function Tests:  Recent Labs  11/12/14 0345 12/28/14 1742 01/20/15 1022  AST 27 32 53*  ALT 25 34 45  ALKPHOS 59 92 99  BILITOT 0.4 0.9 0.9  PROT 6.1* 7.7 6.9  ALBUMIN 2.4* 3.5 3.1*    Recent Labs  01/20/15 1022  LIPASE 15*    Recent Labs  08/11/14 2251 08/19/14 0510  AMMONIA 22 27   CBC:  Recent Labs  12/28/14 1742  01/20/15 1405  01/22/15 0358 01/23/15 0511 01/24/15 0405  WBC 16.2*  < > 8.2  < > 3.9* 6.3 5.6  NEUTROABS 14.0*  --  7.7  --   --  4.6  --   HGB 10.9*  < > 10.2*  < > 8.7* 9.7* 9.2*  HCT 37.4  < > 34.8*  < > 29.3* 34.1* 32.9*  MCV 102.7*  < > 100.0  < > 98.3 98.0 99.1  PLT 441*  < > 275  < > 296 337 281  < > = values in this interval not displayed.  Cardiac Enzymes:  Recent Labs   11/07/14 0845 11/07/14 1713 11/07/14 2302  TROPONINI 0.11* 0.08* 0.06*   CBG:  Recent Labs  01/02/15 0426 01/02/15 0738 01/02/15 1246  GLUCAP 115* 109* 225*     No results found.  ASSESSMENT/PLAN:  Chronic diastolic CHF - continue Lasix 60 mg BID and Zaroxolyn 2.5 mg BID; check CMP   COPD  - continue Symbicort 160-4.5 mcg/ACT 2 puffs into the lungs BID and Duoneb 0.5-2.5 mg/88ml 1 neb  Q 4 hours PRN  A-Fib with RVR - rate-controlled; continue Cardizem 180 mg 24 hr 1 capsule daily and Xarelto 20 mg 1 tab by mouth daily  Hypokalemia - continue KCL 20 meq ER 2 tabs = 40 meq PO BID  Anemia of chronic disease - hgb 9.2; check CBC   Constipation - continue Colace 200 mg daily  Psychosis - continue Zyprexa 5 mg 1 tab PO Q HS  Dementia - advanced; continue supportive care  Protein- calorie malnutrition -  continue Procel 1 scoop by mouth twice a day     Goals of care:  Long-term rehabilitation   [facility], NP [facility] [phone]

## 2015-07-11 LAB — BASIC METABOLIC PANEL
BUN: 55 mg/dL — AB (ref 4–21)
CREATININE: 1.1 mg/dL (ref 0.5–1.1)
Glucose: 102 mg/dL
POTASSIUM: 3.1 mmol/L — AB (ref 3.4–5.3)
Sodium: 138 mmol/L (ref 137–147)

## 2015-07-11 LAB — CBC AND DIFFERENTIAL
HCT: 37 % (ref 36–46)
HEMOGLOBIN: 12.2 g/dL (ref 12.0–16.0)
Neutrophils Absolute: 4 /uL
PLATELETS: 337 10*3/uL (ref 150–399)
WBC: 7.4 10^3/mL

## 2015-07-11 LAB — HEPATIC FUNCTION PANEL
ALK PHOS: 77 U/L (ref 25–125)
ALT: 17 U/L (ref 7–35)
AST: 22 U/L (ref 13–35)
Bilirubin, Total: 0.6 mg/dL

## 2015-07-18 LAB — BASIC METABOLIC PANEL
BUN: 44 mg/dL — AB (ref 4–21)
CREATININE: 0.9 mg/dL (ref 0.5–1.1)
GLUCOSE: 100 mg/dL
POTASSIUM: 2.9 mmol/L — AB (ref 3.4–5.3)
Sodium: 140 mmol/L (ref 137–147)

## 2015-07-21 ENCOUNTER — Non-Acute Institutional Stay (SKILLED_NURSING_FACILITY): Payer: Medicare Other | Admitting: Adult Health

## 2015-07-21 ENCOUNTER — Encounter: Payer: Self-pay | Admitting: Adult Health

## 2015-07-21 DIAGNOSIS — E876 Hypokalemia: Secondary | ICD-10-CM

## 2015-07-21 NOTE — Progress Notes (Signed)
Patient ID: Jennifer Nielsen, female   DOB: 1924-12-10, 82 y.o.   MRN: [id number]    DATE:      07/21/15  MRN:  [id number]  BIRTHDAY: [birth year]  Facility:  Nursing Home Location:  [place] Room Number: [id number]  LEVEL OF CARE:  SNF [phone])  Contact Information    Name Relation Home Work Mobile   Consalvo,Suzanne Daughter [phone]  [phone]   Lanae Boast Daughter [phone]       Chief Complaint  Patient presents with  . Acute Visit    Hypokalemia    HISTORY OF PRESENT ILLNESS:  This is an 82 year old female who is being seen for an acute visit. She has been noted to have K 2.9  . No complaints of constipation nor numbness. She is currently taking Furosemide 80 mg daily for CHF and on K supplementation.  PAST MEDICAL HISTORY:  Past Medical History  Diagnosis Date  . Arrhythmia   . Arthritis   . Atrial fibrillation ([place])     Remains stable (As of 05/09/13)  . H/O blood clots   . Hyperlipemia   . GERD (gastroesophageal reflux disease)     Stable (As of 05/09/13)  . Osteoarthritis     Denies pain (As of 05/09/13)  . CHF (congestive heart failure) (HCC)     chronic diastolic  . COPD (chronic obstructive pulmonary disease) ([place])     w/exacerbation  . Dementia     Remains stable & continues to function adequately in the current living environment with supervision. Has had little changes in behavior (AS OF 05/09/13)  . Pleural effusion 11/12/2014  . S/P thoracentesis   . Sepsis ([place])   . PNA (pneumonia)   . Acute respiratory failure ([place])   . Metabolic encephalopathy   . UTI (lower urinary tract infection)      CURRENT MEDICATIONS: Reviewed  Patient's Medications  New Prescriptions   No medications on file  Previous Medications   ACETAMINOPHEN (TYLENOL) 500 MG TABLET    Take 500 mg by mouth 3 (three) times daily. Do not exceed 4 gms of Tylenol in 24 hours   ANTISEPTIC ORAL RINSE (CEPACOL ANTIBACTERIAL)  0.05 % LIQD SOLUTION    7 mLs by Mouth Rinse route 2 (two) times daily. At 12N & 4PM   BUDESONIDE-FORMOTEROL (SYMBICORT) 80-4.5 MCG/ACT INHALER    Inhale 2 puffs into the lungs 2 (two) times daily. Wait 1 minute between each puff, rinse mouth after use   CALCIUM CARB-CHOLECALCIFEROL (CALCIUM 600/VITAMIN D3 PO)    Take 1 tablet by mouth daily with breakfast.   DILTIAZEM (CARDIZEM CD) 180 MG 24 HR CAPSULE    Take 1 capsule (180 mg total) by mouth daily. For A-Fib   DOCUSATE SODIUM (DSS) 100 MG CAPS    Take 200 mg by mouth daily. Reported on 06/26/2015   FUROSEMIDE (LASIX) 20 MG TABLET    Take 60 mg by mouth 2 (two) times daily.   IPRATROPIUM-ALBUTEROL (DUONEB) 0.5-2.5 (3) MG/3ML SOLN    Take 3 mLs by nebulization every 4 (four) hours as needed. Inhale contents of vial into the mouth via HHN   METOLAZONE (ZAROXOLYN) 2.5 MG TABLET    Take 1 tablet (2.5 mg total) by mouth 2 (two) times daily.   MULTIPLE VITAMINS-MINERALS (DECUBI-VITE) CAPS    Take 1 capsule by mouth daily.   OLANZAPINE (ZYPREXA) 5 MG TABLET    Take 5 mg by mouth. Every other night at bedtime  POLYETHYL GLYCOL-PROPYL GLYCOL (SYSTANE OP)    Place 1 drop into both eyes 2 (two) times daily. For dry eyes   POTASSIUM CHLORIDE (KLOR-CON) 20 MEQ PACKET    Take 40 mEq by mouth 3 (three) times daily.    PROTEIN (PROCEL) POWD    Take 1 scoop by mouth 2 (two) times daily.   RIVAROXABAN (XARELTO) 20 MG TABS TABLET    Take 20 mg by mouth daily with supper.   UNABLE TO FIND    Take 120 mLs by mouth 3 (three) times daily. Med Name: MedPass 120 mL po BID for nutritional support  Modified Medications   No medications on file  Discontinued Medications   FUROSEMIDE (LASIX) 80 MG TABLET    Take 80 mg by mouth.   OLANZAPINE (ZYPREXA) 5 MG TABLET    Take 1 tablet (5 mg total) by mouth at bedtime.     Allergies  Allergen Reactions  . Chocolate Itching  . Ciprofloxacin Itching  . Coffee Bean Extract [Coffea Arabica] Itching  . Coffee Flavor Itching   . Eggs Or Egg-Derived Products   . Gentamycin [Gentamicin] Itching  . Imodium [Loperamide] Other (See Comments)    unknown  . Penicillins Itching  . Sulfa Antibiotics Itching  . Tea Itching    REVIEW OF SYSTEMS:  GENERAL: no change in appetite, no fatigue, no weight changes, no fever, chills or weakness EYES: Denies change in vision, dry eyes, eye pain, itching or discharge EARS: Denies change in hearing, ringing in ears, or earache NOSE: Denies nasal congestion or epistaxis MOUTH and THROAT: Denies oral discomfort, gingival pain or bleeding, pain from teeth or hoarseness   RESPIRATORY: no cough, SOB, DOE, wheezing, hemoptysis CARDIAC: no chest pain, or palpitations GI: no abdominal pain, diarrhea, constipation, heart burn, nausea or vomiting GU: Denies dysuria, frequency, hematuria or discharge PSYCHIATRIC: Denies feeling of depression or anxiety. No report of hallucinations, insomnia, paranoia, or agitation   PHYSICAL EXAMINATION  GENERAL APPEARANCE: Well nourished. In no acute distress. Normal body habitus HEAD: Normal in size and contour. No evidence of trauma EYES: Lids open and close normally. No blepharitis, entropion or ectropion. PERRL. Conjunctivae are clear and sclerae are white. Lenses are without opacity EARS: Pinnae are normal. Patient hears normal voice tunes of the examiner MOUTH and THROAT: Lips are without lesions. Oral mucosa is moist and without lesions. Tongue is normal in shape, size, and color and without lesions NECK: supple, trachea midline, no neck masses, no thyroid tenderness, no thyromegaly LYMPHATICS: no LAN in the neck, no supraclavicular LAN RESPIRATORY: breathing is even & unlabored, BS CTAB CARDIAC: irregularly irregular, no murmur,no extra heart sounds, BLE edema 1+ GI: abdomen soft, normal BS, no masses, no tenderness, no hepatomegaly, no splenomegaly EXTREMITIES:  Able to move X 4 extremities; BLE has generalized weakness PSYCHIATRIC: Alert  and oriented X 3. Affect and behavior are appropriate  LABS/RADIOLOGY: Labs reviewed:  Basic Metabolic Panel:  Recent Labs  11/16/14 0455  11/18/14 0430  01/23/15 0511  01/25/15 0420 01/27/15 0448 01/28/15 0550  04/07/15 07/11/15 07/18/15  NA 137  < > 139  < > 141  < > 145 141 141  < > 138 138 140  K 3.2*  < > 2.4*  < > 4.7  < > 3.4* 4.3 4.5  < > 3.1* 3.1* 2.9*  CL 70*  < > 67*  < > 90*  < > 84* 78* 72*  --   --   --   --  CO2 >50*  < > >50*  < > 44*  < > >50* >50* >50*  --   --   --   --   GLUCOSE 100*  < > 109*  < > 128*  < > 118* 86 92  --   --   --   --   BUN 30*  < > 32*  < > 39*  < > 22* 18 17  < > 37* 55* 44*  CREATININE 0.48  < > 0.58  < > 0.59  < > 0.37* 0.39* <0.30*  < > 0.9 1.1 0.9  CALCIUM 8.5*  < > 9.0  < > 8.6*  < > 8.3* 8.6* 9.2  --   --   --   --   MG 2.0  --  2.1  --  2.3  --   --   --   --   --   --   --   --   < > = values in this interval not displayed. Liver Function Tests:  Recent Labs  11/12/14 0345 12/28/14 1742 01/20/15 1022 07/11/15  AST 27 32 53* 22  ALT 25 34 45 17  ALKPHOS 59 92 99 77  BILITOT 0.4 0.9 0.9  --   PROT 6.1* 7.7 6.9  --   ALBUMIN 2.4* 3.5 3.1*  --     Recent Labs  01/20/15 1022  LIPASE 15*   CBC:  Recent Labs  01/20/15 1405  01/22/15 0358 01/23/15 0511 01/24/15 0405 07/11/15  WBC 8.2  < > 3.9* 6.3 5.6 7.4  NEUTROABS 7.7  --   --  4.6  --  4  HGB 10.2*  < > 8.7* 9.7* 9.2* 12.2  HCT 34.8*  < > 29.3* 34.1* 32.9* 37  MCV 100.0  < > 98.3 98.0 99.1  --   PLT 275  < > 296 337 281 337  < > = values in this interval not displayed.  Cardiac Enzymes:  Recent Labs  11/07/14 0845 11/07/14 1713 11/07/14 2302  TROPONINI 0.11* 0.08* 0.06*   CBG:  Recent Labs  01/02/15 0426 01/02/15 0738 01/02/15 1246  GLUCAP 115* 109* 225*       ASSESSMENT/PLAN:  Hypokalemia - K 2.9; increase KCL ER 20 meq 2 tabs = 40 meq PO TID; BMP on 07/25/15   [facility], NP [place]

## 2015-07-25 LAB — BASIC METABOLIC PANEL
BUN: 39 mg/dL — AB (ref 4–21)
Creatinine: 1 mg/dL (ref 0.5–1.1)
Glucose: 94 mg/dL
Potassium: 3.4 mmol/L (ref 3.4–5.3)
Sodium: 143 mmol/L (ref 137–147)

## 2015-08-20 ENCOUNTER — Encounter: Payer: Self-pay | Admitting: Internal Medicine

## 2015-08-20 ENCOUNTER — Non-Acute Institutional Stay (SKILLED_NURSING_FACILITY): Payer: Medicare Other | Admitting: Internal Medicine

## 2015-08-20 DIAGNOSIS — H04123 Dry eye syndrome of bilateral lacrimal glands: Secondary | ICD-10-CM | POA: Diagnosis not present

## 2015-08-20 DIAGNOSIS — D638 Anemia in other chronic diseases classified elsewhere: Secondary | ICD-10-CM

## 2015-08-20 DIAGNOSIS — Z7901 Long term (current) use of anticoagulants: Secondary | ICD-10-CM

## 2015-08-20 DIAGNOSIS — I4891 Unspecified atrial fibrillation: Secondary | ICD-10-CM

## 2015-08-20 DIAGNOSIS — R635 Abnormal weight gain: Secondary | ICD-10-CM

## 2015-08-20 DIAGNOSIS — J449 Chronic obstructive pulmonary disease, unspecified: Secondary | ICD-10-CM

## 2015-08-20 DIAGNOSIS — I5032 Chronic diastolic (congestive) heart failure: Secondary | ICD-10-CM

## 2015-08-20 DIAGNOSIS — H3530 Unspecified macular degeneration: Secondary | ICD-10-CM | POA: Diagnosis not present

## 2015-08-20 DIAGNOSIS — K5909 Other constipation: Secondary | ICD-10-CM

## 2015-08-20 DIAGNOSIS — K5900 Constipation, unspecified: Secondary | ICD-10-CM | POA: Diagnosis not present

## 2015-08-20 NOTE — Progress Notes (Signed)
Patient ID: Jennifer Nielsen, female   DOB: Jun 20, 1924, 82 y.o.   MRN: [id number]      [place]  PCP: [name], MD  Code Status: DNR  Allergies  Allergen Reactions  . Chocolate Itching  . Ciprofloxacin Itching  . Coffee Bean Extract [Coffea Arabica] Itching  . Coffee Flavor Itching  . Eggs Or Egg-Derived Products   . Gentamycin [Gentamicin] Itching  . Imodium [Loperamide] Other (See Comments)    unknown  . Penicillins Itching  . Sulfa Antibiotics Itching  . Tea Itching    Chief Complaint  Patient presents with  . Medical Management of Chronic Issues    Routine Visit     HPI:  82 y.o. patient is seen for routine visit. Her breathing has been stable. She is now off o2. She has history of COPD and CHF. She has been at her baseline and denies any concerns. No new concern from staff.   Review of Systems:  Constitutional: Negative for fever, chills, diaphoresis.  HENT: Negative for headache, congestion Eyes: Negative for blurred vision, double vision and discharge.  Respiratory: Negative for cough, shortness of breath and wheezing.  Cardiovascular: Negative for chest pain, palpitations, leg swelling.  Gastrointestinal: Negative for heartburn, nausea, vomiting, abdominal pain. Had a bowel movement yesterday Genitourinary: Negative for dysuria Musculoskeletal: Negative for back pain, falls in facility.  Skin: Negative for itching, rash.  Neurological: Negative for dizziness, tingling Psychiatric/Behavioral: Negative for depression    Past Medical History  Diagnosis Date  . Arrhythmia   . Arthritis   . Atrial fibrillation ([place])     Remains stable (As of 05/09/13)  . H/O blood clots   . Hyperlipemia   . GERD (gastroesophageal reflux disease)     Stable (As of 05/09/13)  . Osteoarthritis     Denies pain (As of 05/09/13)  . CHF (congestive heart failure) (HCC)     chronic diastolic  . COPD (chronic obstructive pulmonary disease) ([place])    w/exacerbation  . Dementia     Remains stable & continues to function adequately in the current living environment with supervision. Has had little changes in behavior (AS OF 05/09/13)  . Pleural effusion 11/12/2014  . S/P thoracentesis   . Sepsis ([place])   . PNA (pneumonia)   . Acute respiratory failure ([place])   . Metabolic encephalopathy   . UTI (lower urinary tract infection)    Past Surgical History  Procedure Laterality Date  . Appendectomy    . Gallbladder surgery    . Hip surgery      Right-Metal plate   . Leg surgery      Left leg   Social History:   reports that she has quit smoking. She has never used smokeless tobacco. She reports that she does not drink alcohol or use illicit drugs.  Family History  Problem Relation Age of Onset  . Colon cancer Neg Hx   . Heart disease Father   . Breast cancer Daughter     Medications:   Medication List       This list is accurate as of: 08/20/15  4:13 PM.  Always use your most recent med list.               acetaminophen 500 MG tablet  Commonly known as:  TYLENOL  Take 500 mg by mouth 3 (three) times daily. Do not exceed 4 gms of Tylenol in 24 hours     budesonide-formoterol 80-4.5 MCG/ACT inhaler  Commonly known  as:  SYMBICORT  Inhale 2 puffs into the lungs 2 (two) times daily. Wait 1 minute between each puff, rinse mouth after use     CALCIUM 600/VITAMIN D3 PO  Take 1 tablet by mouth daily with breakfast.     CEPACOL ANTIBACTERIAL 0.05 % Liqd solution  Generic drug:  antiseptic oral rinse  7 mLs by Mouth Rinse route 2 (two) times daily. At 12N & 4PM     DECUBI-VITE Caps  Take 1 capsule by mouth daily.     diltiazem 180 MG 24 hr capsule  Commonly known as:  CARDIZEM CD  Take 1 capsule (180 mg total) by mouth daily. For A-Fib     DSS 100 MG Caps  Take 200 mg by mouth daily. Reported on 06/26/2015     furosemide 80 MG tablet  Commonly known as:  LASIX  Take 80 mg by mouth daily.     furosemide 20 MG tablet    Commonly known as:  LASIX  Take 20 mg by mouth 2 (two) times daily. Take 3 tablets= 60 mg     ipratropium-albuterol 0.5-2.5 (3) MG/3ML Soln  Commonly known as:  DUONEB  Take 3 mLs by nebulization every 4 (four) hours as needed. Inhale contents of vial into the mouth via HHN     metolazone 2.5 MG tablet  Commonly known as:  ZAROXOLYN  Take 1 tablet (2.5 mg total) by mouth 2 (two) times daily.     OLANZapine 5 MG tablet  Commonly known as:  ZYPREXA  Take 5 mg by mouth. Every other night at bedtime     potassium chloride 20 MEQ packet  Commonly known as:  KLOR-CON  Take 40 mEq by mouth 3 (three) times daily.     PROCEL Powd  Take 1 scoop by mouth 2 (two) times daily.     rivaroxaban 20 MG Tabs tablet  Commonly known as:  XARELTO  Take 20 mg by mouth daily with supper.     SYSTANE OP  Place 1 drop into both eyes 2 (two) times daily. For dry eyes     UNABLE TO FIND  Take 120 mLs by mouth 3 (three) times daily. Med Name: MedPass 120 mL po BID for nutritional support         Physical Exam: Filed Vitals:   08/20/15 1557  BP: 124/70  Pulse: 68  Temp: 97.5 F (36.4 C)  TempSrc: Oral  Resp: 18  Height: 5\' 4"  (1.626 m)  Weight: 178 lb 9.6 oz (81.012 kg)   General- elderly female, obese, chronically ill appearing, in no acute distress Head- normocephalic, atraumatic Throat- moist mucus membrane Eyes- no pallor, no icterus, no discharge, normal conjunctiva, normal sclera Neck- no cervical lymphadenopathy Cardiovascular- normal s1,s2, no murmurs, no leg edema Respiratory- CTAB, no wheeze, no rhonchi, no crackles, no use of accessory muscles Abdomen- bowel sounds present, soft, non tender Musculoskeletal- generalized weakness in lower extremities, on wheelchair, kyphosis + Neurological- alert and oriented to person, place and time Skin- warm and dry Psychiatry- calm with normal affect   Labs reviewed: Basic Metabolic Panel:  Recent Labs  11/16/14 0455   11/18/14 0430  01/23/15 0511  01/25/15 0420 01/27/15 0448 01/28/15 0550  07/11/15 07/18/15 07/25/15  NA 137  < > 139  < > 141  < > 145 141 141  < > 138 140 143  K 3.2*  < > 2.4*  < > 4.7  < > 3.4* 4.3 4.5  < > 3.1* 2.9*  3.4  CL 70*  < > 67*  < > 90*  < > 84* 78* 72*  --   --   --   --   CO2 >50*  < > >50*  < > 44*  < > >50* >50* >50*  --   --   --   --   GLUCOSE 100*  < > 109*  < > 128*  < > 118* 86 92  --   --   --   --   BUN 30*  < > 32*  < > 39*  < > 22* 18 17  < > 55* 44* 39*  CREATININE 0.48  < > 0.58  < > 0.59  < > 0.37* 0.39* <0.30*  < > 1.1 0.9 1.0  CALCIUM 8.5*  < > 9.0  < > 8.6*  < > 8.3* 8.6* 9.2  --   --   --   --   MG 2.0  --  2.1  --  2.3  --   --   --   --   --   --   --   --   < > = values in this interval not displayed. Liver Function Tests:  Recent Labs  11/12/14 0345 12/28/14 1742 01/20/15 1022 07/11/15  AST 27 32 53* 22  ALT 25 34 45 17  ALKPHOS 59 92 99 77  BILITOT 0.4 0.9 0.9  --   PROT 6.1* 7.7 6.9  --   ALBUMIN 2.4* 3.5 3.1*  --     Recent Labs  01/20/15 1022  LIPASE 15*   No results for input(s): AMMONIA in the last 8760 hours. CBC:  Recent Labs  01/20/15 1405  01/22/15 0358 01/23/15 0511 01/24/15 0405 07/11/15  WBC 8.2  < > 3.9* 6.3 5.6 7.4  NEUTROABS 7.7  --   --  4.6  --  4  HGB 10.2*  < > 8.7* 9.7* 9.2* 12.2  HCT 34.8*  < > 29.3* 34.1* 32.9* 37  MCV 100.0  < > 98.3 98.0 99.1  --   PLT 275  < > 296 337 281 337  < > = values in this interval not displayed.    Assessment/Plan  Chronic diastolic CHF Wt Readings from Last 3 Encounters:  08/20/15 178 lb 9.6 oz (81.012 kg)  07/21/15 175 lb (79.379 kg)  07/10/15 170 lb 4 oz (77.225 kg)  Monitor breathing and weight. continue Lasix 60 mg bid and discontinue her lasix 80 mg daily prn for now. Continue zaroxolyn 2.5 mg bid. Check bmp. Check weight three times a week for now  COPD No recent exacerbation. Breathing has been stable. Continue duoneb and symbicort.   Weight gain With  obesity, check a1c and lipid panel to evaluate further  Chronic atrial fibrillation Rate controlled. continue Cardizem 180 mg daily and Xarelto 20 mg daily   Long term anticoagulation Continue xarelto and monitor for fall and bleed  Constipation Continue docusate 200 mg daily  Macular degeneration On eye exam 07/09/15. Monitor clinically and do yearly eye exam  Dry eyes Continue systane eye drop  Anemia of chronic disease Stable and improved Hb, Monitor cbc    Goals of care: long term care   Labs/tests ordered: bmp, a1c, lipid panel  Family/ staff Communication: reviewed care plan with patient and nursing supervisor    Blanchie Serve, MD  [facility] [phone] (Monday-Friday 8 am - 5 pm) [phone] (afterhours)

## 2015-08-21 LAB — LIPID PANEL
CHOLESTEROL: 202 mg/dL — AB (ref 0–200)
HDL: 36 mg/dL (ref 35–70)
LDL Cholesterol: 143 mg/dL
TRIGLYCERIDES: 113 mg/dL (ref 40–160)

## 2015-08-21 LAB — BASIC METABOLIC PANEL
BUN: 37 mg/dL — AB (ref 4–21)
CREATININE: 0.9 mg/dL (ref 0.5–1.1)
Glucose: 94 mg/dL
POTASSIUM: 2.9 mmol/L — AB (ref 3.4–5.3)
SODIUM: 141 mmol/L (ref 137–147)

## 2015-08-21 LAB — HEMOGLOBIN A1C: HEMOGLOBIN A1C: 5.8

## 2015-08-22 ENCOUNTER — Non-Acute Institutional Stay (SKILLED_NURSING_FACILITY): Payer: Medicare Other | Admitting: Adult Health

## 2015-08-22 ENCOUNTER — Encounter: Payer: Self-pay | Admitting: Adult Health

## 2015-08-22 DIAGNOSIS — E876 Hypokalemia: Secondary | ICD-10-CM

## 2015-08-22 NOTE — Progress Notes (Signed)
Patient ID: Jennifer Nielsen, female   DOB: September 29, 1924, 82 y.o.   MRN: [id number]    DATE:      08/22/15  MRN:  [id number]  BIRTHDAY: 29-Sep-1924  Facility:  Nursing Home Location:  [place] Room Number: [id number]  LEVEL OF CARE:  SNF [phone])  Contact Information    Name Relation Home Work Mobile   Millican,Suzanne Daughter [phone]  [phone]   Lanae Boast Daughter [phone]       Chief Complaint  Patient presents with  . Acute Visit    Hypokalemia    HISTORY OF PRESENT ILLNESS:  This is an 82 year old female who is being seen for an acute visit. She has been noted to have K 2.9  . No complaints of constipation nor numbness. She is currently taking Furosemide 80 mg daily for CHF and on K supplementation.  PAST MEDICAL HISTORY:  Past Medical History  Diagnosis Date  . Arrhythmia   . Arthritis   . Atrial fibrillation ([place])     Remains stable (As of 05/09/13)  . H/O blood clots   . Hyperlipemia   . GERD (gastroesophageal reflux disease)     Stable (As of 05/09/13)  . Osteoarthritis     Denies pain (As of 05/09/13)  . CHF (congestive heart failure) (HCC)     chronic diastolic  . COPD (chronic obstructive pulmonary disease) ([place])     w/exacerbation  . Dementia     Remains stable & continues to function adequately in the current living environment with supervision. Has had little changes in behavior (AS OF 05/09/13)  . Pleural effusion 11/12/2014  . S/P thoracentesis   . Sepsis ([place])   . PNA (pneumonia)   . Acute respiratory failure ([place])   . Metabolic encephalopathy   . UTI (lower urinary tract infection)      CURRENT MEDICATIONS: Reviewed  Patient's Medications  New Prescriptions   No medications on file  Previous Medications   ACETAMINOPHEN (TYLENOL) 500 MG TABLET    Take 500 mg by mouth 3 (three) times daily. Do not exceed 4 gms of Tylenol in 24 hours   ANTISEPTIC ORAL RINSE (CEPACOL ANTIBACTERIAL)  0.05 % LIQD SOLUTION    7 mLs by Mouth Rinse route 2 (two) times daily. At 12N & 4PM   CALCIUM CARB-CHOLECALCIFEROL (CALCIUM 600/VITAMIN D3 PO)    Take 1 tablet by mouth daily with breakfast.   DILTIAZEM (CARDIZEM CD) 180 MG 24 HR CAPSULE    Take 1 capsule (180 mg total) by mouth daily. For A-Fib   DOCUSATE SODIUM (DSS) 100 MG CAPS    Take 200 mg by mouth daily. Reported on 06/26/2015   FLUTICASONE-SALMETEROL (ADVAIR HFA) 45-21 MCG/ACT INHALER    Inhale 2 puffs into the lungs 2 (two) times daily.   FUROSEMIDE (LASIX) 20 MG TABLET    Take 60 mg by mouth 2 (two) times daily. Take 3 tablets= 60 mg   IPRATROPIUM-ALBUTEROL (DUONEB) 0.5-2.5 (3) MG/3ML SOLN    Take 3 mLs by nebulization every 4 (four) hours as needed. Inhale contents of vial into the mouth via HHN   METOLAZONE (ZAROXOLYN) 2.5 MG TABLET    Take 1 tablet (2.5 mg total) by mouth 2 (two) times daily.   MULTIPLE VITAMINS-MINERALS (DECUBI-VITE) CAPS    Take 1 capsule by mouth daily.   OLANZAPINE (ZYPREXA) 5 MG TABLET    Take 5 mg by mouth. Every other night at bedtime   POLYETHYL GLYCOL-PROPYL GLYCOL (SYSTANE  OP)    Place 1 drop into both eyes 2 (two) times daily. For dry eyes   POTASSIUM CHLORIDE (KLOR-CON) 20 MEQ PACKET    Take 40 mEq by mouth 4 (four) times daily.    PROTEIN (PROCEL) POWD    Take 1 scoop by mouth 2 (two) times daily.   RIVAROXABAN (XARELTO) 20 MG TABS TABLET    Take 20 mg by mouth daily with supper.   UNABLE TO FIND    Take 120 mLs by mouth 3 (three) times daily. Med Name: MedPass 120 mL po TID for nutritional support  Modified Medications   No medications on file  Discontinued Medications   BUDESONIDE-FORMOTEROL (SYMBICORT) 80-4.5 MCG/ACT INHALER    Inhale 2 puffs into the lungs 2 (two) times daily. Wait 1 minute between each puff, rinse mouth after use   FUROSEMIDE (LASIX) 80 MG TABLET    Take 80 mg by mouth daily.     Allergies  Allergen Reactions  . Chocolate Itching  . Ciprofloxacin Itching  . Coffee Bean  Extract [Coffea Arabica] Itching  . Coffee Flavor Itching  . Eggs Or Egg-Derived Products   . Gentamycin [Gentamicin] Itching  . Imodium [Loperamide] Other (See Comments)    unknown  . Penicillins Itching  . Sulfa Antibiotics Itching  . Tea Itching    REVIEW OF SYSTEMS:  GENERAL: no change in appetite, no fatigue, no weight changes, no fever, chills or weakness EYES: Denies change in vision, dry eyes, eye pain, itching or discharge EARS: Denies change in hearing, ringing in ears, or earache NOSE: Denies nasal congestion or epistaxis MOUTH and THROAT: Denies oral discomfort, gingival pain or bleeding, pain from teeth or hoarseness   RESPIRATORY: no cough, SOB, DOE, wheezing, hemoptysis CARDIAC: no chest pain, or palpitations GI: no abdominal pain, diarrhea, constipation, heart burn, nausea or vomiting GU: Denies dysuria, frequency, hematuria or discharge PSYCHIATRIC: Denies feeling of depression or anxiety. No report of hallucinations, insomnia, paranoia, or agitation   PHYSICAL EXAMINATION  GENERAL APPEARANCE: Well nourished. In no acute distress. Normal body habitus HEAD: Normal in size and contour. No evidence of trauma EYES: Lids open and close normally. No blepharitis, entropion or ectropion. PERRL. Conjunctivae are clear and sclerae are white. Lenses are without opacity EARS: Pinnae are normal. Patient hears normal voice tunes of the examiner MOUTH and THROAT: Lips are without lesions. Oral mucosa is moist and without lesions. Tongue is normal in shape, size, and color and without lesions NECK: supple, trachea midline, no neck masses, no thyroid tenderness, no thyromegaly LYMPHATICS: no LAN in the neck, no supraclavicular LAN RESPIRATORY: breathing is even & unlabored, BS CTAB CARDIAC: irregularly irregular, no murmur,no extra heart sounds, BLE edema 1+ GI: abdomen soft, normal BS, no masses, no tenderness, no hepatomegaly, no splenomegaly EXTREMITIES:  Able to move X 4  extremities; BLE has generalized weakness PSYCHIATRIC: Alert and oriented X 3. Affect and behavior are appropriate  LABS/RADIOLOGY: Labs reviewed:  Basic Metabolic Panel:  Recent Labs  11/16/14 0455  11/18/14 0430  01/23/15 0511  01/25/15 0420 01/27/15 0448 01/28/15 0550  07/18/15 07/25/15 08/21/15  NA 137  < > 139  < > 141  < > 145 141 141  < > 140 143 141  K 3.2*  < > 2.4*  < > 4.7  < > 3.4* 4.3 4.5  < > 2.9* 3.4 2.9*  CL 70*  < > 67*  < > 90*  < > 84* 78* 72*  --   --   --   --  CO2 >50*  < > >50*  < > 44*  < > >50* >50* >50*  --   --   --   --   GLUCOSE 100*  < > 109*  < > 128*  < > 118* 86 92  --   --   --   --   BUN 30*  < > 32*  < > 39*  < > 22* 18 17  < > 44* 39* 37*  CREATININE 0.48  < > 0.58  < > 0.59  < > 0.37* 0.39* <0.30*  < > 0.9 1.0 0.9  CALCIUM 8.5*  < > 9.0  < > 8.6*  < > 8.3* 8.6* 9.2  --   --   --   --   MG 2.0  --  2.1  --  2.3  --   --   --   --   --   --   --   --   < > = values in this interval not displayed. Liver Function Tests:  Recent Labs  11/12/14 0345 12/28/14 1742 01/20/15 1022 07/11/15  AST 27 32 53* 22  ALT 25 34 45 17  ALKPHOS 59 92 99 77  BILITOT 0.4 0.9 0.9  --   PROT 6.1* 7.7 6.9  --   ALBUMIN 2.4* 3.5 3.1*  --     Recent Labs  01/20/15 1022  LIPASE 15*   CBC:  Recent Labs  01/20/15 1405  01/22/15 0358 01/23/15 0511 01/24/15 0405 07/11/15  WBC 8.2  < > 3.9* 6.3 5.6 7.4  NEUTROABS 7.7  --   --  4.6  --  4  HGB 10.2*  < > 8.7* 9.7* 9.2* 12.2  HCT 34.8*  < > 29.3* 34.1* 32.9* 37  MCV 100.0  < > 98.3 98.0 99.1  --   PLT 275  < > 296 337 281 337  < > = values in this interval not displayed.  Cardiac Enzymes:  Recent Labs  11/07/14 0845 11/07/14 1713 11/07/14 2302  TROPONINI 0.11* 0.08* 0.06*   CBG:  Recent Labs  01/02/15 0426 01/02/15 0738 01/02/15 1246  GLUCAP 115* 109* 225*       ASSESSMENT/PLAN:  Hypokalemia - K 2.9; increase KCL ER 20 meq 2 tabs = 40 meq PO qID; BMP on  08/25/15   [facility], NP [place]

## 2015-10-03 ENCOUNTER — Non-Acute Institutional Stay (SKILLED_NURSING_FACILITY): Payer: Medicare Other | Admitting: Adult Health

## 2015-10-03 ENCOUNTER — Encounter: Payer: Self-pay | Admitting: Adult Health

## 2015-10-03 DIAGNOSIS — F29 Unspecified psychosis not due to a substance or known physiological condition: Secondary | ICD-10-CM | POA: Diagnosis not present

## 2015-10-03 DIAGNOSIS — F0391 Unspecified dementia with behavioral disturbance: Secondary | ICD-10-CM | POA: Diagnosis not present

## 2015-10-03 DIAGNOSIS — E46 Unspecified protein-calorie malnutrition: Secondary | ICD-10-CM | POA: Diagnosis not present

## 2015-10-03 DIAGNOSIS — J449 Chronic obstructive pulmonary disease, unspecified: Secondary | ICD-10-CM | POA: Diagnosis not present

## 2015-10-03 DIAGNOSIS — E876 Hypokalemia: Secondary | ICD-10-CM | POA: Diagnosis not present

## 2015-10-03 DIAGNOSIS — K5900 Constipation, unspecified: Secondary | ICD-10-CM

## 2015-10-03 DIAGNOSIS — I5032 Chronic diastolic (congestive) heart failure: Secondary | ICD-10-CM | POA: Diagnosis not present

## 2015-10-03 DIAGNOSIS — K5909 Other constipation: Secondary | ICD-10-CM

## 2015-10-03 DIAGNOSIS — I4891 Unspecified atrial fibrillation: Secondary | ICD-10-CM

## 2015-10-03 NOTE — Progress Notes (Signed)
Patient ID: Natalea Krengel, female   DOB: 1924/11/11, 82 y.o.   MRN: [id number]    DATE:  10/03/2015   MRN:  [id number]  BIRTHDAY: Feb 13, 1925  Facility:  Nursing Home Location:  [place] Room Number: [id number]  LEVEL OF CARE:  [phone]  Contact Information    Name Relation Home Work Mobile   Bronk,Suzanne Daughter [phone]  [phone]   Lanae Boast Daughter [phone]       Chief Complaint  Patient presents with  . Medical Management of Chronic Issues    HISTORY OF PRESENT ILLNESS:  This is an 82 year old female who is being seen for a routine visit. No SOB has been notes. She has not been using O2. Latest K 3.1. No complaints of numbness. She was recently treated for hypokalemia. She is currently on potassium supplement and Lasix routinely. No edema has been noted.                                                 She has PMH of chronic respiratory failure on 2L/min O2, chronic diastolic chf, COPD and A-Fib on Xarelto. She is a long-term resident of [facility].   PAST MEDICAL HISTORY:  Past Medical History  Diagnosis Date  . Arrhythmia   . Arthritis   . Atrial fibrillation ([place])     Remains stable (As of 05/09/13)  . H/O blood clots   . Hyperlipemia   . GERD (gastroesophageal reflux disease)     Stable (As of 05/09/13)  . Osteoarthritis     Denies pain (As of 05/09/13)  . CHF (congestive heart failure) (HCC)     chronic diastolic  . COPD (chronic obstructive pulmonary disease) ([place])     w/exacerbation  . Dementia     Remains stable & continues to function adequately in the current living environment with supervision. Has had little changes in behavior (AS OF 05/09/13)  . Pleural effusion 11/12/2014  . S/P thoracentesis   . Sepsis ([place])   . PNA (pneumonia)   . Acute respiratory failure ([place])   . Metabolic encephalopathy   . UTI (lower urinary tract infection)      CURRENT MEDICATIONS: Reviewed   Patient's Medications  New Prescriptions   No medications on file  Previous Medications   ACETAMINOPHEN (TYLENOL) 500 MG TABLET    Take 500 mg by mouth 3 (three) times daily. Do not exceed 4 gms of Tylenol in 24 hours   ANTISEPTIC ORAL RINSE (CEPACOL ANTIBACTERIAL) 0.05 % LIQD SOLUTION    7 mLs by Mouth Rinse route 2 (two) times daily. At 12N & 4PM   CALCIUM CARB-CHOLECALCIFEROL (CALCIUM 600/VITAMIN D3 PO)    Take 1 tablet by mouth daily with breakfast.   DILTIAZEM (CARDIZEM CD) 180 MG 24 HR CAPSULE    Take 1 capsule (180 mg total) by mouth daily. For A-Fib   DOCUSATE SODIUM (DSS) 100 MG CAPS    Take 200 mg by mouth daily. Reported on 06/26/2015   FLUTICASONE-SALMETEROL (ADVAIR HFA) 45-21 MCG/ACT INHALER    Inhale 2 puffs into the lungs 2 (two) times daily.   FUROSEMIDE (LASIX) 20 MG TABLET    Take 60 mg by mouth 2 (two) times daily. Take 3 tablets= 60 mg   IPRATROPIUM-ALBUTEROL (DUONEB) 0.5-2.5 (3) MG/3ML SOLN    Take 3 mLs by  nebulization every 4 (four) hours as needed. Inhale contents of vial into the mouth via HHN   METOLAZONE (ZAROXOLYN) 2.5 MG TABLET    Take 1 tablet (2.5 mg total) by mouth 2 (two) times daily.   MULTIPLE VITAMINS-MINERALS (DECUBI-VITE) CAPS    Take 1 capsule by mouth daily.   OLANZAPINE (ZYPREXA) 5 MG TABLET    Take 5 mg by mouth. Every other night at bedtime   POLYETHYL GLYCOL-PROPYL GLYCOL (SYSTANE OP)    Place 1 drop into both eyes 2 (two) times daily. For dry eyes   POTASSIUM CHLORIDE (KLOR-CON) 20 MEQ PACKET    Take 40 mEq by mouth 4 (four) times daily.    PROTEIN (PROCEL) POWD    Take 1 scoop by mouth 2 (two) times daily.   RIVAROXABAN (XARELTO) 20 MG TABS TABLET    Take 20 mg by mouth daily with supper.   UNABLE TO FIND    Take 120 mLs by mouth 3 (three) times daily. Med Name: MedPass 120 mL po TID for nutritional support  Modified Medications   No medications on file  Discontinued Medications   No medications on file     Allergies  Allergen Reactions  .  Chocolate Itching  . Ciprofloxacin Itching  . Coffee Bean Extract [Coffea Arabica] Itching  . Coffee Flavor Itching  . Eggs Or Egg-Derived Products   . Gentamycin [Gentamicin] Itching  . Imodium [Loperamide] Other (See Comments)    unknown  . Penicillins Itching  . Sulfa Antibiotics Itching  . Tea Itching    REVIEW OF SYSTEMS:  GENERAL: no change in appetite, no fatigue, no weight changes, no fever, chills or weakness EYES: Denies change in vision, dry eyes, eye pain, itching or discharge EARS: Denies change in hearing, ringing in ears, or earache NOSE: Denies nasal congestion or epistaxis MOUTH and THROAT: Denies oral discomfort, gingival pain or bleeding, pain from teeth or hoarseness   RESPIRATORY: no cough, SOB, DOE, wheezing, hemoptysis CARDIAC: no chest pain, or palpitations GI: no abdominal pain, diarrhea, constipation, heart burn, nausea or vomiting GU: Denies dysuria, frequency, hematuria or discharge PSYCHIATRIC: Denies feeling of depression or anxiety. No report of hallucinations, insomnia, paranoia, or agitation   PHYSICAL EXAMINATION  GENERAL APPEARANCE: Well nourished. In no acute distress. Normal body habitus HEAD: Normal in size and contour. No evidence of trauma EYES: Lids open and close normally. No blepharitis, entropion or ectropion. PERRL. Conjunctivae are clear and sclerae are white. Lenses are without opacity EARS: Pinnae are normal. Patient hears normal voice tunes of the examiner MOUTH and THROAT: Lips are without lesions. Oral mucosa is moist and without lesions. Tongue is normal in shape, size, and color and without lesions NECK: supple, trachea midline, no neck masses, no thyroid tenderness, no thyromegaly LYMPHATICS: no LAN in the neck, no supraclavicular LAN RESPIRATORY: breathing is even & unlabored, BS CTAB CARDIAC: irregularly irregular, no murmur,no extra heart sounds GI: abdomen soft, normal BS, no masses, no tenderness, no hepatomegaly, no  splenomegaly MUSCULOSKELETAL: No deformities. Movement at each extremity is full and painless. Strength is 5/5 at each extremity.  PSYCHIATRIC: Alert and oriented X 3. Affect and behavior are appropriate  LABS/RADIOLOGY: Labs reviewed: Basic Metabolic Panel: [id number]  sodium 140 K3.1  glucose 84 BUN 38 creatinine 1.01 calcium 8.4 08/21/15  hemoglobin A1c 5.8  Recent Labs  11/16/14 0455  11/18/14 0430  01/23/15 0511  01/25/15 0420 01/27/15 0448 01/28/15 0550  07/18/15 07/25/15 08/21/15  NA 137  < > 139  < >  141  < > 145 141 141  < > 140 143 141  K 3.2*  < > 2.4*  < > 4.7  < > 3.4* 4.3 4.5  < > 2.9* 3.4 2.9*  CL 70*  < > 67*  < > 90*  < > 84* 78* 72*  --   --   --   --   CO2 >50*  < > >50*  < > 44*  < > >50* >50* >50*  --   --   --   --   GLUCOSE 100*  < > 109*  < > 128*  < > 118* 86 92  --   --   --   --   BUN 30*  < > 32*  < > 39*  < > 22* 18 17  < > 44* 39* 37*  CREATININE 0.48  < > 0.58  < > 0.59  < > 0.37* 0.39* <0.30*  < > 0.9 1.0 0.9  CALCIUM 8.5*  < > 9.0  < > 8.6*  < > 8.3* 8.6* 9.2  --   --   --   --   MG 2.0  --  2.1  --  2.3  --   --   --   --   --   --   --   --   < > = values in this interval not displayed. Liver Function Tests:  Recent Labs  11/12/14 0345 12/28/14 1742 01/20/15 1022 07/11/15  AST 27 32 53* 22  ALT 25 34 45 17  ALKPHOS 59 92 99 77  BILITOT 0.4 0.9 0.9  --   PROT 6.1* 7.7 6.9  --   ALBUMIN 2.4* 3.5 3.1*  --     Recent Labs  01/20/15 1022  LIPASE 15*   CBC:  Recent Labs  01/20/15 1405  01/22/15 0358 01/23/15 0511 01/24/15 0405 07/11/15  WBC 8.2  < > 3.9* 6.3 5.6 7.4  NEUTROABS 7.7  --   --  4.6  --  4  HGB 10.2*  < > 8.7* 9.7* 9.2* 12.2  HCT 34.8*  < > 29.3* 34.1* 32.9* 37  MCV 100.0  < > 98.3 98.0 99.1  --   PLT 275  < > 296 337 281 337  < > = values in this interval not displayed.  Cardiac Enzymes:  Recent Labs  11/07/14 0845 11/07/14 1713 11/07/14 2302  TROPONINI 0.11* 0.08* 0.06*   CBG:  Recent Labs   01/02/15 0426 01/02/15 0738 01/02/15 1246  GLUCAP 115* 109* 225*      ASSESSMENT/PLAN:  Chronic diastolic CHF - continue Lasix 60 mg BID and Zaroxolyn 2.5 mg BID   COPD  - continue Symbicort 160-4.5 mcg/ACT 2 puffs into the lungs BID and Duoneb 0.5-2.5 mg/35ml 1 neb  Q 4 hours PRN  A-Fib with RVR - rate-controlled; continue Cardizem 180 mg 24 hr 1 capsule daily and Xarelto 20 mg 1 tab by mouth daily  Hypokalemia - increase KCL ER 20 meq take 2 tabs = 40 meq PO 5X/day; BMP on 11/06/15  Constipation - continue Colace 200 mg daily  Psychosis - continue Zyprexa 5 mg 1 tab PO Q HS  Dementia - advanced; continue supportive care  Protein- calorie malnutrition -  continue Procel 1 scoop by mouth twice a day     Goals of care:  Long-term rehabilitation   [facility], [facility] [phone]

## 2015-10-16 ENCOUNTER — Emergency Department (HOSPITAL_COMMUNITY)
Admission: EM | Admit: 2015-10-16 | Discharge: 2015-10-16 | Disposition: A | Discharge status: Home / Self Care | Payer: Medicare Other | Attending: Emergency Medicine | Admitting: Emergency Medicine

## 2015-10-16 ENCOUNTER — Encounter (HOSPITAL_COMMUNITY): Payer: Self-pay | Admitting: Emergency Medicine

## 2015-10-16 DIAGNOSIS — M1990 Unspecified osteoarthritis, unspecified site: Secondary | ICD-10-CM | POA: Diagnosis not present

## 2015-10-16 DIAGNOSIS — Z7901 Long term (current) use of anticoagulants: Secondary | ICD-10-CM | POA: Insufficient documentation

## 2015-10-16 DIAGNOSIS — F0390 Unspecified dementia without behavioral disturbance: Secondary | ICD-10-CM | POA: Diagnosis not present

## 2015-10-16 DIAGNOSIS — J449 Chronic obstructive pulmonary disease, unspecified: Secondary | ICD-10-CM | POA: Insufficient documentation

## 2015-10-16 DIAGNOSIS — E860 Dehydration: Secondary | ICD-10-CM | POA: Insufficient documentation

## 2015-10-16 DIAGNOSIS — Z7951 Long term (current) use of inhaled steroids: Secondary | ICD-10-CM | POA: Diagnosis not present

## 2015-10-16 DIAGNOSIS — Z8669 Personal history of other diseases of the nervous system and sense organs: Secondary | ICD-10-CM | POA: Insufficient documentation

## 2015-10-16 DIAGNOSIS — Z79899 Other long term (current) drug therapy: Secondary | ICD-10-CM | POA: Diagnosis not present

## 2015-10-16 DIAGNOSIS — Z8701 Personal history of pneumonia (recurrent): Secondary | ICD-10-CM | POA: Diagnosis not present

## 2015-10-16 DIAGNOSIS — R799 Abnormal finding of blood chemistry, unspecified: Secondary | ICD-10-CM | POA: Diagnosis present

## 2015-10-16 DIAGNOSIS — Z87891 Personal history of nicotine dependence: Secondary | ICD-10-CM | POA: Diagnosis not present

## 2015-10-16 DIAGNOSIS — Z87440 Personal history of urinary (tract) infections: Secondary | ICD-10-CM | POA: Insufficient documentation

## 2015-10-16 DIAGNOSIS — Z86718 Personal history of other venous thrombosis and embolism: Secondary | ICD-10-CM | POA: Diagnosis not present

## 2015-10-16 DIAGNOSIS — Z880 Allergy status to penicillin: Secondary | ICD-10-CM | POA: Diagnosis not present

## 2015-10-16 DIAGNOSIS — I4891 Unspecified atrial fibrillation: Secondary | ICD-10-CM | POA: Diagnosis not present

## 2015-10-16 DIAGNOSIS — K219 Gastro-esophageal reflux disease without esophagitis: Secondary | ICD-10-CM | POA: Diagnosis not present

## 2015-10-16 DIAGNOSIS — Z8619 Personal history of other infectious and parasitic diseases: Secondary | ICD-10-CM | POA: Insufficient documentation

## 2015-10-16 DIAGNOSIS — I5032 Chronic diastolic (congestive) heart failure: Secondary | ICD-10-CM | POA: Insufficient documentation

## 2015-10-16 LAB — BASIC METABOLIC PANEL
ANION GAP: 15 (ref 5–15)
BUN: 42 mg/dL — ABNORMAL HIGH (ref 6–20)
CHLORIDE: 91 mmol/L — AB (ref 101–111)
CO2: 32 mmol/L (ref 22–32)
Calcium: 9.8 mg/dL (ref 8.9–10.3)
Creatinine, Ser: 1.04 mg/dL — ABNORMAL HIGH (ref 0.44–1.00)
GFR calc Af Amer: 54 mL/min — ABNORMAL LOW (ref >60)
GFR calc non Af Amer: 46 mL/min — ABNORMAL LOW (ref >60)
GLUCOSE: 129 mg/dL — AB (ref 65–99)
POTASSIUM: 5.1 mmol/L (ref 3.5–5.1)
Sodium: 138 mmol/L (ref 135–145)

## 2015-10-16 NOTE — Discharge Instructions (Signed)
Drink more fluids at home follow-up with their doctor as needed

## 2015-10-16 NOTE — ED Notes (Signed)
[facility] called/[facility] [employer] number

## 2015-10-16 NOTE — ED Notes (Signed)
Patient refusing to change into gown.

## 2015-10-16 NOTE — ED Provider Notes (Signed)
CSN: [id number]     Arrival date & time 10/16/15  1841 History   First MD Initiated Contact with Patient 10/16/15 1926     Chief Complaint  Patient presents with  . Abnormal Lab     (Consider location/radiation/quality/duration/timing/severity/associated sxs/prior Treatment) Patient is a 82 y.o. female presenting with general illness. The history is provided by a relative (Patient sent over here because her CO2 level was too high).  Illness Severity:  Mild Onset quality:  Gradual Timing:  Rare Progression:  Improving Chronicity:  New Associated symptoms: no abdominal pain, no chest pain, no congestion, no cough, no diarrhea, no fatigue, no headaches and no rash     Past Medical History  Diagnosis Date  . Arrhythmia   . Arthritis   . Atrial fibrillation ([place])     Remains stable (As of 05/09/13)  . H/O blood clots   . Hyperlipemia   . GERD (gastroesophageal reflux disease)     Stable (As of 05/09/13)  . Osteoarthritis     Denies pain (As of 05/09/13)  . CHF (congestive heart failure) (HCC)     chronic diastolic  . COPD (chronic obstructive pulmonary disease) ([place])     w/exacerbation  . Dementia     Remains stable & continues to function adequately in the current living environment with supervision. Has had little changes in behavior (AS OF 05/09/13)  . Pleural effusion 11/12/2014  . S/P thoracentesis   . Sepsis ([place])   . PNA (pneumonia)   . Acute respiratory failure ([place])   . Metabolic encephalopathy   . UTI (lower urinary tract infection)    Past Surgical History  Procedure Laterality Date  . Appendectomy    . Gallbladder surgery    . Hip surgery      Right-Metal plate   . Leg surgery      Left leg   Family History  Problem Relation Age of Onset  . Colon cancer Neg Hx   . Heart disease Father   . Breast cancer Daughter    Social History  Substance Use Topics  . Smoking status: Former Research scientist (life sciences)  . Smokeless tobacco: Never Used  . Alcohol Use: No   OB  History    No data available     Review of Systems  Constitutional: Negative for appetite change and fatigue.  HENT: Negative for congestion, ear discharge and sinus pressure.   Eyes: Negative for discharge.  Respiratory: Negative for cough.   Cardiovascular: Negative for chest pain.  Gastrointestinal: Negative for abdominal pain and diarrhea.  Genitourinary: Negative for frequency and hematuria.  Musculoskeletal: Negative for back pain.  Skin: Negative for rash.  Neurological: Negative for seizures and headaches.  Psychiatric/Behavioral: Negative for hallucinations.      Allergies  Chocolate; Ciprofloxacin; Coffee bean extract; Coffee flavor; Eggs or egg-derived products; Gentamycin; Imodium; Penicillins; Sulfa antibiotics; Sulfacetamide sodium; and Tea  Home Medications   Prior to Admission medications   Medication Sig Start Date End Date Taking? Authorizing Provider  acetaminophen (TYLENOL) 500 MG tablet Take 500 mg by mouth 3 (three) times daily. Do not exceed 4 gms of Tylenol in 24 hours   Yes Historical Provider, MD  antiseptic oral rinse (CEPACOL ANTIBACTERIAL) 0.05 % LIQD solution 7 mLs by Mouth Rinse route 2 (two) times daily. At [place]   Yes Historical Provider, MD  Calcium Carb-Cholecalciferol (CALCIUM 600/VITAMIN D3 PO) Take 1 tablet by mouth daily with breakfast.   Yes Historical Provider, MD  diltiazem (CARDIZEM CD) 180  MG 24 hr capsule Take 1 capsule (180 mg total) by mouth daily. For A-Fib 11/20/14  Yes Velvet Bathe, MD  Docusate Sodium (DSS) 100 MG CAPS Take 100 mg by mouth 2 (two) times daily. Reported on 06/26/2015 07/02/13  Yes Marianne L [place], PA-C  fluticasone-salmeterol (ADVAIR HFA) [phone] MCG/ACT inhaler Inhale 2 puffs into the lungs 2 (two) times daily.   Yes Historical Provider, MD  furosemide (LASIX) 20 MG tablet Take 60 mg by mouth 2 (two) times daily. Take 3 tablets= 60 mg   Yes Historical Provider, MD  ipratropium-albuterol (DUONEB) 0.5-2.5 (3) MG/3ML  SOLN Take 3 mLs by nebulization every 4 (four) hours as needed. Inhale contents of vial into the mouth via HHN   Yes Historical Provider, MD  metolazone (ZAROXOLYN) 2.5 MG tablet Take 1 tablet (2.5 mg total) by mouth 2 (two) times daily. 01/28/15  Yes Venetia Maxon Rama, MD  Multiple Vitamins-Minerals (DECUBI-VITE) CAPS Take 1 capsule by mouth daily.   Yes Historical Provider, MD  OLANZapine (ZYPREXA) 5 MG tablet Take 5 mg by mouth. Every other night at bedtime   Yes Historical Provider, MD  Polyethyl Glycol-Propyl Glycol (SYSTANE OP) Place 1 drop into both eyes 2 (two) times daily. For dry eyes   Yes Historical Provider, MD  potassium chloride (KLOR-CON) 20 MEQ packet Take 40 mEq by mouth 4 (four) times daily.    Yes Historical Provider, MD  Protein (PROCEL) POWD Take 1 scoop by mouth 2 (two) times daily.   Yes Historical Provider, MD  rivaroxaban (XARELTO) 20 MG TABS tablet Take 20 mg by mouth daily with supper.   Yes Historical Provider, MD  tiotropium (SPIRIVA) 18 MCG inhalation capsule Place 18 mcg into inhaler and inhale daily.   Yes Historical Provider, MD  UNABLE TO FIND Take 120 mLs by mouth 3 (three) times daily. Med Name: MedPass 120 mL po TID for nutritional support   Yes Historical Provider, MD   BP 118/74 mmHg  Pulse 114  Temp(Src) 99.2 F (37.3 C) (Oral)  Resp 18  Ht 5\' 7"  (1.702 m)  SpO2 98% Physical Exam  Constitutional: She is oriented to person, place, and time. She appears well-developed.  HENT:  Head: Normocephalic.  Eyes: Conjunctivae and EOM are normal. No scleral icterus.  Neck: Neck supple. No thyromegaly present.  Cardiovascular: Normal rate and regular rhythm.  Exam reveals no gallop and no friction rub.   No murmur heard. Pulmonary/Chest: No stridor. She has no wheezes. She has no rales. She exhibits no tenderness.  Abdominal: She exhibits no distension. There is no tenderness. There is no rebound.  Musculoskeletal: Normal range of motion. She exhibits no edema.   Lymphadenopathy:    She has no cervical adenopathy.  Neurological: She is oriented to person, place, and time. She exhibits normal muscle tone. Coordination normal.  Skin: No rash noted. No erythema.  Psychiatric: She has a normal mood and affect. Her behavior is normal.    ED Course  Procedures (including critical care time) Labs Review Labs Reviewed  BASIC METABOLIC PANEL - Abnormal; Notable for the following:    Chloride 91 (*)    Glucose, Bld 129 (*)    BUN 42 (*)    Creatinine, Ser 1.04 (*)    GFR calc non Af Amer 46 (*)    GFR calc Af Amer 54 (*)    All other components within normal limits    Imaging Review No results found. I have personally reviewed and evaluated these images and  lab results as part of my medical decision-making.   EKG Interpretation None      MDM   Final diagnoses:  Dehydration   Patient was sent from the nursing home for an elevated CO2 level. We checked chemistries on her and her CO2 was normal. She has no complaints. Chemistries do show that she is mildly dehydrated patient was instructed to drink more fluids    Milton Ferguson, MD 10/16/15 2151

## 2015-10-16 NOTE — ED Notes (Signed)
Per EMS, patient's facility called out due to patient's CO2 being elevated at 37.5. Patient denies shortness of breath, congestion, fever. Patient refused oxygen at facility. Patient 97-98% on room air Patient is from [facility].

## 2015-10-16 NOTE — ED Notes (Signed)
Bed: [id number] Expected date:  Expected time:  Means of arrival:  Comments: EMS- 82yo F, elevated CO2

## 2015-10-17 LAB — BASIC METABOLIC PANEL
BUN: 40 mg/dL — AB (ref 4–21)
CREATININE: 1 mg/dL (ref 0.5–1.1)
Glucose: 101 mg/dL
Potassium: 3.8 mmol/L (ref 3.4–5.3)
Sodium: 140 mmol/L (ref 137–147)

## 2015-10-21 LAB — BASIC METABOLIC PANEL
BUN: 40 mg/dL — AB (ref 4–21)
Creatinine: 0.9 mg/dL (ref 0.5–1.1)
Glucose: 98 mg/dL
POTASSIUM: 3 mmol/L — AB (ref 3.4–5.3)
Sodium: 137 mmol/L (ref 137–147)

## 2015-10-24 LAB — BASIC METABOLIC PANEL
BUN: 30 mg/dL — AB (ref 4–21)
Creatinine: 0.8 mg/dL (ref 0.5–1.1)
GLUCOSE: 85 mg/dL
POTASSIUM: 5.2 mmol/L (ref 3.4–5.3)
Sodium: 138 mmol/L (ref 137–147)

## 2015-10-29 ENCOUNTER — Non-Acute Institutional Stay (SKILLED_NURSING_FACILITY): Payer: Medicare Other | Admitting: Adult Health

## 2015-10-29 ENCOUNTER — Encounter: Payer: Self-pay | Admitting: Adult Health

## 2015-10-29 DIAGNOSIS — I5032 Chronic diastolic (congestive) heart failure: Secondary | ICD-10-CM | POA: Diagnosis not present

## 2015-10-29 DIAGNOSIS — F29 Unspecified psychosis not due to a substance or known physiological condition: Secondary | ICD-10-CM

## 2015-10-29 DIAGNOSIS — E46 Unspecified protein-calorie malnutrition: Secondary | ICD-10-CM | POA: Diagnosis not present

## 2015-10-29 DIAGNOSIS — K5900 Constipation, unspecified: Secondary | ICD-10-CM | POA: Diagnosis not present

## 2015-10-29 DIAGNOSIS — I4891 Unspecified atrial fibrillation: Secondary | ICD-10-CM | POA: Diagnosis not present

## 2015-10-29 DIAGNOSIS — F0391 Unspecified dementia with behavioral disturbance: Secondary | ICD-10-CM | POA: Diagnosis not present

## 2015-10-29 DIAGNOSIS — E876 Hypokalemia: Secondary | ICD-10-CM

## 2015-10-29 DIAGNOSIS — K5909 Other constipation: Secondary | ICD-10-CM

## 2015-10-29 DIAGNOSIS — J449 Chronic obstructive pulmonary disease, unspecified: Secondary | ICD-10-CM | POA: Diagnosis not present

## 2015-10-29 NOTE — Progress Notes (Signed)
Patient ID: Jennifer Nielsen, female   DOB: 03-28-1925, 82 y.o.   MRN: [id number]    DATE:    10/29/15  MRN:  [id number]  BIRTHDAY: 1924-09-23  Facility:  Nursing Home Location:  [facility] Room Number: [id number]  LEVEL OF CARE:  SNF [phone])  Contact Information    Name Relation Home Work Mobile   Mruk,Suzanne Daughter [phone]  [phone]   Lanae Boast Daughter [phone]       Chief Complaint  Patient presents with  . Medical Management of Chronic Issues     HISTORY OF PRESENT ILLNESS:  This is an 82 year old female who is being seen for a routine visit. She is a long-term resident of [facility]. Her Zyprexa was recently increased to 5 mg Q HS due to patient having hallucinations of people not present in her room. She was recently sent to ER due to CO2 41.8  . She was sent back after giving her fluids/treated for dehydration.  PAST MEDICAL HISTORY:  Past Medical History  Diagnosis Date  . Arrhythmia   . Arthritis   . Atrial fibrillation ([place])     Remains stable (As of 05/09/13)  . H/O blood clots   . Hyperlipemia   . GERD (gastroesophageal reflux disease)     Stable (As of 05/09/13)  . Osteoarthritis     Denies pain (As of 05/09/13)  . CHF (congestive heart failure) (HCC)     chronic diastolic  . COPD (chronic obstructive pulmonary disease) ([place])     w/exacerbation  . Dementia     Remains stable & continues to function adequately in the current living environment with supervision. Has had little changes in behavior (AS OF 05/09/13)  . Pleural effusion 11/12/2014  . S/P thoracentesis   . Sepsis ([place])   . PNA (pneumonia)   . Acute respiratory failure ([place])   . Metabolic encephalopathy   . UTI (lower urinary tract infection)   . Protein calorie malnutrition ([place])   . Psychosis      CURRENT MEDICATIONS: Reviewed  Patient's Medications  New Prescriptions   No medications on file  Previous  Medications   ACETAMINOPHEN (TYLENOL) 500 MG TABLET    Take 500 mg by mouth 3 (three) times daily. Do not exceed 4 gms of Tylenol in 24 hours   CALCIUM CARB-CHOLECALCIFEROL (CALCIUM 600/VITAMIN D3 PO)    Take 1 tablet by mouth daily with breakfast.   DILTIAZEM (CARDIZEM CD) 180 MG 24 HR CAPSULE    Take 1 capsule (180 mg total) by mouth daily. For A-Fib   DOCUSATE SODIUM (DSS) 100 MG CAPS    Take 200 mg by mouth 2 (two) times daily. Reported on 06/26/2015   FLUTICASONE-SALMETEROL (ADVAIR HFA) 45-21 MCG/ACT INHALER    Inhale 2 puffs into the lungs 2 (two) times daily.   FUROSEMIDE (LASIX) 20 MG TABLET    Take 60 mg by mouth 2 (two) times daily. Take 3 tablets= 60 mg   IPRATROPIUM-ALBUTEROL (DUONEB) 0.5-2.5 (3) MG/3ML SOLN    Take 3 mLs by nebulization every 4 (four) hours as needed. Inhale contents of vial into the mouth via HHN   MAGNESIUM OXIDE (MAG-OX) 400 MG TABLET    Take 400 mg by mouth daily.   METOLAZONE (ZAROXOLYN) 2.5 MG TABLET    Take 1 tablet (2.5 mg total) by mouth 2 (two) times daily.   MULTIPLE VITAMINS-MINERALS (DECUBI-VITE) CAPS    Take 1 capsule by mouth daily.   OLANZAPINE (  ZYPREXA) 5 MG TABLET    Take 5 mg by mouth at bedtime.    POLYETHYL GLYCOL-PROPYL GLYCOL (SYSTANE OP)    Place 1 drop into both eyes 2 (two) times daily. For dry eyes   POTASSIUM CHLORIDE (KLOR-CON) 20 MEQ PACKET    Take 80 mEq by mouth 2 (two) times daily. At 9AM, 5PM   PROTEIN (PROCEL) POWD    Take 1 scoop by mouth 2 (two) times daily.   RIVAROXABAN (XARELTO) 20 MG TABS TABLET    Take 20 mg by mouth daily with supper.   UNABLE TO FIND    Take 120 mLs by mouth 3 (three) times daily. Med Name: MedPass 120 mL po TID for nutritional support  Modified Medications   No medications on file  Discontinued Medications   ANTISEPTIC ORAL RINSE (CEPACOL ANTIBACTERIAL) 0.05 % LIQD SOLUTION    7 mLs by Mouth Rinse route 2 (two) times daily. At 12N & 4PM   TIOTROPIUM (SPIRIVA) 18 MCG INHALATION CAPSULE    Place 18 mcg into  inhaler and inhale daily.     Allergies  Allergen Reactions  . Chocolate Itching  . Ciprofloxacin Itching  . Coffee Bean Extract [Coffea Arabica] Itching  . Coffee Flavor Itching  . Eggs Or Egg-Derived Products   . Gentamycin [Gentamicin] Itching  . Imodium [Loperamide] Other (See Comments)    unknown  . Penicillins Itching  . Sulfa Antibiotics Itching  . Sulfacetamide Sodium Itching  . Tea Itching    REVIEW OF SYSTEMS:  GENERAL: no change in appetite, no fatigue, no weight changes, no fever, chills or weakness EYES: Denies change in vision, dry eyes, eye pain, itching or discharge EARS: Denies change in hearing, ringing in ears, or earache NOSE: Denies nasal congestion or epistaxis MOUTH and THROAT: Denies oral discomfort, gingival pain or bleeding, pain from teeth or hoarseness   RESPIRATORY: no cough, SOB, DOE, wheezing, hemoptysis CARDIAC: no chest pain, or palpitations GI: no abdominal pain, diarrhea, constipation, heart burn, nausea or vomiting GU: Denies dysuria, frequency, hematuria or discharge PSYCHIATRIC: Denies feeling of depression or anxiety. No report of hallucinations, insomnia, paranoia, or agitation   PHYSICAL EXAMINATION  GENERAL APPEARANCE: Well nourished. In no acute distress. Normal body habitus SKIN:  Skin is dry and warm. HEAD: Normal in size and contour. No evidence of trauma EYES: Lids open and close normally. No blepharitis, entropion or ectropion. PERRL. Conjunctivae are clear and sclerae are white. Lenses are without opacity EARS: Pinnae are normal. Patient hears normal voice tunes of the examiner MOUTH and THROAT: Lips are without lesions. Oral mucosa is moist and without lesions. Tongue is normal in shape, size, and color and without lesions NECK: supple, trachea midline, no neck masses, no thyroid tenderness, no thyromegaly LYMPHATICS: no LAN in the neck, no supraclavicular LAN RESPIRATORY: breathing is even & unlabored, BS CTAB CARDIAC:  irregularly irregular, no murmur,no extra heart sounds GI: abdomen soft, normal BS, no masses, no tenderness, no hepatomegaly, no splenomegaly EXTREMITIES:  Able to move X 4 extremities; BLE generalized weakness  PSYCHIATRIC: Alert and oriented X 3. Affect and behavior are appropriate  LABS/RADIOLOGY: Labs reviewed: 08/25/15  sodium 140 K3.1  glucose 84 BUN 38 creatinine 1.01 calcium 8.4 08/21/15  hemoglobin A1c 5.8 Basic Metabolic Panel: Recent Labs  01/23/15 0511  01/27/15 0448 01/28/15 0550  10/16/15 2005      NA 141  < > 141 141  < > 138      K 4.7  < > 4.3  4.5  < > 5.1      CL 90*  < > 78* 72*  --  91*      CO2 44*  < > >50* >50*  --  32      GLUCOSE 128*  < > 86 92  --  129*      BUN 39*  < > 18 17  < > 42*      CREATININE 0.59  < > 0.39* <0.30*  < > 1.04*      CALCIUM 8.6*  < > 8.6* 9.2  --  9.8      MG 2.3  --   --   --   --   --       < > = values in this interval not displayed. Liver Function Tests:  Recent Labs  12/28/14 1742 01/20/15 1022 07/11/15  AST 32 53* 22  ALT 34 45 17  ALKPHOS 92 99 77  BILITOT 0.9 0.9  --   PROT 7.7 6.9  --   ALBUMIN 3.5 3.1*  --     Recent Labs  01/20/15 1022  LIPASE 15*   CBC:  Recent Labs  01/22/15 0358 01/23/15 0511 01/24/15 0405 07/11/15   WBC 3.9* 6.3 5.6 7.4   NEUTROABS  --  4.6  --  4   HGB 8.7* 9.7* 9.2* 12.2   HCT 29.3* 34.1* 32.9* 37   MCV 98.3 98.0 99.1  --    PLT 296 337 281 337    CBG:  Recent Labs  01/02/15 0426 01/02/15 0738 01/02/15 1246  GLUCAP 115* 109* 225*      ASSESSMENT/PLAN:  Chronic diastolic CHF - continue Lasix 60 mg BID and Zaroxolyn 2.5 mg BID   COPD  - continue Symbicort 160-4.5 mcg/ACT 2 puffs into the lungs BID and Duoneb 0.5-2.5 mg/11ml 1 neb  Q 4 hours PRN  A-Fib with RVR - rate-controlled; continue Cardizem 180 mg 24 hr 1 capsule daily and Xarelto 20 mg 1 tab by mouth daily  Hypokalemia - continue KCL ER 20 meq take 4 tabs = 80 meq PO Q 8AM and 12PM  Constipation -  continue Colace 200 mg daily  Psychosis - continue Zyprexa 5 mg 1 tab PO Q HS  Dementia - advanced; continue supportive care  Protein- calorie malnutrition -  continue Procel 1 scoop by mouth twice a day     Goals of care:  Long-term rehabilitation   Durenda Age, NP [facility] [phone]

## 2015-10-30 LAB — BASIC METABOLIC PANEL
BUN: 33 mg/dL — AB (ref 4–21)
CREATININE: 0.8 mg/dL (ref 0.5–1.1)
Glucose: 87 mg/dL
POTASSIUM: 2.9 mmol/L — AB (ref 3.4–5.3)
SODIUM: 138 mmol/L (ref 137–147)

## 2015-11-03 LAB — BASIC METABOLIC PANEL
BUN: 28 mg/dL — AB (ref 4–21)
Creatinine: 0.7 mg/dL (ref 0.5–1.1)
GLUCOSE: 87 mg/dL
Potassium: 3.1 mmol/L — AB (ref 3.4–5.3)
Sodium: 138 mmol/L (ref 137–147)

## 2015-11-07 LAB — BASIC METABOLIC PANEL
BUN: 33 mg/dL — AB (ref 4–21)
CREATININE: 0.9 mg/dL (ref 0.5–1.1)
Glucose: 84 mg/dL
Potassium: 5.1 mmol/L (ref 3.4–5.3)
SODIUM: 142 mmol/L (ref 137–147)

## 2015-11-18 LAB — BASIC METABOLIC PANEL
BUN: 44 mg/dL — AB (ref 4–21)
CREATININE: 1 mg/dL (ref 0.5–1.1)
GLUCOSE: 102 mg/dL
Potassium: 2.6 mmol/L — AB (ref 3.4–5.3)
Sodium: 139 mmol/L (ref 137–147)

## 2015-11-19 LAB — BASIC METABOLIC PANEL
BUN: 43 mg/dL — AB (ref 4–21)
Creatinine: 1.1 mg/dL (ref 0.5–1.1)
Glucose: 93 mg/dL
Potassium: 5.2 mmol/L (ref 3.4–5.3)
SODIUM: 142 mmol/L (ref 137–147)

## 2015-11-24 LAB — CBC AND DIFFERENTIAL
HEMATOCRIT: 34 % — AB (ref 36–46)
HEMOGLOBIN: 10.6 g/dL — AB (ref 12.0–16.0)
Neutrophils Absolute: 3 /uL
Platelets: 279 10*3/uL (ref 150–399)
WBC: 6.2 10^3/mL

## 2015-11-24 LAB — BASIC METABOLIC PANEL
BUN: 31 mg/dL — AB (ref 4–21)
Creatinine: 0.9 mg/dL (ref 0.5–1.1)
Glucose: 89 mg/dL
POTASSIUM: 3.1 mmol/L — AB (ref 3.4–5.3)
SODIUM: 142 mmol/L (ref 137–147)

## 2015-11-26 ENCOUNTER — Encounter: Payer: Self-pay | Admitting: Adult Health

## 2015-11-26 ENCOUNTER — Non-Acute Institutional Stay (SKILLED_NURSING_FACILITY): Payer: Medicare Other | Admitting: Adult Health

## 2015-11-26 DIAGNOSIS — F29 Unspecified psychosis not due to a substance or known physiological condition: Secondary | ICD-10-CM | POA: Diagnosis not present

## 2015-11-26 DIAGNOSIS — F0391 Unspecified dementia with behavioral disturbance: Secondary | ICD-10-CM | POA: Diagnosis not present

## 2015-11-26 DIAGNOSIS — D638 Anemia in other chronic diseases classified elsewhere: Secondary | ICD-10-CM

## 2015-11-26 DIAGNOSIS — K5909 Other constipation: Secondary | ICD-10-CM

## 2015-11-26 DIAGNOSIS — I4891 Unspecified atrial fibrillation: Secondary | ICD-10-CM

## 2015-11-26 DIAGNOSIS — K5900 Constipation, unspecified: Secondary | ICD-10-CM

## 2015-11-26 DIAGNOSIS — I5032 Chronic diastolic (congestive) heart failure: Secondary | ICD-10-CM | POA: Diagnosis not present

## 2015-11-26 DIAGNOSIS — E46 Unspecified protein-calorie malnutrition: Secondary | ICD-10-CM

## 2015-11-26 DIAGNOSIS — J449 Chronic obstructive pulmonary disease, unspecified: Secondary | ICD-10-CM

## 2015-11-26 DIAGNOSIS — E876 Hypokalemia: Secondary | ICD-10-CM | POA: Diagnosis not present

## 2015-11-26 DIAGNOSIS — N390 Urinary tract infection, site not specified: Secondary | ICD-10-CM

## 2015-11-26 NOTE — Progress Notes (Signed)
Patient ID: Jennifer Nielsen, female   DOB: 1924-11-27, 82 y.o.   MRN: [id number]    DATE:     11/26/15  MRN:  [id number]  BIRTHDAY: [birth year]  Facility:  Nursing Home Location:  [place] Room Number: 1106-A  LEVEL OF CARE:  SNF [phone])  Contact Information    Name Relation Home Work Mobile   Wissing,Suzanne Daughter [phone]  [phone]   Lanae Boast Daughter [phone]       Chief Complaint  Patient presents with  . Medical Management of Chronic Issues     HISTORY OF PRESENT ILLNESS:  This is an 82 year old female who is being seen for a routine visit. She is a long-term resident of [facility]. Urine culture result showed >= 100,000CFU/ml Providencia stuartii. No hematuria nor fever noted. No SOB has been noted. CHF has been stable.    PAST MEDICAL HISTORY:  Past Medical History  Diagnosis Date  . Arrhythmia   . Arthritis   . Atrial fibrillation ([place])     Remains stable (As of 05/09/13)  . H/O blood clots   . Hyperlipemia   . GERD (gastroesophageal reflux disease)     Stable (As of 05/09/13)  . Osteoarthritis     Denies pain (As of 05/09/13)  . CHF (congestive heart failure) (HCC)     chronic diastolic  . COPD (chronic obstructive pulmonary disease) ([place])     w/exacerbation  . Dementia     Remains stable & continues to function adequately in the current living environment with supervision. Has had little changes in behavior (AS OF 05/09/13)  . Pleural effusion 11/12/2014  . S/P thoracentesis   . Sepsis ([place])   . PNA (pneumonia)   . Acute respiratory failure ([place])   . Metabolic encephalopathy   . UTI (lower urinary tract infection)   . Protein calorie malnutrition ([place])   . Psychosis      CURRENT MEDICATIONS: Reviewed  Patient's Medications  New Prescriptions   No medications on file  Previous Medications   ACETAMINOPHEN (TYLENOL) 500 MG TABLET    Take 500 mg by mouth 3 (three) times daily.  Do not exceed 4 gms of Tylenol in 24 hours   CALCIUM CARB-CHOLECALCIFEROL (CALCIUM 600/VITAMIN D3 PO)    Take 1 tablet by mouth daily with breakfast.   DILTIAZEM (CARDIZEM CD) 180 MG 24 HR CAPSULE    Take 1 capsule (180 mg total) by mouth daily. For A-Fib   DOCUSATE SODIUM (DSS) 100 MG CAPS    Take 200 mg by mouth 2 (two) times daily. Reported on 06/26/2015   FLUTICASONE-SALMETEROL (ADVAIR HFA) 45-21 MCG/ACT INHALER    Inhale 2 puffs into the lungs 2 (two) times daily.   FUROSEMIDE (LASIX) 20 MG TABLET    Take 60 mg by mouth 2 (two) times daily. Take 3 tablets= 60 mg   IPRATROPIUM-ALBUTEROL (DUONEB) 0.5-2.5 (3) MG/3ML SOLN    Take 3 mLs by nebulization every 4 (four) hours as needed. Inhale contents of vial into the mouth via HHN   MAGNESIUM OXIDE (MAG-OX) 400 MG TABLET    Take 400 mg by mouth daily.   METOLAZONE (ZAROXOLYN) 2.5 MG TABLET    Take 1 tablet (2.5 mg total) by mouth 2 (two) times daily.   MULTIPLE VITAMINS-MINERALS (DECUBI-VITE) CAPS    Take 1 capsule by mouth daily.   OLANZAPINE (ZYPREXA) 5 MG TABLET    Take 5 mg by mouth at bedtime.    POLYETHYL GLYCOL-PROPYL  GLYCOL (SYSTANE OP)    Place 1 drop into both eyes 2 (two) times daily. For dry eyes   POTASSIUM CHLORIDE (KLOR-CON) 20 MEQ PACKET    Take 80 mEq by mouth 2 (two) times daily. At 9AM, 5PM   PROTEIN (PROCEL) POWD    Take 1 scoop by mouth 2 (two) times daily.   RIVAROXABAN (XARELTO) 20 MG TABS TABLET    Take 20 mg by mouth daily with supper.   UNABLE TO FIND    Take 120 mLs by mouth 3 (three) times daily. Med Name: MedPass 120 mL po TID for nutritional support  Modified Medications   No medications on file  Discontinued Medications   No medications on file     Allergies  Allergen Reactions  . Chocolate Itching  . Ciprofloxacin Itching  . Coffee Bean Extract [Coffea Arabica] Itching  . Coffee Flavor Itching  . Eggs Or Egg-Derived Products   . Gentamycin [Gentamicin] Itching  . Imodium [Loperamide] Other (See Comments)     unknown  . Penicillins Itching  . Sulfa Antibiotics Itching  . Sulfacetamide Sodium Itching  . Tea Itching    REVIEW OF SYSTEMS:  GENERAL: no change in appetite, no fatigue, no weight changes, no fever, chills or weakness EYES: Denies change in vision, dry eyes, eye pain, itching or discharge EARS: Denies change in hearing, ringing in ears, or earache NOSE: Denies nasal congestion or epistaxis MOUTH and THROAT: Denies oral discomfort, gingival pain or bleeding, pain from teeth or hoarseness   RESPIRATORY: no cough, SOB, DOE, wheezing, hemoptysis CARDIAC: no chest pain, or palpitations GI: no abdominal pain, diarrhea, constipation, heart burn, nausea or vomiting GU: Denies dysuria, frequency, hematuria or discharge PSYCHIATRIC: Denies feeling of depression or anxiety. No report of hallucinations, insomnia, paranoia, or agitation   PHYSICAL EXAMINATION  GENERAL APPEARANCE: Well nourished. In no acute distress. Normal body habitus SKIN:  Skin is dry and warm. HEAD: Normal in size and contour. No evidence of trauma EYES: Lids open and close normally. No blepharitis, entropion or ectropion. PERRL. Conjunctivae are clear and sclerae are white. Lenses are without opacity EARS: Pinnae are normal. Patient hears normal voice tunes of the examiner MOUTH and THROAT: Lips are without lesions. Oral mucosa is moist and without lesions. Tongue is normal in shape, size, and color and without lesions NECK: supple, trachea midline, no neck masses, no thyroid tenderness, no thyromegaly LYMPHATICS: no LAN in the neck, no supraclavicular LAN RESPIRATORY: breathing is even & unlabored, BS CTAB CARDIAC: irregularly irregular, no murmur,no extra heart sounds, no edema GI: abdomen soft, normal BS, no masses, no tenderness, no hepatomegaly, no splenomegaly EXTREMITIES:  Able to move X 4 extremities; BLE generalized weakness  PSYCHIATRIC: Alert and oriented X 3. Affect and behavior are  appropriate  LABS/RADIOLOGY:  Labs reviewed: Basic Metabolic Panel:  Recent Labs  01/23/15 0511  01/27/15 0448 01/28/15 0550  10/16/15 2005  11/18/15 11/19/15 11/24/15  NA 141  < > 141 141  < > 138  < > 139 142 142  K 4.7  < > 4.3 4.5  < > 5.1  < > 2.6* 5.2 3.1*  CL 90*  < > 78* 72*  --  91*  --   --   --   --   CO2 44*  < > >50* >50*  --  32  --   --   --   --   GLUCOSE 128*  < > 86 92  --  129*  --   --   --   --  BUN 39*  < > 18 17  < > 42*  < > 44* 43* 31*  CREATININE 0.59  < > 0.39* <0.30*  < > 1.04*  < > 1.0 1.1 0.9  CALCIUM 8.6*  < > 8.6* 9.2  --  9.8  --   --   --   --   MG 2.3  --   --   --   --   --   --   --   --   --   < > = values in this interval not displayed. Liver Function Tests:  Recent Labs  12/28/14 1742 01/20/15 1022 07/11/15  AST 32 53* 22  ALT 34 45 17  ALKPHOS 92 99 77  BILITOT 0.9 0.9  --   PROT 7.7 6.9  --   ALBUMIN 3.5 3.1*  --     Recent Labs  01/20/15 1022  LIPASE 15*   CBC:  Recent Labs  01/22/15 0358 01/23/15 0511 01/24/15 0405 07/11/15 11/24/15  WBC 3.9* 6.3 5.6 7.4 6.2  NEUTROABS  --  4.6  --  4 3  HGB 8.7* 9.7* 9.2* 12.2 10.6*  HCT 29.3* 34.1* 32.9* 37 34*  MCV 98.3 98.0 99.1  --   --   PLT 296 337 281 337 279   Lipid Panel:  Recent Labs  08/21/15  HDL 36   CBG:  Recent Labs  01/02/15 0426 01/02/15 0738 01/02/15 1246  GLUCAP 115* 109* 225*    ASSESSMENT/PLAN:  UTI - start Ertapenem 1 gm IM daily X 7 days and Florastor 250 mg 1 capsule PO BID X 10 days  Anemia of chronic disease - hgb 10.6; will monitor  Chronic diastolic CHF - no SOB; continue Lasix 60 mg BID and Zaroxolyn 2.5 mg BID   COPD  - continue Symbicort 160-4.5 mcg/ACT 2 puffs into the lungs BID and Duoneb 0.5-2.5 mg/38ml 1 neb  Q 4 hours PRN  A-Fib with RVR - rate-controlled; continue Cardizem 180 mg 24 hr 1 capsule daily and Xarelto 20 mg 1 tab by mouth daily  Hypokalemia - continue KCL ER 20 meq take 4 tabs = 80 meq PO Q 8AM and 12PM;  check BMP on 11/28/15  Constipation - continue Colace 200 mg daily  Psychosis - continue Zyprexa 5 mg 1 tab PO Q HS  Dementia - advanced; continue supportive care  Protein- calorie malnutrition -  continue Procel 1 scoop by mouth twice a day     Goals of care:  Long-term rehabilitation   Durenda Age, NP [place] [phone]

## 2015-12-23 ENCOUNTER — Non-Acute Institutional Stay (SKILLED_NURSING_FACILITY): Payer: Medicare Other | Admitting: Adult Health

## 2015-12-23 ENCOUNTER — Encounter: Payer: Self-pay | Admitting: Adult Health

## 2015-12-23 DIAGNOSIS — I5032 Chronic diastolic (congestive) heart failure: Secondary | ICD-10-CM

## 2015-12-23 DIAGNOSIS — E876 Hypokalemia: Secondary | ICD-10-CM

## 2015-12-23 DIAGNOSIS — F29 Unspecified psychosis not due to a substance or known physiological condition: Secondary | ICD-10-CM

## 2015-12-23 DIAGNOSIS — E46 Unspecified protein-calorie malnutrition: Secondary | ICD-10-CM | POA: Diagnosis not present

## 2015-12-23 DIAGNOSIS — F0391 Unspecified dementia with behavioral disturbance: Secondary | ICD-10-CM

## 2015-12-23 DIAGNOSIS — I4891 Unspecified atrial fibrillation: Secondary | ICD-10-CM | POA: Diagnosis not present

## 2015-12-23 DIAGNOSIS — K5900 Constipation, unspecified: Secondary | ICD-10-CM | POA: Diagnosis not present

## 2015-12-23 DIAGNOSIS — D638 Anemia in other chronic diseases classified elsewhere: Secondary | ICD-10-CM | POA: Diagnosis not present

## 2015-12-23 DIAGNOSIS — J449 Chronic obstructive pulmonary disease, unspecified: Secondary | ICD-10-CM | POA: Diagnosis not present

## 2015-12-23 DIAGNOSIS — K5909 Other constipation: Secondary | ICD-10-CM

## 2015-12-23 NOTE — Progress Notes (Signed)
Patient ID: Jennifer Nielsen, female   DOB: October 08, 1924, 82 y.o.   MRN: [id number]    DATE:     12/23/15  MRN:  [id number]  BIRTHDAY: June 30, 1924  Facility:  Nursing Home Location:  [place] Room Number: 1106-A  LEVEL OF CARE:  SNF [phone])  Contact Information    Name Relation Home Work Mobile   Sarracino,Suzanne Daughter [phone]  [phone]   Lanae Boast Daughter [phone]       Chief Complaint  Patient presents with  . Medical Management of Chronic Issues     HISTORY OF PRESENT ILLNESS:  This is an 82 year old female who is being seen for a routine visit. She is a long-term resident of [facility].  No SOB has been noted. CHF and COPD has been stable. HR has been rate-controlled. Latest K 3.2  . No complaints of weakness.  PAST MEDICAL HISTORY:  Past Medical History  Diagnosis Date  . Arrhythmia   . Arthritis   . Atrial fibrillation ([place])     Remains stable (As of 05/09/13)  . H/O blood clots   . Hyperlipemia   . GERD (gastroesophageal reflux disease)     Stable (As of 05/09/13)  . Osteoarthritis     Denies pain (As of 05/09/13)  . CHF (congestive heart failure) (HCC)     chronic diastolic  . COPD (chronic obstructive pulmonary disease) ([place])     w/exacerbation  . Dementia     Remains stable & continues to function adequately in the current living environment with supervision. Has had little changes in behavior (AS OF 05/09/13)  . Pleural effusion 11/12/2014  . S/P thoracentesis   . Sepsis ([place])   . PNA (pneumonia)   . Acute respiratory failure ([place])   . Metabolic encephalopathy   . UTI (lower urinary tract infection)   . Protein calorie malnutrition ([place])   . Psychosis   . Dementia with behavioral disturbance   . Anemia of chronic disease      CURRENT MEDICATIONS: Reviewed  Patient's Medications  New Prescriptions   No medications on file  Previous Medications   ACETAMINOPHEN (TYLENOL) 500  MG TABLET    Take 500 mg by mouth 3 (three) times daily. Do not exceed 4 gms of Tylenol in 24 hours   CALCIUM CARB-CHOLECALCIFEROL (CALCIUM 600/VITAMIN D3 PO)    Take 1 tablet by mouth daily with breakfast.   DILTIAZEM (CARDIZEM CD) 180 MG 24 HR CAPSULE    Take 1 capsule (180 mg total) by mouth daily. For A-Fib   DOCUSATE SODIUM (DSS) 100 MG CAPS    Take 200 mg by mouth 2 (two) times daily. Reported on 06/26/2015   FLUTICASONE-SALMETEROL (ADVAIR HFA) 45-21 MCG/ACT INHALER    Inhale 2 puffs into the lungs 2 (two) times daily.   FUROSEMIDE (LASIX) 20 MG TABLET    Take 60 mg by mouth 2 (two) times daily. Take 3 tablets= 60 mg   IPRATROPIUM-ALBUTEROL (DUONEB) 0.5-2.5 (3) MG/3ML SOLN    Take 3 mLs by nebulization every 4 (four) hours as needed. Inhale contents of vial into the mouth via HHN   MAGNESIUM OXIDE (MAG-OX) 400 MG TABLET    Take 400 mg by mouth daily.   METOLAZONE (ZAROXOLYN) 2.5 MG TABLET    Take 1 tablet (2.5 mg total) by mouth 2 (two) times daily.   MULTIPLE VITAMINS-MINERALS (DECUBI-VITE) CAPS    Take 1 capsule by mouth daily.   OLANZAPINE (ZYPREXA) 5 MG TABLET  Take 5 mg by mouth at bedtime.    POLYETHYL GLYCOL-PROPYL GLYCOL (SYSTANE OP)    Place 1 drop into both eyes 2 (two) times daily. For dry eyes   POTASSIUM CHLORIDE (KLOR-CON) 20 MEQ PACKET    Take 80 mEq by mouth 2 (two) times daily. At 9AM, 5PM   PROTEIN (PROCEL) POWD    Take 1 scoop by mouth 2 (two) times daily.   RIVAROXABAN (XARELTO) 20 MG TABS TABLET    Take 20 mg by mouth daily with supper.   UNABLE TO FIND    Take 120 mLs by mouth 3 (three) times daily. Med Name: MedPass 120 mL po TID for nutritional support  Modified Medications   No medications on file  Discontinued Medications   No medications on file     Allergies  Allergen Reactions  . Chocolate Itching  . Ciprofloxacin Itching  . Coffee Bean Extract [Coffea Arabica] Itching  . Coffee Flavor Itching  . Eggs Or Egg-Derived Products   . Gentamycin  [Gentamicin] Itching  . Imodium [Loperamide] Other (See Comments)    unknown  . Penicillins Itching  . Sulfa Antibiotics Itching  . Sulfacetamide Sodium Itching  . Tea Itching    REVIEW OF SYSTEMS:  GENERAL: no change in appetite, no fatigue, no weight changes, no fever, chills or weakness EYES: Denies change in vision, dry eyes, eye pain, itching or discharge EARS: Denies change in hearing, ringing in ears, or earache NOSE: Denies nasal congestion or epistaxis MOUTH and THROAT: Denies oral discomfort, gingival pain or bleeding, pain from teeth or hoarseness   RESPIRATORY: no cough, SOB, DOE, wheezing, hemoptysis CARDIAC: no chest pain, or palpitations GI: no abdominal pain, diarrhea, constipation, heart burn, nausea or vomiting GU: Denies dysuria, frequency, hematuria or discharge PSYCHIATRIC: Denies feeling of depression or anxiety. No report of hallucinations, insomnia, paranoia, or agitation   PHYSICAL EXAMINATION  GENERAL APPEARANCE: Well nourished. In no acute distress. Normal body habitus SKIN:  Skin is dry and warm. HEAD: Normal in size and contour. No evidence of trauma EYES: Lids open and close normally. No blepharitis, entropion or ectropion. PERRL. Conjunctivae are clear and sclerae are white. Lenses are without opacity EARS: Pinnae are normal. Patient hears normal voice tunes of the examiner MOUTH and THROAT: Lips are without lesions. Oral mucosa is moist and without lesions. Tongue is normal in shape, size, and color and without lesions NECK: supple, trachea midline, no neck masses, no thyroid tenderness, no thyromegaly LYMPHATICS: no LAN in the neck, no supraclavicular LAN RESPIRATORY: breathing is even & unlabored, BS CTAB CARDIAC: irregularly irregular, no murmur,no extra heart sounds, no edema GI: abdomen soft, normal BS, no masses, no tenderness, no hepatomegaly, no splenomegaly EXTREMITIES:  Able to move X 4 extremities; BLE generalized weakness  PSYCHIATRIC:  Alert and oriented X 3. Affect and behavior are appropriate  LABS/RADIOLOGY:  Labs reviewed: 11/28/15   Na 142  K 3.2  Glucose 103  BUN 32  Creatinine 0.88  Ca 9.0   123456 Basic Metabolic Panel:  Recent Labs  01/23/15 0511  01/27/15 0448 01/28/15 0550  10/16/15 2005  11/18/15 11/19/15 11/24/15  NA 141  < > 141 141  < > 138  < > 139 142 142  K 4.7  < > 4.3 4.5  < > 5.1  < > 2.6* 5.2 3.1*  CL 90*  < > 78* 72*  --  91*  --   --   --   --   CO2 44*  < > >  50* >50*  --  32  --   --   --   --   GLUCOSE 128*  < > 86 92  --  129*  --   --   --   --   BUN 39*  < > 18 17  < > 42*  < > 44* 43* 31*  CREATININE 0.59  < > 0.39* <0.30*  < > 1.04*  < > 1.0 1.1 0.9  CALCIUM 8.6*  < > 8.6* 9.2  --  9.8  --   --   --   --   MG 2.3  --   --   --   --   --   --   --   --   --   < > = values in this interval not displayed. Liver Function Tests:  Recent Labs  12/28/14 1742 01/20/15 1022 07/11/15  AST 32 53* 22  ALT 34 45 17  ALKPHOS 92 99 77  BILITOT 0.9 0.9  --   PROT 7.7 6.9  --   ALBUMIN 3.5 3.1*  --     Recent Labs  01/20/15 1022  LIPASE 15*   CBC:  Recent Labs  01/22/15 0358 01/23/15 0511 01/24/15 0405 07/11/15 11/24/15  WBC 3.9* 6.3 5.6 7.4 6.2  NEUTROABS  --  4.6  --  4 3  HGB 8.7* 9.7* 9.2* 12.2 10.6*  HCT 29.3* 34.1* 32.9* 37 34*  MCV 98.3 98.0 99.1  --   --   PLT 296 337 281 337 279   Lipid Panel:  Recent Labs  08/21/15  HDL 36   CBG:  Recent Labs  01/02/15 0426 01/02/15 0738 01/02/15 1246  GLUCAP 115* 109* 225*    ASSESSMENT/PLAN:  Anemia of chronic disease - stable  Lab Results  Component Value Date   HGB 10.6* 11/24/2015    Chronic diastolic CHF - no SOB; continue Lasix 60 mg BID and Zaroxolyn 2.5 mg BID   COPD  - continue Symbicort 160-4.5 mcg/ACT 2 puffs into the lungs BID and Duoneb 0.5-2.5 mg/56ml 1 neb  Q 4 hours PRN  A-Fib with RVR - rate-controlled; continue Cardizem 180 mg 24 hr 1 capsule daily and Xarelto 20 mg 1 tab by mouth  daily  Hypokalemia - K 3.2 ;  continue KCL ER 20 meq take 4 tabs = 80 meq PO Q 8AM and 5PM; check CMP   Constipation - continue Colace 200 mg daily  Psychosis - continue Zyprexa 5 mg 1 tab PO Q HS; denies any hallucinations  Dementia - advanced; continue supportive care  Protein- calorie malnutrition -  continue Procel 1 scoop by mouth twice a day; latest 179.6 lbs     Goals of care:  Long-term rehabilitation   Durenda Age, NP [place] [phone]

## 2015-12-24 LAB — HEPATIC FUNCTION PANEL
ALK PHOS: 85 U/L (ref 25–125)
ALT: 15 U/L (ref 7–35)
AST: 19 U/L (ref 13–35)
Bilirubin, Total: 0.5 mg/dL

## 2015-12-24 LAB — BASIC METABOLIC PANEL
BUN: 30 mg/dL — AB (ref 4–21)
CREATININE: 0.8 mg/dL (ref 0.5–1.1)
Glucose: 94 mg/dL
POTASSIUM: 3.5 mmol/L (ref 3.4–5.3)
Sodium: 143 mmol/L (ref 137–147)

## 2016-01-16 ENCOUNTER — Non-Acute Institutional Stay (SKILLED_NURSING_FACILITY): Payer: Medicare Other | Admitting: Internal Medicine

## 2016-01-16 ENCOUNTER — Encounter: Payer: Self-pay | Admitting: Internal Medicine

## 2016-01-16 DIAGNOSIS — Z7901 Long term (current) use of anticoagulants: Secondary | ICD-10-CM

## 2016-01-16 DIAGNOSIS — I482 Chronic atrial fibrillation, unspecified: Secondary | ICD-10-CM

## 2016-01-16 DIAGNOSIS — I5032 Chronic diastolic (congestive) heart failure: Secondary | ICD-10-CM | POA: Diagnosis not present

## 2016-01-16 NOTE — Progress Notes (Signed)
Patient ID: Jennifer Nielsen, female   DOB: [birth year], 82 y.o.   MRN: [id number]      [place]  PCP: Blanchie Serve, MD  Code Status: DNR  Allergies  Allergen Reactions  . Chocolate Itching  . Ciprofloxacin Itching  . Coffee Bean Extract [Coffea Arabica] Itching  . Coffee Flavor Itching  . Eggs Or Egg-Derived Products   . Gentamycin [Gentamicin] Itching  . Imodium [Loperamide] Other (See Comments)    unknown  . Penicillins Itching  . Sulfa Antibiotics Itching  . Sulfacetamide Sodium Itching  . Tea Itching    Chief Complaint  Patient presents with  . Medical Management of Chronic Issues    Routine Visit    Advanced Directives 12/23/2015  Does patient have an advance directive? Yes  Type of Advance Directive Out of facility DNR (pink MOST or yellow form)  Does patient want to make changes to advanced directive? No - Patient declined  Copy of advanced directive(s) in chart? Yes  Would patient like information on creating an advanced directive? -  Pre-existing out of facility DNR order (yellow form or pink MOST form) -    HPI:  82 y.o. patient is seen for routine visit. She has been at her baseline. She is now off o2 with her refusing to use it. She has not had any recent hospital admission. Her breathing has been stable. No new concern from staff.   Review of Systems:  Constitutional: Negative for fever, chills, diaphoresis.  HENT: Negative for headache, congestion Eyes: Negative for blurred vision, double vision and discharge.  Respiratory: Negative for cough, shortness of breath and wheezing.  Cardiovascular: Negative for chest pain, palpitations, leg swelling.  Gastrointestinal: Negative for heartburn, nausea, vomiting, abdominal pain. Had a bowel movement yesterday Genitourinary: Negative for dysuria Musculoskeletal: Negative for back pain, falls in facility.  Skin: Negative for itching, rash.  Neurological: Negative for  dizziness Psychiatric/Behavioral: Negative for depression    Past Medical History:  Diagnosis Date  . Acute respiratory failure ([place])   . Anemia of chronic disease   . Arrhythmia   . Arthritis   . Atrial fibrillation ([place])    Remains stable (As of 80/26/14)  . CHF (congestive heart failure) (HCC)    chronic diastolic  . COPD (chronic obstructive pulmonary disease) ([place])    w/exacerbation  . Dementia    Remains stable & continues to function adequately in the current living environment with supervision. Has had little changes in behavior (AS OF 05/09/13)  . Dementia with behavioral disturbance   . GERD (gastroesophageal reflux disease)    Stable (As of 05/09/13)  . H/O blood clots   . Hyperlipemia   . Metabolic encephalopathy   . Osteoarthritis    Denies pain (As of 05/09/13)  . Pleural effusion 11/12/2014  . PNA (pneumonia)   . Protein calorie malnutrition ([place])   . Psychosis   . S/P thoracentesis   . Sepsis ([place])   . UTI (lower urinary tract infection)    Past Surgical History:  Procedure Laterality Date  . APPENDECTOMY    . GALLBLADDER SURGERY    . HIP SURGERY     Right-Metal plate   . LEG SURGERY     Left leg   Social History:   reports that she has quit smoking. She has never used smokeless tobacco. She reports that she does not drink alcohol or use drugs.  Family History  Problem Relation Age of Onset  . Heart disease Father   .  Breast cancer Daughter   . Colon cancer Neg Hx     Medications:   Medication List       Accurate as of 01/16/16  2:44 PM. Always use your most recent med list.          acetaminophen 500 MG tablet Commonly known as:  TYLENOL Take 500 mg by mouth 3 (three) times daily. Do not exceed 4 gms of Tylenol in 24 hours   CALCIUM 600/VITAMIN D3 PO Take 1 tablet by mouth daily with breakfast.   DECUBI-VITE Caps Take 1 capsule by mouth daily.   diltiazem 180 MG 24 hr capsule Commonly known as:  CARDIZEM CD Take 1 capsule (180  mg total) by mouth daily. For A-Fib   docusate sodium 100 MG capsule Commonly known as:  COLACE Take 200 mg by mouth daily.   fluticasone-salmeterol 45-21 MCG/ACT inhaler Commonly known as:  ADVAIR HFA Inhale 2 puffs into the lungs 2 (two) times daily.   furosemide 20 MG tablet Commonly known as:  LASIX Take 60 mg by mouth 2 (two) times daily. Take 3 tablets= 60 mg   ipratropium-albuterol 0.5-2.5 (3) MG/3ML Soln Commonly known as:  DUONEB Take 3 mLs by nebulization every 4 (four) hours as needed. Inhale contents of vial into the mouth via HHN   magnesium oxide 400 MG tablet Commonly known as:  MAG-OX Take 400 mg by mouth daily.   metolazone 2.5 MG tablet Commonly known as:  ZAROXOLYN Take 1 tablet (2.5 mg total) by mouth 2 (two) times daily.   OLANZapine 5 MG tablet Commonly known as:  ZYPREXA Take 5 mg by mouth at bedtime.   potassium chloride 20 MEQ packet Commonly known as:  KLOR-CON Take 80 mEq by mouth 2 (two) times daily. At 9AM, 5PM   PROCEL Powd Take 1 scoop by mouth 2 (two) times daily.   rivaroxaban 20 MG Tabs tablet Commonly known as:  XARELTO Take 20 mg by mouth daily with supper.   SYSTANE OP Place 1 drop into both eyes 2 (two) times daily. For dry eyes   UNABLE TO FIND Take 120 mLs by mouth 3 (three) times daily. Med Name: MedPass 120 mL po TID for nutritional support        Physical Exam: Vitals:   01/16/16 1436  BP: (!) 123/55  Pulse: 71  Resp: 18  Temp: (!) 96.8 F (36 C)  TempSrc: Oral  SpO2: 97%  Weight: 179 lb 9.6 oz (81.5 kg)  Height: 5\' 4"  (1.626 m)  Body mass index is 30.83 kg/m.  General- elderly female, obese, chronically ill appearing, in no acute distress Head- normocephalic, atraumatic Throat- moist mucus membrane Eyes- no pallor, no icterus, no discharge, normal conjunctiva, normal sclera Neck- no cervical lymphadenopathy Cardiovascular- normal s1,s2, no murmurs, no leg edema Respiratory- CTAB, no wheeze, no rhonchi,  no crackles, no use of accessory muscles Abdomen- bowel sounds present, soft, non tender Musculoskeletal- generalized weakness in lower extremities, on wheelchair, kyphosis +, gets OOB daily Neurological- alert and oriented to person, place and time Skin- warm and dry Psychiatry- calm with normal affect   Labs reviewed: Basic Metabolic Panel:  Recent Labs  01/23/15 0511  01/27/15 0448 01/28/15 0550  10/16/15 2005  11/19/15 11/24/15 12/24/15  NA 141  < > 141 141  < > 138  < > 142 142 143  K 4.7  < > 4.3 4.5  < > 5.1  < > 5.2 3.1* 3.5  CL 90*  < > 78*  72*  --  91*  --   --   --   --   CO2 44*  < > >50* >50*  --  32  --   --   --   --   GLUCOSE 128*  < > 86 92  --  129*  --   --   --   --   BUN 39*  < > 18 17  < > 42*  < > 43* 31* 30*  CREATININE 0.59  < > 0.39* <0.30*  < > 1.04*  < > 1.1 0.9 0.8  CALCIUM 8.6*  < > 8.6* 9.2  --  9.8  --   --   --   --   MG 2.3  --   --   --   --   --   --   --   --   --   < > = values in this interval not displayed. Liver Function Tests:  Recent Labs  01/20/15 1022 07/11/15 12/24/15  AST 53* 22 19  ALT 45 17 15  ALKPHOS 99 77 85  BILITOT 0.9  --   --   PROT 6.9  --   --   ALBUMIN 3.1*  --   --     Recent Labs  01/20/15 1022  LIPASE 15*   No results for input(s): AMMONIA in the last 8760 hours. CBC:  Recent Labs  01/22/15 0358 01/23/15 0511 01/24/15 0405 07/11/15 11/24/15  WBC 3.9* 6.3 5.6 7.4 6.2  NEUTROABS  --  4.6  --  4 3  HGB 8.7* 9.7* 9.2* 12.2 10.6*  HCT 29.3* 34.1* 32.9* 37 34*  MCV 98.3 98.0 99.1  --   --   PLT 296 337 281 337 279      Assessment/Plan  Hypertensive disease with heart failure bp on review 107- 128/51-67. Continue lasix for now.   Chronic diastolic CHF Wt Readings from Last 3 Encounters:  01/16/16 179 lb 9.6 oz (81.5 kg)  12/23/15 171 lb 12.8 oz (77.9 kg)  11/26/15 171 lb 12.8 oz (77.9 kg)  weight check shows weight gain but no signs of fluid overload on exam. Monitor weight 3 days a week.  Continue lasix 60 mg bid, metolazone 2.5 mg bid. Continue kcl supplement. BMP Latest Ref Rng & Units 12/24/2015 11/24/2015 11/19/2015  Glucose 65 - 99 mg/dL - - -  BUN 4 - 21 mg/dL 30(A) 31(A) 43(A)  Creatinine 0.5 - 1.1 mg/dL 0.8 0.9 1.1  Sodium 137 - 147 mmol/L 143 142 142  Potassium 3.4 - 5.3 mmol/L 3.5 3.1(A) 5.2  Chloride 101 - 111 mmol/L - - -  CO2 22 - 32 mmol/L - - -  Calcium 8.9 - 10.3 mg/dL - - -    Chronic atrial fibrillation Rate controlled. continue Cardizem 180 mg daily and Xarelto 20 mg daily   Long term anticoagulation Continue xarelto and monitor for fall and bleed    Family/ staff Communication: reviewed care plan with patient and nursing supervisor    Blanchie Serve, MD  [place] [phone] (Monday-Friday 8 am - 5 pm) [phone] (afterhours)

## 2016-02-13 DIAGNOSIS — E876 Hypokalemia: Secondary | ICD-10-CM

## 2016-02-13 HISTORY — DX: Hypokalemia: E87.6

## 2016-02-20 ENCOUNTER — Encounter: Payer: Self-pay | Admitting: Adult Health

## 2016-02-20 ENCOUNTER — Non-Acute Institutional Stay (SKILLED_NURSING_FACILITY): Payer: Medicare Other | Admitting: Adult Health

## 2016-02-20 DIAGNOSIS — J449 Chronic obstructive pulmonary disease, unspecified: Secondary | ICD-10-CM | POA: Diagnosis not present

## 2016-02-20 DIAGNOSIS — E46 Unspecified protein-calorie malnutrition: Secondary | ICD-10-CM

## 2016-02-20 DIAGNOSIS — F0391 Unspecified dementia with behavioral disturbance: Secondary | ICD-10-CM | POA: Diagnosis not present

## 2016-02-20 DIAGNOSIS — I482 Chronic atrial fibrillation, unspecified: Secondary | ICD-10-CM

## 2016-02-20 DIAGNOSIS — F29 Unspecified psychosis not due to a substance or known physiological condition: Secondary | ICD-10-CM

## 2016-02-20 DIAGNOSIS — K5909 Other constipation: Secondary | ICD-10-CM

## 2016-02-20 DIAGNOSIS — I5032 Chronic diastolic (congestive) heart failure: Secondary | ICD-10-CM | POA: Diagnosis not present

## 2016-02-20 DIAGNOSIS — E876 Hypokalemia: Secondary | ICD-10-CM | POA: Diagnosis not present

## 2016-02-20 DIAGNOSIS — M159 Polyosteoarthritis, unspecified: Secondary | ICD-10-CM | POA: Diagnosis not present

## 2016-02-20 DIAGNOSIS — K5900 Constipation, unspecified: Secondary | ICD-10-CM | POA: Diagnosis not present

## 2016-02-20 DIAGNOSIS — D638 Anemia in other chronic diseases classified elsewhere: Secondary | ICD-10-CM

## 2016-02-20 NOTE — Progress Notes (Signed)
Patient ID: Jennifer Nielsen, female   DOB: 29-Aug-1924, 82 y.o.   MRN: [id number]    DATE:     02/20/16  MRN:  [id number]  BIRTHDAY: 08-01-1924  Facility:  Nursing Home Location:  [place] Room Number: 1106-A  LEVEL OF CARE:  SNF [phone])  Contact Information    Name Relation Home Work Mobile   Rymer,Suzanne Daughter [phone]  [phone]   Lanae Boast Daughter [phone]       Chief Complaint  Patient presents with  . Medical Management of Chronic Issues     HISTORY OF PRESENT ILLNESS:  This is an 82 year old female who is being seen for a routine visit. She is a long-term resident of [facility]  She has been stable for the past month. No SOB. Weight is steady @ 179.7 lbs.    PAST MEDICAL HISTORY:  Past Medical History:  Diagnosis Date  . Acute respiratory failure ([place])   . Anemia of chronic disease   . Arrhythmia   . Arthritis   . Atrial fibrillation ([place])    Remains stable (As of 05/09/13)  . CHF (congestive heart failure) (HCC)    chronic diastolic  . COPD (chronic obstructive pulmonary disease) ([place])    w/exacerbation  . Dementia    Remains stable & continues to function adequately in the current living environment with supervision. Has had little changes in behavior (AS OF 05/09/13)  . Dementia with behavioral disturbance   . GERD (gastroesophageal reflux disease)    Stable (As of 05/09/13)  . H/O blood clots   . Hyperlipemia   . Metabolic encephalopathy   . Osteoarthritis    Denies pain (As of 05/09/13)  . Pleural effusion 11/12/2014  . PNA (pneumonia)   . Protein calorie malnutrition ([place])   . Psychosis   . S/P thoracentesis   . Sepsis ([place])   . UTI (lower urinary tract infection)      CURRENT MEDICATIONS: Reviewed  Patient's Medications  New Prescriptions   No medications on file  Previous Medications   ACETAMINOPHEN (TYLENOL) 500 MG TABLET    Take 500 mg by mouth 3 (three) times  daily. Do not exceed 4 gms of Tylenol in 24 hours   CALCIUM CARB-CHOLECALCIFEROL (CALCIUM 600/VITAMIN D3 PO)    Take 1 tablet by mouth daily with breakfast.   DILTIAZEM (CARDIZEM CD) 180 MG 24 HR CAPSULE    Take 1 capsule (180 mg total) by mouth daily. For A-Fib   DOCUSATE SODIUM (COLACE) 100 MG CAPSULE    Take 200 mg by mouth daily.   FLUTICASONE-SALMETEROL (ADVAIR HFA) 45-21 MCG/ACT INHALER    Inhale 2 puffs into the lungs 2 (two) times daily.   FUROSEMIDE (LASIX) 20 MG TABLET    Take 60 mg by mouth 2 (two) times daily. Take 3 tablets= 60 mg    IPRATROPIUM-ALBUTEROL (DUONEB) 0.5-2.5 (3) MG/3ML SOLN    Take 3 mLs by nebulization every 4 (four) hours as needed. Inhale contents of vial into the mouth via HHN   MAGNESIUM OXIDE (MAG-OX) 400 MG TABLET    Take 400 mg by mouth daily.   METOLAZONE (ZAROXOLYN) 2.5 MG TABLET    Take 1 tablet (2.5 mg total) by mouth 2 (two) times daily.   MULTIPLE VITAMINS-MINERALS (DECUBI-VITE) CAPS    Take 1 capsule by mouth daily.   OLANZAPINE (ZYPREXA) 5 MG TABLET    Take 5 mg by mouth at bedtime.    POLYETHYL GLYCOL-PROPYL GLYCOL (SYSTANE  OP)    Place 1 drop into both eyes 2 (two) times daily. For dry eyes   POTASSIUM CHLORIDE (KLOR-CON) 20 MEQ PACKET    Take 80 mEq by mouth 2 (two) times daily. At 9AM, 5PM   PROTEIN (PROCEL) POWD    Take 1 scoop by mouth 2 (two) times daily.    RIVAROXABAN (XARELTO) 20 MG TABS TABLET    Take 20 mg by mouth daily with supper.   UNABLE TO FIND    Take 120 mLs by mouth 3 (three) times daily. Med Name: MedPass 120 mL po TID for nutritional support  Modified Medications   No medications on file  Discontinued Medications   No medications on file     Allergies  Allergen Reactions  . Chocolate Itching  . Ciprofloxacin Itching  . Coffee Bean Extract [Coffea Arabica] Itching  . Coffee Flavor Itching  . Eggs Or Egg-Derived Products   . Gentamycin [Gentamicin] Itching  . Imodium [Loperamide] Other (See Comments)    unknown  .  Penicillins Itching  . Sulfa Antibiotics Itching  . Sulfacetamide Sodium Itching  . Tea Itching    REVIEW OF SYSTEMS:  GENERAL: no change in appetite, no fatigue, no weight changes, no fever, chills or weakness EYES: Denies change in vision, dry eyes, eye pain, itching or discharge EARS: Denies change in hearing, ringing in ears, or earache NOSE: Denies nasal congestion or epistaxis MOUTH and THROAT: Denies oral discomfort, gingival pain or bleeding, pain from teeth or hoarseness   RESPIRATORY: no cough, SOB, DOE, wheezing, hemoptysis CARDIAC: no chest pain, or palpitations GI: no abdominal pain, diarrhea, constipation, heart burn, nausea or vomiting GU: Denies dysuria, frequency, hematuria or discharge PSYCHIATRIC: Denies feeling of depression or anxiety. No report of hallucinations, insomnia, paranoia, or agitation   PHYSICAL EXAMINATION  GENERAL APPEARANCE: Well nourished. In no acute distress. Normal body habitus SKIN:  Skin is dry and warm. HEAD: Normal in size and contour. No evidence of trauma EYES: Lids open and close normally. No blepharitis, entropion or ectropion. PERRL. Conjunctivae are clear and sclerae are white. Lenses are without opacity EARS: Pinnae are normal. Patient hears normal voice tunes of the examiner MOUTH and THROAT: Lips are without lesions. Oral mucosa is moist and without lesions. Tongue is normal in shape, size, and color and without lesions NECK: supple, trachea midline, no neck masses, no thyroid tenderness, no thyromegaly LYMPHATICS: no LAN in the neck, no supraclavicular LAN RESPIRATORY: breathing is even & unlabored, BS CTAB CARDIAC: irregularly irregular, no murmur,no extra heart sounds, no edema GI: abdomen soft, normal BS, no masses, no tenderness, no hepatomegaly, no splenomegaly EXTREMITIES:  Able to move X 4 extremities; BLE generalized weakness  PSYCHIATRIC: Alert and oriented X 3. Affect and behavior are  appropriate  LABS/RADIOLOGY: Labs reviewed: 11/28/15   Na 142  K 3.2  Glucose 103  BUN 32  Creatinine 0.88  Ca 9.0   123456 Basic Metabolic Panel:  Recent Labs  10/16/15 2005  11/19/15 11/24/15 12/24/15  NA 138  < > 142 142 143  K 5.1  < > 5.2 3.1* 3.5  CL 91*  --   --   --   --   CO2 32  --   --   --   --   GLUCOSE 129*  --   --   --   --   BUN 42*  < > 43* 31* 30*  CREATININE 1.04*  < > 1.1 0.9 0.8  CALCIUM 9.8  --   --   --   --   < > =  values in this interval not displayed. Liver Function Tests:  Recent Labs  07/11/15 12/24/15  AST 22 19  ALT 17 15  ALKPHOS 77 85   No results for input(s): LIPASE, AMYLASE in the last 8760 hours. CBC:  Recent Labs  07/11/15 11/24/15  WBC 7.4 6.2  NEUTROABS 4 3  HGB 12.2 10.6*  HCT 37 34*  PLT 337 279   Lipid Panel:  Recent Labs  08/21/15  HDL 36     ASSESSMENT/PLAN:  Anemia of chronic disease - check CBC Lab Results  Component Value Date   HGB 10.6 (A) 11/24/2015    Chronic diastolic CHF - no SOB; continue Lasix 60 mg BID and Zaroxolyn 2.5 mg BID; check BMP   COPD  - continue Symbicort 160-4.5 mcg/ACT 2 puffs into the lungs BID and Duoneb 0.5-2.5 mg/78ml 1 neb  Q 4 hours PRN  A-Fib with RVR - rate-controlled; continue Cardizem 180 mg 24 hr 1 capsule daily and Xarelto 20 mg 1 tab by mouth daily  Hypokalemia - continue KCL ER 20 meq take 4 tabs = 80 meq PO Q 9AM and 5PM; check CMP   Constipation - continue Colace 200 mg daily  Psychosis - continue Zyprexa 5 mg 1 tab PO Q HS; denies any hallucinations  Dementia - advanced; continue supportive care  Protein- calorie malnutrition -  continue Procel 1 scoop by mouth twice a day and Medpass 120 ml PO TID  Osteoarthrosis - continue Acetaminophen 500 mg 1 tab PO TID     Goals of care:  Long-term rehabilitation   Durenda Age, NP [place] [phone]

## 2016-02-23 LAB — CBC AND DIFFERENTIAL
HCT: 35 % — AB (ref 36–46)
Hemoglobin: 10.9 g/dL — AB (ref 12.0–16.0)
Neutrophils Absolute: 4 /uL
PLATELETS: 308 10*3/uL (ref 150–399)
WBC: 7.1 10*3/mL

## 2016-02-23 LAB — BASIC METABOLIC PANEL
BUN: 34 mg/dL — AB (ref 4–21)
CREATININE: 0.9 mg/dL (ref 0.5–1.1)
Glucose: 93 mg/dL
POTASSIUM: 3.7 mmol/L (ref 3.4–5.3)
Sodium: 141 mmol/L (ref 137–147)

## 2016-03-02 LAB — BASIC METABOLIC PANEL
BUN: 28 mg/dL — AB (ref 4–21)
CREATININE: 0.8 mg/dL (ref 0.5–1.1)
GLUCOSE: 91 mg/dL
POTASSIUM: 3.1 mmol/L — AB (ref 3.4–5.3)
Sodium: 143 mmol/L (ref 137–147)

## 2016-03-03 ENCOUNTER — Non-Acute Institutional Stay (SKILLED_NURSING_FACILITY): Payer: Medicare Other | Admitting: Adult Health

## 2016-03-03 ENCOUNTER — Encounter: Payer: Self-pay | Admitting: Adult Health

## 2016-03-03 DIAGNOSIS — E876 Hypokalemia: Secondary | ICD-10-CM

## 2016-03-03 NOTE — Progress Notes (Signed)
Patient ID: Jennifer Nielsen, female   DOB: 04-17-1925, 82 y.o.   MRN: [id number]    DATE:     03/03/16  MRN:  [id number]  BIRTHDAY: [birth year]  Facility:  Nursing Home Location:  [place] Room Number: 1106-A  LEVEL OF CARE:  SNF [phone])  Contact Information    Name Relation Home Work Mobile   Porco,Suzanne Daughter [phone]  [phone]   Lanae Boast Daughter [phone]       Chief Complaint  Patient presents with  . Acute Visit    Hypokalemia     HISTORY OF PRESENT ILLNESS:  This is an 82 year old female who was noted to have K 3.1 , low. She is currently taking Lasix 60 mg BID and Metolazone 2.5 mg BID for CHF. She is also on K supplement, KCL  80 meq BID. Patient did not complain of any weakness no muscle spasm.   PAST MEDICAL HISTORY:  Past Medical History:  Diagnosis Date  . Acute respiratory failure ([place])   . Anemia of chronic disease   . Arrhythmia   . Arthritis   . Atrial fibrillation ([place])    Remains stable (As of 05/09/13)  . CHF (congestive heart failure) (HCC)    chronic diastolic  . COPD (chronic obstructive pulmonary disease) ([place])    w/exacerbation  . Dementia    Remains stable & continues to function adequately in the current living environment with supervision. Has had little changes in behavior (AS OF 05/09/13)  . Dementia with behavioral disturbance   . GERD (gastroesophageal reflux disease)    Stable (As of 05/09/13)  . H/O blood clots   . Hyperlipemia   . Hypokalemia 02/2016  . Metabolic encephalopathy   . Osteoarthritis    Denies pain (As of 05/09/13)  . Pleural effusion 11/12/2014  . PNA (pneumonia)   . Protein calorie malnutrition ([place])   . Psychosis   . S/P thoracentesis   . Sepsis ([place])   . UTI (lower urinary tract infection)      CURRENT MEDICATIONS: Reviewed  Patient's Medications  New Prescriptions   No medications on file  Previous Medications   ACETAMINOPHEN  (TYLENOL) 500 MG TABLET    Take 500 mg by mouth 3 (three) times daily. Do not exceed 4 gms of Tylenol in 24 hours   CALCIUM CARB-CHOLECALCIFEROL (CALCIUM 600/VITAMIN D3 PO)    Take 1 tablet by mouth daily with breakfast.   DILTIAZEM (CARDIZEM CD) 180 MG 24 HR CAPSULE    Take 1 capsule (180 mg total) by mouth daily. For A-Fib   DOCUSATE SODIUM (COLACE) 100 MG CAPSULE    Take 200 mg by mouth daily.   FLUTICASONE-SALMETEROL (ADVAIR HFA) 45-21 MCG/ACT INHALER    Inhale 2 puffs into the lungs 2 (two) times daily.   FUROSEMIDE (LASIX) 20 MG TABLET    Take 60 mg by mouth 2 (two) times daily. Take 3 tablets= 60 mg    IPRATROPIUM-ALBUTEROL (DUONEB) 0.5-2.5 (3) MG/3ML SOLN    Take 3 mLs by nebulization every 4 (four) hours as needed. Inhale contents of vial into the mouth via HHN   MAGNESIUM OXIDE (MAG-OX) 400 MG TABLET    Take 400 mg by mouth daily.   METOLAZONE (ZAROXOLYN) 2.5 MG TABLET    Take 1 tablet (2.5 mg total) by mouth 2 (two) times daily.   MULTIPLE VITAMINS-MINERALS (DECUBI-VITE) CAPS    Take 1 capsule by mouth daily.   OLANZAPINE (ZYPREXA) 5 MG TABLET  Take 5 mg by mouth at bedtime.    POLYETHYL GLYCOL-PROPYL GLYCOL (SYSTANE OP)    Place 1 drop into both eyes 2 (two) times daily. For dry eyes   POTASSIUM CHLORIDE (KLOR-CON) 20 MEQ PACKET    Take 80 mEq by mouth 2 (two) times daily. At 9AM, [place] one time dose of 40 mEq now (03/03/16).   PROTEIN (PROCEL) POWD    Take 1 scoop by mouth 2 (two) times daily.    RIVAROXABAN (XARELTO) 20 MG TABS TABLET    Take 20 mg by mouth daily with supper.   UNABLE TO FIND    Take 120 mLs by mouth 3 (three) times daily. Med Name: MedPass 120 mL po TID for nutritional support  Modified Medications   No medications on file  Discontinued Medications   No medications on file     Allergies  Allergen Reactions  . Chocolate Itching  . Ciprofloxacin Itching  . Coffee Bean Extract [Coffea Arabica] Itching  . Coffee Flavor Itching  . Eggs Or Egg-Derived  Products   . Gentamycin [Gentamicin] Itching  . Imodium [Loperamide] Other (See Comments)    unknown  . Penicillins Itching  . Sulfa Antibiotics Itching  . Sulfacetamide Sodium Itching  . Tea Itching    REVIEW OF SYSTEMS:  GENERAL: no change in appetite, no fatigue, no weight changes, no fever, chills or weakness EYES: Denies change in vision, dry eyes, eye pain, itching or discharge EARS: Denies change in hearing, ringing in ears, or earache NOSE: Denies nasal congestion or epistaxis MOUTH and THROAT: Denies oral discomfort, gingival pain or bleeding, pain from teeth or hoarseness   RESPIRATORY: no cough, SOB, DOE, wheezing, hemoptysis CARDIAC: no chest pain, or palpitations GI: no abdominal pain, diarrhea, constipation, heart burn, nausea or vomiting GU: Denies dysuria, frequency, hematuria or discharge PSYCHIATRIC: Denies feeling of depression or anxiety. No report of hallucinations, insomnia, paranoia, or agitation   PHYSICAL EXAMINATION  GENERAL APPEARANCE: Well nourished. In no acute distress. Normal body habitus SKIN:  Has right inner thigh wound covered with hydrocolloid dressing HEAD: Normal in size and contour. No evidence of trauma EYES: Lids open and close normally. No blepharitis, entropion or ectropion. PERRL. Conjunctivae are clear and sclerae are white. Lenses are without opacity EARS: Pinnae are normal. Patient hears normal voice tunes of the examiner MOUTH and THROAT: Lips are without lesions. Oral mucosa is moist and without lesions. Tongue is normal in shape, size, and color and without lesions NECK: supple, trachea midline, no neck masses, no thyroid tenderness, no thyromegaly LYMPHATICS: no LAN in the neck, no supraclavicular LAN RESPIRATORY: breathing is even & unlabored, BS CTAB CARDIAC: irregularly irregular, no murmur,no extra heart sounds, no edema GI: abdomen soft, normal BS, no masses, no tenderness, no hepatomegaly, no splenomegaly EXTREMITIES:  Able  to move X 4 extremities; BLE generalized weakness  PSYCHIATRIC: Alert and oriented X 3. Affect and behavior are appropriate  LABS/RADIOLOGY: Labs reviewed: 11/28/15   Na 142  K 3.2  Glucose 103  BUN 32  Creatinine 0.88  Ca 9.0   123456 Basic Metabolic Panel:  Recent Labs  10/16/15 2005  12/24/15 02/23/16 03/02/16  NA 138  < > 143 141 143  K 5.1  < > 3.5 3.7 3.1*  CL 91*  --   --   --   --   CO2 32  --   --   --   --   GLUCOSE 129*  --   --   --   --  BUN 42*  < > 30* 34* 28*  CREATININE 1.04*  < > 0.8 0.9 0.8  CALCIUM 9.8  --   --   --   --   < > = values in this interval not displayed. Liver Function Tests:  Recent Labs  07/11/15 12/24/15  AST 22 19  ALT 17 15  ALKPHOS 77 85   CBC:  Recent Labs  07/11/15 11/24/15 02/23/16  WBC 7.4 6.2 7.1  NEUTROABS 4 3 4   HGB 12.2 10.6* 10.9*  HCT 37 34* 35*  PLT 337 279 308   Lipid Panel:  Recent Labs  08/21/15  HDL 36     ASSESSMENT/PLAN:  Hypokalemia - give KCL ER 20 meq 3 tabs PO NOW then continue KCL ER 20 meq 4 tabs = 80 meq PO BID; BMP in AM    Durenda Age, NP [employer] [phone]

## 2016-03-04 LAB — BASIC METABOLIC PANEL
BUN: 22 mg/dL — AB (ref 4–21)
Creatinine: 0.7 mg/dL (ref 0.5–1.1)
GLUCOSE: 84 mg/dL
POTASSIUM: 2.9 mmol/L — AB (ref 3.4–5.3)
SODIUM: 139 mmol/L (ref 137–147)

## 2016-03-05 LAB — BASIC METABOLIC PANEL
BUN: 22 mg/dL — AB (ref 4–21)
Creatinine: 0.8 mg/dL (ref 0.5–1.1)
Glucose: 91 mg/dL
Potassium: 4 mmol/L (ref 3.4–5.3)
SODIUM: 143 mmol/L (ref 137–147)

## 2016-03-11 LAB — BASIC METABOLIC PANEL
BUN: 24 mg/dL — AB (ref 4–21)
Creatinine: 0.8 mg/dL (ref 0.5–1.1)
GLUCOSE: 95 mg/dL
Potassium: 3.2 mmol/L — AB (ref 3.4–5.3)
Sodium: 144 mmol/L (ref 137–147)

## 2016-03-15 ENCOUNTER — Encounter: Payer: Self-pay | Admitting: Adult Health

## 2016-03-15 ENCOUNTER — Non-Acute Institutional Stay (SKILLED_NURSING_FACILITY): Payer: Medicare Other | Admitting: Adult Health

## 2016-03-15 DIAGNOSIS — I482 Chronic atrial fibrillation, unspecified: Secondary | ICD-10-CM

## 2016-03-15 DIAGNOSIS — F29 Unspecified psychosis not due to a substance or known physiological condition: Secondary | ICD-10-CM

## 2016-03-15 DIAGNOSIS — J449 Chronic obstructive pulmonary disease, unspecified: Secondary | ICD-10-CM | POA: Diagnosis not present

## 2016-03-15 DIAGNOSIS — F0390 Unspecified dementia without behavioral disturbance: Secondary | ICD-10-CM

## 2016-03-15 DIAGNOSIS — E876 Hypokalemia: Secondary | ICD-10-CM | POA: Diagnosis not present

## 2016-03-15 DIAGNOSIS — I5032 Chronic diastolic (congestive) heart failure: Secondary | ICD-10-CM

## 2016-03-15 NOTE — Progress Notes (Signed)
Patient ID: Jennifer Nielsen, female   DOB: 1924/09/20, 82 y.o.   MRN: [id number]    DATE:     03/15/16  MRN:  [id number]  BIRTHDAY: Sep 16, 1924  Facility:  Nursing Home Location:  [place] Room Number: 1106-A  LEVEL OF CARE:  SNF [phone])  Contact Information    Name Relation Home Work Mobile   Tennyson,Suzanne Daughter [phone]  [phone]   Lanae Boast Daughter [phone]       Chief Complaint  Patient presents with  . Medical Management of Chronic Issues     HISTORY OF PRESENT ILLNESS:  This is an 82 year old female who is being seen for a routine visit. She is a long-term resident of [facility].  Latest K 3.2 . She takes KCL supplementation and on chronic Lasix and Metolazone for CHF .    PAST MEDICAL HISTORY:  Past Medical History:  Diagnosis Date  . Acute respiratory failure ([place])   . Anemia of chronic disease   . Arrhythmia   . Arthritis   . Atrial fibrillation ([place])    Remains stable (As of 05/09/13)  . CHF (congestive heart failure) (HCC)    chronic diastolic  . COPD (chronic obstructive pulmonary disease) ([place])    w/exacerbation  . Dementia    Remains stable & continues to function adequately in the current living environment with supervision. Has had little changes in behavior (AS OF 05/09/13)  . Dementia with behavioral disturbance   . GERD (gastroesophageal reflux disease)    Stable (As of 05/09/13)  . H/O blood clots   . Hyperlipemia   . Hypokalemia 02/2016  . Metabolic encephalopathy   . Osteoarthritis    Denies pain (As of 05/09/13)  . Pleural effusion 11/12/2014  . PNA (pneumonia)   . Protein calorie malnutrition ([place])   . Psychosis   . S/P thoracentesis   . Sepsis ([place])   . UTI (lower urinary tract infection)      CURRENT MEDICATIONS: Reviewed  Patient's Medications  New Prescriptions   No medications on file  Previous Medications   ACETAMINOPHEN (TYLENOL) 500 MG TABLET     Take 500 mg by mouth 3 (three) times daily. Do not exceed 4 gms of Tylenol in 24 hours   CALCIUM CARB-CHOLECALCIFEROL (CALCIUM 600/VITAMIN D3 PO)    Take 1 tablet by mouth daily with breakfast.   DILTIAZEM (CARDIZEM CD) 180 MG 24 HR CAPSULE    Take 1 capsule (180 mg total) by mouth daily. For A-Fib   DOCUSATE SODIUM (COLACE) 100 MG CAPSULE    Take 200 mg by mouth daily.   FLUTICASONE-SALMETEROL (ADVAIR HFA) 45-21 MCG/ACT INHALER    Inhale 2 puffs into the lungs 2 (two) times daily.    FUROSEMIDE (LASIX) 20 MG TABLET    Take 40 mg by mouth every evening.    FUROSEMIDE (LASIX) 20 MG TABLET    Take 20 mg by mouth every morning. Take 3 tablets to = 60 mg qam   IPRATROPIUM-ALBUTEROL (DUONEB) 0.5-2.5 (3) MG/3ML SOLN    Take 3 mLs by nebulization every 4 (four) hours as needed. Inhale contents of vial into the mouth via HHN   MAGNESIUM OXIDE (MAG-OX) 400 MG TABLET    Take 400 mg by mouth daily.   METOLAZONE (ZAROXOLYN) 2.5 MG TABLET    Take 1 tablet (2.5 mg total) by mouth 2 (two) times daily.   MULTIPLE VITAMINS-MINERALS (DECUBI-VITE) CAPS    Take 1 capsule by mouth daily.  OLANZAPINE (ZYPREXA) 5 MG TABLET    Take 5 mg by mouth at bedtime.    POLYETHYL GLYCOL-PROPYL GLYCOL (SYSTANE OP)    Place 1 drop into both eyes 2 (two) times daily. For dry eyes   POTASSIUM CHLORIDE (KLOR-CON) 20 MEQ PACKET    Take 80 mEq by mouth 2 (two) times daily. Take 4 tabs to = 80 mEq   POTASSIUM CHLORIDE SA (K-DUR,KLOR-CON) 20 MEQ TABLET    Take 40 mEq by mouth daily. Take at noon   PROTEIN (PROCEL) POWD    Take 1 scoop by mouth 2 (two) times daily.    RIVAROXABAN (XARELTO) 20 MG TABS TABLET    Take 20 mg by mouth daily with supper.   UNABLE TO FIND    Take 120 mLs by mouth 3 (three) times daily. Med Name: MedPass 120 mL po TID for nutritional support  Modified Medications   No medications on file  Discontinued Medications   No medications on file     Allergies  Allergen Reactions  . Chocolate Itching  .  Ciprofloxacin Itching  . Coffee Bean Extract [Coffea Arabica] Itching  . Coffee Flavor Itching  . Eggs Or Egg-Derived Products   . Gentamycin [Gentamicin] Itching  . Imodium [Loperamide] Other (See Comments)    unknown  . Penicillins Itching  . Sulfa Antibiotics Itching  . Sulfacetamide Sodium Itching  . Tea Itching    REVIEW OF SYSTEMS:  GENERAL: no change in appetite, no fatigue, no weight changes, no fever, chills or weakness EYES: Denies change in vision, dry eyes, eye pain, itching or discharge EARS: Denies change in hearing, ringing in ears, or earache NOSE: Denies nasal congestion or epistaxis MOUTH and THROAT: Denies oral discomfort, gingival pain or bleeding, pain from teeth or hoarseness   RESPIRATORY: no cough, SOB, DOE, wheezing, hemoptysis CARDIAC: no chest pain, or palpitations GI: no abdominal pain, diarrhea, constipation, heart burn, nausea or vomiting GU: Denies dysuria, frequency, hematuria or discharge PSYCHIATRIC: Denies feeling of depression or anxiety. No report of hallucinations, insomnia, paranoia, or agitation   PHYSICAL EXAMINATION  GENERAL APPEARANCE: Well nourished. In no acute distress. Obese SKIN:  Skin is dry and warm. HEAD: Normal in size and contour. No evidence of trauma EYES: Lids open and close normally. No blepharitis, entropion or ectropion. PERRL. Conjunctivae are clear and sclerae are white. Lenses are without opacity EARS: Pinnae are normal. Patient hears normal voice tunes of the examiner MOUTH and THROAT: Lips are without lesions. Oral mucosa is moist and without lesions. Tongue is normal in shape, size, and color and without lesions NECK: supple, trachea midline, no neck masses, no thyroid tenderness, no thyromegaly LYMPHATICS: no LAN in the neck, no supraclavicular LAN RESPIRATORY: breathing is even & unlabored, BS CTAB CARDIAC: irregularly irregular, no murmur,no extra heart sounds, no edema GI: abdomen soft, normal BS, no masses,  no tenderness, no hepatomegaly, no splenomegaly EXTREMITIES:  Able to move X 4 extremities; BLE generalized weakness  PSYCHIATRIC: Alert and oriented X 3. Affect and behavior are appropriate  LABS/RADIOLOGY: Labs reviewed: 03/15/16   sodium 144 potassium 3.2  glucose 95 BUN 24 creatinine 0.84 calcium 9.0 GFR >60 02/23/16   Mg 2.2 11/28/15   Na 142  K 3.2  Glucose 103  BUN 32  Creatinine 0.88  Ca 9.0   123456 Basic Metabolic Panel:  Recent Labs  10/16/15 2005      NA 138      K 5.1      CL 91*  CO2 32      GLUCOSE 129*      BUN 42*      CREATININE 1.04*      CALCIUM 9.8      < > = values in this interval not displayed. Liver Function Tests:  Recent Labs  07/11/15 12/24/15  AST 22 19  ALT 17 15  ALKPHOS 77 85    CBC:  Recent Labs  07/11/15 11/24/15 02/23/16  WBC 7.4 6.2 7.1  NEUTROABS 4 3 4   HGB 12.2 10.6* 10.9*  HCT 37 34* 35*  PLT 337 279 308   Lipid Panel:  Recent Labs  08/21/15  HDL 36     ASSESSMENT/PLAN:  Hypokalemia - K 3.2 , low; give KCl ER 20 meq 4 tabs = 80 meq by mouth every 9 AM and 12 PM and 2 tabs = 40 meq Q 5 PM; BMP on [id number]  Chronic diastolic CHF - no SOB; continue Lasix 60 mg BID and Zaroxolyn 2.5 mg BID  COPD  - no wheezing/ SOBcontinue Symbicort 160-4.5 mcg/ACT 2 puffs into the lungs BID and Duoneb 0.5-2.5 mg/53ml 1 neb  Q 4 hours PRN  A-Fib with RVR - rate-controlled; continue Cardizem 180 mg 24 hr 1 capsule daily and Xarelto 20 mg 1 tab by mouth daily  Psychosis - continue Zyprexa 5 mg 1 tab PO Q HS; follows-up with [facility]  Dementia - advanced; continue supportive care     Goals of care:  Long-term rehabilitation   Durenda Age, NP [place] [phone]

## 2016-03-18 LAB — BASIC METABOLIC PANEL WITH GFR
BUN: 31 mg/dL — AB (ref 4–21)
Creatinine: 0.8 mg/dL (ref 0.5–1.1)
Glucose: 94 mg/dL
Potassium: 2.8 mmol/L — AB (ref 3.4–5.3)
Sodium: 143 mmol/L (ref 137–147)

## 2016-03-25 LAB — BASIC METABOLIC PANEL
BUN: 26 mg/dL — AB (ref 4–21)
CREATININE: 0.8 mg/dL (ref 0.5–1.1)
Glucose: 88 mg/dL
Potassium: 3.7 mmol/L (ref 3.4–5.3)
Sodium: 146 mmol/L (ref 137–147)

## 2016-04-16 ENCOUNTER — Non-Acute Institutional Stay (SKILLED_NURSING_FACILITY): Payer: Medicare Other | Admitting: Adult Health

## 2016-04-16 ENCOUNTER — Encounter: Payer: Self-pay | Admitting: Adult Health

## 2016-04-16 DIAGNOSIS — J449 Chronic obstructive pulmonary disease, unspecified: Secondary | ICD-10-CM | POA: Diagnosis not present

## 2016-04-16 DIAGNOSIS — I482 Chronic atrial fibrillation, unspecified: Secondary | ICD-10-CM

## 2016-04-16 DIAGNOSIS — F29 Unspecified psychosis not due to a substance or known physiological condition: Secondary | ICD-10-CM

## 2016-04-16 DIAGNOSIS — I5032 Chronic diastolic (congestive) heart failure: Secondary | ICD-10-CM | POA: Diagnosis not present

## 2016-04-16 DIAGNOSIS — F0390 Unspecified dementia without behavioral disturbance: Secondary | ICD-10-CM

## 2016-04-16 DIAGNOSIS — E876 Hypokalemia: Secondary | ICD-10-CM | POA: Diagnosis not present

## 2016-04-16 DIAGNOSIS — D638 Anemia in other chronic diseases classified elsewhere: Secondary | ICD-10-CM | POA: Diagnosis not present

## 2016-04-16 DIAGNOSIS — K5909 Other constipation: Secondary | ICD-10-CM

## 2016-04-16 NOTE — Progress Notes (Signed)
Patient ID: Jennifer Nielsen, female   DOB: 09/06/1924, 82 y.o.   MRN: [id number]    DATE:     04/16/16  MRN:  [id number]  BIRTHDAY: Aug 15, 1924  Facility:  Nursing Home Location:  [place] Room Number: 1106-A  LEVEL OF CARE:  SNF [phone])  Contact Information    Name Relation Home Work Mobile   Brunsman,Suzanne Daughter [phone]  [phone]   Lanae Boast Daughter [phone]       Chief Complaint  Patient presents with  . Medical Management of Chronic Issues     HISTORY OF PRESENT ILLNESS:  This is an 82 year old female who is being seen for a routine visit. She is a long-term resident of [facility].  Latest K 3.7, normal. She has no SOB. HR has been rate-controlled.   PAST MEDICAL HISTORY:  Past Medical History:  Diagnosis Date  . Acute respiratory failure ([place])   . Anemia of chronic disease   . Arrhythmia   . Arthritis   . Atrial fibrillation ([place])    Remains stable (As of 05/09/13)  . CHF (congestive heart failure) (HCC)    chronic diastolic  . COPD (chronic obstructive pulmonary disease) ([place])    w/exacerbation  . Dementia    Remains stable & continues to function adequately in the current living environment with supervision. Has had little changes in behavior (AS OF 05/09/13)  . Dementia with behavioral disturbance   . GERD (gastroesophageal reflux disease)    Stable (As of 05/09/13)  . H/O blood clots   . Hyperlipemia   . Hypokalemia 02/2016  . Metabolic encephalopathy   . Osteoarthritis    Denies pain (As of 05/09/13)  . Pleural effusion 11/12/2014  . PNA (pneumonia)   . Protein calorie malnutrition ([place])   . Psychosis   . S/P thoracentesis   . Sepsis ([place])   . UTI (lower urinary tract infection)      CURRENT MEDICATIONS: Reviewed  Patient's Medications  New Prescriptions   No medications on file  Previous Medications   ACETAMINOPHEN (TYLENOL) 500 MG TABLET    Take 500 mg by mouth 3  (three) times daily. Do not exceed 4 gms of Tylenol in 24 hours   CALCIUM CARB-CHOLECALCIFEROL (CALCIUM 600/VITAMIN D3 PO)    Take 1 tablet by mouth daily with breakfast.   DILTIAZEM (CARDIZEM CD) 180 MG 24 HR CAPSULE    Take 1 capsule (180 mg total) by mouth daily. For A-Fib   DOCUSATE SODIUM (COLACE) 100 MG CAPSULE    Take 200 mg by mouth daily.   FLUTICASONE-SALMETEROL (ADVAIR HFA) 45-21 MCG/ACT INHALER    Inhale 2 puffs into the lungs 2 (two) times daily.    FUROSEMIDE (LASIX) 20 MG TABLET    Take 40 mg by mouth every evening.    FUROSEMIDE (LASIX) 20 MG TABLET    Take 20 mg by mouth every morning. Take 3 tablets to = 60 mg qam   IPRATROPIUM-ALBUTEROL (DUONEB) 0.5-2.5 (3) MG/3ML SOLN    Take 3 mLs by nebulization every 4 (four) hours as needed. Inhale contents of vial into the mouth via HHN   MAGNESIUM OXIDE (MAG-OX) 400 MG TABLET    Take 400 mg by mouth daily.   METOLAZONE (ZAROXOLYN) 2.5 MG TABLET    Take 1 tablet (2.5 mg total) by mouth 2 (two) times daily.   MULTIPLE VITAMINS-MINERALS (DECUBI-VITE) CAPS    Take 1 capsule by mouth daily.   OLANZAPINE (ZYPREXA) 5 MG  TABLET    Take 5 mg by mouth at bedtime.    POLYETHYL GLYCOL-PROPYL GLYCOL (SYSTANE OP)    Place 1 drop into both eyes 2 (two) times daily. For dry eyes   POTASSIUM CHLORIDE (KLOR-CON) 20 MEQ PACKET    Take 80 mEq by mouth 2 (two) times daily. Take 4 tabs to = 80 mEq   POTASSIUM CHLORIDE SA (K-DUR,KLOR-CON) 20 MEQ TABLET    Take 40 mEq by mouth daily. Take at noon   PROTEIN (PROCEL) POWD    Take 1 scoop by mouth 2 (two) times daily.    RIVAROXABAN (XARELTO) 20 MG TABS TABLET    Take 20 mg by mouth daily with supper.   UNABLE TO FIND    Take 120 mLs by mouth 3 (three) times daily. Med Name: MedPass 120 mL po TID for nutritional support  Modified Medications   No medications on file  Discontinued Medications   No medications on file     Allergies  Allergen Reactions  . Chocolate Itching  . Ciprofloxacin Itching  . Coffee  Bean Extract [Coffea Arabica] Itching  . Coffee Flavor Itching  . Eggs Or Egg-Derived Products   . Gentamycin [Gentamicin] Itching  . Imodium [Loperamide] Other (See Comments)    unknown  . Penicillins Itching  . Sulfa Antibiotics Itching  . Sulfacetamide Sodium Itching  . Tea Itching    REVIEW OF SYSTEMS:  GENERAL: no change in appetite, no fatigue, no weight changes, no fever, chills or weakness EYES: Denies change in vision, dry eyes, eye pain, itching or discharge EARS: Denies change in hearing, ringing in ears, or earache NOSE: Denies nasal congestion or epistaxis MOUTH and THROAT: Denies oral discomfort, gingival pain or bleeding, pain from teeth or hoarseness   RESPIRATORY: no cough, SOB, DOE, wheezing, hemoptysis CARDIAC: no chest pain, or palpitations GI: no abdominal pain, diarrhea, constipation, heart burn, nausea or vomiting GU: Denies dysuria, frequency, hematuria or discharge PSYCHIATRIC: Denies feeling of depression or anxiety. No report of hallucinations, insomnia, paranoia, or agitation   PHYSICAL EXAMINATION  GENERAL APPEARANCE: Well nourished. In no acute distress. Normal body habitus SKIN:  Skin is dry and warm. HEAD: Normal in size and contour. No evidence of trauma EYES: Lids open and close normally. No blepharitis, entropion or ectropion. PERRL. Conjunctivae are clear and sclerae are white. Lenses are without opacity EARS: Pinnae are normal. Patient hears normal voice tunes of the examiner MOUTH and THROAT: Lips are without lesions. Oral mucosa is moist and without lesions. Tongue is normal in shape, size, and color and without lesions NECK: supple, trachea midline, no neck masses, no thyroid tenderness, no thyromegaly LYMPHATICS: no LAN in the neck, no supraclavicular LAN RESPIRATORY: breathing is even & unlabored, BS CTAB CARDIAC: irregularly irregular, no murmur,no extra heart sounds, no edema GI: abdomen soft, normal BS, no masses, no tenderness, no  hepatomegaly, no splenomegaly EXTREMITIES:  Able to move X 4 extremities; BLE generalized weakness  PSYCHIATRIC: Alert and oriented X 3. Affect and behavior are appropriate  LABS/RADIOLOGY: Labs reviewed: 11/28/15   Na 142  K 3.2  Glucose 103  BUN 32  Creatinine 0.88  Ca 9.0   123456 Basic Metabolic Panel:  Recent Labs  10/16/15 2005  03/18/16 03/25/16   NA 138  < > 143 146   K 5.1  < > 2.8* 3.7   CL 91*  --   --   --    CO2 32  --   --   --  GLUCOSE 129*  --   --   --    BUN 42*  < > 31* 26*   CREATININE 1.04*  < > 0.8 0.8   CALCIUM 9.8  --   --   --    < > = values in this interval not displayed. Liver Function Tests:  Recent Labs  07/11/15 12/24/15  AST 22 19  ALT 17 15  ALKPHOS 77 85   CBC:  Recent Labs  07/11/15 11/24/15 02/23/16  WBC 7.4 6.2 7.1  NEUTROABS 4 3 4   HGB 12.2 10.6* 10.9*  HCT 37 34* 35*  PLT 337 279 308   Lipid Panel:  Recent Labs  08/21/15  HDL 36     ASSESSMENT/PLAN:  Anemia of chronic disease - stable Lab Results  Component Value Date   HGB 10.9 (A) 02/23/2016   Dementia - advanced; continue supportive care  Chronic diastolic CHF - no SOB; continue Lasix 60 mg BID and Zaroxolyn 2.5 mg BID  COPD  - no SOB; continue Symbicort 160-4.5 mcg/ACT 2 puffs into the lungs BID and Duoneb 0.5-2.5 mg/63ml 1 neb  Q 4 hours PRN  A-Fib with RVR - rate-controlled; continue Cardizem 180 mg 24 hr 1 capsule daily and Xarelto 20 mg 1 tab by mouth daily  Hypokalemia - continue KCL ER 20 meq take 4 tabs = 80 meq PO  BID  Chronic constipation - continue Colace 200 mg daily  Psychosis - continue Zyprexa 5 mg 1 tab PO Q HS; denies any hallucinations       Goals of care:  Long-term rehabilitation   Durenda Age, NP [place] [phone]

## 2016-05-22 IMAGING — CR DG CHEST 1V PORT
1 series · 1 of 1 positions shown · non-contrast
Comparison: 02/22/2014

CLINICAL DATA: Shortness of breath, weakness, lethargy

EXAM:
PORTABLE CHEST - 1 VIEW

[AP]
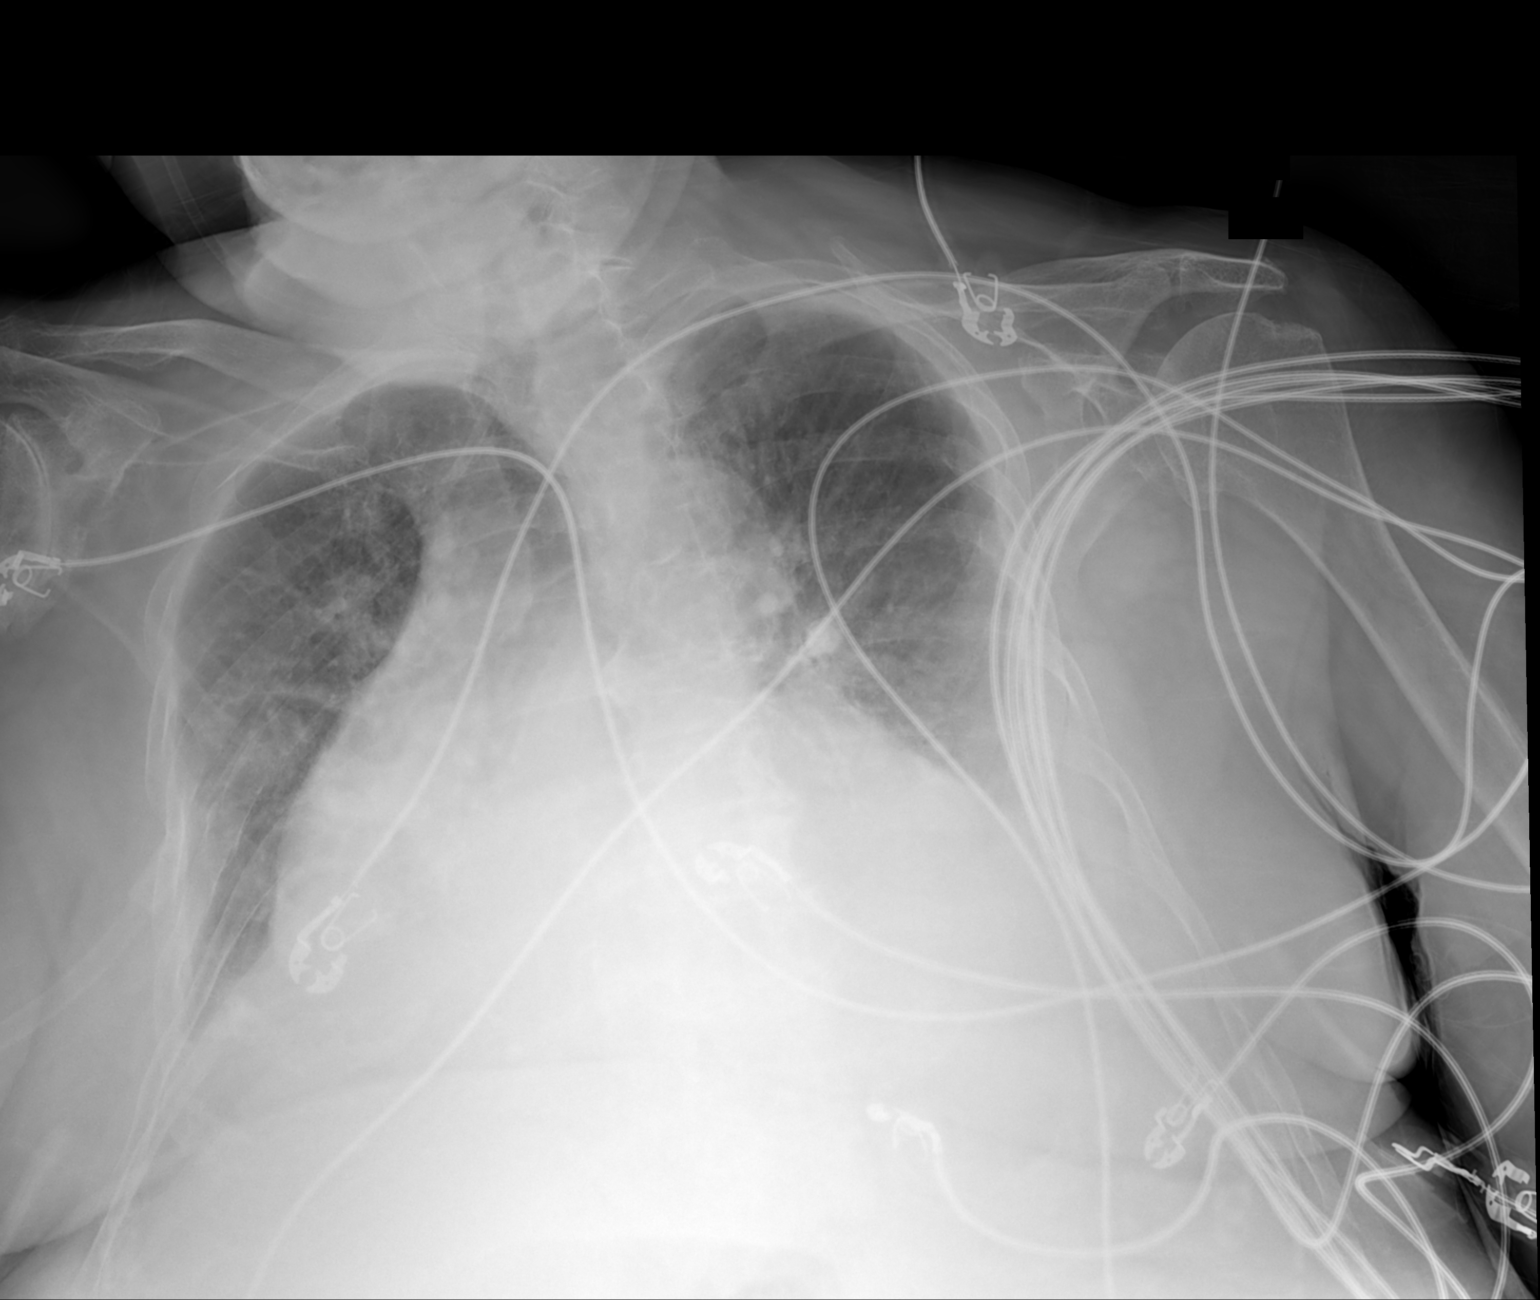

[1 of 1 positions shown; findings below may reference images not displayed]

FINDINGS: Cardiomegaly with pulmonary vascular congestion and possible mild
interstitial edema. Suspected layering left pleural effusion. No
pneumothorax.
IMPRESSION: Cardiomegaly with possible mild interstitial edema.

Suspected layering left pleural effusion.

## 2016-05-27 ENCOUNTER — Non-Acute Institutional Stay (SKILLED_NURSING_FACILITY): Payer: Medicare Other | Admitting: Internal Medicine

## 2016-05-27 ENCOUNTER — Encounter: Payer: Self-pay | Admitting: Internal Medicine

## 2016-05-27 DIAGNOSIS — H04123 Dry eye syndrome of bilateral lacrimal glands: Secondary | ICD-10-CM | POA: Diagnosis not present

## 2016-05-27 DIAGNOSIS — I482 Chronic atrial fibrillation, unspecified: Secondary | ICD-10-CM

## 2016-05-27 DIAGNOSIS — F203 Undifferentiated schizophrenia: Secondary | ICD-10-CM | POA: Diagnosis not present

## 2016-05-27 DIAGNOSIS — J449 Chronic obstructive pulmonary disease, unspecified: Secondary | ICD-10-CM

## 2016-05-27 DIAGNOSIS — I5032 Chronic diastolic (congestive) heart failure: Secondary | ICD-10-CM | POA: Diagnosis not present

## 2016-05-27 DIAGNOSIS — K5909 Other constipation: Secondary | ICD-10-CM | POA: Diagnosis not present

## 2016-05-27 NOTE — Progress Notes (Signed)
Patient ID: Jennifer Nielsen, female   DOB: 11/12/1924, 82 y.o.   MRN: [id number]      [place]  PCP: Blanchie Serve, MD  Code Status: DNR  Allergies  Allergen Reactions  . Chocolate Itching  . Ciprofloxacin Itching  . Coffee Bean Extract [Coffea Arabica] Itching  . Coffee Flavor Itching  . Eggs Or Egg-Derived Products   . Gentamycin [Gentamicin] Itching  . Imodium [Loperamide] Other (See Comments)    unknown  . Penicillins Itching  . Sulfa Antibiotics Itching  . Sulfacetamide Sodium Itching  . Tea Itching    Chief Complaint  Patient presents with  . Medical Management of Chronic Issues    Routine Visit    Advanced Directives 04/16/2016  Does Patient Have a Medical Advance Directive? Yes  Type of Advance Directive Out of facility DNR (pink MOST or yellow form)  Does patient want to make changes to medical advance directive? No - Patient declined  Copy of [place] in Chart? Yes  Would patient like information on creating a medical advance directive? -  Pre-existing out of facility DNR order (yellow form or pink MOST form) -    HPI:  82 y.o. patient is seen for routine visit. She denies any concern this visit. she is out of bed daily and is participating in group activities at the facility. She has been at her baseline per nursing. breathing remains stable.  Review of Systems:  Constitutional: Negative for fever, diaphoresis.  HENT: Negative for headache, congestion Eyes: Negative for double vision and discharge.  Respiratory: Negative for cough, shortness of breath and wheezing.  Cardiovascular: Negative for chest pain, palpitations, leg swelling.  Gastrointestinal: Negative for heartburn, nausea, vomiting, abdominal pain. Had a bowel movement yesterday Genitourinary: Negative for dysuria Musculoskeletal: Negative for falls in facility. Denies pain. Skin: Negative for itching, rash.  Neurological: Negative for  dizziness Psychiatric/Behavioral: Negative for depression    Past Medical History:  Diagnosis Date  . Acute respiratory failure ([place])   . Anemia of chronic disease   . Arrhythmia   . Arthritis   . Atrial fibrillation ([place])    Remains stable (As of 05/09/13)  . CHF (congestive heart failure) (HCC)    chronic diastolic  . COPD (chronic obstructive pulmonary disease) ([place])    w/exacerbation  . Dementia    Remains stable & continues to function adequately in the current living environment with supervision. Has had little changes in behavior (AS OF 05/09/13)  . Dementia with behavioral disturbance   . GERD (gastroesophageal reflux disease)    Stable (As of 05/09/13)  . H/O blood clots   . Hyperlipemia   . Hypokalemia 02/2016  . Metabolic encephalopathy   . Osteoarthritis    Denies pain (As of 05/09/13)  . Pleural effusion 11/12/2014  . PNA (pneumonia)   . Protein calorie malnutrition ([place])   . Psychosis   . S/P thoracentesis   . Sepsis ([place])   . UTI (lower urinary tract infection)    Past Surgical History:  Procedure Laterality Date  . APPENDECTOMY    . GALLBLADDER SURGERY    . HIP SURGERY     Right-Metal plate   . LEG SURGERY     Left leg   Social History:   reports that she has quit smoking. She has never used smokeless tobacco. She reports that she does not drink alcohol or use drugs.  Family History  Problem Relation Age of Onset  . Heart disease Father   .  Breast cancer Daughter   . Colon cancer Neg Hx     Medications:   Medication List       Accurate as of 05/27/16 12:18 PM. Always use your most recent med list.          acetaminophen 500 MG tablet Commonly known as:  TYLENOL Take 500 mg by mouth 3 (three) times daily. Do not exceed 4 gms of Tylenol in 24 hours   CALCIUM 600/VITAMIN D3 PO Take 1 tablet by mouth daily with breakfast.   DECUBI-VITE Caps Take 1 capsule by mouth daily.   diltiazem 180 MG 24 hr capsule Commonly known as:  CARDIZEM  CD Take 1 capsule (180 mg total) by mouth daily. For A-Fib   docusate sodium 100 MG capsule Commonly known as:  COLACE Take 200 mg by mouth daily.   fluticasone-salmeterol 45-21 MCG/ACT inhaler Commonly known as:  ADVAIR HFA Inhale 2 puffs into the lungs 2 (two) times daily.   furosemide 20 MG tablet Commonly known as:  LASIX Take 60 mg by mouth 2 (two) times daily. Take 3 tablets= 60 mg   ipratropium-albuterol 0.5-2.5 (3) MG/3ML Soln Commonly known as:  DUONEB Take 3 mLs by nebulization every 4 (four) hours as needed. Inhale contents of vial into the mouth via HHN   magnesium oxide 400 MG tablet Commonly known as:  MAG-OX Take 400 mg by mouth daily.   metolazone 2.5 MG tablet Commonly known as:  ZAROXOLYN Take 1 tablet (2.5 mg total) by mouth 2 (two) times daily.   OLANZapine 5 MG tablet Commonly known as:  ZYPREXA Take 5 mg by mouth at bedtime.   potassium chloride 20 MEQ packet Commonly known as:  KLOR-CON Take 80 mEq by mouth 3 (three) times daily. Take 4 tabs to = 80 mEq TID   PROCEL Powd Take 1 scoop by mouth 2 (two) times daily.   rivaroxaban 20 MG Tabs tablet Commonly known as:  XARELTO Take 20 mg by mouth daily with supper.   SYSTANE OP Place 1 drop into both eyes 2 (two) times daily. For dry eyes   UNABLE TO FIND Take 120 mLs by mouth 3 (three) times daily. Med Name: MedPass 120 mL po TID for nutritional support        Physical Exam: Vitals:   05/27/16 1212  BP: 127/83  Pulse: 73  Resp: 18  Temp: 97.5 F (36.4 C)  TempSrc: Oral  SpO2: 95%  Weight: 175 lb 6.4 oz (79.6 kg)  Height: 5\' 4"  (1.626 m)  Body mass index is 30.11 kg/m.  General- elderly female, obese, chronically ill appearing, in no acute distress Head- normocephalic, atraumatic Throat- moist mucus membrane Eyes- no pallor, no icterus, no discharge Neck- no cervical lymphadenopathy Cardiovascular- normal s1,s2, no murmur, no leg edema Respiratory- CTAB, no wheeze, no rhonchi,  no crackles, no use of accessory muscles Abdomen- bowel sounds present, soft, non tender Musculoskeletal- generalized weakness in lower extremities, on wheelchair, kyphosis +, gets OOB daily Neurological- alert and oriented to person, place and time Skin- warm and dry Psychiatry- calm with normal affect   Labs reviewed: Basic Metabolic Panel:  Recent Labs  10/16/15 2005  03/11/16 03/18/16 03/25/16  NA 138  < > 144 143 146  K 5.1  < > 3.2* 2.8* 3.7  CL 91*  --   --   --   --   CO2 32  --   --   --   --   GLUCOSE 129*  --   --   --   --  BUN 42*  < > 24* 31* 26*  CREATININE 1.04*  < > 0.8 0.8 0.8  CALCIUM 9.8  --   --   --   --   < > = values in this interval not displayed. Liver Function Tests:  Recent Labs  07/11/15 12/24/15  AST 22 19  ALT 17 15  ALKPHOS 77 85   No results for input(s): LIPASE, AMYLASE in the last 8760 hours. No results for input(s): AMMONIA in the last 8760 hours. CBC:  Recent Labs  07/11/15 11/24/15 02/23/16  WBC 7.4 6.2 7.1  NEUTROABS 4 3 4   HGB 12.2 10.6* 10.9*  HCT 37 34* 35*  PLT 337 279 308      Assessment/Plan  COPD Stable, no recent exacerbation. Continue advair bid and prn duoneb.   Chronic diastolic heart failure Stable. Currently on lasix 60 mg bid with metolazone 2.5 mg bid with kcl 80 meq tid. With her breathing stable and no signs of fluid overload, change lasix to 60 mg in am and 40 mg in pm. Change kcl to 80 meq bid with 40 meq at noon. Check bmp and mg 05/31/16.  Chronic constipation Stable, on colace 200 mg daily  Dry eyes Continue systane eye drop  Schizophrenia Stable behavior, continue her olanzapine, followed by psych service  Chronic atrial fibrillation Rate controlled. continue Cardizem 180 mg daily and Xarelto 20 mg daily     Family/ staff Communication: reviewed care plan with patient and nursing supervisor    Blanchie Serve, MD  [place] [phone] (Monday-Friday 8 am - 5  pm) [phone] (afterhours)

## 2016-05-31 LAB — BASIC METABOLIC PANEL
BUN: 15 mg/dL (ref 4–21)
CREATININE: 0.7 mg/dL (ref 0.5–1.1)
Glucose: 82 mg/dL
POTASSIUM: 4.8 mmol/L (ref 3.4–5.3)
SODIUM: 143 mmol/L (ref 137–147)

## 2016-06-22 ENCOUNTER — Encounter: Payer: Self-pay | Admitting: Adult Health

## 2016-06-22 ENCOUNTER — Non-Acute Institutional Stay (SKILLED_NURSING_FACILITY): Payer: Medicare Other | Admitting: Adult Health

## 2016-06-22 DIAGNOSIS — K5909 Other constipation: Secondary | ICD-10-CM | POA: Diagnosis not present

## 2016-06-22 DIAGNOSIS — E876 Hypokalemia: Secondary | ICD-10-CM

## 2016-06-22 DIAGNOSIS — F01518 Vascular dementia, unspecified severity, with other behavioral disturbance: Secondary | ICD-10-CM

## 2016-06-22 DIAGNOSIS — D638 Anemia in other chronic diseases classified elsewhere: Secondary | ICD-10-CM

## 2016-06-22 DIAGNOSIS — J449 Chronic obstructive pulmonary disease, unspecified: Secondary | ICD-10-CM

## 2016-06-22 DIAGNOSIS — F0151 Vascular dementia with behavioral disturbance: Secondary | ICD-10-CM | POA: Diagnosis not present

## 2016-06-22 DIAGNOSIS — H04123 Dry eye syndrome of bilateral lacrimal glands: Secondary | ICD-10-CM

## 2016-06-22 DIAGNOSIS — I482 Chronic atrial fibrillation, unspecified: Secondary | ICD-10-CM

## 2016-06-22 DIAGNOSIS — F29 Unspecified psychosis not due to a substance or known physiological condition: Secondary | ICD-10-CM

## 2016-06-22 DIAGNOSIS — I5032 Chronic diastolic (congestive) heart failure: Secondary | ICD-10-CM

## 2016-06-22 NOTE — Progress Notes (Signed)
Patient ID: Jennifer Nielsen, female   DOB: Sep 04, 1924, 83 y.o.   MRN: [id number]    DATE:     06/22/16  MRN:  [id number]  BIRTHDAY: [birth year]  Facility:  [facility]ursing Home Location:  [place] Room Number: 1106-A  LEVEL OF CARE:  [phone]  Contact Information    Name Relation Home Work Mobile   Mundo,Suzanne Daughter [phone]  [phone]   Lanae Boast Daughter [phone]       Chief Complaint  Patient presents with  . Medical Management of Chronic Issues     HISTORY OF PRESENT ILLNESS:  This is an 83 year old female who is being seen for a routine visit. She is a long[facility] of [facility]. Lasix dosage was decreased last month. No SOB/edema has been noted. She was seen in the room while in bed. She did not voice any concerns.    PAST MEDICAL HISTORY:  Past Medical History:  Diagnosis Date  . Acute respiratory failure ([place])   . Anemia of chronic disease   . Arrhythmia   . Arthritis   . Atrial fibrillation ([place])    Remains stable (As of 05/09/13)  . CHF (congestive heart failure) (HCC)    chronic diastolic  . COPD (chronic obstructive pulmonary disease) ([place])    w/exacerbation  . Dementia    Remains stable & continues to function adequately in the current living environment with supervision. Has had little changes in behavior (AS OF 05/09/13)  . Dementia with behavioral disturbance   . GERD (gastroesophageal reflux disease)    Stable (As of 05/09/13)  . H/O blood clots   . Hyperlipemia   . Hypokalemia 02/2016  . Metabolic encephalopathy   . Osteoarthritis    Denies pain (As of 05/09/13)  . Pleural effusion 11/12/2014  . PNA (pneumonia)   . Protein calorie malnutrition ([place])   . Psychosis   . S/P thoracentesis   . Sepsis ([place])   . UTI (lower urinary tract infection)      CURRENT MEDICATIONS: Reviewed  Patient's Medications  New Prescriptions   No medications on file  Previous  Medications   ACETAMINOPHEN (TYLENOL) 500 MG TABLET    Take 500 mg by mouth 3 (three) times daily. Do not exceed 4 gms of Tylenol in 24 hours   CALCIUM CARB-CHOLECALCIFEROL (CALCIUM 600/VITAMIN D3 PO)    Take 1 tablet by mouth daily with breakfast.   DILTIAZEM (CARDIZEM CD) 180 MG 24 HR CAPSULE    Take 1 capsule (180 mg total) by mouth daily. For A-Fib   DOCUSATE SODIUM (COLACE) 100 MG CAPSULE    Take 200 mg by mouth daily.   FLUTICASONE-SALMETEROL (ADVAIR HFA) 45-21 MCG/ACT INHALER    Inhale 2 puffs into the lungs 2 (two) times daily.    FUROSEMIDE (LASIX) 20 MG TABLET    Take 40 mg by mouth every evening.    FUROSEMIDE (LASIX) 20 MG TABLET    Take 20 mg by mouth every morning. Take 3 tablets to = 60 mg qam   IPRATROPIUM-ALBUTEROL (DUONEB) 0.5-2.5 (3) MG/3ML SOLN    Take 3 mLs by nebulization every 4 (four) hours as needed. Inhale contents of vial into the mouth via HHN   MAGNESIUM OXIDE (MAG-OX) 400 MG TABLET    Take 400 mg by mouth daily.   METOLAZONE (ZAROXOLYN) 2.5 MG TABLET    Take 1 tablet (2.5 mg total) by mouth 2 (two) times daily.   MULTIPLE VITAMINS-MINERALS (DECUBI-VITE) CAPS  Take 1 capsule by mouth daily.   OLANZAPINE (ZYPREXA) 5 MG TABLET    Take 5 mg by mouth at bedtime.    POLYETHYL GLYCOL-PROPYL GLYCOL (SYSTANE OP)    Place 1 drop into both eyes 2 (two) times daily. For dry eyes   POTASSIUM CHLORIDE (KLOR-CON) 20 MEQ PACKET    Take 80 mEq by mouth 2 (two) times daily. Take 4 tabs to = 80 mEq   POTASSIUM CHLORIDE SA (K-DUR,KLOR-CON) 20 MEQ TABLET    Take 40 mEq by mouth daily. Take at noon   PROTEIN (PROCEL) POWD    Take 1 scoop by mouth 2 (two) times daily.    RIVAROXABAN (XARELTO) 20 MG TABS TABLET    Take 20 mg by mouth daily with supper.   UNABLE TO FIND    Take 120 mLs by mouth 3 (three) times daily. Med Name: MedPass 120 mL po TID for nutritional support  Modified Medications   No medications on file  Discontinued Medications   No medications on file     Allergies   Allergen Reactions  . Chocolate Itching  . Ciprofloxacin Itching  . Coffee Bean Extract [Coffea Arabica] Itching  . Coffee Flavor Itching  . Eggs Or Egg-Derived Products   . Gentamycin [Gentamicin] Itching  . Imodium [Loperamide] Other (See Comments)    unknown  . Penicillins Itching  . Sulfa Antibiotics Itching  . Sulfacetamide Sodium Itching  . Tea Itching    REVIEW OF SYSTEMS:  GENERAL: no change in appetite, no fatigue, no weight changes, no fever, chills or weakness EYES: Denies change in vision, dry eyes, eye pain, itching or discharge EARS: Denies change in hearing, ringing in ears, or earache NOSE: Denies nasal congestion or epistaxis MOUTH and THROAT: Denies oral discomfort, gingival pain or bleeding, pain from teeth or hoarseness   RESPIRATORY: no cough, SOB, DOE, wheezing, hemoptysis CARDIAC: no chest pain, or palpitations GI: no abdominal pain, diarrhea, constipation, heart burn, nausea or vomiting GU: Denies dysuria, frequency, hematuria or discharge PSYCHIATRIC: Denies feeling of depression or anxiety. No report of hallucinations, insomnia, paranoia, or agitation   PHYSICAL EXAMINATION  GENERAL APPEARANCE: Well nourished. In no acute distress. Normal body habitus SKIN:  Skin is dry and warm. HEAD: Normal in size and contour. No evidence of trauma EYES: Lids open and close normally. No blepharitis, entropion or ectropion. PERRL. Conjunctivae are clear and sclerae are white. Lenses are without opacity EARS: Pinnae are normal. Patient hears normal voice tunes of the examiner MOUTH and THROAT: Lips are without lesions. Oral mucosa is moist and without lesions. Tongue is normal in shape, size, and color and without lesions NECK: supple, trachea midline, no neck masses, no thyroid tenderness, no thyromegaly LYMPHATICS: no LAN in the neck, no supraclavicular LAN RESPIRATORY: breathing is even & unlabored, BS CTAB CARDIAC: irregularly irregular, no murmur,no extra  heart sounds, no edema GI: abdomen soft, normal BS, no masses, no tenderness, no hepatomegaly, no splenomegaly EXTREMITIES:  Able to move X 4 extremities; BLE generalized weakness  PSYCHIATRIC: Alert and oriented X 3. Affect and behavior are appropriate  LABS/RADIOLOGY: Labs reviewed: Basic Metabolic Panel:  Recent Labs  10/16/15 2005  03/18/16 03/25/16 05/31/16  NA 138  < > 143 146 143  K 5.1  < > 2.8* 3.7 4.8  CL 91*  --   --   --   --   CO2 32  --   --   --   --   GLUCOSE 129*  --   --   --   --  BUN 42*  < > 31* 26* 15  CREATININE 1.04*  < > 0.8 0.8 0.7  CALCIUM 9.8  --   --   --   --   < > = values in this interval not displayed. Liver Function Tests:  Recent Labs  07/11/15 12/24/15  AST 22 19  ALT 17 15  ALKPHOS 77 85    CBC:  Recent Labs  07/11/15 11/24/15 02/23/16  WBC 7.4 6.2 7.1  NEUTROABS 4 3 4   HGB 12.2 10.6* 10.9*  HCT 37 34* 35*  PLT 337 279 308   Lipid Panel:  Recent Labs  08/21/15  HDL 36      ASSESSMENT/PLAN:  Chronic diastolic CHF - no SOB; continue Lasix 60 mg Q AM and 40 mg Q PM and Zaroxolyn 2.5 mg BID  Hypokalemia - continue KCL ER 20 meq take 4 tabs = 80 meq PO  BID and 40 meq @ 12 noon; check BMP  Anemia of chronic disease - check CBC Lab Results  Component Value Date   HGB 10.9 (A) 02/23/2016   Dementia with behavioral disturbance  - advanced; continue supportive care  COPD  - no SOB; continue Ipratropium-Albuterol 0.5-3(2.5)mg/67ml 1 neb Q 4 hours and Advair HFA 45-21 mcg inhale 2 puffs into the lungs BID    A-Fib with RVR - rate-controlled; continue Cardizem 180 mg 24 hr 1 capsule daily and Xarelto 20 mg 1 tab by mouth daily  Chronic constipation - continue Colace 200 mg daily  Psychosis - continue Zyprexa 5 mg 1 tab PO Q HS; she is being followed-up by [facility] NP  Dry eyes - Continue Systane eyedrops into each eye twice a day      Goals of care:  Long-term rehabilitation   Durenda Age,  NP [place] [phone]

## 2016-06-25 LAB — CBC AND DIFFERENTIAL
HCT: 33 % — AB (ref 36–46)
Hemoglobin: 10.4 g/dL — AB (ref 12.0–16.0)
Neutrophils Absolute: 6 /uL
Platelets: 287 10*3/uL (ref 150–399)
WBC: 7.9 10*3/mL

## 2016-06-25 LAB — BASIC METABOLIC PANEL
BUN: 27 mg/dL — AB (ref 4–21)
Creatinine: 0.8 mg/dL (ref 0.5–1.1)
GLUCOSE: 82 mg/dL
POTASSIUM: 3.6 mmol/L (ref 3.4–5.3)
Sodium: 145 mmol/L (ref 137–147)

## 2016-07-21 IMAGING — US US THORACENTESIS ASP PLEURAL SPACE W/IMG GUIDE
1 series · 4 of 4 positions shown · non-contrast
Comparison: Thoracentesis 09/15/14.

MEDICATIONS:
None

COMPLICATIONS:
None immediate

INDICATION: Symptomatic left sided pleural effusion

EXAM:
US THORACENTESIS ASP PLEURAL SPACE W/IMG GUIDE
TECHNIQUE: Informed written consent was obtained from the patient's family
after a discussion of the risks, benefits and alternatives to
treatment. A timeout was performed prior to the initiation of the
procedure.

[Series 1: us thoracentesis asp pleural space w/img guide · 0.15mm/px · 4 of 4 slices shown]
[im 1/4]
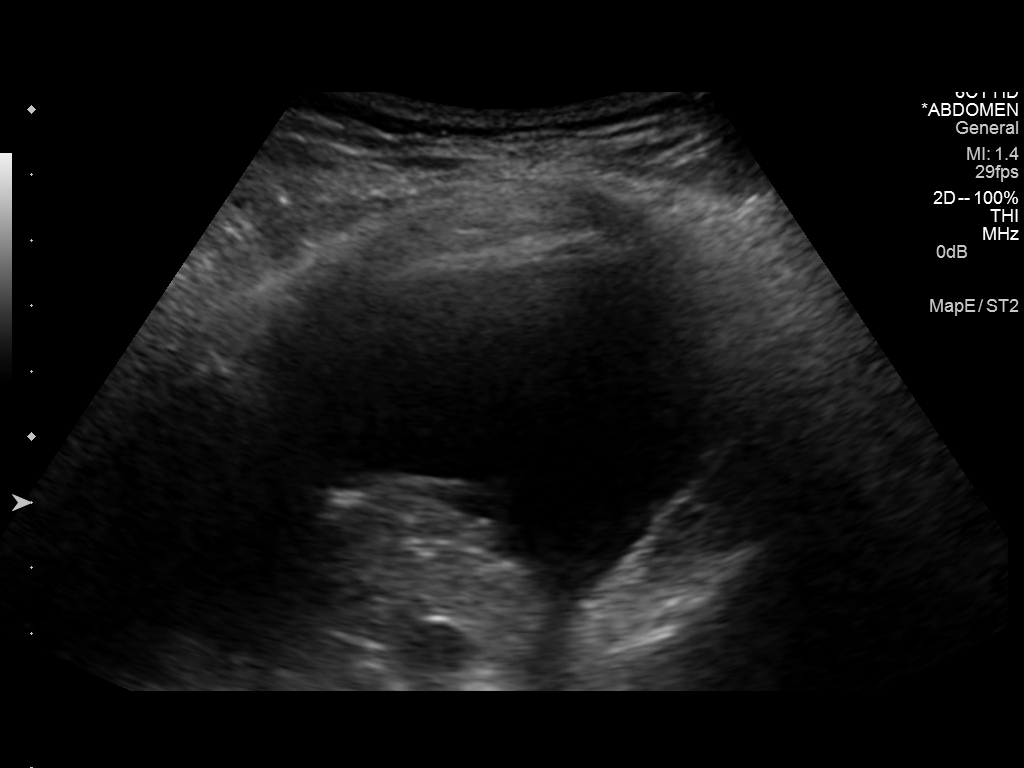
[im 2/4]
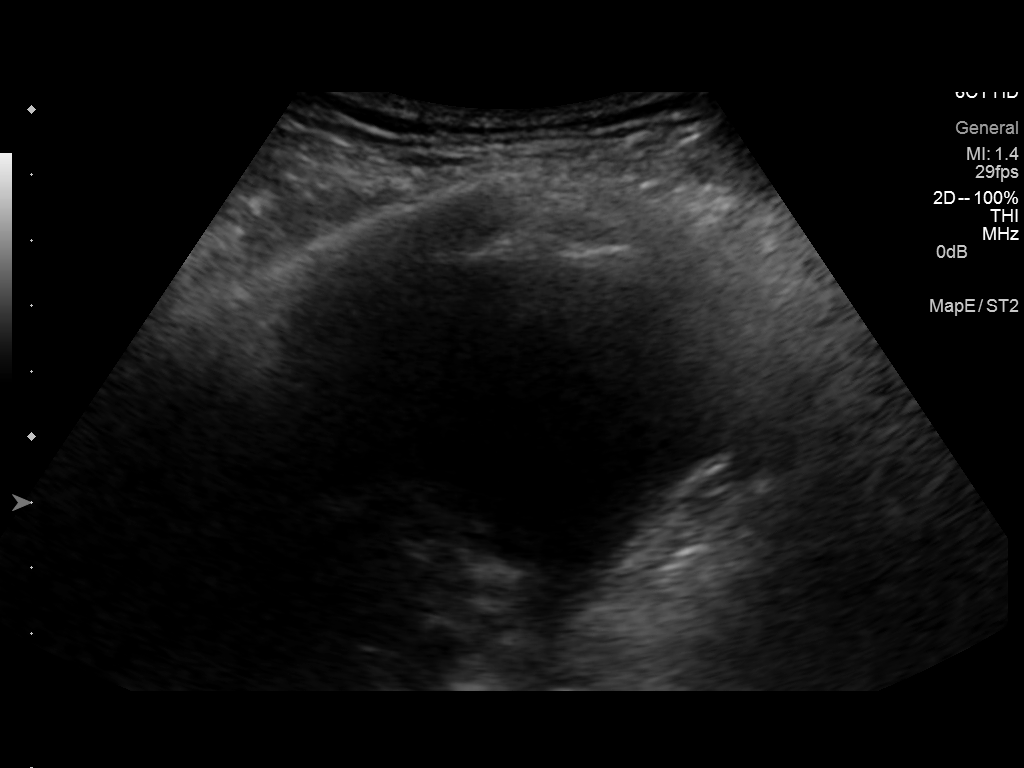
[im 3/4]
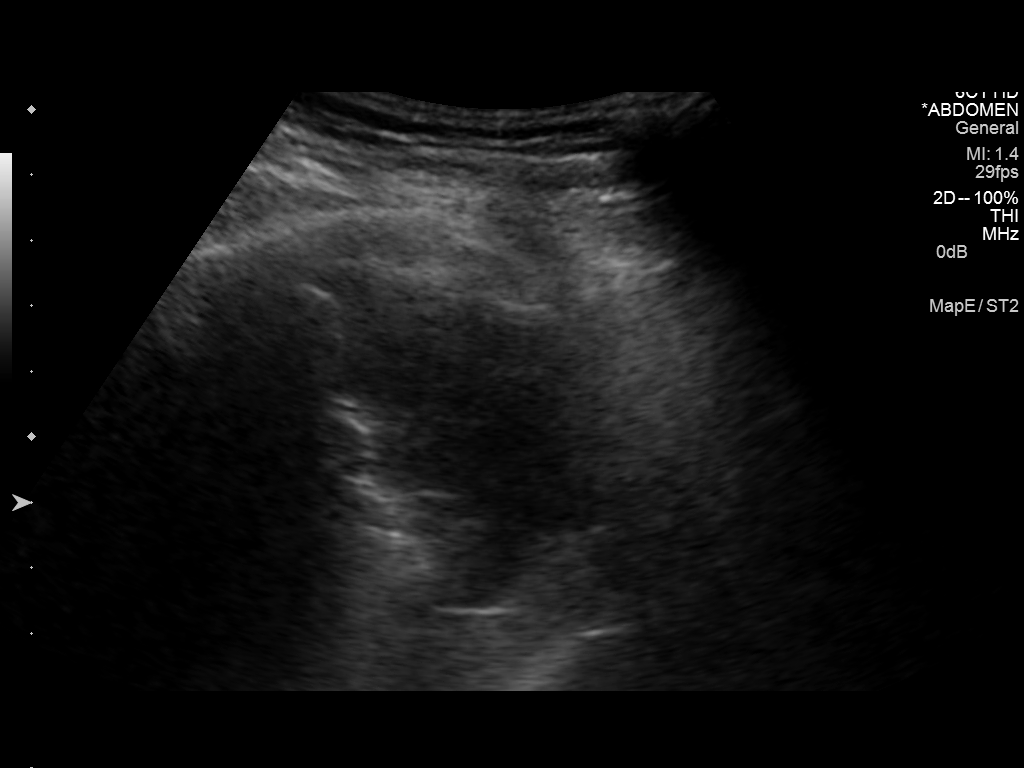
[im 4/4]
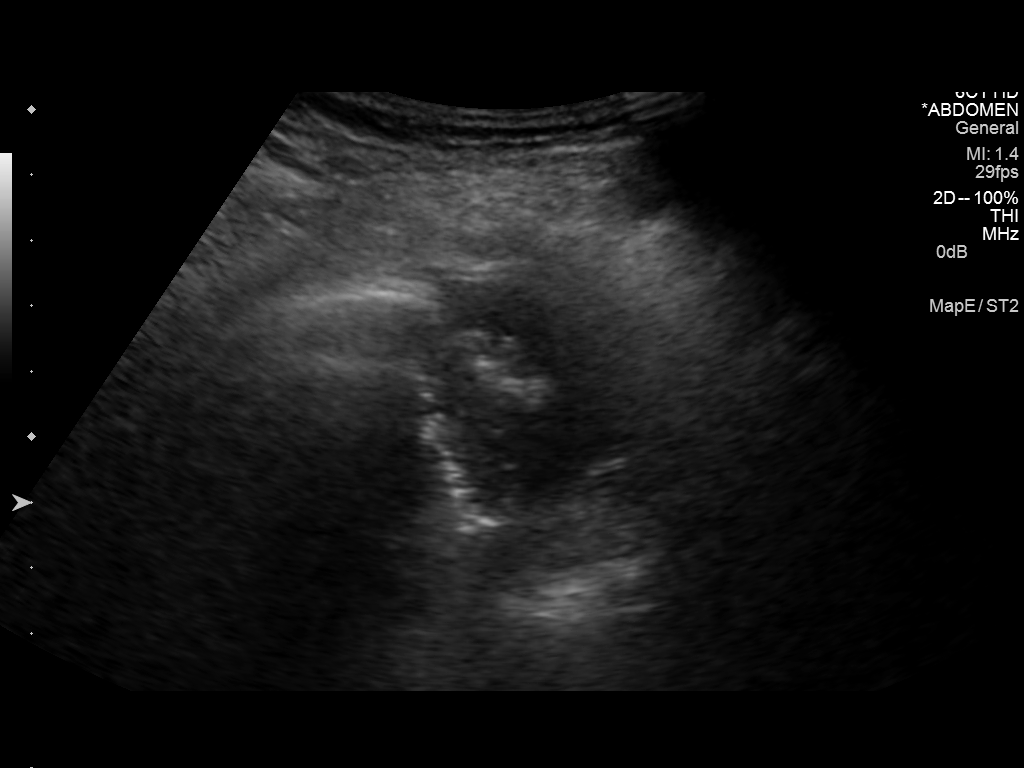

[4 of 4 positions shown; findings below may reference images not displayed]

Initial ultrasound scanning demonstrates a left pleural effusion.
The lower chest was prepped and draped in the usual sterile fashion.
1% lidocaine was used for local anesthesia.

An ultrasound image was saved for documentation purposes. A 6 Fr
Safe-T-Centesis catheter was introduced. The thoracentesis was
performed. The catheter was removed and a dressing was applied. The
patient tolerated the procedure well without immediate post
procedural complication. The patient was escorted to have an upright
chest radiograph.
FINDINGS: A total of approximately 700 ml of serous fluid was removed.
Requested samples were sent to the laboratory.
IMPRESSION: Successful ultrasound-guided left sided thoracentesis yielding 700
ml of pleural fluid.

## 2016-07-22 IMAGING — CR DG CHEST 2V
2 series · 2 of 2 positions shown · non-contrast
Comparison: 10/10/2014

CLINICAL DATA: Dyspnea

EXAM:
CHEST  2 VIEW

[w chest lat]
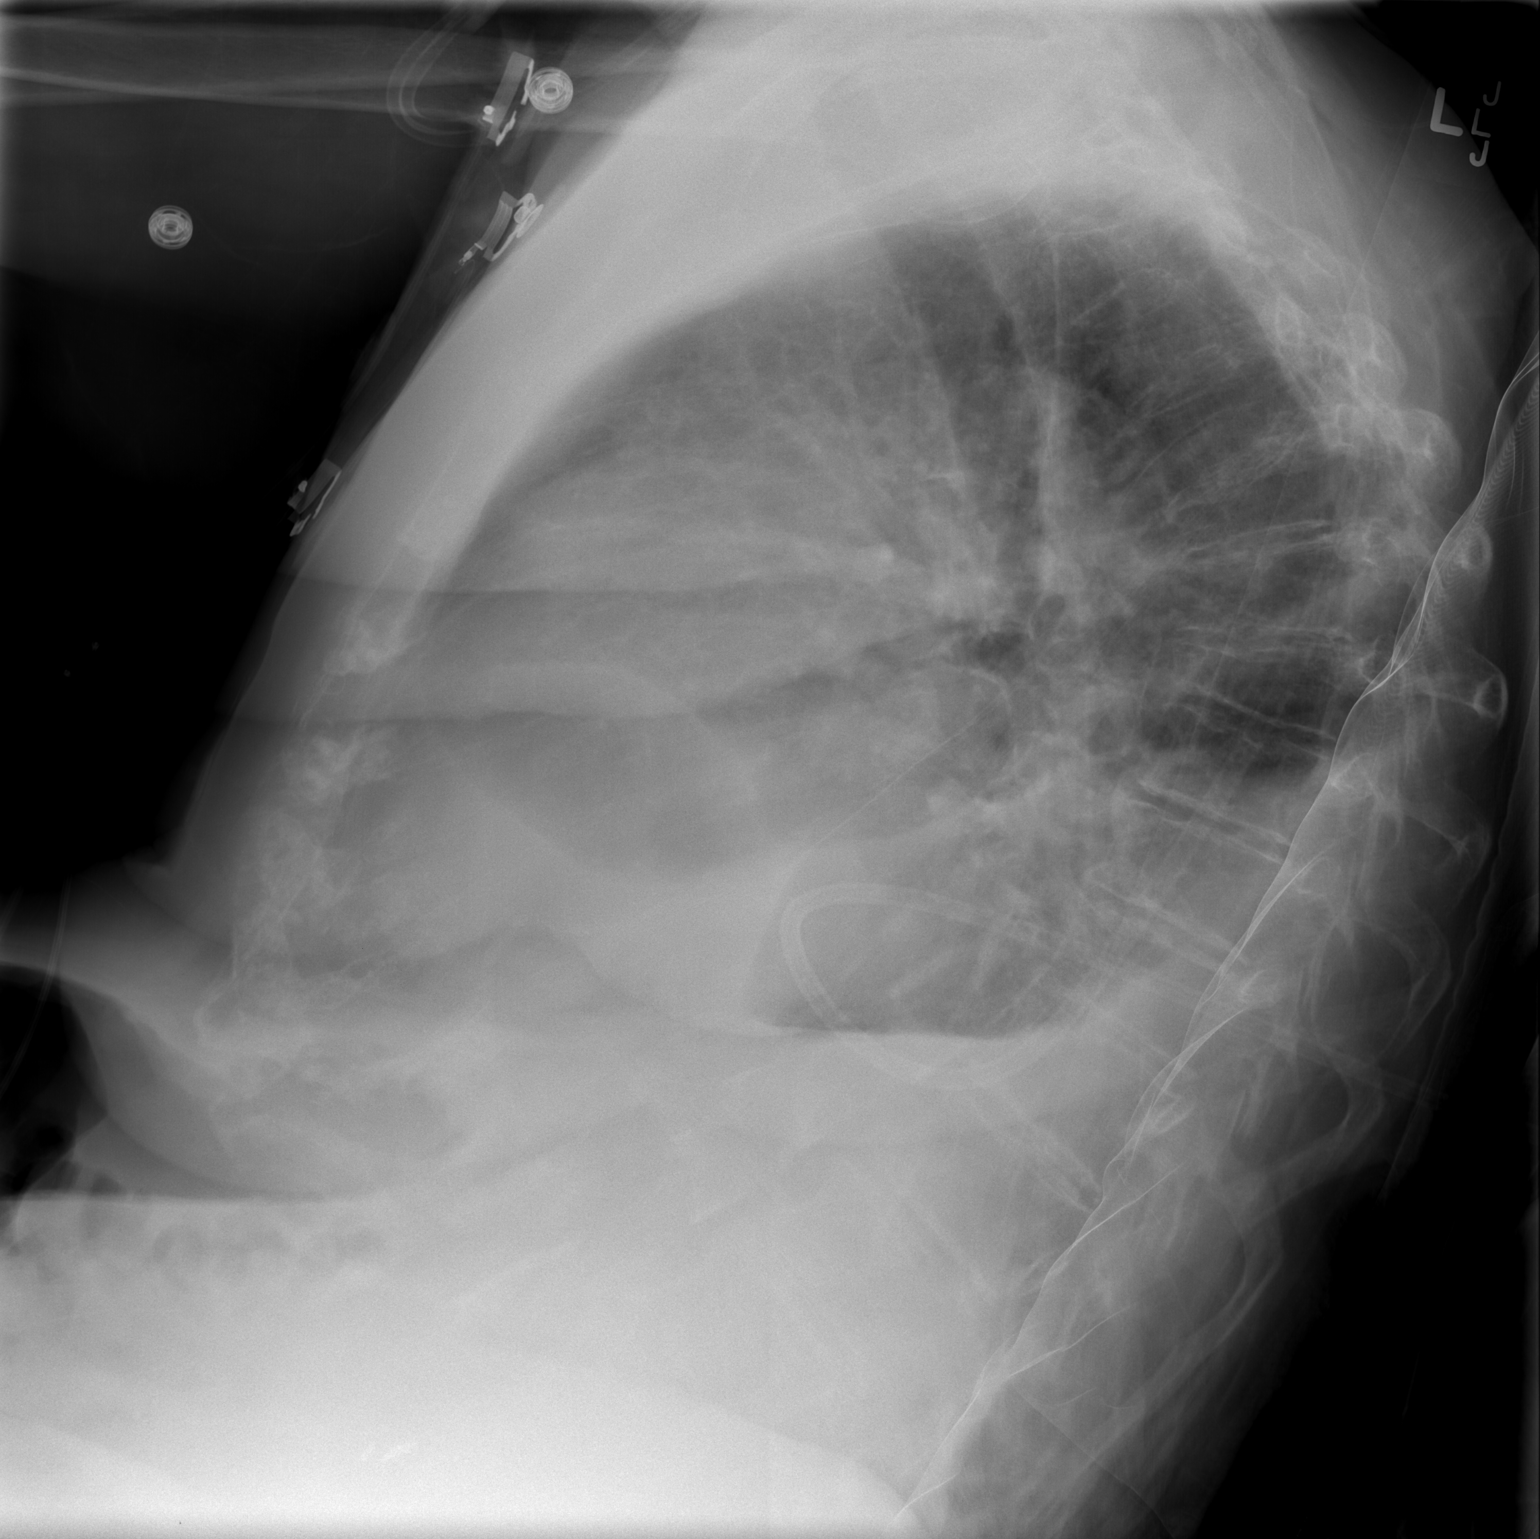

[view not recorded]
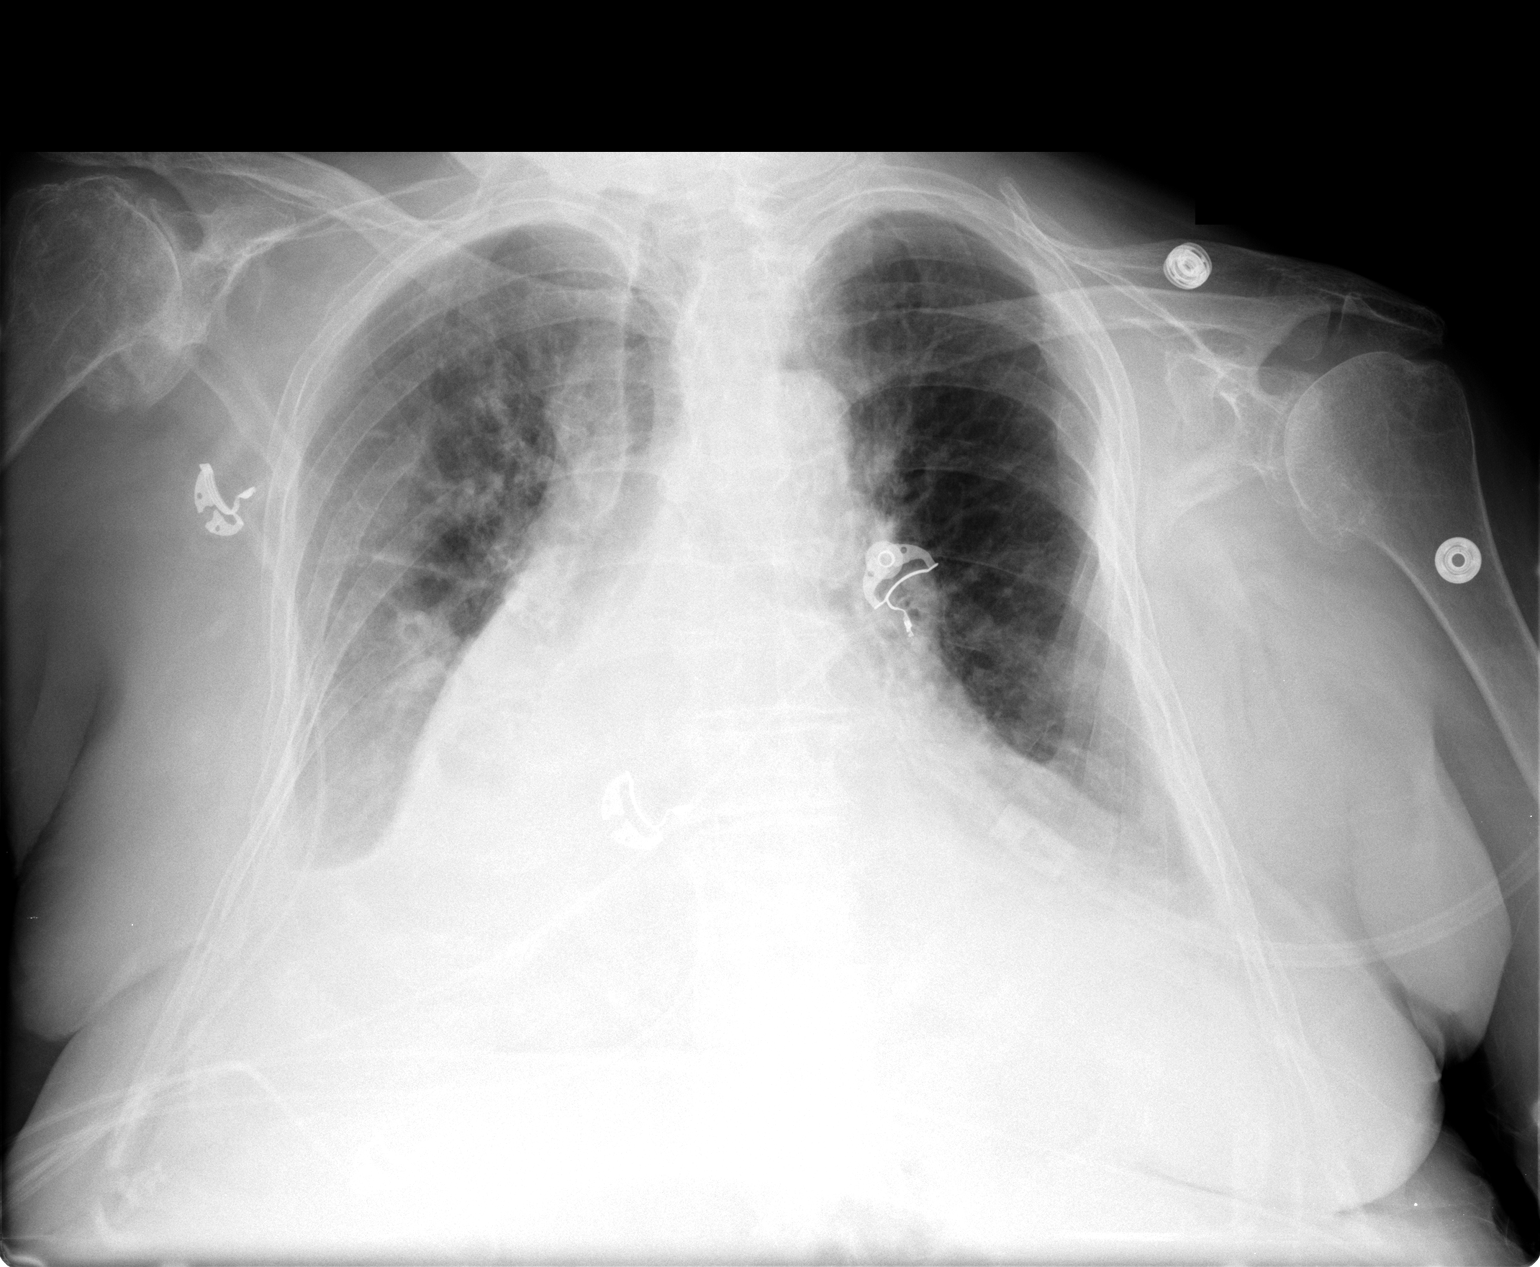

[2 of 2 positions shown; findings below may reference images not displayed]

FINDINGS: There is moderate cardiac enlargement. Persistent bilateral pleural
effusions are noted right greater than left. Mild interstitial edema
is identified. There is advanced osteoarthritis involving the
glenohumeral joints, right greater than left.
IMPRESSION: 1. Cardiac enlargement and bilateral pleural effusions. Unchanged
from previous exam.

## 2016-08-16 ENCOUNTER — Encounter: Payer: Self-pay | Admitting: Adult Health

## 2016-08-16 ENCOUNTER — Non-Acute Institutional Stay (SKILLED_NURSING_FACILITY): Payer: Medicare Other | Admitting: Adult Health

## 2016-08-16 DIAGNOSIS — I5032 Chronic diastolic (congestive) heart failure: Secondary | ICD-10-CM | POA: Diagnosis not present

## 2016-08-16 DIAGNOSIS — K5909 Other constipation: Secondary | ICD-10-CM | POA: Diagnosis not present

## 2016-08-16 DIAGNOSIS — E876 Hypokalemia: Secondary | ICD-10-CM

## 2016-08-16 DIAGNOSIS — I482 Chronic atrial fibrillation, unspecified: Secondary | ICD-10-CM

## 2016-08-16 NOTE — Progress Notes (Signed)
Patient ID: Jennifer Nielsen, female   DOB: 01-Dec-1924, 83 y.o.   MRN: [id number]    DATE:     08/16/2016  MRN:  [id number]  BIRTHDAY: Jul 23, 1924  Facility:  Nursing Home Location:  [place] Room Number: 1106-A  LEVEL OF CARE:  SNF [phone])  Contact Information    Name Relation Home Work Mobile   Lucus,Suzanne Daughter [phone]  [phone]   Lanae Boast Daughter [phone]       Chief Complaint  Patient presents with  . Medical Management of Chronic Issues     HISTORY OF PRESENT ILLNESS:  This is an 83 year old female who is being seen for a routine visit. She is a long-term resident of [facility]. She is currently not on any ACE inhibitor and has CHF. She was recently treated for  Right lung base infiltrate with Zithromax.   PAST MEDICAL HISTORY:  Past Medical History:  Diagnosis Date  . Acute respiratory failure ([place])   . Anemia of chronic disease   . Arrhythmia   . Arthritis   . Atrial fibrillation ([place])    Remains stable (As of 05/09/13)  . CHF (congestive heart failure) (HCC)    chronic diastolic  . COPD (chronic obstructive pulmonary disease) ([place])    w/exacerbation  . Dementia    Remains stable & continues to function adequately in the current living environment with supervision. Has had little changes in behavior (AS OF 05/09/13)  . Dementia with behavioral disturbance   . GERD (gastroesophageal reflux disease)    Stable (As of 05/09/13)  . H/O blood clots   . Hyperlipemia   . Hypokalemia 02/2016  . Metabolic encephalopathy   . Osteoarthritis    Denies pain (As of 05/09/13)  . Pleural effusion 11/12/2014  . PNA (pneumonia)   . Protein calorie malnutrition ([place])   . Psychosis   . S/P thoracentesis   . Sepsis ([place])   . UTI (lower urinary tract infection)      CURRENT MEDICATIONS: Reviewed  Patient's Medications  New Prescriptions   No medications on file  Previous Medications   ACETAMINOPHEN (TYLENOL) 500 MG TABLET    Take 500 mg by mouth 3 (three) times daily. Do not exceed 4 gms of Tylenol in 24 hours   CALCIUM CARB-CHOLECALCIFEROL (CALCIUM 600/VITAMIN D3 PO)    Take 1 tablet by mouth daily with breakfast.   DILTIAZEM (CARDIZEM CD) 180 MG 24 HR CAPSULE    Take 1 capsule (180 mg total) by mouth daily. For A-Fib   DOCUSATE SODIUM (COLACE) 100 MG CAPSULE    Take 200 mg by mouth daily.   FLUTICASONE-SALMETEROL (ADVAIR HFA) 45-21 MCG/ACT INHALER    Inhale 2 puffs into the lungs 2 (two) times daily.    FUROSEMIDE (LASIX) 20 MG TABLET    Take 40 mg by mouth every evening.    FUROSEMIDE (LASIX) 20 MG TABLET    Take 20 mg by mouth every morning. Take 3 tablets to = 60 mg qam   IPRATROPIUM-ALBUTEROL (DUONEB) 0.5-2.5 (3) MG/3ML SOLN    Take 3 mLs by nebulization every 4 (four) hours as needed. Inhale contents of vial into the mouth via HHN   MAGNESIUM OXIDE (MAG-OX) 400 MG TABLET    Take 400 mg by mouth daily.   METOLAZONE (ZAROXOLYN) 2.5 MG TABLET    Take 1 tablet (2.5 mg total) by mouth 2 (two) times daily.   MULTIPLE VITAMINS-MINERALS (DECUBI-VITE) CAPS    Take 1 capsule  by mouth daily.   OLANZAPINE (ZYPREXA) 5 MG TABLET    Take 5 mg by mouth at bedtime.    POLYETHYL GLYCOL-PROPYL GLYCOL (SYSTANE OP)    Place 1 drop into both eyes 2 (two) times daily. For dry eyes   POTASSIUM CHLORIDE (KLOR-CON) 20 MEQ PACKET    Take 80 mEq by mouth 2 (two) times daily. Take 4 tabs to = 80 mEq   POTASSIUM CHLORIDE SA (K-DUR,KLOR-CON) 20 MEQ TABLET    Take 40 mEq by mouth daily. Take at noon   PROTEIN (PROCEL) POWD    Take 1 scoop by mouth 2 (two) times daily.    RIVAROXABAN (XARELTO) 20 MG TABS TABLET    Take 20 mg by mouth daily with supper.  Modified Medications   No medications on file  Discontinued Medications   UNABLE TO FIND    Take 120 mLs by mouth 3 (three) times daily. Med Name: MedPass 120 mL po TID for nutritional support     Allergies  Allergen Reactions  . Chocolate Itching   . Ciprofloxacin Itching  . Coffee Bean Extract [Coffea Arabica] Itching  . Coffee Flavor Itching  . Eggs Or Egg-Derived Products   . Gentamycin [Gentamicin] Itching  . Imodium [Loperamide] Other (See Comments)    unknown  . Penicillins Itching  . Sulfa Antibiotics Itching  . Sulfacetamide Sodium Itching  . Tea Itching    REVIEW OF SYSTEMS:  GENERAL: no change in appetite, no fatigue, no weight changes, no fever, chills or weakness EYES: Denies change in vision, dry eyes, eye pain, itching or discharge EARS: Denies change in hearing, ringing in ears, or earache NOSE: Denies nasal congestion or epistaxis MOUTH and THROAT: Denies oral discomfort, gingival pain or bleeding, pain from teeth or hoarseness   RESPIRATORY: no cough, SOB, DOE, wheezing, hemoptysis CARDIAC: no chest pain, or palpitations GI: no abdominal pain, diarrhea, constipation, heart burn, nausea or vomiting GU: Denies dysuria, frequency, hematuria or discharge PSYCHIATRIC: Denies feeling of depression or anxiety. No report of hallucinations, insomnia, paranoia, or agitation   PHYSICAL EXAMINATION  GENERAL APPEARANCE: Well nourished. In no acute distress. Normal body habitus SKIN:  Skin is dry and warm. HEAD: Normal in size and contour. No evidence of trauma EYES: Lids open and close normally. No blepharitis, entropion or ectropion. PERRL. Conjunctivae are clear and sclerae are white. Lenses are without opacity EARS: Pinnae are normal. Patient hears normal voice tunes of the examiner MOUTH and THROAT: Lips are without lesions. Oral mucosa is moist and without lesions. Tongue is normal in shape, size, and color and without lesions NECK: supple, trachea midline, no neck masses, no thyroid tenderness, no thyromegaly LYMPHATICS: no LAN in the neck, no supraclavicular LAN RESPIRATORY: breathing is even & unlabored, BS CTAB CARDIAC: irregularly irregular, no murmur,no extra heart sounds, no edema GI: abdomen soft,  normal BS, no masses, no tenderness, no hepatomegaly, no splenomegaly EXTREMITIES:  Able to move X 4 extremities; BLE generalized weakness  PSYCHIATRIC: Alert and oriented X 3. Affect and behavior are appropriate    LABS/RADIOLOGY: Labs reviewed: Basic Metabolic Panel:  Recent Labs  10/16/15 2005  03/25/16 05/31/16 06/25/16  NA 138  < > 146 143 145  K 5.1  < > 3.7 4.8 3.6  CL 91*  --   --   --   --   CO2 32  --   --   --   --   GLUCOSE 129*  --   --   --   --  BUN 42*  < > 26* 15 27*  CREATININE 1.04*  < > 0.8 0.7 0.8  CALCIUM 9.8  --   --   --   --   < > = values in this interval not displayed. Liver Function Tests:  Recent Labs  12/24/15  AST 19  ALT 15  ALKPHOS 85    CBC:  Recent Labs  11/24/15 02/23/16 06/25/16  WBC 6.2 7.1 7.9  NEUTROABS 3 4 6   HGB 10.6* 10.9* 10.4*  HCT 34* 35* 33*  PLT 279 308 287     ASSESSMENT/PLAN:   Chronic diastolic CHF - no SOB; continue Lasix 60 mg Q AM and 40 mg Q PM and Zaroxolyn 2.5 mg BID; start Lisinopril 2.5 mg 1 tab by mouth daily; BP/HR twice a day 1 week; CMP in 1 week  Hypokalemia - continue KCl ER 80 meq by mouth twice a day Lab Results  Component Value Date   K 3.6 06/25/2016   Constipation - continue Colace 100 mg 2 capsules by mouth daily  Hypomagnesemia - continue magnesium 400 mg 1 tab by mouth daily  A-Fib with RVR - rate-controlled; continue Cardizem 180 mg 24 hr 1 capsule daily and Xarelto 20 mg 1 tab by mouth daily     Goals of care:  Long-term rehabilitation   Durenda Age, NP [place] [phone]

## 2016-08-23 LAB — BASIC METABOLIC PANEL
BUN: 25 mg/dL — AB (ref 4–21)
CREATININE: 0.8 mg/dL (ref 0.5–1.1)
GLUCOSE: 96 mg/dL
Potassium: 3.3 mmol/L — AB (ref 3.4–5.3)
SODIUM: 138 mmol/L (ref 137–147)

## 2016-08-23 LAB — HEPATIC FUNCTION PANEL
ALT: 14 U/L (ref 7–35)
AST: 18 U/L (ref 13–35)
Alkaline Phosphatase: 100 U/L (ref 25–125)
Bilirubin, Total: 0.4 mg/dL

## 2016-09-06 LAB — BASIC METABOLIC PANEL
BUN: 43 mg/dL — AB (ref 4–21)
Creatinine: 1 mg/dL (ref 0.5–1.1)
GLUCOSE: 88 mg/dL
Potassium: 5.3 mmol/L (ref 3.4–5.3)
SODIUM: 140 mmol/L (ref 137–147)

## 2016-09-09 LAB — BASIC METABOLIC PANEL WITH GFR
BUN: 28 mg/dL — AB (ref 4–21)
Creatinine: 0.8 mg/dL (ref 0.5–1.1)
Glucose: 83 mg/dL
Potassium: 4.9 mmol/L (ref 3.4–5.3)
Sodium: 140 mmol/L (ref 137–147)

## 2016-09-21 ENCOUNTER — Non-Acute Institutional Stay (SKILLED_NURSING_FACILITY): Payer: Medicare Other | Admitting: Adult Health

## 2016-09-21 ENCOUNTER — Encounter: Payer: Self-pay | Admitting: Adult Health

## 2016-09-21 DIAGNOSIS — E876 Hypokalemia: Secondary | ICD-10-CM

## 2016-09-21 DIAGNOSIS — F0151 Vascular dementia with behavioral disturbance: Secondary | ICD-10-CM | POA: Diagnosis not present

## 2016-09-21 DIAGNOSIS — J449 Chronic obstructive pulmonary disease, unspecified: Secondary | ICD-10-CM

## 2016-09-21 DIAGNOSIS — D638 Anemia in other chronic diseases classified elsewhere: Secondary | ICD-10-CM

## 2016-09-21 DIAGNOSIS — F01518 Vascular dementia, unspecified severity, with other behavioral disturbance: Secondary | ICD-10-CM

## 2016-09-21 NOTE — Progress Notes (Signed)
DATE:  09/21/2016   MRN:  [id number]  BIRTHDAY: 20-Aug-1924  Facility:  Nursing Home Location:  [place] Room Number: 1106-A  LEVEL OF CARE:  SNF [phone])  Contact Information    Name Relation Home Work Mobile   Harvill,Suzanne Daughter [phone]  [phone]   Griffin,Martha Daughter [phone]       Code Status History    Date Active Date Inactive Code Status Order ID Comments User Context   01/20/2015  3:44 PM 01/28/2015  4:16 PM DNR [id number]  Albertine Patricia, MD Inpatient   01/20/2015  1:21 PM 01/20/2015  3:44 PM DNR [id number]  Erick Colace, NP ED   12/28/2014  9:12 PM 01/02/2015  5:23 PM DNR [id number]  Kelvin Cellar, MD Inpatient   11/07/2014  1:54 PM 11/20/2014  4:30 PM DNR [id number]  Florencia Reasons, MD Inpatient   10/09/2014  5:28 PM 10/12/2014  6:45 PM DNR [id number]  Doran Clay, DO Inpatient   10/09/2014  4:34 PM 10/09/2014  5:28 PM Full Code [id number]  Robbie Lis, MD Inpatient   09/15/2014  2:15 AM 09/18/2014  5:08 PM DNR [id number]  Theressa Millard, MD Inpatient   09/15/2014  1:51 AM 09/15/2014  2:15 AM DNR [id number]  Theressa Millard, MD ED   08/11/2014 10:28 PM 08/20/2014  4:30 PM DNR [id number]  Berle Mull, MD ED   09/19/2013  5:52 PM 09/25/2013  4:25 PM DNR [id number]  Domenic Polite, MD Inpatient   09/19/2013  4:09 PM 09/19/2013  5:52 PM DNR [id number]  Domenic Polite, MD ED   06/25/2013  2:06 PM 07/02/2013  6:48 PM DNR [id number]  Geradine Girt, DO ED    Questions for Most Recent Historical Code Status (Order [id number])    Question Answer Comment   In the event of cardiac or respiratory ARREST Do not call a "code blue"    In the event of cardiac or respiratory ARREST Do not perform Intubation, CPR, defibrillation or ACLS    In the event of cardiac or respiratory ARREST Use medication by any route, position, wound care, and other measures to relive pain and suffering. May use oxygen, suction and manual treatment of airway  obstruction as needed for comfort.         Advance Directive Documentation     Most Recent Value  Type of Advance Directive  Out of facility DNR (pink MOST or yellow form)  Pre-existing out of facility DNR order (yellow form or pink MOST form)  -  "MOST" Form in Place?  -       Chief Complaint  Patient presents with  . Medical Management of Chronic Issues    HISTORY OF PRESENT ILLNESS:  This is a 89-YO female seen for a routine visit.  She is a long-term care resident at [facility]. She was supplemented with KCL and latest K 4.9. She is seen in her room today and did not verbalize any concerns.    PAST MEDICAL HISTORY:  Past Medical History:  Diagnosis Date  . Acute respiratory failure ([place])   . Anemia of chronic disease   . Arrhythmia   . Arthritis   . Atrial fibrillation ([place])    Remains stable (As of 05/09/13)  . CHF (congestive heart failure) (HCC)    chronic diastolic  . COPD (chronic obstructive pulmonary disease) ([place])    w/exacerbation  . Dementia    Remains stable & continues  to function adequately in the current living environment with supervision. Has had little changes in behavior (AS OF 05/09/13)  . Dementia with behavioral disturbance   . GERD (gastroesophageal reflux disease)    Stable (As of 05/09/13)  . H/O blood clots   . Hyperlipemia   . Hypokalemia 02/2016  . Metabolic encephalopathy   . Osteoarthritis    Denies pain (As of 05/09/13)  . Pleural effusion 11/12/2014  . PNA (pneumonia)   . Protein calorie malnutrition ([place])   . Psychosis   . S/P thoracentesis   . Sepsis ([place])   . UTI (lower urinary tract infection)      CURRENT MEDICATIONS: Reviewed  Patient's Medications  New Prescriptions   No medications on file  Previous Medications   ACETAMINOPHEN (TYLENOL) 500 MG TABLET    Take 500 mg by mouth 3 (three) times daily. Do not exceed 4 gms of Tylenol in 24 hours   CALCIUM CARB-CHOLECALCIFEROL (CALCIUM-VITAMIN D)  500-200 MG-UNIT TABLET    Take 1 tablet by mouth daily.   DILTIAZEM (CARDIZEM CD) 180 MG 24 HR CAPSULE    Take 1 capsule (180 mg total) by mouth daily. For A-Fib   DOCUSATE SODIUM (COLACE) 100 MG CAPSULE    Take 200 mg by mouth daily.    FLUTICASONE-SALMETEROL (ADVAIR HFA) 45-21 MCG/ACT INHALER    Inhale 2 puffs into the lungs 2 (two) times daily.    FUROSEMIDE (LASIX) 20 MG TABLET    Take 40 mg by mouth every evening.    FUROSEMIDE (LASIX) 20 MG TABLET    Take 20 mg by mouth every morning. Take 3 tablets to = 60 mg qam   IPRATROPIUM-ALBUTEROL (DUONEB) 0.5-2.5 (3) MG/3ML SOLN    Take 3 mLs by nebulization every 4 (four) hours as needed. Inhale contents of vial into the mouth via HHN   LISINOPRIL (PRINIVIL,ZESTRIL) 2.5 MG TABLET    Take 2.5 mg by mouth daily.   MAGNESIUM OXIDE (MAG-OX) 400 MG TABLET    Take 400 mg by mouth daily.   METOLAZONE (ZAROXOLYN) 2.5 MG TABLET    Take 1 tablet (2.5 mg total) by mouth 2 (two) times daily.   MULTIPLE VITAMINS-MINERALS (DECUBI-VITE) CAPS    Take 1 capsule by mouth daily.   OLANZAPINE (ZYPREXA) 5 MG TABLET    Take 5 mg by mouth at bedtime.    POLYETHYL GLYCOL-PROPYL GLYCOL (SYSTANE OP)    Place 1 drop into both eyes 2 (two) times daily. For dry eyes   POTASSIUM CHLORIDE (KLOR-CON) 20 MEQ PACKET    Take 40 mEq by mouth daily at 12 noon.    POTASSIUM CHLORIDE SA (K-DUR,KLOR-CON) 20 MEQ TABLET    Take 80 mEq by mouth 2 (two) times daily. Take at noon    PROTEIN (PROCEL) POWD    Take 1 scoop by mouth 2 (two) times daily.    RIVAROXABAN (XARELTO) 20 MG TABS TABLET    Take 20 mg by mouth daily with supper.  Modified Medications   No medications on file  Discontinued Medications   CALCIUM CARB-CHOLECALCIFEROL (CALCIUM 600/VITAMIN D3 PO)    Take 1 tablet by mouth daily with breakfast.     Allergies  Allergen Reactions  . Chocolate Itching  . Ciprofloxacin Itching  . Coffee Bean Extract [Coffea Arabica] Itching  . Coffee Flavor Itching  . Eggs Or Egg-Derived  Products   . Gentamycin [Gentamicin] Itching  . Imodium [Loperamide] Other (See Comments)    unknown  . Penicillins Itching  .  Sulfa Antibiotics Itching  . Sulfacetamide Sodium Itching  . Tea Itching     REVIEW OF SYSTEMS:  GENERAL: no change in appetite, no fatigue, no weight changes, no fever, chills or weakness EYES: Denies change in vision, dry eyes, eye pain, itching or discharge EARS: Denies change in hearing, ringing in ears, or earache NOSE: Denies nasal congestion or epistaxis MOUTH and THROAT: Denies oral discomfort, gingival pain or bleeding, pain from teeth or hoarseness   RESPIRATORY: no cough, SOB, DOE, wheezing, hemoptysis CARDIAC: no chest pain, edema or palpitations GI: no abdominal pain, diarrhea, constipation, heart burn, nausea or vomiting GU: Denies dysuria, frequency, hematuria, incontinence, or discharge PSYCHIATRIC: Denies feeling of depression or anxiety. No report of hallucinations, insomnia, paranoia, or agitation    PHYSICAL EXAMINATION  GENERAL APPEARANCE: Well nourished. In no acute distress. Obese SKIN:  Skin is warm and dry.  HEAD: Normal in size and contour. No evidence of trauma EYES: Lids open and close normally. No blepharitis, entropion or ectropion. PERRL. Conjunctivae are clear and sclerae are white. Lenses are without opacity EARS: Pinnae are normal. Patient hears normal voice tunes of the examiner MOUTH and THROAT: Lips are without lesions. Oral mucosa is moist and without lesions. Tongue is normal in shape, size, and color and without lesions NECK: supple, trachea midline, no neck masses, no thyroid tenderness, no thyromegaly LYMPHATICS: no LAN in the neck, no supraclavicular LAN RESPIRATORY: breathing is even & unlabored, BS CTAB CARDIAC: irregularly irregular,  no murmur,no extra heart sounds, no edema GI: abdomen soft, normal BS, no masses, no tenderness, no hepatomegaly, no splenomegaly EXTREMITIES:  Able to move X 4 extremities,  BLE generalized weakness PSYCHIATRIC: Alert and oriented X 3. Affect and behavior are appropriate   LABS/RADIOLOGY: Labs reviewed: 09/22/16  Wbc 5.2  hgb 9.4  hct 30.3  mcv  84.6  Platelet 368  Na 140  K 3.9  Glucose 87  BUN 26  Creatinine 0.89  Ca 9.0  GFR >53 Basic Metabolic Panel:  Recent Labs  10/16/15 2005  08/23/16 09/06/16 09/09/16  NA 138  < > 138 140 140  K 5.1  < > 3.3* 5.3 4.9  CL 91*  --   --   --   --   CO2 32  --   --   --   --   GLUCOSE 129*  --   --   --   --   BUN 42*  < > 25* 43* 28*  CREATININE 1.04*  < > 0.8 1.0 0.8  CALCIUM 9.8  --   --   --   --   < > = values in this interval not displayed. Liver Function Tests:  Recent Labs  12/24/15 08/23/16  AST 19 18  ALT 15 14  ALKPHOS 85 100   CBC:  Recent Labs  11/24/15 02/23/16 06/25/16  WBC 6.2 7.1 7.9  NEUTROABS 3 4 6   HGB 10.6* 10.9* 10.4*  HCT 34* 35* 33*  PLT 279 308 287    ASSESSMENT/PLAN:  Anemia of chronic disease - hgb 9.4 , check  CBC  COPD - no SOB; continue Advair HFA 45-21 mcg inhaler inhale 2 puffs into the lungs BID and Ipratropium-albuterol Q 4 hours PRN   Vascular dementia with behavioral disturbance - continue supportive care, Olanzepine 5 mg 1 tab PO Q HS; fall precautions  Hypokalemia - K 3.9, continue KCL ER 20 meq 2 tabs = 40 meq PO Q 12 PM and  80 meq PO  BID; will monitor     Goals of care:  Long-term care    Mana Haberl C. Newton - NP    [employer] [phone]

## 2016-09-22 LAB — CBC AND DIFFERENTIAL
HEMATOCRIT: 30 % — AB (ref 36–46)
Hemoglobin: 9.4 g/dL — AB (ref 12.0–16.0)
PLATELETS: 368 10*3/uL (ref 150–399)
WBC: 5.2 10*3/mL

## 2016-09-22 LAB — BASIC METABOLIC PANEL
BUN: 26 mg/dL — AB (ref 4–21)
Creatinine: 0.9 mg/dL (ref 0.5–1.1)
Glucose: 87 mg/dL
Potassium: 3.9 mmol/L (ref 3.4–5.3)
Sodium: 140 mmol/L (ref 137–147)

## 2016-11-02 ENCOUNTER — Non-Acute Institutional Stay (SKILLED_NURSING_FACILITY): Payer: Medicare Other | Admitting: Internal Medicine

## 2016-11-02 ENCOUNTER — Encounter: Payer: Self-pay | Admitting: Internal Medicine

## 2016-11-02 DIAGNOSIS — F203 Undifferentiated schizophrenia: Secondary | ICD-10-CM

## 2016-11-02 DIAGNOSIS — E876 Hypokalemia: Secondary | ICD-10-CM

## 2016-11-02 DIAGNOSIS — I5032 Chronic diastolic (congestive) heart failure: Secondary | ICD-10-CM | POA: Diagnosis not present

## 2016-11-02 DIAGNOSIS — I482 Chronic atrial fibrillation, unspecified: Secondary | ICD-10-CM

## 2016-11-02 DIAGNOSIS — M19011 Primary osteoarthritis, right shoulder: Secondary | ICD-10-CM | POA: Diagnosis not present

## 2016-11-02 DIAGNOSIS — J449 Chronic obstructive pulmonary disease, unspecified: Secondary | ICD-10-CM

## 2016-11-02 NOTE — Progress Notes (Signed)
Patient ID: Jennifer Nielsen, female   DOB: Aug 05, 1924, 83 y.o.   MRN: [id number]      [place]  PCP: Blanchie Serve, MD  Code Status: DNR  Allergies  Allergen Reactions  . Chocolate Itching  . Ciprofloxacin Itching  . Coffee Bean Extract [Coffea Arabica] Itching  . Coffee Flavor Itching  . Eggs Or Egg-Derived Products   . Gentamycin [Gentamicin] Itching  . Imodium [Loperamide] Other (See Comments)    unknown  . Penicillins Itching  . Sulfa Antibiotics Itching  . Sulfacetamide Sodium Itching  . Tea Itching    Chief Complaint  Patient presents with  . Medical Management of Chronic Issues    Routine Visit     Advanced Directives 09/21/2016  Does Patient Have a Medical Advance Directive? Yes  Type of Advance Directive Out of facility DNR (pink MOST or yellow form)  Does patient want to make changes to medical advance directive? No - Patient declined  Copy of Reading in Chart? -  Would patient like information on creating a medical advance directive? -  Pre-existing out of facility DNR order (yellow form or pink MOST form) -    HPI:  83 y.o. patient is seen for routine visit. She denies any concern this visit. She is out of bed daily and wheels herself around. She is participating in group activities at the facility. She has been at her baseline per nursing.   Review of Systems:  Constitutional: Negative for fever, diaphoresis.  HENT: Negative for headache, congestion Eyes: Negative for double vision and discharge.  Respiratory: Negative for shortness of breath and wheezing. Positive for occasional cough Cardiovascular: Negative for chest pain, palpitations.  Gastrointestinal: Negative for heartburn, nausea, vomiting, abdominal pain. Had a bowel movement yesterday Genitourinary: Negative for dysuria Musculoskeletal: Negative for falls in facility. Denies pain. Skin: Negative for itching, rash.  Neurological: Negative for  dizziness Psychiatric/Behavioral: Negative for depression    Past Medical History:  Diagnosis Date  . Acute respiratory failure ([place])   . Anemia of chronic disease   . Arrhythmia   . Arthritis   . Atrial fibrillation ([place])    Remains stable (As of 05/09/13)  . CHF (congestive heart failure) ([facility])    chronic diastolic  . COPD (chronic obstructive pulmonary disease) ([place])    w/exacerbation  . Dementia    Remains stable & continues to function adequately in the current living environment with supervision. Has had little changes in behavior (AS OF 05/09/13)  . Dementia with behavioral disturbance   . GERD (gastroesophageal reflux disease)    Stable (As of 05/09/13)  . H/O blood clots   . Hyperlipemia   . Hypokalemia 02/2016  . Metabolic encephalopathy   . Osteoarthritis    Denies pain (As of 05/09/13)  . Pleural effusion 11/12/2014  . PNA (pneumonia)   . Protein calorie malnutrition ([place])   . Psychosis   . S/P thoracentesis   . Sepsis ([place])   . UTI (lower urinary tract infection)    Past Surgical History:  Procedure Laterality Date  . APPENDECTOMY    . GALLBLADDER SURGERY    . HIP SURGERY     Right-Metal plate   . LEG SURGERY     Left leg   Social History:   reports that she has quit smoking. She has never used smokeless tobacco. She reports that she does not drink alcohol or use drugs.  Family History  Problem Relation Age of Onset  .  Heart disease Father   . Breast cancer Daughter   . Colon cancer Neg Hx     Medications: Allergies as of 11/02/2016      Reactions   Chocolate Itching   Ciprofloxacin Itching   Coffee Bean Extract [coffea Arabica] Itching   Coffee Flavor Itching   Eggs Or Egg-derived Products    Gentamycin [gentamicin] Itching   Imodium [loperamide] Other (See Comments)   unknown   Penicillins Itching   Sulfa Antibiotics Itching   Sulfacetamide Sodium Itching   Tea Itching      Medication List       Accurate as of 11/02/16  2:05 PM.  Always use your most recent med list.          acetaminophen 500 MG tablet Commonly known as:  TYLENOL Take 500 mg by mouth 3 (three) times daily. Do not exceed 4 gms of Tylenol in 24 hours   calcium-vitamin D 500-200 MG-UNIT tablet Take 1 tablet by mouth daily.   DECUBI-VITE Caps Take 1 capsule by mouth daily.   diltiazem 180 MG 24 hr capsule Commonly known as:  CARDIZEM CD Take 1 capsule (180 mg total) by mouth daily. For A-Fib   docusate sodium 100 MG capsule Commonly known as:  COLACE Take 200 mg by mouth daily.   fluticasone-salmeterol 45-21 MCG/ACT inhaler Commonly known as:  ADVAIR HFA Inhale 2 puffs into the lungs 2 (two) times daily.   furosemide 20 MG tablet Commonly known as:  LASIX Take 40 mg by mouth every evening.   furosemide 20 MG tablet Commonly known as:  LASIX Take 60 mg by mouth every morning.   ipratropium-albuterol 0.5-2.5 (3) MG/3ML Soln Commonly known as:  DUONEB Take 3 mLs by nebulization every 4 (four) hours as needed. Inhale contents of vial into the mouth via HHN   lisinopril 2.5 MG tablet Commonly known as:  PRINIVIL,ZESTRIL Take 2.5 mg by mouth daily.   magnesium oxide 400 MG tablet Commonly known as:  MAG-OX Take 400 mg by mouth daily.   metolazone 2.5 MG tablet Commonly known as:  ZAROXOLYN Take 1 tablet (2.5 mg total) by mouth 2 (two) times daily.   OLANZapine 2.5 MG tablet Commonly known as:  ZYPREXA Take 2.5 mg by mouth at bedtime.   potassium chloride 20 MEQ packet Commonly known as:  KLOR-CON Take 80 mEq by mouth 2 (two) times daily.   potassium chloride SA 20 MEQ tablet Commonly known as:  K-DUR,KLOR-CON Take 40 mEq by mouth daily. At 12 pm   PROCEL Powd Take 1 scoop by mouth 2 (two) times daily.   rivaroxaban 20 MG Tabs tablet Commonly known as:  XARELTO Take 20 mg by mouth daily with supper.   SYSTANE OP Place 1 drop into both eyes 2 (two) times daily. For dry eyes        Physical Exam: Vitals:    11/02/16 1205  BP: 127/74  Pulse: 72  Resp: 18  Temp: 98 F (36.7 C)  TempSrc: Oral  SpO2: 97%  Weight: 181 lb (82.1 kg)  Height: 5\' 4"  (1.626 m)  Body mass index is 31.07 kg/m.  General- elderly female, obese, in no acute distress Head- normocephalic, atraumatic Throat- moist mucus membrane, has denutres Eyes- no pallor, no icterus, no discharge Neck- no cervical lymphadenopathy Cardiovascular- irregular heart rate, no murmur, no leg edema Respiratory- CTAB, no wheeze, no rhonchi, no crackles, no use of accessory muscles Abdomen- bowel sounds present, soft, non tender Musculoskeletal- generalized weakness in lower  extremities, on wheelchair, kyphosis +, gets OOB daily, limited right shoulder ROM Neurological- alert and oriented to person, place and time Skin- warm and dry Psychiatry- calm with normal affect   Labs reviewed: Basic Metabolic Panel:  Recent Labs  09/06/16 09/09/16 09/22/16  NA 140 140 140  K 5.3 4.9 3.9  BUN 43* 28* 26*  CREATININE 1.0 0.8 0.9   Liver Function Tests:  Recent Labs  12/24/15 08/23/16  AST 19 18  ALT 15 14  ALKPHOS 85 100   No results for input(s): LIPASE, AMYLASE in the last 8760 hours. No results for input(s): AMMONIA in the last 8760 hours. CBC:  Recent Labs  11/24/15 02/23/16 06/25/16 09/22/16  WBC 6.2 7.1 7.9 5.2  NEUTROABS 3 4 6   --   HGB 10.6* 10.9* 10.4* 9.4*  HCT 34* 35* 33* 30*  PLT 279 308 287 368      Assessment/Plan  Chronic diastolic chf No signs of fluid overload. Continue lasix 60 mg and and 40 mg pm daily, metolazone 2.5 mg bid, lisinopril 2.5 mg daily and continue kcl and check bmp periodically. monitor for signs of fluid overload  Hypokalemia Stable k level, continue kcl supplement  OA right shoulder Continue tylenol 500 mg tid and monitor. Continue calcium and vitamin d supplement  COPD Stable, no recent exacerbation. Continue advair bid and prn duoneb.   Chronic atrial fibrillation Rate  controlled. continue Cardizem 180 mg daily and Xarelto 20 mg daily for stroke prophylaxis.   Hypomagnesemia Continue magnesium oxide supplement. Check mg level  Schizophrenia Stable behavior, continue her olanzapine, dose has been decreased and mood appears stable. followed by psych service   Lab- magnesium level, bmp   Family/ staff Communication: reviewed care plan with patient and nursing supervisor    Blanchie Serve, MD  [facility] [phone] (Monday-Friday 8 am - 5 pm) [phone] (afterhours)

## 2018-01-25 ENCOUNTER — Encounter: Payer: Self-pay | Admitting: Internal Medicine

## 2019-08-13 DEATH — deceased
